# Patient Record
Sex: Male | Born: 1950
Health system: Southern US, Community
[De-identification: ages and names within clinical notes are randomized; demographics above are authoritative.]

## PROBLEM LIST (undated history)

## (undated) DIAGNOSIS — M1990 Unspecified osteoarthritis, unspecified site: Secondary | ICD-10-CM

## (undated) DIAGNOSIS — J439 Emphysema, unspecified: Secondary | ICD-10-CM

## (undated) DIAGNOSIS — C801 Malignant (primary) neoplasm, unspecified: Secondary | ICD-10-CM

## (undated) DIAGNOSIS — I4891 Unspecified atrial fibrillation: Secondary | ICD-10-CM

## (undated) DIAGNOSIS — I10 Essential (primary) hypertension: Secondary | ICD-10-CM

## (undated) DIAGNOSIS — R0600 Dyspnea, unspecified: Secondary | ICD-10-CM

## (undated) HISTORY — PX: KNEE ARTHROSCOPY: SHX127

## (undated) HISTORY — PX: TOE SURGERY: SHX1073

## (undated) HISTORY — PX: CATARACT EXTRACTION W/ INTRAOCULAR LENS IMPLANT: SHX1309

## (undated) HISTORY — PX: TONSILLECTOMY: SUR1361

---
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
## 2000-02-08 ENCOUNTER — Ambulatory Visit (HOSPITAL_COMMUNITY): Admission: RE | Admit: 2000-02-08 | Discharge: 2000-02-08 | Payer: Self-pay | Admitting: Family Medicine

## 2000-02-08 ENCOUNTER — Encounter: Payer: Self-pay | Admitting: Family Medicine

## 2007-01-22 ENCOUNTER — Encounter: Admission: RE | Admit: 2007-01-22 | Discharge: 2007-01-22 | Payer: Self-pay | Admitting: Family Medicine

## 2007-02-04 ENCOUNTER — Encounter: Admission: RE | Admit: 2007-02-04 | Discharge: 2007-02-04 | Payer: Self-pay | Admitting: Family Medicine

## 2009-05-30 ENCOUNTER — Encounter: Admission: RE | Admit: 2009-05-30 | Discharge: 2009-05-30 | Payer: Self-pay | Admitting: Family Medicine

## 2014-03-09 ENCOUNTER — Ambulatory Visit (HOSPITAL_COMMUNITY): Payer: 59 | Attending: Internal Medicine | Admitting: Cardiology

## 2014-03-09 ENCOUNTER — Other Ambulatory Visit (HOSPITAL_COMMUNITY): Payer: Self-pay | Admitting: Cardiology

## 2014-03-09 DIAGNOSIS — M79606 Pain in leg, unspecified: Secondary | ICD-10-CM

## 2014-03-09 DIAGNOSIS — M7989 Other specified soft tissue disorders: Secondary | ICD-10-CM

## 2014-03-09 DIAGNOSIS — Z720 Tobacco use: Secondary | ICD-10-CM | POA: Insufficient documentation

## 2014-03-09 DIAGNOSIS — E785 Hyperlipidemia, unspecified: Secondary | ICD-10-CM | POA: Diagnosis not present

## 2014-03-09 DIAGNOSIS — M79605 Pain in left leg: Secondary | ICD-10-CM

## 2014-03-09 DIAGNOSIS — I10 Essential (primary) hypertension: Secondary | ICD-10-CM | POA: Insufficient documentation

## 2014-03-09 NOTE — Progress Notes (Signed)
Bilateral lower venous duplex performed  

## 2017-07-28 DIAGNOSIS — Z720 Tobacco use: Secondary | ICD-10-CM

## 2017-07-28 DIAGNOSIS — I10 Essential (primary) hypertension: Secondary | ICD-10-CM | POA: Diagnosis present

## 2017-07-28 DIAGNOSIS — M1712 Unilateral primary osteoarthritis, left knee: Secondary | ICD-10-CM | POA: Diagnosis present

## 2017-07-28 DIAGNOSIS — E785 Hyperlipidemia, unspecified: Secondary | ICD-10-CM | POA: Diagnosis present

## 2017-07-28 DIAGNOSIS — Z87891 Personal history of nicotine dependence: Secondary | ICD-10-CM | POA: Diagnosis present

## 2017-07-28 NOTE — H&P (Addendum)
KNEE ARTHROPLASTY ADMISSION H&P  Patient ID: Joe Cole MRN: [id number] DOB/AGE: 1950-05-22 67 y.o.  Chief Complaint: left knee pain.  Planned Procedure Date: 08/19/2017 Medical Clearance by Dr. Kenton Kingfisher Cardiac Clearance by Dr. Wynonia Lawman   HPI: Joe Cole is a 67 y.o. male smoker with a history of HTN and HLD who presents for evaluation of OA LEFT KNEE. The patient has a history of pain and functional disability in the left knee due to arthritis and has failed non-surgical conservative treatments for greater than 12 weeks to include NSAID's and/or analgesics, corticosteriod injections, viscosupplementation injections and activity modification.  Onset of symptoms was gradual, starting 6 years ago with gradually worsening course since that time.  Patient currently rates pain at 7 out of 10 with activity. Patient has night pain, worsening of pain with activity and weight bearing and pain that interferes with activities of daily living.  Patient has evidence of subchondral cysts, subchondral sclerosis, periarticular osteophytes and joint space narrowing by imaging studies.  There is no active infection.  Past medical history: Hypertension, hyperlipidemia, current smoker.   Allergies: NKDA  Medications:  Losartan HCTZ 100-25 daily Atorvastatin 20 mg daily Amlodapine 2.5 mg daily  Social History:  Married, current every day smoker-one pack per day times 40 years.  2 beers several times per week.  Works in [employer].  Family History: Mother with HTN.  ROS: Currently denies lightheadedness, dizziness, Fever, chills, CP, SOB.   No personal history of DVT, PE, MI, or CVA. No loose teeth or dentures All other systems have been reviewed and were otherwise currently negative with the exception of those mentioned in the HPI and as above.  Objective: Vitals: Ht: 5 feet 10 inches wt: 166 temp: 98.3 BP: 167/83 pulse: 88 O2 99% on room air.   Physical Exam: General: Alert, NAD.   Antalgic Gait  HEENT: EOMI, Good Neck Extension  Pulm: No increased work of breathing.  Clear B/L A/P w/o crackle or wheeze.  CV: RRR, No m/g/r appreciated  GI: soft, NT, ND Neuro: Neuro without gross focal deficit.  Sensation intact distally Skin: No lesions in the area of chief complaint MSK/Surgical Site: Left knee w/o redness or effusion.  + JLT. ROM 5-115.  5/5 strength in extension and flexion.  +EHL/FHL.  NVI.  Stable varus and valgus stress.    Imaging Review Plain radiographs demonstrate severe degenerative joint disease of the left knee.    Preoperative templating of the joint replacement has been completed, documented, and submitted to the Operating Room personnel in order to optimize intra-operative equipment management.  Assessment: OA LEFT KNEE Principal Problem:   Primary osteoarthritis of left knee Active Problems:   Essential hypertension   Hyperlipidemia   Tobacco abuse disorder   Plan: Plan for Procedure(s): TOTAL KNEE ARTHROPLASTY  The patient history, physical exam, clinical judgement of the provider and imaging are consistent with end stage degenerative joint disease and total joint arthroplasty is deemed medically necessary. The treatment options including medical management, injection therapy, and arthroplasty were discussed at length. The risks and benefits of Procedure(s): TOTAL KNEE ARTHROPLASTY were presented and reviewed.  The risks of nonoperative treatment, versus surgical intervention including but not limited to continued pain, aseptic loosening, stiffness, dislocation/subluxation, infection, bleeding, nerve injury, blood clots, cardiopulmonary complications, morbidity, mortality, among others were discussed. The patient verbalizes understanding and wishes to proceed with the plan.  Patient is being admitted for inpatient treatment for surgery, pain control, PT, OT, prophylactic antibiotics, VTE prophylaxis, progressive ambulation,  ADL's and discharge  planning.   Dental prophylaxis discussed and recommended for 2 years postoperatively.   The patient does meet the criteria for TXA which will be used perioperatively.    ASA 325 mg will be used postoperatively for DVT prophylaxis in addition to SCDs, and early ambulation.   The patient is planning to be discharged home with home health services ([facility]) in care of his wife.   Patient's anticipated LOS is less than 2 midnights, meeting these requirements: - Lives within 1 hour of care - Has a competent adult at home to recover with post-op recover - NO history of  - Chronic pain requiring opiods  - Diabetes  - Coronary Artery Disease  - Heart failure  - Heart attack  - Stroke  - DVT/VTE  - Cardiac arrhythmia  - Respiratory Failure/COPD  - Renal failure  - Anemia  - Advanced Liver disease   Prudencio Burly III, PA-C 07/28/2017 4:01 PM

## 2017-08-05 NOTE — Pre-Procedure Instructions (Signed)
Joe Cole  08/05/2017      [id number] - [place], [name] - [address] [name] [address] Phone: [phone] Fax: [phone]    Your procedure is scheduled on Tuesday, April 23.  Report to [facility] Admitting at 5:30 AM                Your surgery or procedure is scheduled for 7:30 AM   Call this number if you have problems the morning of surgery: [phone]                 For any other questions, please call [phone], Monday - Friday 8 AM - 4 PM.     Remember:  Do not eat food or drink liquids after midnight Monday, April 22.  Take these medicines the morning of surgery with A SIP OF WATER : amLODipine (NORVASC) atorvastatin (LIPITOR)  1 Week prior to surgery STOP taking Aspirin, Aspirin Products (Goody Powder, Excedrin Migraine), Ibuprofen (Advil), Naproxen (Aleve), Vitamins and Herbal Products (ie Fish Oil).  Special instructions:   Joe Cole- Preparing For Surgery  Before surgery, you can play an important role. Because skin is not sterile, your skin needs to be as free of germs as possible. You can reduce the number of germs on your skin by washing with CHG (chlorahexidine gluconate) Soap before surgery.  CHG is an antiseptic cleaner which kills germs and bonds with the skin to continue killing germs even after washing.  Please do not use if you have an allergy to CHG or antibacterial soaps. If your skin becomes reddened/irritated stop using the CHG.  Do not shave (including legs and underarms) for at least 48 hours prior to first CHG shower. It is OK to shave your face.  Please follow these instructions carefully.   1. Shower the NIGHT BEFORE SURGERY and the MORNING OF SURGERY with CHG.   2. If you chose to wash your hair, wash your hair first as usual with your normal shampoo.  3. After you shampoo, rinse your hair and body thoroughly to remove the shampoo.                      Wash Face and genitals (private parts)  with your normal soap, then rinse.  4.Use CHG as you would any other liquid soap. You can apply CHG directly to the skin and wash gently with a scrungie or a clean washcloth.   4. Apply the CHG Soap to your body ONLY FROM THE NECK DOWN.  Do not use on open wounds or open sores. Avoid contact with your eyes, ears, mouth and genitals (private parts).   5. Wash thoroughly, paying special attention to the area where your surgery will be performed.  6. Thoroughly rinse your body with warm water from the neck down.  7. DO NOT shower/wash with your normal soap after using and rinsing off the CHG Soap.  8. Pat yourself dry with a CLEAN TOWEL.  9. Wear CLEAN PAJAMAS to bed the night before surgery, wear comfortable clothes the morning of surgery  10. Place CLEAN SHEETS on your bed the night of your first shower and DO NOT SLEEP WITH PETS.  Day of Surgery: Shower as above. Do not apply any deodorants/lotions, powders or colognes. Please wear clean clothes to the hospital/surgery center.    Do not wear jewelry, make-up or nail polish.  Do  not shave 48 hours prior to surgery.  Men may shave face and neck.  Do not bring valuables to the hospital.  [facility] is not responsible for any belongings or valuables.  Contacts, dentures or bridgework may not be worn into surgery.  Leave your suitcase in the car.  After surgery it may be brought to your room.  For patients admitted to the hospital, discharge time will be determined by your treatment team.  Patients discharged the day of surgery will not be allowed to drive home.   Please read over the following fact sheets that you were given.

## 2017-08-06 ENCOUNTER — Encounter (HOSPITAL_COMMUNITY): Payer: Self-pay

## 2017-08-06 ENCOUNTER — Encounter (HOSPITAL_COMMUNITY)
Admission: RE | Admit: 2017-08-06 | Discharge: 2017-08-06 | Disposition: A | Discharge status: Home / Self Care | Payer: Medicare Other | Source: Ambulatory Visit | Attending: Orthopedic Surgery | Admitting: Orthopedic Surgery

## 2017-08-06 ENCOUNTER — Other Ambulatory Visit: Payer: Self-pay

## 2017-08-06 DIAGNOSIS — Z01818 Encounter for other preprocedural examination: Secondary | ICD-10-CM | POA: Insufficient documentation

## 2017-08-06 DIAGNOSIS — M1712 Unilateral primary osteoarthritis, left knee: Secondary | ICD-10-CM | POA: Diagnosis not present

## 2017-08-06 HISTORY — DX: Unspecified osteoarthritis, unspecified site: M19.90

## 2017-08-06 HISTORY — DX: Essential (primary) hypertension: I10

## 2017-08-06 LAB — CBC
HEMATOCRIT: 41.2 % (ref 39.0–52.0)
HEMOGLOBIN: 14.8 g/dL (ref 13.0–17.0)
MCH: 37 pg — AB (ref 26.0–34.0)
MCHC: 35.9 g/dL (ref 30.0–36.0)
MCV: 103 fL — AB (ref 78.0–100.0)
Platelets: 198 10*3/uL (ref 150–400)
RBC: 4 MIL/uL — ABNORMAL LOW (ref 4.22–5.81)
RDW: 13 % (ref 11.5–15.5)
WBC: 4.3 10*3/uL (ref 4.0–10.5)

## 2017-08-06 LAB — BASIC METABOLIC PANEL
ANION GAP: 12 (ref 5–15)
BUN: 10 mg/dL (ref 6–20)
CHLORIDE: 93 mmol/L — AB (ref 101–111)
CO2: 24 mmol/L (ref 22–32)
Calcium: 9.7 mg/dL (ref 8.9–10.3)
Creatinine, Ser: 0.7 mg/dL (ref 0.61–1.24)
GFR calc Af Amer: >60 mL/min (ref >60)
GFR calc non Af Amer: >60 mL/min (ref >60)
GLUCOSE: 90 mg/dL (ref 65–99)
POTASSIUM: 4.8 mmol/L (ref 3.5–5.1)
Sodium: 129 mmol/L — ABNORMAL LOW (ref 135–145)

## 2017-08-06 LAB — SURGICAL PCR SCREEN
MRSA, PCR: NEGATIVE
STAPHYLOCOCCUS AUREUS: POSITIVE — AB

## 2017-08-06 NOTE — Pre-Procedure Instructions (Signed)
Joe Cole  08/06/2017      [id number] - [place], [place] - [address] [place] [address] Phone: [phone] Fax: [phone]    Your procedure is scheduled on Tuesday, April 23.  Report to [facility] Admitting at 10:30 AM                Your surgery or procedure is scheduled for 7:30 AM   Call this number if you have problems the morning of surgery: [phone]                 For any other questions, please call [phone], Monday - Friday 8 AM - 4 PM.     Remember:  Do not eat food or drink liquids after midnight Monday, April 22.  Take these medicines the morning of surgery with A SIP OF WATER : amLODipine (NORVASC)   1 Week prior to surgery STOP taking Aspirin, Aspirin Products (Goody Powder, Excedrin Migraine), Ibuprofen (Advil), Naproxen (Aleve), Vitamins and Herbal Products (ie Fish Oil).  Special instructions:   - Preparing For Surgery  Before surgery, you can play an important role. Because skin is not sterile, your skin needs to be as free of germs as possible. You can reduce the number of germs on your skin by washing with CHG (chlorahexidine gluconate) Soap before surgery.  CHG is an antiseptic cleaner which kills germs and bonds with the skin to continue killing germs even after washing.  Please do not use if you have an allergy to CHG or antibacterial soaps. If your skin becomes reddened/irritated stop using the CHG.  Do not shave (including legs and underarms) for at least 48 hours prior to first CHG shower. It is OK to shave your face.  Please follow these instructions carefully.   1. Shower the NIGHT BEFORE SURGERY and the MORNING OF SURGERY with CHG.   2. If you chose to wash your hair, wash your hair first as usual with your normal shampoo.  3. After you shampoo, rinse your hair and body thoroughly to remove the shampoo.   Wash Face and genitals (private parts)  with your normal soap, then rinse.  4.Use CHG as you would any other liquid soap. You can apply CHG directly to the skin and wash gently with a scrungie or a clean washcloth.   4. Apply the CHG Soap to your body ONLY FROM THE NECK DOWN.  Do not use on open wounds or open sores. Avoid contact with your eyes, ears, mouth and genitals (private parts).   5. Wash thoroughly, paying special attention to the area where your surgery will be performed.  6. Thoroughly rinse your body with warm water from the neck down.  7. DO NOT shower/wash with your normal soap after using and rinsing off the CHG Soap.  8. Pat yourself dry with a CLEAN TOWEL.  9. Wear CLEAN PAJAMAS to bed the night before surgery, wear comfortable clothes the morning of surgery  10. Place CLEAN SHEETS on your bed the night of your first shower and DO NOT SLEEP WITH PETS.  Day of Surgery: Shower as above. Do not apply any deodorants/lotions, powders or colognes. Please wear clean clothes to the hospital/surgery center.    Do not wear jewelry, make-up or nail polish.  Do not shave 48 hours prior to surgery.  Men may shave face and neck.  Do not bring valuables to  the hospital.  [facility] is not responsible for any belongings or valuables.  Contacts, dentures or bridgework may not be worn into surgery.  Leave your suitcase in the car.  After surgery it may be brought to your room.  For patients admitted to the hospital, discharge time will be determined by your treatment team.  Patients discharged the day of surgery will not be allowed to drive home.   Please read over the following fact sheets that you were given.

## 2017-08-06 NOTE — Progress Notes (Signed)
REQUESTED EKG FROM DR. TILLEY'S OFFICE.

## 2017-08-08 NOTE — Progress Notes (Signed)
Anesthesia Chart Review:  Pt is a 67 year old male scheduled for L total knee arthroplasty on 08/19/2017 with Edmonia Lynch, MD  - PCP is [name], MD who cleared pt for surgery.  - Saw cardiologist Tollie Eth, MD 06/23/17 for pre-op eval and was cleared for surgery without further testing.   PMH includes:  HTN, hyperlipidemia. Current smoker. BMI 22.  Medications include: amlodipine, lipitor, losartan-hctz  BP (!) 175/82   Pulse 90   Temp 36.6 C   Resp 20   Ht 6\' 1"  (1.854 m)   Wt 168 lb 1.6 oz (76.2 kg)   SpO2 100%   BMI 22.18 kg/m   Preoperative labs reviewed.   - Na 129, Cl 93.  I notified Claiborne Billings in Dr. Debroah Loop office.  Will recheck Na day of surgery.   EKG 06/23/17 (Dr. Thurman Coyer office): Sinus rhythm. Vertical axis.   If labs acceptable day of surgery, I anticipate pt can proceed with surgery as scheduled.   Willeen Cass, FNP-BC [facility] Short Stay Surgical Center/Anesthesiology Phone: [phone] 08/08/2017 1:11 PM

## 2017-08-18 MED ORDER — GABAPENTIN 300 MG PO CAPS
300.0000 mg | ORAL_CAPSULE | Freq: Once | ORAL | Status: AC
  Administered 2017-08-19: 300 mg via ORAL
  Filled 2017-08-18: qty 1

## 2017-08-18 MED ORDER — BUPIVACAINE LIPOSOME 1.3 % IJ SUSP
20.0000 mL | Freq: Once | INTRAMUSCULAR | Status: DC
  Filled 2017-08-18: qty 20

## 2017-08-18 MED ORDER — ACETAMINOPHEN 500 MG PO TABS
1000.0000 mg | ORAL_TABLET | Freq: Once | ORAL | Status: AC
  Administered 2017-08-19: 1000 mg via ORAL
  Filled 2017-08-18: qty 2

## 2017-08-18 MED ORDER — CEFAZOLIN SODIUM-DEXTROSE 2-4 GM/100ML-% IV SOLN
2.0000 g | INTRAVENOUS | Status: AC
  Administered 2017-08-19: 2 g via INTRAVENOUS
  Filled 2017-08-18: qty 100

## 2017-08-18 MED ORDER — LACTATED RINGERS IV SOLN: INTRAVENOUS | Status: DC

## 2017-08-18 MED ORDER — SODIUM CHLORIDE 0.9 % IV SOLN
1000.0000 mg | INTRAVENOUS | Status: AC
  Administered 2017-08-19: 1000 mg via INTRAVENOUS
  Filled 2017-08-18: qty 1100

## 2017-08-19 ENCOUNTER — Observation Stay (HOSPITAL_COMMUNITY): Payer: Medicare Other

## 2017-08-19 ENCOUNTER — Ambulatory Visit (HOSPITAL_COMMUNITY): Payer: Medicare Other | Admitting: Certified Registered"

## 2017-08-19 ENCOUNTER — Other Ambulatory Visit: Payer: Self-pay

## 2017-08-19 ENCOUNTER — Encounter (HOSPITAL_COMMUNITY): Payer: Self-pay | Admitting: *Deleted

## 2017-08-19 ENCOUNTER — Ambulatory Visit (HOSPITAL_COMMUNITY): Payer: Medicare Other | Admitting: Emergency Medicine

## 2017-08-19 ENCOUNTER — Encounter (HOSPITAL_COMMUNITY)
Admission: RE | Disposition: A | Discharge status: Home / Self Care | Payer: Self-pay | Source: Ambulatory Visit | Attending: Orthopedic Surgery

## 2017-08-19 ENCOUNTER — Observation Stay (HOSPITAL_COMMUNITY)
Admission: RE | Admit: 2017-08-19 | Discharge: 2017-08-20 | Disposition: A | Discharge status: Home / Self Care | Payer: Medicare Other | Source: Ambulatory Visit | Attending: Orthopedic Surgery | Admitting: Orthopedic Surgery

## 2017-08-19 DIAGNOSIS — E785 Hyperlipidemia, unspecified: Secondary | ICD-10-CM | POA: Diagnosis not present

## 2017-08-19 DIAGNOSIS — I10 Essential (primary) hypertension: Secondary | ICD-10-CM | POA: Diagnosis not present

## 2017-08-19 DIAGNOSIS — F17210 Nicotine dependence, cigarettes, uncomplicated: Secondary | ICD-10-CM | POA: Diagnosis not present

## 2017-08-19 DIAGNOSIS — Z720 Tobacco use: Secondary | ICD-10-CM

## 2017-08-19 DIAGNOSIS — M1712 Unilateral primary osteoarthritis, left knee: Principal | ICD-10-CM | POA: Insufficient documentation

## 2017-08-19 DIAGNOSIS — Z96659 Presence of unspecified artificial knee joint: Secondary | ICD-10-CM

## 2017-08-19 DIAGNOSIS — Z79899 Other long term (current) drug therapy: Secondary | ICD-10-CM | POA: Insufficient documentation

## 2017-08-19 DIAGNOSIS — Z87891 Personal history of nicotine dependence: Secondary | ICD-10-CM | POA: Diagnosis present

## 2017-08-19 HISTORY — PX: TOTAL KNEE ARTHROPLASTY: SHX125

## 2017-08-19 LAB — POCT I-STAT 4, (NA,K, GLUC, HGB,HCT)
Glucose, Bld: 102 mg/dL — ABNORMAL HIGH (ref 65–99)
HEMATOCRIT: 41 % (ref 39.0–52.0)
HEMOGLOBIN: 13.9 g/dL (ref 13.0–17.0)
Potassium: 4.2 mmol/L (ref 3.5–5.1)
Sodium: 132 mmol/L — ABNORMAL LOW (ref 135–145)

## 2017-08-19 LAB — GLUCOSE, CAPILLARY: Glucose-Capillary: 156 mg/dL — ABNORMAL HIGH (ref 65–99)

## 2017-08-19 SURGERY — ARTHROPLASTY, KNEE, TOTAL
Anesthesia: Monitor Anesthesia Care | Site: Knee | Laterality: Left

## 2017-08-19 MED ORDER — SUCCINYLCHOLINE CHLORIDE 200 MG/10ML IV SOSY
PREFILLED_SYRINGE | INTRAVENOUS | Status: AC
  Filled 2017-08-19: qty 10

## 2017-08-19 MED ORDER — METHOCARBAMOL 500 MG PO TABS: 500.0000 mg | ORAL_TABLET | Freq: Four times a day (QID) | ORAL | 0 refills | Status: DC | PRN

## 2017-08-19 MED ORDER — CEFAZOLIN SODIUM-DEXTROSE 2-4 GM/100ML-% IV SOLN
2.0000 g | Freq: Four times a day (QID) | INTRAVENOUS | Status: AC
  Administered 2017-08-19 (×2): 2 g via INTRAVENOUS
  Filled 2017-08-19 (×2): qty 100

## 2017-08-19 MED ORDER — METOCLOPRAMIDE HCL 5 MG PO TABS: 5.0000 mg | ORAL_TABLET | Freq: Three times a day (TID) | ORAL | Status: DC | PRN

## 2017-08-19 MED ORDER — LIDOCAINE 2% (20 MG/ML) 5 ML SYRINGE
INTRAMUSCULAR | Status: AC
  Filled 2017-08-19: qty 5

## 2017-08-19 MED ORDER — METHOCARBAMOL 1000 MG/10ML IJ SOLN
500.0000 mg | Freq: Four times a day (QID) | INTRAVENOUS | Status: DC | PRN
  Filled 2017-08-19: qty 5

## 2017-08-19 MED ORDER — DEXAMETHASONE SODIUM PHOSPHATE 10 MG/ML IJ SOLN
INTRAMUSCULAR | Status: AC
  Filled 2017-08-19: qty 1

## 2017-08-19 MED ORDER — ONDANSETRON HCL 4 MG PO TABS: 4.0000 mg | ORAL_TABLET | Freq: Three times a day (TID) | ORAL | 0 refills | Status: DC | PRN

## 2017-08-19 MED ORDER — SUGAMMADEX SODIUM 200 MG/2ML IV SOLN
INTRAVENOUS | Status: AC
  Filled 2017-08-19: qty 2

## 2017-08-19 MED ORDER — SODIUM CHLORIDE 0.9 % IR SOLN
Status: DC | PRN
  Administered 2017-08-19: 3000 mL

## 2017-08-19 MED ORDER — DIPHENHYDRAMINE HCL 12.5 MG/5ML PO ELIX: 12.5000 mg | ORAL_SOLUTION | ORAL | Status: DC | PRN

## 2017-08-19 MED ORDER — MAGNESIUM CITRATE PO SOLN: 1.0000 | Freq: Once | ORAL | Status: DC | PRN

## 2017-08-19 MED ORDER — PHENYLEPHRINE 40 MCG/ML (10ML) SYRINGE FOR IV PUSH (FOR BLOOD PRESSURE SUPPORT)
PREFILLED_SYRINGE | INTRAVENOUS | Status: AC
  Filled 2017-08-19: qty 10

## 2017-08-19 MED ORDER — BUPIVACAINE LIPOSOME 1.3 % IJ SUSP
INTRAMUSCULAR | Status: DC | PRN
  Administered 2017-08-19: 20 mL

## 2017-08-19 MED ORDER — OMEPRAZOLE 20 MG PO CPDR: 20.0000 mg | DELAYED_RELEASE_CAPSULE | Freq: Every day | ORAL | 0 refills | Status: DC

## 2017-08-19 MED ORDER — EPHEDRINE 5 MG/ML INJ
INTRAVENOUS | Status: AC
  Filled 2017-08-19: qty 10

## 2017-08-19 MED ORDER — SODIUM CHLORIDE 0.9% FLUSH
INTRAVENOUS | Status: DC | PRN
  Administered 2017-08-19: 30 mL

## 2017-08-19 MED ORDER — PROPOFOL 10 MG/ML IV BOLUS
INTRAVENOUS | Status: AC
  Filled 2017-08-19: qty 20

## 2017-08-19 MED ORDER — CHLORHEXIDINE GLUCONATE 4 % EX LIQD: 60.0000 mL | Freq: Once | CUTANEOUS | Status: DC

## 2017-08-19 MED ORDER — BUPIVACAINE IN DEXTROSE 0.75-8.25 % IT SOLN
INTRATHECAL | Status: DC | PRN
  Administered 2017-08-19: 2 mL via INTRATHECAL

## 2017-08-19 MED ORDER — FENTANYL CITRATE (PF) 100 MCG/2ML IJ SOLN: 50.0000 ug | Freq: Once | INTRAMUSCULAR | Status: DC

## 2017-08-19 MED ORDER — LOSARTAN POTASSIUM-HCTZ 100-25 MG PO TABS: 1.0000 | ORAL_TABLET | Freq: Every day | ORAL | Status: DC

## 2017-08-19 MED ORDER — ONDANSETRON HCL 4 MG/2ML IJ SOLN
INTRAMUSCULAR | Status: AC
  Filled 2017-08-19: qty 2

## 2017-08-19 MED ORDER — MENTHOL 3 MG MT LOZG: 1.0000 | LOZENGE | OROMUCOSAL | Status: DC | PRN

## 2017-08-19 MED ORDER — OXYCODONE HCL 5 MG PO TABS
5.0000 mg | ORAL_TABLET | ORAL | Status: DC | PRN
  Administered 2017-08-19 – 2017-08-20 (×5): 10 mg via ORAL
  Filled 2017-08-19 (×5): qty 2

## 2017-08-19 MED ORDER — LOSARTAN POTASSIUM 50 MG PO TABS
100.0000 mg | ORAL_TABLET | Freq: Every day | ORAL | Status: DC
  Administered 2017-08-19 – 2017-08-20 (×2): 100 mg via ORAL
  Filled 2017-08-19 (×2): qty 2

## 2017-08-19 MED ORDER — ROPIVACAINE HCL 7.5 MG/ML IJ SOLN
INTRAMUSCULAR | Status: DC | PRN
  Administered 2017-08-19: 20 mL via PERINEURAL

## 2017-08-19 MED ORDER — POLYETHYLENE GLYCOL 3350 17 G PO PACK: 17.0000 g | PACK | Freq: Every day | ORAL | Status: DC | PRN

## 2017-08-19 MED ORDER — ROCURONIUM BROMIDE 10 MG/ML (PF) SYRINGE
PREFILLED_SYRINGE | INTRAVENOUS | Status: AC
Start: 2017-08-19 — End: 2017-08-19
  Filled 2017-08-19: qty 5

## 2017-08-19 MED ORDER — FENTANYL CITRATE (PF) 250 MCG/5ML IJ SOLN
INTRAMUSCULAR | Status: AC
  Filled 2017-08-19: qty 5

## 2017-08-19 MED ORDER — AMLODIPINE BESYLATE 2.5 MG PO TABS
2.5000 mg | ORAL_TABLET | Freq: Every day | ORAL | Status: DC
  Administered 2017-08-20: 2.5 mg via ORAL
  Filled 2017-08-19: qty 1

## 2017-08-19 MED ORDER — ACETAMINOPHEN 500 MG PO TABS
1000.0000 mg | ORAL_TABLET | Freq: Three times a day (TID) | ORAL | Status: DC
  Administered 2017-08-19 – 2017-08-20 (×3): 1000 mg via ORAL
  Filled 2017-08-19 (×3): qty 2

## 2017-08-19 MED ORDER — FENTANYL CITRATE (PF) 100 MCG/2ML IJ SOLN
50.0000 ug | Freq: Once | INTRAMUSCULAR | Status: AC
  Administered 2017-08-19: 50 ug via INTRAVENOUS

## 2017-08-19 MED ORDER — LACTATED RINGERS IV SOLN
INTRAVENOUS | Status: DC
  Administered 2017-08-19: 08:00:00 via INTRAVENOUS

## 2017-08-19 MED ORDER — DEXAMETHASONE SODIUM PHOSPHATE 10 MG/ML IJ SOLN: 10.0000 mg | Freq: Once | INTRAMUSCULAR | Status: DC

## 2017-08-19 MED ORDER — ACETAMINOPHEN 325 MG PO TABS: 325.0000 mg | ORAL_TABLET | Freq: Four times a day (QID) | ORAL | Status: DC | PRN

## 2017-08-19 MED ORDER — POVIDONE-IODINE 10 % EX SWAB
2.0000 "application " | Freq: Once | CUTANEOUS | Status: AC
  Administered 2017-08-19: 2 via TOPICAL

## 2017-08-19 MED ORDER — SORBITOL 70 % SOLN: 30.0000 mL | Freq: Every day | Status: DC | PRN

## 2017-08-19 MED ORDER — FENTANYL CITRATE (PF) 100 MCG/2ML IJ SOLN
INTRAMUSCULAR | Status: AC
  Administered 2017-08-19: 50 ug via INTRAVENOUS
  Filled 2017-08-19: qty 2

## 2017-08-19 MED ORDER — ONDANSETRON HCL 4 MG/2ML IJ SOLN: 4.0000 mg | Freq: Four times a day (QID) | INTRAMUSCULAR | Status: DC | PRN

## 2017-08-19 MED ORDER — HYDROCHLOROTHIAZIDE 25 MG PO TABS
25.0000 mg | ORAL_TABLET | Freq: Every day | ORAL | Status: DC
  Administered 2017-08-19 – 2017-08-20 (×2): 25 mg via ORAL
  Filled 2017-08-19 (×2): qty 1

## 2017-08-19 MED ORDER — PHENOL 1.4 % MT LIQD: 1.0000 | OROMUCOSAL | Status: DC | PRN

## 2017-08-19 MED ORDER — HYDROMORPHONE HCL 2 MG/ML IJ SOLN: 0.5000 mg | INTRAMUSCULAR | Status: DC | PRN

## 2017-08-19 MED ORDER — METOCLOPRAMIDE HCL 5 MG/ML IJ SOLN: 5.0000 mg | Freq: Three times a day (TID) | INTRAMUSCULAR | Status: DC | PRN

## 2017-08-19 MED ORDER — PROPOFOL 500 MG/50ML IV EMUL
INTRAVENOUS | Status: DC | PRN
  Administered 2017-08-19: 75 ug/kg/min via INTRAVENOUS

## 2017-08-19 MED ORDER — ACETAMINOPHEN 500 MG PO TABS: 1000.0000 mg | ORAL_TABLET | Freq: Three times a day (TID) | ORAL | 0 refills | Status: AC

## 2017-08-19 MED ORDER — DOCUSATE SODIUM 100 MG PO CAPS
100.0000 mg | ORAL_CAPSULE | Freq: Two times a day (BID) | ORAL | Status: DC
  Administered 2017-08-19 – 2017-08-20 (×3): 100 mg via ORAL
  Filled 2017-08-19 (×3): qty 1

## 2017-08-19 MED ORDER — CELECOXIB 200 MG PO CAPS
200.0000 mg | ORAL_CAPSULE | Freq: Two times a day (BID) | ORAL | Status: DC
  Administered 2017-08-19 – 2017-08-20 (×3): 200 mg via ORAL
  Filled 2017-08-19 (×3): qty 1

## 2017-08-19 MED ORDER — ASPIRIN EC 325 MG PO TBEC
325.0000 mg | DELAYED_RELEASE_TABLET | Freq: Every day | ORAL | Status: DC
  Administered 2017-08-20: 325 mg via ORAL
  Filled 2017-08-19: qty 1

## 2017-08-19 MED ORDER — MIDAZOLAM HCL 2 MG/2ML IJ SOLN
INTRAMUSCULAR | Status: AC
  Filled 2017-08-19: qty 2

## 2017-08-19 MED ORDER — MIDAZOLAM HCL 2 MG/2ML IJ SOLN
INTRAMUSCULAR | Status: AC
  Administered 2017-08-19: 2 mg via INTRAVENOUS
  Filled 2017-08-19: qty 2

## 2017-08-19 MED ORDER — DOCUSATE SODIUM 100 MG PO CAPS: 100.0000 mg | ORAL_CAPSULE | Freq: Two times a day (BID) | ORAL | 0 refills | Status: DC

## 2017-08-19 MED ORDER — ASPIRIN EC 325 MG PO TBEC: 325.0000 mg | DELAYED_RELEASE_TABLET | Freq: Every day | ORAL | 0 refills | Status: DC

## 2017-08-19 MED ORDER — OXYCODONE HCL 5 MG PO TABS: 5.0000 mg | ORAL_TABLET | ORAL | 0 refills | Status: AC | PRN

## 2017-08-19 MED ORDER — LACTATED RINGERS IV SOLN
INTRAVENOUS | Status: DC
  Administered 2017-08-19: 14:00:00 via INTRAVENOUS
  Administered 2017-08-19: 125 mL via INTRAVENOUS
  Administered 2017-08-20: 06:00:00 via INTRAVENOUS

## 2017-08-19 MED ORDER — MIDAZOLAM HCL 2 MG/2ML IJ SOLN
2.0000 mg | Freq: Once | INTRAMUSCULAR | Status: AC
  Administered 2017-08-19: 2 mg via INTRAVENOUS

## 2017-08-19 MED ORDER — ATORVASTATIN CALCIUM 20 MG PO TABS
20.0000 mg | ORAL_TABLET | Freq: Every day | ORAL | Status: DC
  Administered 2017-08-19 – 2017-08-20 (×2): 20 mg via ORAL
  Filled 2017-08-19 (×2): qty 1

## 2017-08-19 MED ORDER — CELECOXIB 200 MG PO CAPS: 200.0000 mg | ORAL_CAPSULE | Freq: Two times a day (BID) | ORAL | 0 refills | Status: DC

## 2017-08-19 MED ORDER — ONDANSETRON HCL 4 MG/2ML IJ SOLN
INTRAMUSCULAR | Status: DC | PRN
  Administered 2017-08-19: 4 mg via INTRAVENOUS

## 2017-08-19 MED ORDER — ONDANSETRON HCL 4 MG PO TABS: 4.0000 mg | ORAL_TABLET | Freq: Four times a day (QID) | ORAL | Status: DC | PRN

## 2017-08-19 MED ORDER — METHOCARBAMOL 500 MG PO TABS
500.0000 mg | ORAL_TABLET | Freq: Four times a day (QID) | ORAL | Status: DC | PRN
  Administered 2017-08-19 – 2017-08-20 (×4): 500 mg via ORAL
  Filled 2017-08-19 (×4): qty 1

## 2017-08-19 MED ORDER — 0.9 % SODIUM CHLORIDE (POUR BTL) OPTIME
TOPICAL | Status: DC | PRN
  Administered 2017-08-19: 1000 mL

## 2017-08-19 SURGICAL SUPPLY — 61 items
BANDAGE ESMARK 6X9 LF (GAUZE/BANDAGES/DRESSINGS) ×1 IMPLANT
BEARIN INSERT TIBIAL SZ6 9 (Orthopedic Implant) ×2 IMPLANT
BEARING INSERT TIBIAL SZ6 9 (Orthopedic Implant) IMPLANT
BLADE SAG 18X100X1.27 (BLADE) ×4 IMPLANT
BNDG CMPR 9X6 STRL LF SNTH (GAUZE/BANDAGES/DRESSINGS) ×1
BNDG CMPR MED 10X6 ELC LF (GAUZE/BANDAGES/DRESSINGS) ×1
BNDG CMPR MED 15X6 ELC VLCR LF (GAUZE/BANDAGES/DRESSINGS) ×1
BNDG ELASTIC 6X10 VLCR STRL LF (GAUZE/BANDAGES/DRESSINGS) ×2 IMPLANT
BNDG ELASTIC 6X15 VLCR STRL LF (GAUZE/BANDAGES/DRESSINGS) ×2 IMPLANT
BNDG ESMARK 6X9 LF (GAUZE/BANDAGES/DRESSINGS) ×2
BOWL SMART MIX CTS (DISPOSABLE) ×2 IMPLANT
BSPLAT TIB 6 KN TRITANIUM (Knees) ×1 IMPLANT
CLSR STERI-STRIP ANTIMIC 1/2X4 (GAUZE/BANDAGES/DRESSINGS) ×4 IMPLANT
COVER SURGICAL LIGHT HANDLE (MISCELLANEOUS) ×2 IMPLANT
CUFF TOURNIQUET SINGLE 34IN LL (TOURNIQUET CUFF) ×2 IMPLANT
DRAPE EXTREMITY T 121X128X90 (DRAPE) ×2 IMPLANT
DRAPE HALF SHEET 40X57 (DRAPES) ×2 IMPLANT
DRAPE IMP U-DRAPE 54X76 (DRAPES) ×2 IMPLANT
DRAPE U-SHAPE 47X51 STRL (DRAPES) ×2 IMPLANT
DRSG MEPILEX BORDER 4X12 (GAUZE/BANDAGES/DRESSINGS) ×2 IMPLANT
DRSG MEPILEX BORDER 4X8 (GAUZE/BANDAGES/DRESSINGS) ×2 IMPLANT
DURAPREP 26ML APPLICATOR (WOUND CARE) ×4 IMPLANT
ELECT CAUTERY BLADE 6.4 (BLADE) ×2 IMPLANT
ELECT REM PT RETURN 9FT ADLT (ELECTROSURGICAL) ×2
ELECTRODE REM PT RTRN 9FT ADLT (ELECTROSURGICAL) ×1 IMPLANT
FACESHIELD WRAPAROUND (MASK) ×4 IMPLANT
FACESHIELD WRAPAROUND OR TEAM (MASK) ×2 IMPLANT
FEMORAL POSTERIOR SZ6 LFT (Femur) IMPLANT
GLOVE BIO SURGEON STRL SZ7.5 (GLOVE) ×4 IMPLANT
GLOVE BIOGEL PI IND STRL 8 (GLOVE) ×2 IMPLANT
GLOVE BIOGEL PI INDICATOR 8 (GLOVE) ×2
GOWN STRL REUS W/ TWL LRG LVL3 (GOWN DISPOSABLE) ×2 IMPLANT
GOWN STRL REUS W/ TWL XL LVL3 (GOWN DISPOSABLE) ×1 IMPLANT
GOWN STRL REUS W/TWL LRG LVL3 (GOWN DISPOSABLE) ×4
GOWN STRL REUS W/TWL XL LVL3 (GOWN DISPOSABLE) ×2
HANDPIECE INTERPULSE COAX TIP (DISPOSABLE) ×2
IMMOBILIZER KNEE 22 UNIV (SOFTGOODS) ×2 IMPLANT
KIT BASIN OR (CUSTOM PROCEDURE TRAY) ×2 IMPLANT
KIT TURNOVER KIT B (KITS) ×2 IMPLANT
KNEE PATELLA ASYMMETRIC 10X32 (Knees) ×1 IMPLANT
KNEE TIBIAL COMPONENT SZ6 (Knees) ×1 IMPLANT
MANIFOLD NEPTUNE II (INSTRUMENTS) ×2 IMPLANT
NEEDLE 22X1 1/2 (OR ONLY) (NEEDLE) ×2 IMPLANT
NS IRRIG 1000ML POUR BTL (IV SOLUTION) ×2 IMPLANT
PACK TOTAL JOINT (CUSTOM PROCEDURE TRAY) ×2 IMPLANT
PAD ARMBOARD 7.5X6 YLW CONV (MISCELLANEOUS) ×2 IMPLANT
POSTERIOR FEMORAL SZ6 LFT (Femur) ×2 IMPLANT
SET HNDPC FAN SPRY TIP SCT (DISPOSABLE) IMPLANT
SUCTION FRAZIER HANDLE 10FR (MISCELLANEOUS) ×1
SUCTION TUBE FRAZIER 10FR DISP (MISCELLANEOUS) ×1 IMPLANT
SUT MNCRL AB 4-0 PS2 18 (SUTURE) ×2 IMPLANT
SUT MON AB 2-0 CT1 27 (SUTURE) ×2 IMPLANT
SUT VIC AB 0 CT1 27 (SUTURE) ×2
SUT VIC AB 0 CT1 27XBRD ANBCTR (SUTURE) ×1 IMPLANT
SUT VIC AB 1 CT1 27 (SUTURE) ×4
SUT VIC AB 1 CT1 27XBRD ANBCTR (SUTURE) ×2 IMPLANT
SYR 50ML LL SCALE MARK (SYRINGE) ×2 IMPLANT
TOWEL OR 17X24 6PK STRL BLUE (TOWEL DISPOSABLE) ×2 IMPLANT
TOWEL OR 17X26 10 PK STRL BLUE (TOWEL DISPOSABLE) ×2 IMPLANT
TRAY CATH 16FR W/PLASTIC CATH (SET/KITS/TRAYS/PACK) ×1 IMPLANT
TRAY FOLEY BAG SILVER LF 14FR (CATHETERS) IMPLANT

## 2017-08-19 NOTE — Progress Notes (Signed)
Orthopedic Tech Progress Note Patient Details:  Joe Cole 09-Apr-1951 [id number]  CPM Left Knee CPM Left Knee: On Left Knee Flexion (Degrees): 80 Left Knee Extension (Degrees): 0  Post Interventions Patient Tolerated: Well Instructions Provided: Care of device  Hildred Priest 08/19/2017, 2:27 PM Placed pt's lle on cpm @ 0-80 degrees @1425 ; RN notified ; pt will increase as tolerated; RN notified

## 2017-08-19 NOTE — Discharge Instructions (Signed)

## 2017-08-19 NOTE — Anesthesia Procedure Notes (Signed)
Spinal  Patient location during procedure: OR Start time: 08/19/2017 9:04 AM End time: 08/19/2017 9:09 AM Staffing Anesthesiologist: Oleta Mouse, MD Preanesthetic Checklist Completed: patient identified, surgical consent, pre-op evaluation, timeout performed, IV checked, risks and benefits discussed and monitors and equipment checked Spinal Block Patient position: sitting Prep: site prepped and draped and DuraPrep Patient monitoring: heart rate, cardiac monitor, continuous pulse ox and blood pressure Approach: midline Location: L4-5 Injection technique: single-shot Needle Needle type: Pencan  Needle gauge: 24 G Needle length: 10 cm Assessment Sensory level: T4

## 2017-08-19 NOTE — Progress Notes (Signed)
RN was helping pt to bathroom when pt started to become unsteady, so RN sat pt on bed where he became unresponsive and clammy. RN did sternal rub and laid pt down. Rapid was called. Pt began to come back to, answering questions appropriately and stating that he "feels better." BP was 141/83, HR 71, 02 sating at 95% on room air. Roxan Hockey PA made aware, instructed to check CBG, CBG checked and was 156 and to check BP before ambulating again. Pt is now resting comfortably in bed. Wife in room and consoled by nurse. Will continue to monitor.

## 2017-08-19 NOTE — Interval H&P Note (Signed)
History and Physical Interval Note:  08/19/2017 7:20 AM  Joe Cole  has presented today for surgery, with the diagnosis of OA LEFT KNEE  The various methods of treatment have been discussed with the patient and family. After consideration of risks, benefits and other options for treatment, the patient has consented to  Procedure(s): TOTAL KNEE ARTHROPLASTY (Left) as a surgical intervention .  The patient's history has been reviewed, patient examined, no change in status, stable for surgery.  I have reviewed the patient's chart and labs.  Questions were answered to the patient's satisfaction.     [name] D

## 2017-08-19 NOTE — Anesthesia Procedure Notes (Signed)
Procedure Name: MAC Date/Time: 08/19/2017 9:05 AM Performed by: Leonor Liv, CRNA Pre-anesthesia Checklist: Emergency Drugs available, Suction available, Patient being monitored, Timeout performed and Patient identified Oxygen Delivery Method: Nasal cannula Placement Confirmation: positive ETCO2

## 2017-08-19 NOTE — Transfer of Care (Signed)
Immediate Anesthesia Transfer of Care Note  Patient: Joe Cole  Procedure(s) Performed: TOTAL KNEE ARTHROPLASTY (Left Knee)  Patient Location: PACU  Anesthesia Type:Spinal  Level of Consciousness: awake, alert  and oriented  Airway & Oxygen Therapy: Patient Spontanous Breathing and Patient connected to nasal cannula oxygen  Post-op Assessment: Report given to RN, Post -op Vital signs reviewed and stable and Patient moving all extremities  Post vital signs: Reviewed and stable  Last Vitals:  Vitals Value Taken Time  BP 144/89 08/19/2017 11:25 AM  Temp    Pulse 80 08/19/2017 11:27 AM  Resp 16 08/19/2017 11:27 AM  SpO2 100 % 08/19/2017 11:27 AM  Vitals shown include unvalidated device data.  Last Pain:  Vitals:   08/19/17 0735  TempSrc:   PainSc: 7       Patients Stated Pain Goal: 3 (16/24/46 9507)  Complications: No apparent anesthesia complications

## 2017-08-19 NOTE — Anesthesia Procedure Notes (Signed)
Anesthesia Regional Block: Adductor canal block   Pre-Anesthetic Checklist: ,, timeout performed, Correct Patient, Correct Site, Correct Laterality, Correct Procedure, Correct Position, site marked, Risks and benefits discussed,  Surgical consent,  Pre-op evaluation,  At surgeon's request and post-op pain management  Laterality: Lower and Left  Prep: chloraprep       Needles:  Injection technique: Single-shot  Needle Type: Echogenic Stimulator Needle          Additional Needles:   Procedures:,,,, ultrasound used (permanent image in chart),,,,  Narrative:  Start time: 08/19/2017 8:14 AM End time: 08/19/2017 8:17 AM Injection made incrementally with aspirations every 5 mL.  Performed by: Personally  Anesthesiologist: Oleta Mouse, MD  Additional Notes: H+P and labs reviewed, risks and benefits discussed with patient, procedure tolerated well without complications

## 2017-08-19 NOTE — Op Note (Signed)
DATE OF SURGERY:  08/19/2017 TIME: 10:21 AM  PATIENT NAME:  Joe Cole   AGE: 67 y.o.    PRE-OPERATIVE DIAGNOSIS:  OA LEFT KNEE  POST-OPERATIVE DIAGNOSIS:  Same  PROCEDURE:  Procedure(s): TOTAL KNEE ARTHROPLASTY   SURGEON:  MURPHY, TIMOTHY D, MD   ASSISTANT:  Roxan Hockey, PA-C, he was present and scrubbed throughout the case, critical for completion in a timely fashion, and for retraction, instrumentation, and closure.    OPERATIVE IMPLANTS: Stryker Triathlon Posterior Stabilized. Press fit knee  Femur size 6, Tibia size 6, Patella size 32 3-peg oval button, with a 9 mm polyethylene insert.   PREOPERATIVE INDICATIONS:  Joe Cole is a 67 y.o. year old male with end stage bone on bone degenerative arthritis of the knee who failed conservative treatment, including injections, antiinflammatories, activity modification, and assistive devices, and had significant impairment of their activities of daily living, and elected for Total Knee Arthroplasty.   The risks, benefits, and alternatives were discussed at length including but not limited to the risks of infection, bleeding, nerve injury, stiffness, blood clots, the need for revision surgery, cardiopulmonary complications, among others, and they were willing to proceed.   OPERATIVE DESCRIPTION:  The patient was brought to the operative room and placed in a supine position.  General anesthesia was administered.  IV antibiotics were given.  The lower extremity was prepped and draped in the usual sterile fashion.  Time out was performed.  The leg was elevated and exsanguinated and the tourniquet was inflated.  Anterior approach was performed.  The patella was everted and osteophytes were removed.  The anterior horn of the medial and lateral meniscus was removed.   The distal femur was opened with the drill and the intramedullary distal femoral cutting jig was utilized, set at 5 degrees resecting 8 mm off the distal femur.   Care was taken to protect the collateral ligaments.  The distal femoral sizing jig was applied, taking care to avoid notching.  Then the 4-in-1 cutting jig was applied and the anterior and posterior femur was cut, along with the chamfer cuts.  All posterior osteophytes were removed.  The flexion gap was then measured and was symmetric with the extension gap.  Then the extramedullary tibial cutting jig was utilized making the appropriate cut using the anterior tibial crest as a reference building in appropriate posterior slope.  Care was taken during the cut to protect the medial and collateral ligaments.  The proximal tibia was removed along with the posterior horns of the menisci.  The PCL was sacrificed.    The extensor gap was measured and was approximately 49mm.    I completed the distal femoral preparation using the appropriate jig to prepare the box.  The patella was then measured, and cut with the saw.    The proximal tibia sized and prepared accordingly with the reamer and the punch, and then all components were trialed with the above sized poly insert.  The knee was found to have excellent balance and full motion.    The above named components were then impacted into place and Poly tibial piece and patella were inserted.  I was very happy with his stability and ROM  I performed a periarticular injection with marcaine and toradol  The knee was easily taken through a range of motion and the patella tracked well and the knee irrigated copiously and the parapatellar and subcutaneous tissue closed with vicryl, and monocryl with steri strips for the skin.  The incision was dressed with sterile gauze and the tourniquet released and the patient was awakened and returned to the PACU in stable and satisfactory condition.  There were no complications.  Total tourniquet time was roughly 60 minutes.   POSTOPERATIVE PLAN: post op Abx, DVT px: SCD's, TED's, Early ambulation and chemical  px

## 2017-08-19 NOTE — Anesthesia Preprocedure Evaluation (Signed)
Anesthesia Evaluation  Patient identified by MRN, date of birth, ID band Patient awake    Reviewed: Allergy & Precautions, NPO status , Patient's Chart, lab work & pertinent test results  History of Anesthesia Complications Negative for: history of anesthetic complications  Airway Mallampati: II  TM Distance: >3 FB Neck ROM: Full    Dental  (+) Loose, Poor Dentition,    Pulmonary neg shortness of breath, neg COPD, neg recent URI, Current Smoker,    breath sounds clear to auscultation       Cardiovascular hypertension, Pt. on medications (-) angina(-) Past MI and (-) CHF  Rhythm:Regular     Neuro/Psych negative neurological ROS  negative psych ROS   GI/Hepatic negative GI ROS, Neg liver ROS,   Endo/Other    Renal/GU negative Renal ROS     Musculoskeletal  (+) Arthritis ,   Abdominal   Peds  Hematology negative hematology ROS (+)   Anesthesia Other Findings   Reproductive/Obstetrics                             Anesthesia Physical Anesthesia Plan  ASA: II  Anesthesia Plan: MAC, Regional and Spinal   Post-op Pain Management:    Induction:   PONV Risk Score and Plan: 0  Airway Management Planned: Nasal Cannula  Additional Equipment: None  Intra-op Plan:   Post-operative Plan:   Informed Consent: I have reviewed the patients History and Physical, chart, labs and discussed the procedure including the risks, benefits and alternatives for the proposed anesthesia with the patient or authorized representative who has indicated his/her understanding and acceptance.   Dental advisory given  Plan Discussed with: CRNA and Surgeon  Anesthesia Plan Comments:         Anesthesia Quick Evaluation

## 2017-08-19 NOTE — Evaluation (Signed)
Physical Therapy Evaluation Patient Details Name: Joe Cole MRN: [id number] DOB: 29-Jul-1950 Today's Date: 08/19/2017   History of Present Illness  Pt is a 67 y/o male s/p elective L TKA. PMH includes HTN and tobacco use.   Clinical Impression  Pt is s/p surgery above with deficits below. Gait distance limited secondary to pain. Required min A for mobility with RW. Reviewed knee precautions and supine HEP. Will continue to follow acutely to maximize functional mobility independence and safety.     Follow Up Recommendations Follow surgeon's recommendation for DC plan and follow-up therapies;Supervision for mobility/OOB    Equipment Recommendations  None recommended by PT    Recommendations for Other Services OT consult     Precautions / Restrictions Precautions Precautions: Knee Precaution Booklet Issued: Yes (comment) Precaution Comments: Reviewed supine HEP And knee precautions with pt.  Restrictions Weight Bearing Restrictions: Yes LLE Weight Bearing: Weight bearing as tolerated      Mobility  Bed Mobility Overal bed mobility: Needs Assistance Bed Mobility: Supine to Sit     Supine to sit: Supervision     General bed mobility comments: Supervision for safety.   Transfers Overall transfer level: Needs assistance Equipment used: Rolling walker (2 wheeled) Transfers: Sit to/from Stand Sit to Stand: Min assist         General transfer comment: Min A for lift assist and steadying assist. Verbal cues for safe hand placement.   Ambulation/Gait Ambulation/Gait assistance: Min assist Ambulation Distance (Feet): 5 Feet Assistive device: Rolling walker (2 wheeled) Gait Pattern/deviations: Step-to pattern;Decreased step length - right;Decreased step length - left;Decreased weight shift to left;Antalgic Gait velocity: Decreased  Gait velocity interpretation: <1.31 ft/sec, indicative of household ambulator General Gait Details: Slow, antalgic gait. Pt also with  slight knee buckling even with use of KI. Distance limited to chair secondary to pain. Verbal cues for sequencing using RW.   Stairs            Wheelchair Mobility    Modified Rankin (Stroke Patients Only)       Balance Overall balance assessment: Needs assistance Sitting-balance support: No upper extremity supported;Feet supported Sitting balance-Leahy Scale: Good     Standing balance support: Bilateral upper extremity supported;During functional activity Standing balance-Leahy Scale: Poor Standing balance comment: Reliant on BUE support.                              Pertinent Vitals/Pain Pain Assessment: 0-10 Pain Score: 4  Pain Location: L knee  Pain Descriptors / Indicators: Aching;Operative site guarding Pain Intervention(s): Limited activity within patient's tolerance;Monitored during session;Repositioned    Home Living Family/patient expects to be discharged to:: Private residence Living Arrangements: Spouse/significant other Available Help at Discharge: Family;Available 24 hours/day Type of Home: House Home Access: Stairs to enter Entrance Stairs-Rails: Right Entrance Stairs-Number of Steps: 4 Home Layout: One level Home Equipment: Walker - 2 wheels;Bedside commode      Prior Function Level of Independence: Independent               Hand Dominance        Extremity/Trunk Assessment   Upper Extremity Assessment Upper Extremity Assessment: Defer to OT evaluation    Lower Extremity Assessment Lower Extremity Assessment: LLE deficits/detail LLE Deficits / Details: Reports some decreased sensation. Able to perform ther ex below. Deficits consistent with post op pain and weakness.     Cervical / Trunk Assessment Cervical / Trunk Assessment: Normal  Communication  Communication: No difficulties  Cognition Arousal/Alertness: Awake/alert Behavior During Therapy: WFL for tasks assessed/performed Overall Cognitive Status: Within  Functional Limits for tasks assessed                                        General Comments General comments (skin integrity, edema, etc.): Pt's wife present during session.     Exercises Total Joint Exercises Ankle Circles/Pumps: AROM;Both;20 reps Quad Sets: AROM;Left;10 reps Heel Slides: AROM;Left;10 reps   Assessment/Plan    PT Assessment Patient needs continued PT services  PT Problem List Decreased strength;Decreased balance;Decreased activity tolerance;Decreased mobility;Decreased knowledge of use of DME;Decreased knowledge of precautions;Pain;Impaired sensation       PT Treatment Interventions DME instruction;Gait training;Stair training;Functional mobility training;Therapeutic activities;Therapeutic exercise;Neuromuscular re-education;Balance training;Patient/family education    PT Goals (Current goals can be found in the Care Plan section)  Acute Rehab PT Goals Patient Stated Goal: to go home  PT Goal Formulation: With patient Time For Goal Achievement: 09/02/17 Potential to Achieve Goals: Good    Frequency 7X/week   Barriers to discharge        Co-evaluation               AM-PAC PT "6 Clicks" Daily Activity  Outcome Measure Difficulty turning over in bed (including adjusting bedclothes, sheets and blankets)?: A Little Difficulty moving from lying on back to sitting on the side of the bed? : A Little Difficulty sitting down on and standing up from a chair with arms (e.g., wheelchair, bedside commode, etc,.)?: Unable Help needed moving to and from a bed to chair (including a wheelchair)?: A Little Help needed walking in hospital room?: A Little Help needed climbing 3-5 steps with a railing? : A Lot 6 Click Score: 15    End of Session Equipment Utilized During Treatment: Gait belt;Left knee immobilizer Activity Tolerance: Patient limited by pain Patient left: in chair;with call bell/phone within reach;with family/visitor present Nurse  Communication: Mobility status PT Visit Diagnosis: Other abnormalities of gait and mobility (R26.89);Pain;Unsteadiness on feet (R26.81) Pain - Right/Left: Left Pain - part of body: Knee    Time: 5643-3295 PT Time Calculation (min) (ACUTE ONLY): 23 min   Charges:   PT Evaluation $PT Eval Low Complexity: 1 Low PT Treatments $Therapeutic Activity: 8-22 mins   PT G Codes:        Leighton Ruff, PT, DPT  Acute Rehabilitation Services  Pager: [phone]   Rudean Hitt 08/19/2017, 3:54 PM

## 2017-08-19 NOTE — Progress Notes (Signed)
Patient S/P Left total knee arthroplasty earlier today.  Up x1 with therapy, tolerated well.  Patient OOB ambulating with RN to bathroom when he became syncopal.  Upon my arrival patient lying in bed fully alter and oriented.  Denies CP, SOB or dizziness.  141/83  HR 71.  RN to notify MD of event.  No RRT interventions.  RN to call if assistance needed.

## 2017-08-20 ENCOUNTER — Encounter (HOSPITAL_COMMUNITY): Payer: Self-pay | Admitting: Orthopedic Surgery

## 2017-08-20 DIAGNOSIS — M1712 Unilateral primary osteoarthritis, left knee: Secondary | ICD-10-CM | POA: Diagnosis not present

## 2017-08-20 LAB — POCT I-STAT 4, (NA,K, GLUC, HGB,HCT)
Glucose, Bld: 97 mg/dL (ref 65–99)
HCT: 43 % (ref 39.0–52.0)
Hemoglobin: 14.6 g/dL (ref 13.0–17.0)
POTASSIUM: 7.1 mmol/L — AB (ref 3.5–5.1)
SODIUM: 129 mmol/L — AB (ref 135–145)

## 2017-08-20 MED ORDER — DEXAMETHASONE SODIUM PHOSPHATE 10 MG/ML IJ SOLN
10.0000 mg | Freq: Once | INTRAMUSCULAR | Status: AC
  Administered 2017-08-20: 10 mg via INTRAVENOUS
  Filled 2017-08-20: qty 1

## 2017-08-20 NOTE — Progress Notes (Signed)
    Subjective: Patient reports pain as mild to moderate.  Tolerating diet.  Urinating.   No CP, SOB.  OOB with therapy.  Objective:   VITALS:   Vitals:   08/19/17 1614 08/19/17 1820 08/19/17 1950 08/20/17 0328  BP: (!) 141/83 (!) 84/49 126/72 131/76  Pulse: 71 65 72 64  Resp:   16 16  Temp:   97.6 F (36.4 C) 97.9 F (36.6 C)  TempSrc:   Oral Oral  SpO2: 95%  99% 100%  Weight:      Height:       CBC Latest Ref Rng & Units 08/19/2017 08/19/2017 08/06/2017  WBC 4.0 - 10.5 K/uL - - 4.3  Hemoglobin 13.0 - 17.0 g/dL 13.9 14.6 14.8  Hematocrit 39.0 - 52.0 % 41.0 43.0 41.2  Platelets 150 - 400 K/uL - - 198   BMP Latest Ref Rng & Units 08/19/2017 08/19/2017 08/06/2017  Glucose 65 - 99 mg/dL 102(H) 97 90  BUN 6 - 20 mg/dL - - 10  Creatinine 0.61 - 1.24 mg/dL - - 0.70  Sodium 135 - 145 mmol/L 132(L) 129(L) 129(L)  Potassium 3.5 - 5.1 mmol/L 4.2 7.1(HH) 4.8  Chloride 101 - 111 mmol/L - - 93(L)  CO2 22 - 32 mmol/L - - 24  Calcium 8.9 - 10.3 mg/dL - - 9.7   Intake/Output      04/23 0701 - 04/24 0700 04/24 0701 - 04/25 0700   P.O. 120    I.V. (mL/kg) 1429.2 (18.8)    IV Piggyback 100    Total Intake(mL/kg) 1649.2 (21.6)    Urine (mL/kg/hr) 750 (0.4)    Blood 20    Total Output 770    Net +879.2            Physical Exam: General: NAD.  Supine in bed.  CPM in place.  Calm, conversant.  Wife at bedside. Resp: No increased wob Cardio: regular rate and rhythm ABD soft Neurologically intact MSK Neurovascularly intact Sensation intact distally Intact pulses distally Dorsiflexion/Plantar flexion intact Incision: dressing C/D/I   Assessment: 1 Day Post-Op  S/P Procedure(s) (LRB): TOTAL KNEE ARTHROPLASTY (Left) by Dr. Ernesta Amble. Murphy on 08/19/2017  Principal Problem:   Primary osteoarthritis of left knee Active Problems:   Essential hypertension   Hyperlipidemia   Tobacco abuse disorder   Osteoarthritis of left knee   Primary osteoarthritis, status post left total  knee arthroplasty Doing well postop day 1. Early postop syncopal episode yesterday noted.  He has since recovered well and is asymptomatic. Eating, drinking, and voiding. Pain controlled. Good early mobilization  Plan: Advance diet Up with therapy D/C IV fluids Incentive Spirometry Elevate and Apply ice CPM, bone foam  Weight Bearing: Weight Bearing as Tolerated (WBAT)  Dressings: Maintain Mepilex.  Please apply thigh high TED hose to operative leg prior to discharge. VTE prophylaxis: Aspirin, SCDs, ambulation Dispo: Home today after therapy session.   Charna Elizabeth Martensen III, PA-C 08/20/2017, 7:42 AM

## 2017-08-20 NOTE — Care Management Note (Signed)
Case Management Note  Patient Details  Name: Joe Cole MRN: [id number] Date of Birth: 10-23-1950  Subjective/Objective:  67 yr old male s/p left total knee arthroplasty.                  Action/Plan: Patient was preoperatively setup with [facility], no changes,. Will have support at discharge.    Expected Discharge Date:  08/20/17               Expected Discharge Plan:  [place]  In-House Referral:  NA  Discharge planning Services  CM Consult  Post Acute Care Choice:  Durable Medical Equipment, Home Health Choice offered to:  Patient  DME Arranged:  3-N-1, CPM, Walker rolling DME Agency:  [employer]  HH Arranged:  PT [place]:  [facility] [employer]  Status of Service:  Completed, signed off  If discussed at [employer], dates discussed:    Additional Comments:  Ninfa Meeker, RN 08/20/2017, 2:16 PM

## 2017-08-20 NOTE — Anesthesia Postprocedure Evaluation (Signed)
Anesthesia Post Note  Patient: Joe Cole  Procedure(s) Performed: TOTAL KNEE ARTHROPLASTY (Left Knee)     Patient location during evaluation: PACU Anesthesia Type: Regional, MAC and Spinal Level of consciousness: awake and alert Pain management: pain level controlled Vital Signs Assessment: post-procedure vital signs reviewed and stable Respiratory status: spontaneous breathing, nonlabored ventilation, respiratory function stable and patient connected to nasal cannula oxygen Cardiovascular status: stable and blood pressure returned to baseline Postop Assessment: no apparent nausea or vomiting and spinal receding Anesthetic complications: no    Last Vitals:  Vitals:   08/19/17 1950 08/20/17 0328  BP: 126/72 131/76  Pulse: 72 64  Resp: 16 16  Temp: 36.4 C 36.6 C  SpO2: 99% 100%    Last Pain:  Vitals:   08/20/17 1209  TempSrc:   PainSc: 5                  Carlotta Telfair

## 2017-08-20 NOTE — Plan of Care (Signed)
  Problem: Education: Goal: Knowledge of the prescribed therapeutic regimen will improve Outcome: Adequate for Discharge   Problem: Activity: Goal: Ability to avoid complications of mobility impairment will improve Outcome: Completed/Met Goal: Range of joint motion will improve Outcome: Adequate for Discharge   Problem: Clinical Measurements: Goal: Postoperative complications will be avoided or minimized Outcome: Adequate for Discharge   Problem: Pain Management: Goal: Pain level will decrease with appropriate interventions Outcome: Adequate for Discharge   Problem: Skin Integrity: Goal: Signs of wound healing will improve Outcome: Adequate for Discharge

## 2017-08-20 NOTE — Progress Notes (Signed)
Pt given discharge instructions and prescriptions. Instructions gone over with him and wife present and answered all questions to satisfaction. All belongings gathered to be sent home with pt. Pt in no distress at time of discharge.

## 2017-08-20 NOTE — Progress Notes (Addendum)
Physical Therapy Treatment Patient Details Name: Joe Cole MRN: [id number] DOB: 03-Apr-1951 Today's Date: 08/20/2017    History of Present Illness Pt is a 67 y/o male s/p elective L TKA. PMH includes HTN and tobacco use.     PT Comments    Pt performed gait and stair training in review for d/c home this pm.  Pt with improved safety during gait training and remains to require cues for safety with transfer.  After review patient verbalizes and demonstrated understanding.  He is safe to d/c home at this time with support from spouse.     Follow Up Recommendations  Follow surgeon's recommendation for DC plan and follow-up therapies;Supervision for mobility/OOB     Equipment Recommendations  None recommended by PT    Recommendations for Other Services       Precautions / Restrictions Precautions Precautions: Knee Precaution Booklet Issued: Yes (comment) Precaution Comments: Reviewed supine HEP And knee precautions with pt.  Restrictions Weight Bearing Restrictions: Yes LLE Weight Bearing: Weight bearing as tolerated    Mobility  Bed Mobility Overal bed mobility: Needs Assistance Bed Mobility: Supine to Sit     Supine to sit: Supervision     General bed mobility comments: Supervision for safety.   Transfers Overall transfer level: Needs assistance Equipment used: Rolling walker (2 wheeled) Transfers: Sit to/from Stand Sit to Stand: Supervision         General transfer comment: Cues for hand placement to push from seated surface.  Pt intially pulling on RW into standing.    Ambulation/Gait Ambulation/Gait assistance: Min assist Ambulation Distance (Feet): 200 Feet Assistive device: Rolling walker (2 wheeled) Gait Pattern/deviations: Step-to pattern;Decreased step length - right;Decreased step length - left;Decreased weight shift to left;Antalgic Gait velocity: Decreased    General Gait Details: Pt remains slow and antalgic, cues for soft landing of R foot.   Knee immobilizer removed for afternoon session.     Stairs Stairs: Yes Stairs assistance: Min guard Stair Management: One rail Right;Forwards Number of Stairs: 4 General stair comments: Pt able to recall and perform sequencing without cues and decreased assistance.     Wheelchair Mobility    Modified Rankin (Stroke Patients Only)       Balance Overall balance assessment: Needs assistance   Sitting balance-Leahy Scale: Good       Standing balance-Leahy Scale: Poor Standing balance comment: Reliant on BUE support.                             Cognition Arousal/Alertness: Awake/alert Behavior During Therapy: WFL for tasks assessed/performed Overall Cognitive Status: Within Functional Limits for tasks assessed                                        Exercises Deferred therapeutic exercises as OT arrived.    General Comments        Pertinent Vitals/Pain Pain Assessment: 0-10 Pain Score: 5  Pain Location: L knee  Pain Descriptors / Indicators: Aching;Operative site guarding Pain Intervention(s): Monitored during session;Repositioned(deferred ice pack as OT arrived.  )    Home Living                      Prior Function            PT Goals (current goals can now be found in the care plan  section) Acute Rehab PT Goals Patient Stated Goal: to go home  Potential to Achieve Goals: Good Progress towards PT goals: Progressing toward goals    Frequency    7X/week      PT Plan Current plan remains appropriate    Co-evaluation              AM-PAC PT "6 Clicks" Daily Activity  Outcome Measure  Difficulty turning over in bed (including adjusting bedclothes, sheets and blankets)?: A Little Difficulty moving from lying on back to sitting on the side of the bed? : A Little Difficulty sitting down on and standing up from a chair with arms (e.g., wheelchair, bedside commode, etc,.)?: Unable Help needed moving to and from a  bed to chair (including a wheelchair)?: A Little Help needed walking in hospital room?: A Little Help needed climbing 3-5 steps with a railing? : A Little 6 Click Score: 16    End of Session Equipment Utilized During Treatment: Gait belt Activity Tolerance: Patient limited by pain Patient left: in chair;with call bell/phone within reach;with family/visitor present Nurse Communication: Mobility status PT Visit Diagnosis: Other abnormalities of gait and mobility (R26.89);Pain;Unsteadiness on feet (R26.81) Pain - Right/Left: Left Pain - part of body: Knee     Time: 1351-1400 PT Time Calculation (min) (ACUTE ONLY): 9 min  Charges:  $Gait Training: 8-22 mins                    G Codes:       Governor Rooks, PTA pager [phone]    Cristela Blue 08/20/2017, 3:00 PM

## 2017-08-20 NOTE — Discharge Summary (Signed)
Discharge Summary  Patient ID: Joe Cole MRN: [id number] DOB/AGE: 66-01-1951 66 y.o.  Admit date: 08/19/2017 Discharge date: 08/20/2017  Admission Diagnoses:  Primary osteoarthritis of left knee  Discharge Diagnoses:  Principal Problem:   Primary osteoarthritis of left knee Active Problems:   Essential hypertension   Hyperlipidemia   Tobacco abuse disorder   Osteoarthritis of left knee   Past Medical History:  Diagnosis Date  . Arthritis   . Hypertension     Surgeries: Procedure(s): TOTAL KNEE ARTHROPLASTY on 08/19/2017   Consultants (if any):   Discharged Condition: Improved  Hospital Course: Joe Cole is an 67 y.o. male who was admitted 08/19/2017 with a diagnosis of Primary osteoarthritis of left knee and went to the operating room on 08/19/2017 and underwent the above named procedures.    He was given perioperative antibiotics:  Anti-infectives (From admission, onward)   Start     Dose/Rate Route Frequency Ordered Stop   08/19/17 1500  ceFAZolin (ANCEF) IVPB 2g/100 mL premix     2 g 200 mL/hr over 30 Minutes Intravenous Every 6 hours 08/19/17 1310 08/19/17 2139   08/19/17 1130  ceFAZolin (ANCEF) IVPB 2g/100 mL premix     2 g 200 mL/hr over 30 Minutes Intravenous To [facility] 08/18/17 1151 08/19/17 0858    .  He was given sequential compression devices, early ambulation, and aspirin for DVT prophylaxis.  He benefited maximally from the hospital stay and there were no complications.    Recent vital signs:  Vitals:   08/19/17 1950 08/20/17 0328  BP: 126/72 131/76  Pulse: 72 64  Resp: 16 16  Temp: 97.6 F (36.4 C) 97.9 F (36.6 C)  SpO2: 99% 100%    Recent laboratory studies:  Lab Results  Component Value Date   HGB 13.9 08/19/2017   HGB 14.6 08/19/2017   HGB 14.8 08/06/2017   Lab Results  Component Value Date   WBC 4.3 08/06/2017   PLT 198 08/06/2017   No results found for: INR Lab Results  Component Value Date   NA 132  (L) 08/19/2017   K 4.2 08/19/2017   CL 93 (L) 08/06/2017   CO2 24 08/06/2017   BUN 10 08/06/2017   CREATININE 0.70 08/06/2017   GLUCOSE 102 (H) 08/19/2017    Discharge Medications:   Allergies as of 08/20/2017   No Known Allergies     Medication List    STOP taking these medications   ibuprofen 200 MG tablet Commonly known as:  ADVIL,MOTRIN     TAKE these medications   acetaminophen 500 MG tablet Commonly known as:  TYLENOL Take 2 tablets (1,000 mg total) by mouth every 8 (eight) hours for 14 days. For Pain. What changed:    when to take this  reasons to take this  additional instructions   amLODipine 2.5 MG tablet Commonly known as:  NORVASC Take 2.5 mg by mouth daily.   aspirin EC 325 MG tablet Take 1 tablet (325 mg total) by mouth daily. For 30 days post op for DVT Prophylaxis   atorvastatin 20 MG tablet Commonly known as:  LIPITOR Take 20 mg by mouth daily.   celecoxib 200 MG capsule Commonly known as:  CELEBREX Take 1 capsule (200 mg total) by mouth 2 (two) times daily. For 2 weeks post op for pain and inflammation.  Discontinue Ibuprofen or other Anti-inflammatory medicine when taking this medicine.   docusate sodium 100 MG capsule Commonly known as:  COLACE Take 1 capsule (100  mg total) by mouth 2 (two) times daily. To prevent constipation while taking pain medication.   losartan-hydrochlorothiazide 100-25 MG tablet Commonly known as:  HYZAAR Take 1 tablet by mouth daily.   methocarbamol 500 MG tablet Commonly known as:  ROBAXIN Take 1 tablet (500 mg total) by mouth every 6 (six) hours as needed for muscle spasms.   omeprazole 20 MG capsule Commonly known as:  PRILOSEC Take 1 capsule (20 mg total) by mouth daily. 30 days for gastroprotection while taking NSAIDs.   ondansetron 4 MG tablet Commonly known as:  ZOFRAN Take 1 tablet (4 mg total) by mouth every 8 (eight) hours as needed for nausea or vomiting.   oxyCODONE 5 MG immediate release  tablet Commonly known as:  ROXICODONE Take 1 tablet (5 mg total) by mouth every 4 (four) hours as needed for breakthrough pain.       Diagnostic Studies: Dg Knee Left Port  Result Date: 08/19/2017 CLINICAL DATA:  Status post left total knee joint prosthesis placement. EXAM: PORTABLE LEFT KNEE - 1-2 VIEW COMPARISON:  None in PACs FINDINGS: The patient has undergone total knee joint prosthesis placement. Radiographic positioning of the prosthetic components is good. The interface with the native bone is normal. Small amounts of soft tissue gas and fluid are present. IMPRESSION: There is no immediate postprocedure complication following left total knee joint prosthesis placement. Electronically Signed   By: David  Martinique M.D.   On: 08/19/2017 13:51    Disposition: Discharge disposition: 01-Home or Self Care       Discharge Instructions    Discharge patient   Complete by:  As directed    Discharge disposition:  01-Home or Self Care   Discharge patient date:  08/20/2017      Follow-up Information    Renette Butters, MD Follow up.   Specialty:  Orthopedic Surgery Contact information: [address] [id number] [phone]            Signed: Prudencio Burly III PA-C 08/20/2017, 7:45 AM

## 2017-08-20 NOTE — Plan of Care (Signed)
  Problem: Education: Goal: Knowledge of General Education information will improve Outcome: Progressing   Problem: Clinical Measurements: Goal: Ability to maintain clinical measurements within normal limits will improve Outcome: Progressing   Problem: Pain Management: Goal: Pain level will decrease with appropriate interventions Outcome: Progressing

## 2017-08-20 NOTE — Progress Notes (Addendum)
Physical Therapy Treatment Patient Details Name: Joe Cole MRN: [id number] DOB: 04/09/1951 Today's Date: 08/20/2017    History of Present Illness Pt is a 67 y/o male s/p elective L TKA. PMH includes HTN and tobacco use.     PT Comments    Pt reports previous sycopal episode with nursing yesterday.  No complaints of dizziness during session.  Pt required cues for safety to keep feet within RW during turns.  Pt with plans to d/c home this pm.  Will f/u in pm to review exercises, gait training and stair training.      Follow Up Recommendations  Follow surgeon's recommendation for DC plan and follow-up therapies;Supervision for mobility/OOB     Equipment Recommendations  None recommended by PT    Recommendations for Other Services       Precautions / Restrictions Precautions Precautions: Knee Precaution Comments: Reviewed supine HEP And knee precautions with pt.  Restrictions Weight Bearing Restrictions: Yes LLE Weight Bearing: Weight bearing as tolerated    Mobility  Bed Mobility Overal bed mobility: Needs Assistance Bed Mobility: Supine to Sit     Supine to sit: Supervision     General bed mobility comments: Supervision for safety.   Transfers Overall transfer level: Needs assistance Equipment used: Rolling walker (2 wheeled) Transfers: Sit to/from Stand Sit to Stand: Min guard         General transfer comment: Cues for hand placement to push from seated surface.    Ambulation/Gait Ambulation/Gait assistance: Min assist Ambulation Distance (Feet): 100 Feet Assistive device: Rolling walker (2 wheeled) Gait Pattern/deviations: Step-to pattern;Decreased step length - right;Decreased step length - left;Decreased weight shift to left;Antalgic Gait velocity: Decreased    General Gait Details: Pt remains slow and natlgic, cues for soft landing of R foot.  Knee immobilizer donned  pre gait training.     Stairs Stairs: Yes Stairs assistance: Min  assist Stair Management: One rail Right;Forwards Number of Stairs: 4 General stair comments: Cues for sequencing and hand placement on railing.  Pt prefers both hand on R rail for support.  Min assist for safety and to assist in ascending and descending stairs.  Slight posterior lean when descending.     Wheelchair Mobility    Modified Rankin (Stroke Patients Only)       Balance Overall balance assessment: Needs assistance   Sitting balance-Leahy Scale: Good       Standing balance-Leahy Scale: Poor                              Cognition Arousal/Alertness: Awake/alert Behavior During Therapy: WFL for tasks assessed/performed Overall Cognitive Status: Within Functional Limits for tasks assessed                                        Exercises Total Joint Exercises Ankle Circles/Pumps: AROM;Both;20 reps Quad Sets: AROM;Left;10 reps Towel Squeeze: AROM;Left;10 reps;Supine Short Arc Quad: AROM;Left;10 reps;Supine Heel Slides: AROM;Left;10 reps;Supine Straight Leg Raises: AROM;Left;10 reps;Supine Long Arc Quad: AROM;10 reps;Supine;Left Goniometric ROM: 12-81 degrees L knee.      General Comments        Pertinent Vitals/Pain Pain Assessment: 0-10 Pain Score: 8  Pain Location: L knee  Pain Descriptors / Indicators: Aching;Operative site guarding Pain Intervention(s): Monitored during session    Home Living  Prior Function            PT Goals (current goals can now be found in the care plan section) Acute Rehab PT Goals Patient Stated Goal: to go home  Potential to Achieve Goals: Good Progress towards PT goals: Progressing toward goals    Frequency    7X/week      PT Plan Current plan remains appropriate    Co-evaluation              AM-PAC PT "6 Clicks" Daily Activity  Outcome Measure  Difficulty turning over in bed (including adjusting bedclothes, sheets and blankets)?: A  Little Difficulty moving from lying on back to sitting on the side of the bed? : A Little Difficulty sitting down on and standing up from a chair with arms (e.g., wheelchair, bedside commode, etc,.)?: Unable Help needed moving to and from a bed to chair (including a wheelchair)?: A Little Help needed walking in hospital room?: A Little Help needed climbing 3-5 steps with a railing? : A Little 6 Click Score: 16    End of Session Equipment Utilized During Treatment: Gait belt;Left knee immobilizer Activity Tolerance: Patient limited by pain Patient left: in chair;with call bell/phone within reach;with family/visitor present Nurse Communication: Mobility status PT Visit Diagnosis: Other abnormalities of gait and mobility (R26.89);Pain;Unsteadiness on feet (R26.81) Pain - Right/Left: Left Pain - part of body: Knee     Time: 6286-3817 PT Time Calculation (min) (ACUTE ONLY): 28 min  Charges:  $Gait Training: 8-22 mins $Therapeutic Exercise: 8-22 mins                    G Codes:       Governor Rooks, PTA pager [phone]    Cristela Blue 08/20/2017, 1:43 PM

## 2018-02-25 ENCOUNTER — Ambulatory Visit
Admission: RE | Admit: 2018-02-25 | Discharge: 2018-02-25 | Disposition: A | Discharge status: Home / Self Care | Payer: Medicare Other | Source: Ambulatory Visit | Attending: Family Medicine | Admitting: Family Medicine

## 2018-02-25 ENCOUNTER — Other Ambulatory Visit: Payer: Self-pay | Admitting: Family Medicine

## 2018-02-25 DIAGNOSIS — J40 Bronchitis, not specified as acute or chronic: Secondary | ICD-10-CM

## 2018-03-18 ENCOUNTER — Encounter: Payer: Self-pay | Admitting: Pulmonary Disease

## 2018-03-18 ENCOUNTER — Ambulatory Visit: Payer: Medicare Other | Admitting: Pulmonary Disease

## 2018-03-18 VITALS — BP 124/82 | HR 86 | Ht 71.0 in | Wt 181.0 lb

## 2018-03-18 DIAGNOSIS — Z716 Tobacco abuse counseling: Secondary | ICD-10-CM

## 2018-03-18 DIAGNOSIS — R059 Cough, unspecified: Secondary | ICD-10-CM

## 2018-03-18 DIAGNOSIS — R05 Cough: Secondary | ICD-10-CM

## 2018-03-18 MED ORDER — BUPROPION HCL ER (SMOKING DET) 150 MG PO TB12: 150.0000 mg | ORAL_TABLET | Freq: Two times a day (BID) | ORAL | 2 refills | Status: DC

## 2018-03-18 MED ORDER — ALBUTEROL SULFATE HFA 108 (90 BASE) MCG/ACT IN AERS: 2.0000 | INHALATION_SPRAY | Freq: Four times a day (QID) | RESPIRATORY_TRACT | 5 refills | Status: DC | PRN

## 2018-03-18 MED ORDER — FLUTICASONE PROPIONATE 50 MCG/ACT NA SUSP: 1.0000 | Freq: Every day | NASAL | 2 refills | Status: DC

## 2018-03-18 NOTE — Progress Notes (Signed)
[facility]  Chief Complaint  Patient presents with  . pulm consult    Pt referred by Dr. [name] MD. Pt has productive cough-clear, wheezing some in last month. Pt is requesting flu shot today and would like to talk about the pna shot.    Constitutional:  BP 124/82 (BP Location: Left Arm, Cuff Size: Normal)   Pulse 86   Ht 5\' 11"  (1.803 m)   Wt 181 lb (82.1 kg)   SpO2 99%   BMI 25.24 kg/m   Past Medical History:  Arthritis, Hypertension  Brief Summary:  Joe Cole is a 67 y.o. male smoker with a cough.  He has noticed a cough for the past several weeks.  He has sinus congestion and post nasal drip.  This is more troublesome at night and when he wakes up.  He has never been told he has allergies or asthma.  He thinks his father had emphysema.    He brings up clear sputum.  He sometimes getting wheezing, but this can come from his neck and chest area.  He doesn't feel like his breathing limits his activity.  He has not been tried on inhalers, and hasn't done a breathing test.  He tried mucinex, but this only helped a little.  He is from [place].  Works in [employer].  He has a International aid/development worker.  No history of pneumonia or exposure to TB.  No recent travel.  He had a chest xray from 02/25/18 (reviewed by me) and this showed hyperinflation.    Physical Exam:   Appearance - well kempt   ENMT - clear nasal mucosa, midline nasal  septum, no oral exudates, no LAN, trachea midline  Respiratory - normal chest wall, normal respiratory effort, no accessory muscle use, no wheeze/rales  CV - s1s2 regular rate and rhythm, no murmurs, no peripheral edema, radial pulses symmetric  GI - soft, non tender, no masses  Lymph - no adenopathy noted in neck and axillary areas  MSK - normal gait  Ext - no cyanosis, clubbing, or joint inflammation noted  Skin - no rashes, lesions, or ulcers  Neuro - normal strength, oriented x  3  Psych - normal mood and affect  Discussion:  He is a smoker and has persistent cough.  His chest xray showed hyperinflation.  He has persistent sinus congestion.  His cough could be coming from upper airway cough syndrome with post nasal drip, asthma, and/or COPD.  Assessment/Plan:   Chronic cough. - nasal irrigation and flonase - will have him try prn albuterol - will arrange for pulmonary function test  Tobacco abuse. - reviewed different options to help quit - he would like to try bupropion >> dosing regimen, setting quit date, and side effect profile discussed   Patient Instructions  Nasal irrigation (saline nasal spray) nightly.  Flonase 1 spray in each nostril nightly.  Proair two puffs every 6 hours as needed for cough, wheeze, or chest congestion.  Bupropion 150 mg pill >> take 1 pill daily for 3 days, then 1 pill twice daily.  Set your quit date to stop smoking after you have been on bupropion for one week.  Will schedule pulmonary function test.  Follow up in 4 weeks    [name], MD [facility]/Critical [facility] Pager: [phone] 03/18/2018, 3:16 PM  Flow Sheet     Pulmonary tests:    Review of Systems:  Constitutional: Negative for fever and unexpected weight change.  HENT: Positive for postnasal drip, sinus pressure and sneezing. Negative for congestion, dental problem, ear pain, nosebleeds, rhinorrhea, sore throat and trouble swallowing.   Eyes: Negative for redness and itching.  Respiratory: Positive for cough, chest tightness and wheezing. Negative for shortness of breath.   Cardiovascular: Negative for palpitations and leg swelling.  Gastrointestinal: Negative for nausea and vomiting.  Genitourinary: Negative for dysuria.  Musculoskeletal: Negative for joint swelling.  Skin: Negative for rash.  Allergic/Immunologic: Positive for environmental allergies. Negative for food allergies and immunocompromised state.  Neurological: Negative  for headaches.  Hematological: Does not bruise/bleed easily.  Psychiatric/Behavioral: Negative for dysphoric mood. The patient is not nervous/anxious.    Medications:   Allergies as of 03/18/2018   No Known Allergies     Medication List        Accurate as of 03/18/18  3:16 PM. Always use your most recent med list.          albuterol 108 (90 Base) MCG/ACT inhaler Commonly known as:  PROVENTIL HFA;VENTOLIN HFA Inhale 2 puffs into the lungs every 6 (six) hours as needed for wheezing or shortness of breath.   amLODipine 2.5 MG tablet Commonly known as:  NORVASC Take 2.5 mg by mouth daily.   atorvastatin 20 MG tablet Commonly known as:  LIPITOR Take 20 mg by mouth daily.   buPROPion 150 MG 12 hr tablet Commonly known as:  ZYBAN Take 1 tablet (150 mg total) by mouth 2 (two) times daily.   fluticasone 50 MCG/ACT nasal spray Commonly known as:  FLONASE Place 1 spray into both nostrils daily.   losartan-hydrochlorothiazide 100-25 MG tablet Commonly known as:  HYZAAR Take 1 tablet by mouth daily.       Past Surgical History:  He  has a past surgical history that includes Knee arthroscopy; Toe Surgery; Tonsillectomy; and Total knee arthroplasty (Left, 08/19/2017).  Family History:  His family history includes Atrial fibrillation in his mother; Emphysema in his father; Pneumonia in his father.  Social History:  He  reports that he has been smoking cigarettes. He has a 46.00 pack-year smoking history. He has never used smokeless tobacco. He reports that he drinks about 19.0 standard drinks of alcohol per week. He reports that he does not use drugs.

## 2018-03-18 NOTE — Progress Notes (Signed)
   Subjective:    Patient ID: VERLAND SPRINKLE, male    DOB: 1950/08/20, 67 y.o.   MRN: [id number]  HPI    Review of Systems  Constitutional: Negative for fever and unexpected weight change.  HENT: Positive for postnasal drip, sinus pressure and sneezing. Negative for congestion, dental problem, ear pain, nosebleeds, rhinorrhea, sore throat and trouble swallowing.   Eyes: Negative for redness and itching.  Respiratory: Positive for cough, chest tightness and wheezing. Negative for shortness of breath.   Cardiovascular: Negative for palpitations and leg swelling.  Gastrointestinal: Negative for nausea and vomiting.  Genitourinary: Negative for dysuria.  Musculoskeletal: Negative for joint swelling.  Skin: Negative for rash.  Allergic/Immunologic: Positive for environmental allergies. Negative for food allergies and immunocompromised state.  Neurological: Negative for headaches.  Hematological: Does not bruise/bleed easily.  Psychiatric/Behavioral: Negative for dysphoric mood. The patient is not nervous/anxious.        Objective:   Physical Exam        Assessment & Plan:

## 2018-03-18 NOTE — Patient Instructions (Signed)
Nasal irrigation (saline nasal spray) nightly.  Flonase 1 spray in each nostril nightly.  Proair two puffs every 6 hours as needed for cough, wheeze, or chest congestion.  Bupropion 150 mg pill >> take 1 pill daily for 3 days, then 1 pill twice daily.  Set your quit date to stop smoking after you have been on bupropion for one week.  Will schedule pulmonary function test.  Follow up in 4 weeks

## 2018-04-17 ENCOUNTER — Telehealth: Payer: Self-pay | Admitting: Cardiology

## 2018-04-17 NOTE — Telephone Encounter (Signed)
Nevin Bloodgood there is nothing on this note?

## 2018-05-04 ENCOUNTER — Ambulatory Visit (INDEPENDENT_AMBULATORY_CARE_PROVIDER_SITE_OTHER): Payer: Medicare HMO | Admitting: Pulmonary Disease

## 2018-05-04 ENCOUNTER — Encounter: Payer: Self-pay | Admitting: Pulmonary Disease

## 2018-05-04 VITALS — BP 124/82 | HR 102 | Ht 72.0 in | Wt 180.0 lb

## 2018-05-04 DIAGNOSIS — R05 Cough: Secondary | ICD-10-CM | POA: Diagnosis not present

## 2018-05-04 DIAGNOSIS — Z23 Encounter for immunization: Secondary | ICD-10-CM

## 2018-05-04 DIAGNOSIS — Z716 Tobacco abuse counseling: Secondary | ICD-10-CM

## 2018-05-04 DIAGNOSIS — R059 Cough, unspecified: Secondary | ICD-10-CM

## 2018-05-04 DIAGNOSIS — J432 Centrilobular emphysema: Secondary | ICD-10-CM

## 2018-05-04 LAB — PULMONARY FUNCTION TEST
DL/VA % pred: 70 %
DL/VA: 3.32 ml/min/mmHg/L
DLCO UNC % PRED: 60 %
DLCO unc: 21.11 ml/min/mmHg
FEF 25-75 PRE: 1.37 L/s
FEF2575-%PRED-PRE: 47 %
FEV1-%PRED-PRE: 64 %
FEV1-PRE: 2.35 L
FEV1FVC-%Pred-Pre: 79 %
FEV6-%Pred-Pre: 82 %
FEV6-Pre: 3.83 L
FEV6FVC-%PRED-PRE: 103 %
FVC-%PRED-PRE: 81 %
FVC-PRE: 3.98 L
PRE FEV1/FVC RATIO: 59 %
Pre FEV6/FVC Ratio: 99 %
RV % pred: 197 %
RV: 4.89 L
TLC % pred: 121 %
TLC: 9 L

## 2018-05-04 NOTE — Addendum Note (Signed)
Addended by: Nena Polio on: 05/04/2018 05:25 PM   Modules accepted: Orders

## 2018-05-04 NOTE — Progress Notes (Signed)
[facility]  Chief Complaint  Patient presents with  . Follow-up    Pt's cough has improved slightly since last ov, having more issues with the cough first thing in the morning. Pt had PFT prior to ov today, was unable to complete post spiro due to having 2 puffs of Albuterol 2 hrs prior to testing.    Constitutional:  BP 124/82 (BP Location: Right Arm, Cuff Size: Normal)   Pulse (!) 102   Ht 6' (1.829 m)   Wt 180 lb (81.6 kg)   SpO2 95%   BMI 24.41 kg/m   Past Medical History:  Arthritis, Hypertension  Brief Summary:  Joe Cole is a 68 y.o. male smoker with COPD/emphysema.    PFT today showed mild to moderate obstruction, air trapping, and mild diffusion defect.  He is gradually cutting down on cigarettes.  Feels zyban has helped.  No side effects.  Has cough in the morning.  Not having wheeze, sputum, chest pain, fever, hemoptysis, or leg swelling.  Has been using proair bid, but he thought he had to.  He doesn't feel like he needs it this often otherwise.  His father had emphysema.  Physical Exam:   Appearance - well kempt   ENMT - no sinus tenderness, no nasal discharge, no oral exudate  Neck - no masses, trachea midline, no thyromegaly, no elevation in JVP  Respiratory - normal appearance of chest wall, normal respiratory effort w/o accessory muscle use, no dullness on percussion, no tactile fremitus, no wheezing or rales  CV - s1s2 regular rate and rhythm, no murmurs, no peripheral edema, no varicosities, radial pulses symmetric  GI - soft, non tender, no masses, no hepatosplenomegaly  Lymph - no adenopathy noted in neck and axillary areas  MSK - normal gait  Ext - no cyanosis, clubbing, or joint inflammation noted  Skin - no rashes, lesions, or ulcers  Neuro - normal strength, oriented x 3  Psych - normal mood and affect   Assessment/Plan:   COPD with emphysema. - reviewed his PFT results with him -  advised him he could try using proair prn  - hold off on maintenance inhaler for now - high dose flu shot and prevnar shots today - will check A1AT level  Post nasal drip. - prn flonase  Tobacco abuse. - improved with zyban, continue this; might be ready to wean off by his next visit   Patient Instructions  High dose influenza and Pneumonia 23 vaccinations today  Will arrange for lab test to check for inherited form of emphysema  Follow up in 2 months    [name], MD [place] Pager: [phone] 05/04/2018, 4:38 PM  Flow Sheet     Pulmonary tests:   PFT 05/04/18 >> FEV1 2.35 (64%), FEV1% 59, TLC 9.00 (121%), RV 4.89 (197%), DLCO 60%  Medications:   Allergies as of 05/04/2018   No Known Allergies     Medication List       Accurate as of May 04, 2018  4:38 PM. Always use your most recent med list.        albuterol 108 (90 Base) MCG/ACT inhaler Commonly known as:  PROAIR HFA Inhale 2 puffs into the lungs every 6 (six) hours as needed for wheezing or shortness of breath.   amLODipine 2.5 MG tablet Commonly known as:  NORVASC Take 2.5 mg by mouth daily.   atorvastatin 20 MG tablet Commonly known as:  LIPITOR Take 20  mg by mouth daily.   buPROPion 150 MG 12 hr tablet Commonly known as:  ZYBAN Take 1 tablet (150 mg total) by mouth 2 (two) times daily.   fluticasone 50 MCG/ACT nasal spray Commonly known as:  FLONASE Place 1 spray into both nostrils daily.   losartan-hydrochlorothiazide 100-25 MG tablet Commonly known as:  HYZAAR Take 1 tablet by mouth daily.       Past Surgical History:  He  has a past surgical history that includes Knee arthroscopy; Toe Surgery; Tonsillectomy; and Total knee arthroplasty (Left, 08/19/2017).  Family History:  His family history includes Atrial fibrillation in his mother; Emphysema in his father; Pneumonia in his father.  Social History:  He  reports that he has been smoking cigarettes. He  has a 23.00 pack-year smoking history. He has never used smokeless tobacco. He reports current alcohol use of about 19.0 standard drinks of alcohol per week. He reports that he does not use drugs.

## 2018-05-04 NOTE — Progress Notes (Signed)
Pt completed pre spiro, DLCO, and pleth today. Pt did not do the post spiro due to having 2 puffs of Albuterol Inhaler 2 hrs prior to testing today.

## 2018-05-04 NOTE — Addendum Note (Signed)
Addended by: Nena Polio on: 05/04/2018 05:19 PM   Modules accepted: Orders

## 2018-05-04 NOTE — Patient Instructions (Signed)
High dose influenza and Pneumonia 23 vaccinations today  Will arrange for lab test to check for inherited form of emphysema  Follow up in 2 months

## 2018-05-08 LAB — ALPHA-1 ANTITRYPSIN PHENOTYPE: A1 ANTITRYPSIN SER: 167 mg/dL (ref 83–199)

## 2018-05-12 ENCOUNTER — Telehealth: Payer: Self-pay | Admitting: Pulmonary Disease

## 2018-05-12 NOTE — Telephone Encounter (Signed)
Attempted to contact pt's wife, [name]. I did not receive an answer. There was not an option for me to leave a message. Will try back.

## 2018-05-12 NOTE — Telephone Encounter (Signed)
A1AT level 05/04/18 >> 167, MM   Please let him know his lab test was normal.  He doesn't have inherited form of emphysema.

## 2018-05-12 NOTE — Telephone Encounter (Signed)
Patient's wife, Joe Cole, calling for lab results.  CB is [phone].

## 2018-05-14 NOTE — Telephone Encounter (Signed)
Tried to call patient, no VM option at time of call it was busy signal. X1

## 2018-05-18 NOTE — Telephone Encounter (Signed)
Tried to call patient, no VM option at time of call it was busy signal. X2

## 2018-05-19 NOTE — Telephone Encounter (Signed)
Tried to call patient, no VM option at time of call it was busy signal. X3 We have left three messages for pt to call our office back regarding results. No response at this time, a letter has been placed in mail today for pt to call our office. Mailed letter of communication today. Nothing further needed at this time.

## 2018-05-21 ENCOUNTER — Telehealth: Payer: Self-pay | Admitting: Pulmonary Disease

## 2018-05-21 NOTE — Telephone Encounter (Signed)
Called and spoke with Patients Wife, Joe Cole.  Lab results given.  Understanding stated. Nothing further at this time.  Per [facility]-    A1AT level 05/04/18 >> 167, MM   Please let him know his lab test was normal.  He doesn't have inherited form of emphysema.

## 2018-06-03 DIAGNOSIS — Z961 Presence of intraocular lens: Secondary | ICD-10-CM | POA: Diagnosis not present

## 2018-06-18 ENCOUNTER — Other Ambulatory Visit: Payer: Self-pay | Admitting: Cardiology

## 2018-06-18 MED ORDER — AMLODIPINE BESYLATE 2.5 MG PO TABS: 2.5000 mg | ORAL_TABLET | Freq: Every day | ORAL | 0 refills | Status: DC

## 2018-06-18 NOTE — Telephone Encounter (Signed)
Per Dr. Thurman Coyer last note patient is to be seen on PRN basis. 90 days refilled with note to pharmacy that PCP needs to take over management.

## 2018-06-18 NOTE — Telephone Encounter (Signed)
° °  1. Which medications need to be refilled? (please list name of each medication and dose if known) Amlodipine besylate 2.5mg  tablet once daily  2. Which pharmacy/location (including street and city if local pharmacy) is medication to be sent to? [facility] [address] [place]  3. Do they need a 30 day or 90 day supply? [place]

## 2018-07-05 ENCOUNTER — Other Ambulatory Visit: Payer: Self-pay | Admitting: Pulmonary Disease

## 2018-07-06 ENCOUNTER — Ambulatory Visit: Payer: Medicare HMO | Admitting: Pulmonary Disease

## 2018-07-06 ENCOUNTER — Encounter: Payer: Self-pay | Admitting: Pulmonary Disease

## 2018-07-06 VITALS — BP 126/84 | HR 89 | Ht 72.0 in | Wt 180.0 lb

## 2018-07-06 DIAGNOSIS — I10 Essential (primary) hypertension: Secondary | ICD-10-CM | POA: Diagnosis not present

## 2018-07-06 DIAGNOSIS — Z716 Tobacco abuse counseling: Secondary | ICD-10-CM | POA: Diagnosis not present

## 2018-07-06 DIAGNOSIS — E7800 Pure hypercholesterolemia, unspecified: Secondary | ICD-10-CM | POA: Diagnosis not present

## 2018-07-06 DIAGNOSIS — J432 Centrilobular emphysema: Secondary | ICD-10-CM

## 2018-07-06 DIAGNOSIS — Z125 Encounter for screening for malignant neoplasm of prostate: Secondary | ICD-10-CM | POA: Diagnosis not present

## 2018-07-06 DIAGNOSIS — R69 Illness, unspecified: Secondary | ICD-10-CM | POA: Diagnosis not present

## 2018-07-06 NOTE — Patient Instructions (Signed)
Follow up in 4 months 

## 2018-07-06 NOTE — Progress Notes (Signed)
[facility]  Chief Complaint  Patient presents with  . Follow-up    Coughing up drainage from back of throat.     Constitutional:  BP 126/84 (BP Location: Left Arm, Patient Position: Sitting, Cuff Size: Normal)   Pulse 89   Ht 6' (1.829 m)   Wt 180 lb (81.6 kg)   SpO2 98%   BMI 24.41 kg/m   Past Medical History:  Arthritis, Hypertension  Brief Summary:  Joe Cole is a 68 y.o. male smoker with COPD/emphysema.    Has cough in morning and some at night.  Overall better.  Not having wheeze, chest pain, or swelling.  Feels zyban has helped.  Down to 1/2 pack per day.  Doesn't use albuterol much.   Physical Exam:   Appearance - well kempt   ENMT - no sinus tenderness, no nasal discharge, no oral exudate  Neck - no masses, trachea midline, no thyromegaly, no elevation in JVP  Respiratory - normal appearance of chest wall, normal respiratory effort w/o accessory muscle use, no dullness on percussion, no wheezing or rales  CV - s1s2 regular rate and rhythm, no murmurs, no peripheral edema, radial pulses symmetric  GI - soft, non tender  Lymph - no adenopathy noted in neck and axillary areas  MSK - normal gait  Ext - no cyanosis, clubbing, or joint inflammation noted  Skin - no rashes, lesions, or ulcers  Neuro - normal strength, oriented x 3  Psych - normal mood and affect   Assessment/Plan:   COPD with emphysema. - continue prn albuterol  Post nasal drip. - prn flonase  Tobacco abuse. - continue zyban   Patient Instructions  Follow up in 4 months    [name], MD [place] Pager: [phone] 07/06/2018, 4:49 PM  Flow Sheet     Pulmonary tests:   PFT 05/04/18 >> FEV1 2.35 (64%), FEV1% 59, TLC 9.00 (121%), RV 4.89 (197%), DLCO 60% A1AT level 05/04/18 >> 167, MM  Medications:   Allergies as of 07/06/2018   No Known Allergies     Medication List       Accurate as of July 06, 2018  4:49 PM. Always use your most recent med list.        albuterol 108 (90 Base) MCG/ACT inhaler Commonly known as:  ProAir HFA Inhale 2 puffs into the lungs every 6 (six) hours as needed for wheezing or shortness of breath.   amLODipine 2.5 MG tablet Commonly known as:  NORVASC Take 1 tablet (2.5 mg total) by mouth daily.   atorvastatin 20 MG tablet Commonly known as:  LIPITOR Take 20 mg by mouth daily.   buPROPion 150 MG 12 hr tablet Commonly known as:  ZYBAN TAKE 1 TABLET BY MOUTH TWICE A DAY   fluticasone 50 MCG/ACT nasal spray Commonly known as:  FLONASE Place 1 spray into both nostrils daily.   losartan-hydrochlorothiazide 100-25 MG tablet Commonly known as:  HYZAAR Take 1 tablet by mouth daily.       Past Surgical History:  He  has a past surgical history that includes Knee arthroscopy; Toe Surgery; Tonsillectomy; and Total knee arthroplasty (Left, 08/19/2017).  Family History:  His family history includes Atrial fibrillation in his mother; Emphysema in his father; Pneumonia in his father.  Social History:  He  reports that he has been smoking cigarettes. He has a 23.00 pack-year smoking history. He has never used smokeless tobacco. He reports current alcohol use  of about 19.0 standard drinks of alcohol per week. He reports that he does not use drugs.

## 2018-07-13 DIAGNOSIS — Z125 Encounter for screening for malignant neoplasm of prostate: Secondary | ICD-10-CM | POA: Diagnosis not present

## 2018-07-13 DIAGNOSIS — I10 Essential (primary) hypertension: Secondary | ICD-10-CM | POA: Diagnosis not present

## 2018-07-13 DIAGNOSIS — E7800 Pure hypercholesterolemia, unspecified: Secondary | ICD-10-CM | POA: Diagnosis not present

## 2018-09-08 DIAGNOSIS — Z20828 Contact with and (suspected) exposure to other viral communicable diseases: Secondary | ICD-10-CM | POA: Diagnosis not present

## 2018-09-08 DIAGNOSIS — R05 Cough: Secondary | ICD-10-CM | POA: Diagnosis not present

## 2018-10-05 ENCOUNTER — Telehealth: Payer: Self-pay | Admitting: Pulmonary Disease

## 2018-10-05 NOTE — Telephone Encounter (Signed)
Primary Pulmonologist: [facility] Last office visit and with whom: 07/06/2018 with [facility] What do we see them for (pulmonary problems): centrilobular emphysema Last OV assessment/plan:  Assessment/Plan:   COPD with emphysema. - continue prn albuterol  Post nasal drip. - prn flonase  Tobacco abuse. - continue zyban   Patient Instructions  Follow up in 4 months  Was appointment offered to patient (explain)?  Pt scheduled for appt.   Reason for call: Called and spoke with [name] [facility]) who stated pt has had a deep cough since he quit smoking 3 weeks ago.  Pt has taken albuterol inhaler twice daily which has not helped with pt's cough. Pt has also taken mucinex and also has tried zyrtec and neither of these meds have helped with pt's symptoms.  Pt is not able to get any sleep due to the cough.  Pt does have SOB involved with the cough.  Pt had a fever of 100 3 weeks ago and was tested for COVID but COVID test was negative. Pt has not had a fever since.  Stated to [name] that we should get pt scheduled for televisit to further evaluate symptoms and she verbalized understanding. Pt has been scheduled for televisit tomorrow with Wyn Quaker, NP at [place] needed.  (examples of things to ask: : When did symptoms start? Fever? Cough? Productive? Color to sputum? More sputum than usual? Wheezing? Have you needed increased oxygen? Are you taking your respiratory medications? What over the counter measures have you tried?)

## 2018-10-05 NOTE — Progress Notes (Signed)
Virtual Visit via Telephone Note  I connected with Joe Cole on 10/06/18 at  9:00 AM EDT by telephone and verified that I am speaking with the correct person using two identifiers.  Location: Patient: Home Provider: [facility] - [address], [place], [place], [place] [address]   I discussed the limitations, risks, security and privacy concerns of performing an evaluation and management service by telephone and the availability of in person appointments. I also discussed with the patient that there may be a patient responsible charge related to this service. The patient expressed understanding and agreed to proceed.  Patient consented to consult via telephone: Yes People present and their role in pt care: Pt   History of Present Illness: 68 y.o. male smoker with COPD/emphysema.   Smoking History: Former Smoker, quit 09/08/2018, 50+ pack year smoker. Maintenance: None Patient of Dr. Halford Chessman  Chief complaint: Cough for 3 weeks    68 year old male former smoker who recently stop smoking on 09/08/2018.  Patient with a greater than 50-pack-year smoking history.  He is not on any sort of maintenance inhalers at this time.  Patient contacted our office as he has had worsening cough over the last 3 to 4 weeks.  He feels that the cough is progressively getting worse.  He is producing more sputum than his baseline.  The sputum color is clear.  He has occasional wheezing.  Patient denies any body aches or chills currently.  They did try to treat the cough with over-the-counter allergy medicine and patient is started Zyrtec.  This is not relieved the cough.  He does denies any sort of GERD or acid reflux symptoms.  Patient denies sinus pressure or sinus pain.  Patient reports that when he initially stop smoking 4 weeks ago he did run a short course of a fever when he presented to his primary care office who routed to him to get tested for the SARS-CoV-2 virus, patient reports this test  was negative.  We will request these results from his primary care doctor Dr. Kenton Kingfisher.  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  This is partially due to his shortness of breath as well as the outbreak of the coronavirus.  MMRC - Breathlessness Score 3 - I stop for breath after walking about 100 yards or after a few minutes on level ground (isle at grocery store is 159ft)  10/06/2018 - Cough ROS:   When to the symptoms start: 3 weeks ago How are you today: cough worsening   Have you had fever/sore throat (first 5 to 7 days of URI) or Have you had cough/nasal congestion (10 to 14 days of URI) : cough Have you used anything to treat the cough, as anything improved: tried mucinex no relief Is it a dry or wet cough: wet cough, clear productive sputum Does the cough happen when your breathing or when you breathe out: unsure Other any triggers to your cough, or any aggravating factors: random  Daily antihistamine: zyrtec  GERD treatment: none Singulair: none   Cough checklist (bolded indicates presence):  Adherence, acid reflux, ACE inhibitor, active sinus disease, active smoking, recently quit, adverse effects of medications (amiodarone/Macrodantin/bb), alpha 1, allergies, aspiration, anxiety, bronchiectasis, congestive heart failure (diastolic)    Observations/Objective:  PFT 05/04/18 >> FEV1 2.35 (64%), FEV1% 59, TLC 9.00 (121%), RV 4.89 (197%), DLCO 60%  A1AT  level 05/04/18 >> 167, MM  Assessment and Plan:  Former smoker Plan: Continue to stop smoking Offered nicotine replacement therapies today, patient declined Patient maintained on Zyban, continue Referral to lung cancer screening program  COPD mixed type [facility]) Assessment: January/2020 spirometry with DLCO shows an FEV1 of 2.35 (64% predicted), DLCO 60% Alpha-1 levels in January/20 - 167 mMRC 3  today Patient reporting audible wheezing occasionally as well as increased shortness of breath and increased cough and increased sputum production which is clear Not on any sort of maintenance inhalers at this time Using his rescue inhaler 2-3 times daily with no relief  Plan: Doxycycline today Prednisone today Congratulations on stopping smoking Keep scheduled follow-up with our office in July/2020 Consider starting a trial of maintenance inhalers Continue rescue inhaler as needed Referral to lung cancer screening program   Follow Up Instructions:  Return in about 4 weeks (around 11/03/2018), or if symptoms worsen or fail to improve, for Follow up with Wyn Quaker FNP-C.   I discussed the assessment and treatment plan with the patient. The patient was provided an opportunity to ask questions and all were answered. The patient agreed with the plan and demonstrated an understanding of the instructions.   The patient was advised to call back or seek an in-person evaluation if the symptoms worsen or if the condition fails to improve as anticipated.  I provided 24 minutes of non-face-to-face time during this encounter.   Lauraine Rinne, NP

## 2018-10-06 ENCOUNTER — Encounter: Payer: Self-pay | Admitting: Pulmonary Disease

## 2018-10-06 ENCOUNTER — Ambulatory Visit (INDEPENDENT_AMBULATORY_CARE_PROVIDER_SITE_OTHER): Payer: Medicare HMO | Admitting: Pulmonary Disease

## 2018-10-06 ENCOUNTER — Other Ambulatory Visit: Payer: Self-pay

## 2018-10-06 DIAGNOSIS — J449 Chronic obstructive pulmonary disease, unspecified: Secondary | ICD-10-CM | POA: Insufficient documentation

## 2018-10-06 DIAGNOSIS — Z87891 Personal history of nicotine dependence: Secondary | ICD-10-CM

## 2018-10-06 MED ORDER — DOXYCYCLINE HYCLATE 100 MG PO TABS: 100.0000 mg | ORAL_TABLET | Freq: Two times a day (BID) | ORAL | 0 refills | Status: DC

## 2018-10-06 MED ORDER — PREDNISONE 10 MG PO TABS: ORAL_TABLET | ORAL | 0 refills | Status: DC

## 2018-10-06 NOTE — Assessment & Plan Note (Signed)
Assessment: January/2020 spirometry with DLCO shows an FEV1 of 2.35 (64% predicted), DLCO 60% Alpha-1 levels in January/20 - 167 mMRC 3 today Patient reporting audible wheezing occasionally as well as increased shortness of breath and increased cough and increased sputum production which is clear Not on any sort of maintenance inhalers at this time Using his rescue inhaler 2-3 times daily with no relief  Plan: Doxycycline today Prednisone today Congratulations on stopping smoking Keep scheduled follow-up with our office in July/2020 Consider starting a trial of maintenance inhalers Continue rescue inhaler as needed Referral to lung cancer screening program

## 2018-10-06 NOTE — Assessment & Plan Note (Signed)
Plan: Continue to stop smoking Offered nicotine replacement therapies today, patient declined Patient maintained on Zyban, continue Referral to lung cancer screening program

## 2018-10-06 NOTE — Patient Instructions (Addendum)
Doxycycline >>> 1 100 mg tablet every 12 hours for 7 days >>>take with food  >>>wear sunscreen   Prednisone 10mg  tablet  >>>Take 2 tablets (20 mg total) daily for the next 5 days >>> Take with food in the morning  Only use your albuterol as a rescue medication to be used if you can't catch your breath by resting or doing a relaxed purse lip breathing pattern.  - The less you use it, the better it will work when you need it. - Ok to use up to 2 puffs  every 4 hours if you must but call for immediate appointment if use goes up over your usual need - Don't leave home without it !!  (think of it like the spare tire for your car)    Note your daily symptoms > remember "red flags" for COPD:   >>>Increase in cough >>>increase in sputum production >>>increase in shortness of breath or activity  intolerance.   If you notice these symptoms, please call the office to be seen.    We recommend that you stop smoking.  >>>You need to set a quit date >>>If you have friends or family who smoke, let them know you are trying to quit and not to smoke around you or in your living environment  Smoking Cessation Resources:  1 800 QUIT NOW  >>> Patient to call this resource and utilize it to help support her quit smoking >>> Keep up your hard work with stopping smoking  You can also contact the [facility] >>>For smoking cessation classes call [phone]  We do not recommend using e-cigarettes as a form of stopping smoking  You can sign up for smoking cessation support texts and information:  >>>https://smokefree.gov/smokefreetxt   We will refer you today to her lung cancer screening program >>>This is based off of your 50 pack-year smoking history >>> This is a recommendation from the [place] preventative services task force (USPSTF) >>>The USPSTF recommends annual screening for lung cancer with low-dose computed tomography (LDCT) in adults aged 49 to 2 years who have a 30 pack-year  smoking history and currently smoke or have quit within the past 15 years. Screening should be discontinued once a person has not smoked for 15 years or develops a health problem that substantially limits life expectancy or the ability or willingness to have curative lung surgery.   Our office will call you and set up an appointment with Eric Form (Nurse Practitioner) who leads this program.  This appointment takes place in our office.  After completing this meeting with Eric Form NP you will get a low-dose CT as the screening >>>We will call you with those results  Return in about 4 weeks (around 11/03/2018), or if symptoms worsen or fail to improve, for Follow up with Wyn Quaker FNP-C, Follow up with Dr. Halford Chessman.   Coronavirus (COVID-19) Are you at risk?  Are you at risk for the Coronavirus (COVID-19)?  To be considered HIGH RISK for Coronavirus (COVID-19), you have to meet the following criteria:  . Traveled to [place], [place], [place], [place] or [place]; or in the [place] to [place], [place], [place], or [place]; and have fever, cough, and shortness of breath within the last 2 weeks of travel OR . Been in close contact with a person diagnosed with COVID-19 within the last 2 weeks and have fever, cough, and shortness of breath . IF YOU DO NOT MEET THESE CRITERIA, YOU ARE CONSIDERED LOW RISK  FOR COVID-19.  What to do if you are HIGH RISK for COVID-19?  Marland Kitchen If you are having a medical emergency, call 911. . Seek medical care right away. Before you go to a doctor's office, urgent care or emergency department, call ahead and tell them about your recent travel, contact with someone diagnosed with COVID-19, and your symptoms. You should receive instructions from your physician's office regarding next steps of care.  . When you arrive at healthcare provider, tell the healthcare staff immediately you have returned from visiting [place], [place], [place], [place] or [place]; or traveled in  the [place] to [place], [place], [place], or [place]; in the last two weeks or you have been in close contact with a person diagnosed with COVID-19 in the last 2 weeks.   . Tell the health care staff about your symptoms: fever, cough and shortness of breath. . After you have been seen by a medical provider, you will be either: o Tested for (COVID-19) and discharged home on quarantine except to seek medical care if symptoms worsen, and asked to  - Stay home and avoid contact with others until you get your results (4-5 days)  - Avoid travel on public transportation if possible (such as bus, train, or airplane) or o Sent to the Emergency Department by EMS for evaluation, COVID-19 testing, and possible admission depending on your condition and test results.  What to do if you are LOW RISK for COVID-19?  Reduce your risk of any infection by using the same precautions used for avoiding the common cold or flu:  Marland Kitchen Wash your hands often with soap and warm water for at least 20 seconds.  If soap and water are not readily available, use an alcohol-based hand sanitizer with at least 60% alcohol.  . If coughing or sneezing, cover your mouth and nose by coughing or sneezing into the elbow areas of your shirt or coat, into a tissue or into your sleeve (not your hands). . Avoid shaking hands with others and consider head nods or verbal greetings only. . Avoid touching your eyes, nose, or mouth with unwashed hands.  . Avoid close contact with people who are sick. . Avoid places or events with large numbers of people in one location, like concerts or sporting events. . Carefully consider travel plans you have or are making. . If you are planning any travel outside or inside the [place], visit the CDC's Travelers' Health webpage for the latest health notices. . If you have some symptoms but not all symptoms, continue to monitor at home and seek medical attention if your symptoms worsen. . If you are  having a medical emergency, call 911.   [place] / e-Visit: [email or web address]         [facility]: [facility]: [phone]                   [facility]: [phone]           It is flu season:   >>> Best ways to protect herself from the flu: Receive the yearly flu vaccine, practice good hand hygiene washing with soap and also using hand sanitizer when available, eat a nutritious meals, get adequate rest, hydrate appropriately   Please contact the office if your symptoms worsen or you have concerns that you are not improving.   Thank you for choosing [facility] for  your healthcare, and for allowing [place] to partner with you on your healthcare journey. I am thankful to be able to provide care to you today.   Wyn Quaker FNP-C    Chronic Obstructive Pulmonary Disease Exacerbation Chronic obstructive pulmonary disease (COPD) is a long-term (chronic) lung problem. In COPD, the flow of air from the lungs is limited. COPD exacerbations are times that breathing gets worse and you need more than your normal treatment. Without treatment, they can be life threatening. If they happen often, your lungs can become more damaged. If your COPD gets worse, your doctor may treat you with:  Medicines.  Oxygen.  Different ways to clear your airway, such as using a mask. Follow these instructions at home: Medicines  Take over-the-counter and prescription medicines only as told by your doctor.  If you take an antibiotic or steroid medicine, do not stop taking the medicine even if you start to feel better.  Keep up with shots (vaccinations) as told by your doctor. Be sure to get a yearly (annual) flu shot. Lifestyle  Do not smoke. If you need help quitting, ask your doctor.  Eat healthy foods.  Exercise regularly.  Get  plenty of sleep.  Avoid tobacco smoke and other things that can bother your lungs.  Wash your hands often with soap and water. This will help keep you from getting an infection. If you cannot use soap and water, use hand sanitizer.  During flu season, avoid areas that are crowded with people. General instructions  Drink enough fluid to keep your pee (urine) clear or pale yellow. Do not do this if your doctor has told you not to.  Use a cool mist machine (vaporizer).  If you use oxygen or a machine that turns medicine into a mist (nebulizer), continue to use it as told.  Follow all instructions for rehabilitation. These are steps you can take to make your body work better.  Keep all follow-up visits as told by your doctor. This is important. Contact a doctor if:  Your COPD symptoms get worse than normal. Get help right away if:  You are short of breath and it gets worse.  You have trouble talking.  You have chest pain.  You cough up blood.  You have a fever.  You keep throwing up (vomiting).  You feel weak or you pass out (faint).  You feel confused.  You are not able to sleep because of your symptoms.  You are not able to do daily activities. Summary  COPD exacerbations are times that breathing gets worse and you need more treatment than normal.  COPD exacerbations can be very serious and may cause your lungs to become more damaged.  Do not smoke. If you need help quitting, ask your doctor.  Stay up-to-date on your shots. Get a flu shot every year. This information is not intended to replace advice given to you by your health care provider. Make sure you discuss any questions you have with your health care provider. Document Released: 04/04/2011 Document Revised: 05/20/2016 Document Reviewed: 05/20/2016 Elsevier Interactive Patient Education  2019 [employer].

## 2018-10-08 ENCOUNTER — Telehealth: Payer: Self-pay | Admitting: Pulmonary Disease

## 2018-10-08 NOTE — Telephone Encounter (Signed)
From pt's OV with [name] 6/9, pt is supposed to take 2tablets of prednisone making it a total of 20mg  for a total of 5 days.  Called and spoke with pt's wife [name] stating to her the instructions for how pt is to take the prednisone and she verbalized understanding. Nothing further needed.

## 2018-10-16 ENCOUNTER — Telehealth: Payer: Self-pay | Admitting: Pulmonary Disease

## 2018-10-16 NOTE — Telephone Encounter (Signed)
Returned call to patient and wife.  Patient completed doxycycline 10/13/18 (3 days ago) and has completed prednisone - (5 day course).  Patient developed diarrhea but it is somewhat improved today.  Had loose stools 5-6 times yesterday and 2 times today and thinks was due to antibiotic.  Denies fever, chills, joint pain, loss of smell or any other new symptoms.    Continues SOB with activity which has lessened after recent treatment (doxy and pred) but is still 'worse than my usual.'  Patient states Wyn Quaker, NP had mentioned the patient might try an inhaler in the future and wonders if he needs this now or if there are any other recommendations.  Patient has used albuterol a few times with some relief, but not everyday.  General feeling of malaise.    Primary Pulmonologist: Sood Last office visit and with whom: 10/06/18 Warner Mccreedy What do we see them for (pulmonary problems): COPD Last OV assessment/plan: Former smoker (quit 6 weeks ago) Plan: Continue to stop smoking Offered nicotine replacement therapies today, patient declined Patient maintained on Zyban, continue Referral to lung cancer screening program  COPD mixed type [facility]) Assessment: January/2020 spirometry with DLCO shows an FEV1 of 2.35 (64% predicted), DLCO 60% Alpha-1 levels in January/20 - 167 mMRC 3 today Patient reporting audible wheezing occasionally as well as increased shortness of breath and increased cough and increased sputum production which is clear Not on any sort of maintenance inhalers at this time Using his rescue inhaler 2-3 times daily with no relief  Plan: Doxycycline today Prednisone today Congratulations on stopping smoking Keep scheduled follow-up with our office in July/2020 Consider starting a trial of maintenance inhalers Continue rescue inhaler as needed Referral to lung cancer screening program  Was appointment offered to patient (explain)?  Virtual offered today - declined   Reason for call:  wants to know if something could be called into pharmacy for SOB  Will route to T. Parrett, NP (app of the day) for review and recommendations.   Tammy please advise.  Thank you

## 2018-10-16 NOTE — Telephone Encounter (Signed)
Diarrhea may be due to antibiotic, should get better off abx , if not will need evaluation with PCP  Would eat yogurt Twice daily  And begin probiotic . Advance bland diet as tolerated.  If breathing is not better or getting worse will need ov to sort out  If slowly getting better may take few more days , if getting worse needs ov or ER  mucinex DM Twice daily  .As needed  Cough/congestion   Please contact office for sooner follow up if symptoms do not improve or worsen or seek emergency care

## 2018-10-16 NOTE — Telephone Encounter (Signed)
Called and spoke to pt's wife, Joe Cole. Informed her of the recs per TP. Joe Cole requested a televisit with [name], NP, as they spoke last time regarding pt's care. Joe Cole denied the ability to do a video visit and requested a televisit. This has been scheduled with [name] on Monday 6/22. Joe Cole is aware that if pt's s/s get worse over the weekend to seek emergency care. Pt verbalized understanding and denied any further questions or concerns at this time.

## 2018-10-18 NOTE — Progress Notes (Signed)
Virtual Visit via Telephone Note  I connected with Joe Cole on 10/19/18 at  9:30 AM EDT by telephone and verified that I am speaking with the correct person using two identifiers.  Location: Patient: Home Provider: [facility] - [address], [place], [place], [place] [address]   I discussed the limitations, risks, security and privacy concerns of performing an evaluation and management service by telephone and the availability of in person appointments. I also discussed with the patient that there may be a patient responsible charge related to this service. The patient expressed understanding and agreed to proceed.  Patient consented to consult via telephone: Yes People present and their role in pt care: Pt   History of Present Illness: 68 y.o. male smoker with COPD/emphysema.   Smoking History: Former Smoker, quit 09/08/2018, 50+ pack year smoker. Maintenance: None Patient of Dr. Halford Chessman  Chief complaint: Shortness of Breath   68 year old male followed in our office for COPD and emphysema.  Patient with known COPD on January/2020 pulmonary function test as well as a DLCO 60.  Patient recently stopped smoking.  Patient reports that 5 weeks ago he tested negative for SARS-CoV-2 this was done via his primary care doctor at [facility].  Patient reports that after last treatment of doxycycline and steroids wheezing has improved as well as cough has improved.  Shortness of breath remains.  Patient reports shortness of breath has not worsened but has not improved.  Patient continues to be sedentary at home.  Patient reports that he has been practicing social distancing except he did go to the grocery store where he was able to walk those aisles without stopping and taking a break.  Patient did wear a mask.  He has been accurately handwashing.  Patient has been using his rescue inhaler 2 times daily.  They do not feel this is enough.  Patient denies weight increase or lower extremity  swelling.  Patient denies close fitting tighter than usual.  Patient is not on a maintenance inhaler but they are interested in discussing starting 1.  MMRC - Breathlessness Score 2 - on level ground, I walk slower than people of the same age because of breathlessness, or have to stop for breathe when walking to my own pace   [place] criteria: Clinical assessment for PE Clinical symptoms of DVT (leg swelling, pain with palpation) - 3 Other diagnoses less likely than pulmonary embolism - 3 Heart rate greater than 100 - 1.5 Immobilization (disease greater in 3 days) or surgery in the previous 4 weeks - 1.5 Previous DVT/PE - 1.5 Hemoptysis - 1 Malignancy - 1  Probability Traditional clinical probability assessment (Wells criteria) High - greater than 6 Moderate - 2 to 6 Low less than 2  Simplify clinical probability assessment (modified Wells criteria) PE likely-greater than 4 PE unlikely little less than or equal to 4  Pretest probability of DVT (Wells score)  Clinical features: Active cancer (treatment ongoing or within the previous 6 months or palliative) -1 Paralysis, paralysis, or recent plaster immobilization of the lower extremities -1 Recently bedridden for more than 3 days or major surgery within 4 weeks -1 Localized tenderness along the distribution of the deep vein system -1 Entire leg swollen -1 Calf swelling by more than 3 cm when compared to the asymptomatic leg (measured below tibial tuberosity) -1 Pitting edema (greater and symptomatic leg) -1 Collateral superficial veins -1 Alternative diagnosis as likely or more than likely that of a  deep vein thrombosis    -2  High probability - 3 or greater Moderate probability -  1 or 2 Low probability - 0 or less  Modification  DVT likely-2 or greater DVT unlikely-1 or less  Score: 0   Observations/Objective:  PFT 05/04/18 >> FEV1 2.35 (64%), FEV1% 59, TLC 9.00 (121%), RV 4.89 (197%), DLCO  60%  A1AT level 05/04/18 >> 167, MM   Assessment and Plan:  COPD mixed type [facility]) Assessment: January/2020 spirometry with DLCO shows FEV1 of 2.35 (64% predicted), DLCO 60 Alpha-1 levels in January/20 2167 mMRC 2 today Patient reports that wheezing as well as cough has improved since doxycycline and short course of steroids Patient is not using any maintenance inhalers at this time Patient using rescue inhaler 2 times daily with no relief  Plan: Trial of Anoro Ellipta today Patient to present to our office for Ellipta inhaler device teaching Continue to not smoke Can continue to use rescue inhaler as needed   Former smoker Plan: Continue to not smoke Continue Zyban at this time Referral to lung cancer screening program already placed  Shortness of breath Assessment: Shortness of breath remains despite treatment for COPD exacerbation Patient is not on any sort of maintenance inhalers at this time Low Wells score probability for DVT or PE No recent chest x-ray or lab work to further assess shortness of breath 5 weeks ago patient tested negative for SARS-CoV-2  Plan: Chest x-ray today Lab work today Low likelihood of PE but with persistent shortness of breath will still get a d-dimer today Trial of Anoro Ellipta    Follow Up Instructions:  Return in about 6 weeks (around 11/30/2018), or if symptoms worsen or fail to improve, for Follow up with Dr. Halford Chessman, Follow up with Wyn Quaker FNP-C.   I discussed the assessment and treatment plan with the patient. The patient was provided an opportunity to ask questions and all were answered. The patient agreed with the plan and demonstrated an understanding of the instructions.   The patient was advised to call back or seek an in-person evaluation if the symptoms worsen or if the condition fails to improve as anticipated.  I provided 24 minutes of non-face-to-face time during this encounter.   Lauraine Rinne, NP

## 2018-10-19 ENCOUNTER — Ambulatory Visit (INDEPENDENT_AMBULATORY_CARE_PROVIDER_SITE_OTHER): Payer: Medicare HMO

## 2018-10-19 ENCOUNTER — Telehealth: Payer: Self-pay | Admitting: Pulmonary Disease

## 2018-10-19 ENCOUNTER — Other Ambulatory Visit: Payer: Self-pay

## 2018-10-19 ENCOUNTER — Encounter: Payer: Self-pay | Admitting: Pulmonary Disease

## 2018-10-19 ENCOUNTER — Ambulatory Visit (INDEPENDENT_AMBULATORY_CARE_PROVIDER_SITE_OTHER): Payer: Medicare HMO | Admitting: Pulmonary Disease

## 2018-10-19 ENCOUNTER — Ambulatory Visit: Payer: Medicare HMO

## 2018-10-19 ENCOUNTER — Telehealth: Payer: Self-pay

## 2018-10-19 DIAGNOSIS — R0602 Shortness of breath: Secondary | ICD-10-CM | POA: Diagnosis not present

## 2018-10-19 DIAGNOSIS — Z87891 Personal history of nicotine dependence: Secondary | ICD-10-CM

## 2018-10-19 DIAGNOSIS — J449 Chronic obstructive pulmonary disease, unspecified: Secondary | ICD-10-CM

## 2018-10-19 LAB — CBC WITH DIFFERENTIAL/PLATELET
Basophils Absolute: 0.1 10*3/uL (ref 0.0–0.1)
Basophils Relative: 1.7 % (ref 0.0–3.0)
Eosinophils Absolute: 0.1 10*3/uL (ref 0.0–0.7)
Eosinophils Relative: 1.6 % (ref 0.0–5.0)
HCT: 38.7 % — ABNORMAL LOW (ref 39.0–52.0)
Hemoglobin: 13.2 g/dL (ref 13.0–17.0)
Lymphocytes Relative: 19.5 % (ref 12.0–46.0)
Lymphs Abs: 1.1 10*3/uL (ref 0.7–4.0)
MCHC: 34 g/dL (ref 30.0–36.0)
MCV: 110.6 fl — ABNORMAL HIGH (ref 78.0–100.0)
Monocytes Absolute: 0.5 10*3/uL (ref 0.1–1.0)
Monocytes Relative: 8.8 % (ref 3.0–12.0)
Neutro Abs: 4 10*3/uL (ref 1.4–7.7)
Neutrophils Relative %: 68.4 % (ref 43.0–77.0)
Platelets: 265 10*3/uL (ref 150.0–400.0)
RBC: 3.5 Mil/uL — ABNORMAL LOW (ref 4.22–5.81)
RDW: 15 % (ref 11.5–15.5)
WBC: 5.9 10*3/uL (ref 4.0–10.5)

## 2018-10-19 LAB — COMPREHENSIVE METABOLIC PANEL
ALT: 12 U/L (ref 0–53)
AST: 16 U/L (ref 0–37)
Albumin: 3.6 g/dL (ref 3.5–5.2)
Alkaline Phosphatase: 74 U/L (ref 39–117)
BUN: 10 mg/dL (ref 6–23)
CO2: 22 mEq/L (ref 19–32)
Calcium: 9 mg/dL (ref 8.4–10.5)
Chloride: 95 mEq/L — ABNORMAL LOW (ref 96–112)
Creatinine, Ser: 0.85 mg/dL (ref 0.40–1.50)
GFR: 89.92 mL/min (ref >60.00)
Glucose, Bld: 143 mg/dL — ABNORMAL HIGH (ref 70–99)
Potassium: 4 mEq/L (ref 3.5–5.1)
Sodium: 126 mEq/L — ABNORMAL LOW (ref 135–145)
Total Bilirubin: 1 mg/dL (ref 0.2–1.2)
Total Protein: 6.4 g/dL (ref 6.0–8.3)

## 2018-10-19 LAB — D-DIMER, QUANTITATIVE: D-Dimer, Quant: 0.98 mcg/mL FEU — ABNORMAL HIGH (ref <0.50)

## 2018-10-19 MED ORDER — AMOXICILLIN-POT CLAVULANATE 875-125 MG PO TABS: 1.0000 | ORAL_TABLET | Freq: Two times a day (BID) | ORAL | 0 refills | Status: DC

## 2018-10-19 MED ORDER — ANORO ELLIPTA 62.5-25 MCG/INH IN AEPB: 1.0000 | INHALATION_SPRAY | Freq: Every day | RESPIRATORY_TRACT | 0 refills | Status: AC

## 2018-10-19 NOTE — Assessment & Plan Note (Signed)
Assessment: January/2020 spirometry with DLCO shows FEV1 of 2.35 (64% predicted), DLCO 60 Alpha-1 levels in January/20 2167 mMRC 2 today Patient reports that wheezing as well as cough has improved since doxycycline and short course of steroids Patient is not using any maintenance inhalers at this time Patient using rescue inhaler 2 times daily with no relief  Plan: Trial of Anoro Ellipta today Patient to present to our office for Ellipta inhaler device teaching Continue to not smoke Can continue to use rescue inhaler as needed

## 2018-10-19 NOTE — Addendum Note (Signed)
Addended by: Suzzanne Cloud E on: 10/19/2018 02:37 PM   Modules accepted: Orders

## 2018-10-19 NOTE — Addendum Note (Signed)
Addended by: Joella Prince on: 10/19/2018 05:37 PM   Modules accepted: Orders

## 2018-10-19 NOTE — Assessment & Plan Note (Signed)
Assessment: Shortness of breath remains despite treatment for COPD exacerbation Patient is not on any sort of maintenance inhalers at this time Low Wells score probability for DVT or PE No recent chest x-ray or lab work to further assess shortness of breath 5 weeks ago patient tested negative for SARS-CoV-2  Plan: Chest x-ray today Lab work today Low likelihood of PE but with persistent shortness of breath will still get a d-dimer today Trial of Anoro Ellipta

## 2018-10-19 NOTE — Telephone Encounter (Signed)
Pt had televisit with Joe Cole this morning at 9:30. Per pt's televisit, cxr and labwork were ordered and also pt was to pick up sample of anoro at office when he came. Per addendum that was made by Lauren, pt did come by office to get sample of Anoro and was instructed how to use it.  Checked to see if I could see who tried to call pt but unable to find anything. Attempted to call pt or wife Joe Cole but unable to reach them. Left a detailed message with the info stated above and stated to them in the message once we had the results of both the labwork and cxr that we would call them with the results.nothing further needed.

## 2018-10-19 NOTE — Progress Notes (Signed)
Chest x-ray shows a potential pneumonia or mass.  We will further evaluate this with the CT Angio chest.  Please place order for:  Augmentin >>> Take 1 875-125 mg tablet every 12 hours for the next 10 days >>> Take with food  For potential pneumonia.   Wyn Quaker FNP

## 2018-10-19 NOTE — Progress Notes (Signed)
Elevated d-dimer.  There is a potential for a clot.  We will order a CT ANGIO chest to rule out pulmonary embolism.  This also will help further evaluate the chest x-ray findings. Please place order for CTA. Stat, hold for results at imaging.   Wyn Quaker FNP

## 2018-10-19 NOTE — Progress Notes (Signed)
Patient seen in the office today and instructed on use of Anoro Ellipta.  Patient expressed understanding and demonstrated technique.  Wife at demonstration as well both verbalized understanding. Nothing further needed.

## 2018-10-19 NOTE — Addendum Note (Signed)
Addended by: Spero Curb on: 10/19/2018 09:50 AM   Modules accepted: Orders

## 2018-10-19 NOTE — Assessment & Plan Note (Signed)
Plan: Continue to not smoke Continue Zyban at this time Referral to lung cancer screening program already placed

## 2018-10-19 NOTE — Telephone Encounter (Signed)
Result note re: results for CXR.  Augmentin order placed.  Patient notified.

## 2018-10-19 NOTE — Patient Instructions (Addendum)
Trial of Anoro Ellipta  >>> Take 1 puff daily in the morning right when you wake up >>>Rinse your mouth out after use >>>This is a daily maintenance inhaler, NOT a rescue inhaler >>>Contact our office if you are having difficulties affording or obtaining this medication >>>It is important for you to be able to take this daily and not miss any doses  Only use your albuterol as a rescue medication to be used if you can't catch your breath by resting or doing a relaxed purse lip breathing pattern.  - The less you use it, the better it will work when you need it. - Ok to use up to 2 puffs  every 4 hours if you must but call for immediate appointment if use goes up over your usual need - Don't leave home without it !!  (think of it like the spare tire for your car)   Patient to come into the office this week to be instructed on how to use the Ellipta inhaler device  Contact our office in 1 week and let us know how you are doing on the Anoro Ellipta  Chest x-ray and lab work when patient arrives to the office   If diarrhea returns please contact primary care and notify them    Return in about 6 weeks (around 11/30/2018), or if symptoms worsen or fail to improve, for Follow up with Dr. Halford Chessman, Follow up with Wyn Quaker FNP-C.   Coronavirus (COVID-19) Are you at risk?  Are you at risk for the Coronavirus (COVID-19)?  To be considered HIGH RISK for Coronavirus (COVID-19), you have to meet the following criteria:  . Traveled to [place], [place], [place], [place] or [place]; or in the [place] to [place], [place], [place], or [place]; and have fever, cough, and shortness of breath within the last 2 weeks of travel OR . Been in close contact with a person diagnosed with COVID-19 within the last 2 weeks and have fever, cough, and shortness of breath . IF YOU DO NOT MEET THESE CRITERIA, YOU ARE CONSIDERED LOW RISK FOR COVID-19.  What to do if you are HIGH RISK for COVID-19?  Marland Kitchen If you are  having a medical emergency, call 911. . Seek medical care right away. Before you go to a doctor's office, urgent care or emergency department, call ahead and tell them about your recent travel, contact with someone diagnosed with COVID-19, and your symptoms. You should receive instructions from your physician's office regarding next steps of care.  . When you arrive at healthcare provider, tell the healthcare staff immediately you have returned from visiting [place], [place], [place], [place] or [place]; or traveled in the [place] to [place], [place], [place], or [place]; in the last two weeks or you have been in close contact with a person diagnosed with COVID-19 in the last 2 weeks.   . Tell the health care staff about your symptoms: fever, cough and shortness of breath. . After you have been seen by a medical provider, you will be either: o Tested for (COVID-19) and discharged home on quarantine except to seek medical care if symptoms worsen, and asked to  - Stay home and avoid contact with others until you get your results (4-5 days)  - Avoid travel on public transportation if possible (such as bus, train, or airplane) or o Sent to the Emergency Department by EMS for evaluation, COVID-19 testing, and possible admission depending on your condition and test  results.  What to do if you are LOW RISK for COVID-19?  Reduce your risk of any infection by using the same precautions used for avoiding the common cold or flu:  Marland Kitchen Wash your hands often with soap and warm water for at least 20 seconds.  If soap and water are not readily available, use an alcohol-based hand sanitizer with at least 60% alcohol.  . If coughing or sneezing, cover your mouth and nose by coughing or sneezing into the elbow areas of your shirt or coat, into a tissue or into your sleeve (not your hands). . Avoid shaking hands with others and consider head nods or verbal greetings only. . Avoid touching your eyes, nose, or  mouth with unwashed hands.  . Avoid close contact with people who are sick. . Avoid places or events with large numbers of people in one location, like concerts or sporting events. . Carefully consider travel plans you have or are making. . If you are planning any travel outside or inside the [place], visit the CDC's Travelers' Health webpage for the latest health notices. . If you have some symptoms but not all symptoms, continue to monitor at home and seek medical attention if your symptoms worsen. . If you are having a medical emergency, call 911.   [place] / e-Visit: [email or web address]         [facility]: [facility]: [phone]                   [facility]: [phone]           It is flu season:   >>> Best ways to protect herself from the flu: Receive the yearly flu vaccine, practice good hand hygiene washing with soap and also using hand sanitizer when available, eat a nutritious meals, get adequate rest, hydrate appropriately   Please contact the office if your symptoms worsen or you have concerns that you are not improving.   Thank you for choosing [facility] for your healthcare, and for allowing [place] to partner with you on your healthcare journey. I am thankful to be able to provide care to you today.   Wyn Quaker FNP-C      Umeclidinium; Vilanterol inhalation powder What is this medicine? UMECLIDINIUM; VILANTEROL (ue MEK li DIN ee um; vye LAN ter ol) inhalation is a combination of two medicines that decrease inflammation and help to open up the airways of your lungs. It is for chronic obstructive pulmonary disease (COPD), including chronic bronchitis or emphysema. Do NOT use for asthma or an acute asthma attack. Do NOT use for a COPD attack. This medicine may be used for other purposes; ask your  health care provider or pharmacist if you have questions. COMMON BRAND NAME(S): ANORO ELLIPTA What should I tell my health care provider before I take this medicine? They need to know if you have any of these conditions: -bladder problems or difficulty passing urine -diabetes -glaucoma -heart disease or irregular heartbeat -high blood pressure -kidney disease -pheochromocytoma -prostate disease -seizures -thyroid disease -an unusual or allergic reaction to umeclidinium, vilanterol, lactose, milk proteins, other medicines, foods, dyes, or preservatives -pregnant or trying to get pregnant -breast-feeding How should I use this medicine? This medicine is inhaled through the mouth. It is used once per day. Follow the directions on the prescription label. Do not use a spacer device with this inhaler. Take your medicine  at regular intervals. Do not take your medicine more often than directed. Do not stop taking except on your doctor's advice. Make sure that you are using your inhaler correctly. Ask you doctor or health care provider if you have any questions. Talk to your pediatrician regarding the use of this medicine in children. Special care may be needed. Overdosage: If you think you have taken too much of this medicine contact a poison control center or emergency room at once. NOTE: This medicine is only for you. Do not share this medicine with others. What if I miss a dose? If you miss a dose, use it as soon as you can. If it is almost time for your next dose, use only that dose and continue with your regular schedule. Do not use double or extra doses. What may interact with this medicine? Do not take this medicine with any of the following medications: -cisapride -dofetilide -dronedarone -MAOIs like Carbex, Eldepryl, Marplan, Nardil, and Parnate -pimozide -thioridazine -ziprasidone This medicine may also interact with the following medications: -antihistamines for  allergy -antiviral medicines for HIV or AIDS -atropine -beta-blockers like metoprolol and propranolol -certain medicines for bladder problems like oxybutynin, tolterodine -certain medicines for depression, anxiety, or psychotic disturbances -certain medicines for Parkinson's disease like benztropine, trihexyphenidyl -certain medicines for stomach problems like dicyclomine, hyoscyamine -certain medicines for travel sickness like scopolamine -diuretics -ipratropium -medicines for colds -medicines for fungal infections like ketoconazole and itraconazole -other medicines for breathing problems -other medicines that prolong the QT interval (cause an abnormal heart rhythm) -tiotropium This list may not describe all possible interactions. Give your health care provider a list of all the medicines, herbs, non-prescription drugs, or dietary supplements you use. Also tell them if you smoke, drink alcohol, or use illegal drugs. Some items may interact with your medicine. What should I watch for while using this medicine? Visit your doctor or health care professional for regular checkups. Tell your doctor or health care professional if your symptoms do not get better. If your symptoms get worse or if you need your short-acting inhalers more often, call your doctor right away. Do not use this medicine more than once every 24 hours. What side effects may I notice from receiving this medicine? Side effects that you should report to your doctor or health care professional as soon as possible: -allergic reactions like skin rash or hives, swelling of the face, lips, or tongue -breathing problems right after inhaling your medicine -changes in vision -chest pain -eye pain -fast, irregular heartbeat -feeling faint or lightheaded, falls -fever or chills -nausea, vomiting -trouble passing urine or change in the amount of urine Side effects that usually do not require medical attention (report to your doctor  or health care professional if they continue or are bothersome): -constipation -cough -diarrhea -headache -muscle cramps -nervousness -sore throat -tremor This list may not describe all possible side effects. Call your doctor for medical advice about side effects. You may report side effects to FDA at 1-800-FDA-1088. Where should I keep my medicine? Keep out of the reach of children. Store at room temperature between 15 and 30 degrees C (59 and 86 degrees F). Store in a dry place away from direct heat or sunlight. Throw away 6 weeks after you remove the inhaler from the foil tray, or after the dose indicator reads 0, whichever comes first. Throw away any unopened packages after the expiration date. NOTE: This sheet is a summary. It may not cover all possible information. If you have  questions about this medicine, talk to your doctor, pharmacist, or health care provider.  2019 Elsevier/Gold Standard (2017-09-29 14:47:24)

## 2018-10-20 ENCOUNTER — Ambulatory Visit
Admission: RE | Admit: 2018-10-20 | Discharge: 2018-10-20 | Disposition: A | Discharge status: Home / Self Care | Payer: Medicare HMO | Source: Ambulatory Visit | Attending: Pulmonary Disease | Admitting: Pulmonary Disease

## 2018-10-20 ENCOUNTER — Telehealth: Payer: Self-pay | Admitting: Pulmonary Disease

## 2018-10-20 DIAGNOSIS — R911 Solitary pulmonary nodule: Secondary | ICD-10-CM

## 2018-10-20 DIAGNOSIS — R7989 Other specified abnormal findings of blood chemistry: Secondary | ICD-10-CM | POA: Diagnosis not present

## 2018-10-20 DIAGNOSIS — R0602 Shortness of breath: Secondary | ICD-10-CM

## 2018-10-20 MED ORDER — IOPAMIDOL (ISOVUE-370) INJECTION 76%
80.0000 mL | Freq: Once | INTRAVENOUS | Status: AC | PRN
  Administered 2018-10-20: 80 mL via INTRAVENOUS

## 2018-10-20 NOTE — Telephone Encounter (Signed)
Joe Cole wife calling to get results from CT scan today.  Joe Cole phone number is [phone].

## 2018-10-20 NOTE — Telephone Encounter (Signed)
Received a call report from [place] with [facility] in regards to the CTa pt had.  IMPRESSION: 1. No demonstrable pulmonary embolus. No thoracic aortic aneurysm or dissection. There is aortic and great vessel atherosclerotic calcification.  2. 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 There is airspace consolidation throughout the medial segment right middle lobe and to a lesser extent patchy airspace consolidation in portions of the right lower lobe. It is difficult to ascertain how much of the opacity in the right perihilar region represents tumor versus immediately adjacent adenopathy. There is felt to be adenopathy in the inferior hilum on the right measuring 2.2 x 1.8 cm. There is adenopathy anterior to the carina as well.  3.  Small right pleural effusion.  4. There is a degree of underlying centrilobular emphysematous change. There is left lower lobe bronchiectatic change.  Routing to [facility] as an [place].

## 2018-10-20 NOTE — Telephone Encounter (Signed)
10/20/2018 1637  I was able to reach the patient as well as his spouse [name].  We discussed the CT results.  They agreed to proceed forward with a bronchoscopy to further evaluate this right mass.  I will discuss the patient's case with Dr. Halford Chessman.  Then we can proceed forward with bronchoscopy.  Patient is not on any sort of blood thinners.  They would like to proceed forward and do this as soon as possible.  Routing to Dr. Halford Chessman as Juluis Rainier.  Wyn Quaker, FNP

## 2018-10-20 NOTE — Progress Notes (Signed)
I attempted to reach the patient and left a voicemail for them to contact us back.  CTA is negative for pulmonary embolism.  There is a mass in the right lung that I will follow-up with Dr. Halford Chessman regarding.  Likely patient will need a bronchoscopy.  If patient contact the office back please route called Wyn Quaker, FNP.  Lauren, please attempt to follow-up with the patient tomorrow 10/21/2018.  Wyn Quaker, FNP

## 2018-10-20 NOTE — Telephone Encounter (Signed)
I discussed with the patient's family.  Nothing further is needed.Wyn Quaker, FNP

## 2018-10-20 NOTE — Telephone Encounter (Signed)
Pt wife returing call to get results and can be reached @ [phone].Hillery Hunter

## 2018-10-20 NOTE — Progress Notes (Signed)
I was able to reach [place] as well as the patient.  We will work to proceed forward with a bronchoscopy.  I will follow-up with Dr. Halford Chessman regarding this.Wyn Quaker, FNP

## 2018-10-20 NOTE — Telephone Encounter (Signed)
Wyn Quaker FNP - attempted to reach patient. Left vm.  If patient calls back please keep on hold so that I can speak with them.Wyn Quaker, FNP

## 2018-10-21 NOTE — Telephone Encounter (Signed)
He needs to get a PET scan first to better assess extent of abnormalities and whether there are other locations that could be accessible to biopsy before proceeding with bronchoscopy.  Please arrange for PET scan and inform pt of plan.

## 2018-10-21 NOTE — Telephone Encounter (Signed)
Called spoke with patient and spouse [name] and discussed Dr Juanetta Gosling recommendations for PET scan prior to bronchoscopy.  Patient and [name] okay with this and would like the PET scheduled ASAP.  PET order placed under [facility] NP's name as authorized by himself.  Will route message to [facility] NP to call and discuss further with patient per his request.

## 2018-10-21 NOTE — Telephone Encounter (Signed)
Thank you.  I have contacted [name] and discussed the PET scan been ordered.  The PET scan is been scheduled on 10/29/2018.  Patient has scheduled follow-up with Dr. Halford Chessman on 11/05/2018.Nothing further is needed at this time.We will route to Dr. Halford Chessman and Ander Purpura as Juluis Rainier.  [name]

## 2018-10-26 ENCOUNTER — Telehealth: Payer: Self-pay | Admitting: *Deleted

## 2018-10-26 ENCOUNTER — Other Ambulatory Visit: Payer: Self-pay

## 2018-10-26 ENCOUNTER — Encounter: Payer: Self-pay | Admitting: Pulmonary Disease

## 2018-10-26 ENCOUNTER — Ambulatory Visit (INDEPENDENT_AMBULATORY_CARE_PROVIDER_SITE_OTHER): Payer: Medicare HMO | Admitting: Pulmonary Disease

## 2018-10-26 ENCOUNTER — Telehealth: Payer: Self-pay | Admitting: Pulmonary Disease

## 2018-10-26 DIAGNOSIS — R918 Other nonspecific abnormal finding of lung field: Secondary | ICD-10-CM | POA: Diagnosis not present

## 2018-10-26 DIAGNOSIS — J449 Chronic obstructive pulmonary disease, unspecified: Secondary | ICD-10-CM

## 2018-10-26 DIAGNOSIS — R509 Fever, unspecified: Secondary | ICD-10-CM | POA: Diagnosis not present

## 2018-10-26 DIAGNOSIS — Z20822 Contact with and (suspected) exposure to covid-19: Secondary | ICD-10-CM

## 2018-10-26 MED ORDER — BENZONATATE 200 MG PO CAPS: 200.0000 mg | ORAL_CAPSULE | Freq: Three times a day (TID) | ORAL | 1 refills | Status: DC | PRN

## 2018-10-26 MED ORDER — LEVOFLOXACIN 500 MG PO TABS: 500.0000 mg | ORAL_TABLET | Freq: Every day | ORAL | 0 refills | Status: DC

## 2018-10-26 NOTE — Telephone Encounter (Signed)
Scheduled televisit with BM at 430p.

## 2018-10-26 NOTE — Assessment & Plan Note (Signed)
Assessment: Fever started today despite antibiotic coverage 100.4 Patient reporting chills starting today Patient is finishing up an Augmentin prescription from the suspected pneumonia based off chest x-ray on 10/19/2018 Patient has been practicing social distancing has been quarantined at home Patient spouse has been running patient's errands and she wears a mask  Plan: Stop Augmentin Start Levaquin for 10 days Outpatient testing for COVID-19 to be completed by the [facility]

## 2018-10-26 NOTE — Assessment & Plan Note (Signed)
Assessment: January/2020 spirometry with DLCO shows FEV1 of 2.35 (64% predicted), DLCO 60 Alpha-1 levels in January/2020 167 Patient reporting increased shortness of breath as well as worsening cough Patient maintained on Anoro Ellipta Patient has not felt the Anoro Ellipta has helped him much  Plan: Continue Anoro Ellipta We will stop Augmentin today, start Levaquin Continue to not smoke Continue rescue inhaler

## 2018-10-26 NOTE — Assessment & Plan Note (Signed)
Assessment: June/2020 chest x-ray shows consolidation as well as potential obstructing mass 10/20/2018-CTA chest shows right mass Scheduled for PET scan to be completed on 10/29/2018  Plan: Continue forward with PET scan on 10/29/2018 Stop Augmentin, start Levaquin

## 2018-10-26 NOTE — Patient Instructions (Addendum)
Outpatient COVID19 test to be ordered   Start Levaquin 500mg  tablet >>> Take 1 500 mg tablet daily for the next 10 days >>> Take with food >>> start probiotic for good gut health >>>avoid rigorous exercise for next 2 weeks   STOP Augmentin today   Continue Anoro Ellipta  >>> Take 1 puff daily in the morning right when you wake up >>>Rinse your mouth out after use >>>This is a daily maintenance inhaler, NOT a rescue inhaler >>>Contact our office if you are having difficulties affording or obtaining this medication >>>It is important for you to be able to take this daily and not miss any doses   Continue PET Scan on 10/29/2018   No follow-ups on file.   Coronavirus (COVID-19) Are you at risk?  Are you at risk for the Coronavirus (COVID-19)?  To be considered HIGH RISK for Coronavirus (COVID-19), you have to meet the following criteria:  . Traveled to [place], [place], [place], [place] or [place]; or in the [place] to [place], [place], [place], or [place]; and have fever, cough, and shortness of breath within the last 2 weeks of travel OR . Been in close contact with a person diagnosed with COVID-19 within the last 2 weeks and have fever, cough, and shortness of breath . IF YOU DO NOT MEET THESE CRITERIA, YOU ARE CONSIDERED LOW RISK FOR COVID-19.  What to do if you are HIGH RISK for COVID-19?  Marland Kitchen If you are having a medical emergency, call 911. . Seek medical care right away. Before you go to a doctor's office, urgent care or emergency department, call ahead and tell them about your recent travel, contact with someone diagnosed with COVID-19, and your symptoms. You should receive instructions from your physician's office regarding next steps of care.  . When you arrive at healthcare provider, tell the healthcare staff immediately you have returned from visiting [place], [place], [place], [place] or [place]; or traveled in the [place] to [place], [place], [place],  or [place]; in the last two weeks or you have been in close contact with a person diagnosed with COVID-19 in the last 2 weeks.   . Tell the health care staff about your symptoms: fever, cough and shortness of breath. . After you have been seen by a medical provider, you will be either: o Tested for (COVID-19) and discharged home on quarantine except to seek medical care if symptoms worsen, and asked to  - Stay home and avoid contact with others until you get your results (4-5 days)  - Avoid travel on public transportation if possible (such as bus, train, or airplane) or o Sent to the Emergency Department by EMS for evaluation, COVID-19 testing, and possible admission depending on your condition and test results.  What to do if you are LOW RISK for COVID-19?  Reduce your risk of any infection by using the same precautions used for avoiding the common cold or flu:  Marland Kitchen Wash your hands often with soap and warm water for at least 20 seconds.  If soap and water are not readily available, use an alcohol-based hand sanitizer with at least 60% alcohol.  . If coughing or sneezing, cover your mouth and nose by coughing or sneezing into the elbow areas of your shirt or coat, into a tissue or into your sleeve (not your hands). . Avoid shaking hands with others and consider head nods or verbal greetings only. . Avoid touching your eyes, nose, or mouth  with unwashed hands.  . Avoid close contact with people who are sick. . Avoid places or events with large numbers of people in one location, like concerts or sporting events. . Carefully consider travel plans you have or are making. . If you are planning any travel outside or inside the [place], visit the CDC's Travelers' Health webpage for the latest health notices. . If you have some symptoms but not all symptoms, continue to monitor at home and seek medical attention if your symptoms worsen. . If you are having a medical emergency, call 911.   [place] / e-Visit: [email or web address]         [facility]: [facility]: [phone]                   [facility]: [phone]           It is flu season:   >>> Best ways to protect herself from the flu: Receive the yearly flu vaccine, practice good hand hygiene washing with soap and also using hand sanitizer when available, eat a nutritious meals, get adequate rest, hydrate appropriately   Please contact the office if your symptoms worsen or you have concerns that you are not improving.   Thank you for choosing [facility] for your healthcare, and for allowing [place] to partner with you on your healthcare journey. I am thankful to be able to provide care to you today.   Wyn Quaker FNP-C

## 2018-10-26 NOTE — Progress Notes (Signed)
Virtual Visit via Telephone Note  I connected with Joe Cole on 10/26/18 at  4:30 PM EDT by telephone and verified that I am speaking with the correct person using two identifiers.  Location: Patient: Home Provider: [facility] - [address], [place], [place], [place] [address]   I discussed the limitations, risks, security and privacy concerns of performing an evaluation and management service by telephone and the availability of in person appointments. I also discussed with the patient that there may be a patient responsible charge related to this service. The patient expressed understanding and agreed to proceed.  Patient consented to consult via telephone: Yes People present and their role in pt care: Pt   History of Present Illness: 68 y.o. male number smoker with COPD/emphysema.   Smoking History: Former Smoker, quit 09/08/2018, 50+ pack year smoker. Maintenance: None Patient of Dr. Halford Chessman  Chief complaint: Fever   68 year old male former smoker followed in our office for COPD/emphysema as well as recently been being worked up for a right lower lobe mass seen on 10/20/2018 CTA of chest.  Patient is currently scheduled to complete a PET scan on 10/29/2018.  Patient was also started on Augmentin last week due to suspected pneumonia.  Patient reports he continues to be short of breath and started having fever and chills today.  Temperature at home was 100.4.  Patient reports he also continues to cough.  This cough is nonproductive.  Patient is reporting he feels that he has been coughing more recently.  Observations/Objective:  PFT 05/04/18 >> FEV1 2.35 (64%), FEV1% 59, TLC 9.00 (121%), RV 4.89 (197%), DLCO 60%  A1AT level 05/04/18 >> 167, MM  10/19/2018-chest x-ray-consolidation atelectasis in right middle lobe with tiny right effusion this could represent pneumonia however possibility of an obstructing mass in the right middle lobe bronchus should be considered as  well  10/20/2018-CTA chest- no PE, apparent mass arising from right hilum with apparent tumor surrounding the proximal right middle and lower lobe bronchi, there is airspace consolidation throughout the medial segment right middle lobe to a lesser extent patchy airspace consolidation of portions of the right lower lobe, there is felt to be adenopathy in the inferior hilum on the right measuring 2.2 x 1.8 cm  10/19/2018-d-dimer-0.98  09/08/2018-SARS-CoV-2 test-negative Assessment and Plan:  Abnormal findings on diagnostic imaging of lung Assessment: June/2020 chest x-ray shows consolidation as well as potential obstructing mass 10/20/2018-CTA chest shows right mass Scheduled for PET scan to be completed on 10/29/2018  Plan: Continue forward with PET scan on 10/29/2018 Stop Augmentin, start Levaquin  Fever and chills Assessment: Fever started today despite antibiotic coverage 100.4 Patient reporting chills starting today Patient is finishing up an Augmentin prescription from the suspected pneumonia based off chest x-ray on 10/19/2018 Patient has been practicing social distancing has been quarantined at home Patient spouse has been running patient's errands and she wears a mask  Plan: Stop Augmentin Start Levaquin for 10 days Outpatient testing for COVID-19 to [facility] by the [facility]  COPD mixed type [facility]) Assessment: January/2020 spirometry with DLCO shows FEV1 of 2.35 (64% predicted), DLCO 60 Alpha-1 levels in January/2020 167 Patient reporting increased shortness of breath as well as worsening cough Patient maintained on Anoro Ellipta Patient has not felt the Anoro Ellipta has helped him much  Plan: Continue Anoro Ellipta We will stop Augmentin today, start Levaquin Continue to not smoke Continue rescue inhaler    Follow Up Instructions:  Return in about  2 weeks (around 11/09/2018), or if symptoms worsen or fail to improve, for Follow up with Dr. Halford Chessman, Follow up with Wyn Quaker FNP-C.   I discussed the assessment and treatment plan with the patient. The patient was provided an opportunity to ask questions and all were answered. The patient agreed with the plan and demonstrated an understanding of the instructions.   The patient was advised to call back or seek an in-person evaluation if the symptoms worsen or if the condition fails to improve as anticipated.  I provided 23 minutes of non-face-to-face time during this encounter.   Lauraine Rinne, NP

## 2018-10-26 NOTE — Telephone Encounter (Signed)
Testing referred by Tyler Aas. Pt scheduled for appt on 10/27/18 at [facility] site. Pt advised to wear a mask and remain in car at appt time. Understanding verbalized.

## 2018-10-27 ENCOUNTER — Other Ambulatory Visit: Payer: Medicare HMO

## 2018-10-27 DIAGNOSIS — Z20822 Contact with and (suspected) exposure to covid-19: Secondary | ICD-10-CM

## 2018-10-27 DIAGNOSIS — R6889 Other general symptoms and signs: Secondary | ICD-10-CM | POA: Diagnosis not present

## 2018-10-28 DIAGNOSIS — I4891 Unspecified atrial fibrillation: Secondary | ICD-10-CM

## 2018-10-28 HISTORY — DX: Unspecified atrial fibrillation: I48.91

## 2018-10-29 ENCOUNTER — Telehealth: Payer: Self-pay

## 2018-10-29 ENCOUNTER — Other Ambulatory Visit: Payer: Self-pay

## 2018-10-29 ENCOUNTER — Encounter (HOSPITAL_COMMUNITY)
Admission: RE | Admit: 2018-10-29 | Discharge: 2018-10-29 | Disposition: A | Discharge status: Home / Self Care | Payer: Medicare HMO | Source: Ambulatory Visit | Attending: Pulmonary Disease | Admitting: Pulmonary Disease

## 2018-10-29 DIAGNOSIS — J9811 Atelectasis: Secondary | ICD-10-CM | POA: Insufficient documentation

## 2018-10-29 DIAGNOSIS — J449 Chronic obstructive pulmonary disease, unspecified: Secondary | ICD-10-CM | POA: Diagnosis not present

## 2018-10-29 DIAGNOSIS — J90 Pleural effusion, not elsewhere classified: Secondary | ICD-10-CM | POA: Diagnosis not present

## 2018-10-29 DIAGNOSIS — Z79899 Other long term (current) drug therapy: Secondary | ICD-10-CM | POA: Diagnosis not present

## 2018-10-29 DIAGNOSIS — Z87891 Personal history of nicotine dependence: Secondary | ICD-10-CM | POA: Insufficient documentation

## 2018-10-29 DIAGNOSIS — R911 Solitary pulmonary nodule: Secondary | ICD-10-CM | POA: Insufficient documentation

## 2018-10-29 DIAGNOSIS — R918 Other nonspecific abnormal finding of lung field: Secondary | ICD-10-CM | POA: Diagnosis not present

## 2018-10-29 LAB — GLUCOSE, CAPILLARY: Glucose-Capillary: 92 mg/dL (ref 70–99)

## 2018-10-29 MED ORDER — FLUDEOXYGLUCOSE F - 18 (FDG) INJECTION
9.0100 | Freq: Once | INTRAVENOUS | Status: AC | PRN
  Administered 2018-10-29: 9.01 via INTRAVENOUS

## 2018-10-29 NOTE — Telephone Encounter (Signed)
Call report on PET scan: Erline Levine called results Hypermetabolic mass at the RIGHT hila with postobstructive collapse of RIGHT middle lobe. Findings most consistent with METASTATIC BRONCHOGENIC CARCINOMA.  Full report available in Epic  Routed to Dr. Halford Chessman for review

## 2018-10-30 ENCOUNTER — Telehealth: Payer: Self-pay | Admitting: Pulmonary Disease

## 2018-10-30 NOTE — Telephone Encounter (Signed)
Nm Pet Image Initial (pi) Skull Base To Thigh  Result Date: 10/29/2018 CLINICAL DATA:  Initial treatment strategy for RIGHT lung mass. EXAM: NUCLEAR MEDICINE PET SKULL BASE TO THIGH TECHNIQUE: 9.0 mCi F-18 FDG was injected intravenously. Full-ring PET imaging was performed from the skull base to thigh after the radiotracer. CT data was obtained and used for attenuation correction and anatomic localization. Fasting blood glucose: 92 mg/dl COMPARISON:  Chest CT October 20, 2018 FINDINGS: Mediastinal blood pool activity: SUV max 2.87 Liver activity: SUV max NA NECK: No hypermetabolic lymph nodes in the neck. Incidental CT findings: CHEST: Hypermetabolic mass at the RIGHT hilum measures 2.9 cm and is intensely hypermetabolic with SUV max equal 19.6. Mass obstructs the RIGHT middle lobe with complete collapse of the RIGHT middle lobe. Within the collapsed RIGHT middle lobe there is a focus of metabolic activity along the mediastinal/cardiac cardiac border with SUV max equal 4.8. This region activity measures approximately 1.5 cm but is not appreciated on CT portion exam. There is hypermetabolic activity associated with a ground-glass atelectasis in the periphery of the RIGHT lower lobe which is intense with SUV max equal 7.6 however there is no nodularity associated with this activity. Additional focus of hypermetabolic with thickening in the RIGHT lower lobe with SUV max equal 3.8 (image 52/8). No distinct hypermetabolic mediastinal lymph nodes. No LEFT lung lesion. Incidental CT findings: RIGHT pleural effusion. ABDOMEN/PELVIS: There is mild metabolic activity through the GE junction which is favored physiologic. No evidence of liver metastasis. Adrenal glands are normal. No hypermetabolic abdominopelvic lymph nodes. Incidental CT findings: Atherosclerotic calcification of the aorta. Diverticulosis of the sigmoid colon. SKELETON: No hypermetabolic lesions in the skeleton. Incidental CT findings: Severe degenerative  changes of lumbar spine IMPRESSION: 1. Hypermetabolic mass at the RIGHT hila with postobstructive collapse of RIGHT middle lobe. Findings most consistent with METASTATIC BRONCHOGENIC CARCINOMA. 2. Potential hypermetabolic nodule within the collapsed RIGHT middle lobe. 3. Intense metabolic activity in the RIGHT lower lobe is favored post obstructive inflammation/infection. 4. No metastatic mediastinal lymphadenopathy. 5. Small RIGHT effusion. These results will be called to the ordering clinician or representative by the Radiologist Assistant, and communication documented in the PACS or zVision Dashboard. Electronically Signed   By: Suzy Bouchard M.D.   On: 10/29/2018 09:56     Spoke with pt's wife.  Explained primary concern is still malignancy.  COVID test pending.  Will call back next week once bronchoscopy is set up.

## 2018-11-01 NOTE — Progress Notes (Signed)
PET scan postive. Dr. Halford Chessman spoke with patient. Pt to be set up for bronchoscopy.   [name]

## 2018-11-02 ENCOUNTER — Telehealth: Payer: Self-pay | Admitting: Pulmonary Disease

## 2018-11-02 LAB — NOVEL CORONAVIRUS, NAA: SARS-CoV-2, NAA: NOT DETECTED

## 2018-11-02 NOTE — Telephone Encounter (Signed)
Have scheduled patient for bronchoscopy at [facility] for Thursday, 11/05/18 at 1 pm.  Discussed with pt's wife.  Explained that bronchoscopy might be delayed if COVID test results are back before Thursday.

## 2018-11-02 NOTE — Telephone Encounter (Signed)
Called patient and spoke with spouse [name]. Made aware 7/9 appt would be cancelled and rescheduled after bronch results available. Nothing further needed.

## 2018-11-02 NOTE — Telephone Encounter (Signed)
Pt is scheduled for OV 7/9 at 11:45 with VS and then also saw pt's bronch appt for the same day 7/9 at 2pm.  Routing to VS as an FYI.

## 2018-11-02 NOTE — Progress Notes (Signed)
Sars cov2 test negative. Proceed forward with broncoscopy.    Wyn Quaker FNP

## 2018-11-02 NOTE — Telephone Encounter (Signed)
Please cancel ROV on 7/9.  I will schedule f/u with him after bronchoscopy results available.

## 2018-11-05 ENCOUNTER — Ambulatory Visit: Payer: Medicare HMO | Admitting: Pulmonary Disease

## 2018-11-05 ENCOUNTER — Other Ambulatory Visit: Payer: Self-pay

## 2018-11-05 ENCOUNTER — Encounter (HOSPITAL_COMMUNITY): Payer: Self-pay | Admitting: Respiratory Therapy

## 2018-11-05 ENCOUNTER — Encounter (HOSPITAL_COMMUNITY)
Admission: RE | Disposition: A | Discharge status: Home / Self Care | Payer: Self-pay | Source: Home / Self Care | Attending: Pulmonary Disease

## 2018-11-05 ENCOUNTER — Inpatient Hospital Stay (HOSPITAL_COMMUNITY): Payer: Medicare HMO

## 2018-11-05 ENCOUNTER — Inpatient Hospital Stay (HOSPITAL_COMMUNITY)
Admission: RE | Admit: 2018-11-05 | Discharge: 2018-11-09 | DRG: 167 | Disposition: A | Discharge status: Home / Self Care | Payer: Medicare HMO | Attending: Pulmonary Disease | Admitting: Pulmonary Disease

## 2018-11-05 ENCOUNTER — Ambulatory Visit (HOSPITAL_COMMUNITY)
Admission: RE | Admit: 2018-11-05 | Discharge: 2018-11-05 | Disposition: A | Discharge status: Home / Self Care | Payer: Medicare HMO | Source: Ambulatory Visit | Attending: Pulmonary Disease | Admitting: Pulmonary Disease

## 2018-11-05 DIAGNOSIS — C3431 Malignant neoplasm of lower lobe, right bronchus or lung: Principal | ICD-10-CM | POA: Diagnosis present

## 2018-11-05 DIAGNOSIS — J438 Other emphysema: Secondary | ICD-10-CM

## 2018-11-05 DIAGNOSIS — C342 Malignant neoplasm of middle lobe, bronchus or lung: Secondary | ICD-10-CM | POA: Diagnosis not present

## 2018-11-05 DIAGNOSIS — J440 Chronic obstructive pulmonary disease with acute lower respiratory infection: Secondary | ICD-10-CM | POA: Diagnosis not present

## 2018-11-05 DIAGNOSIS — Z87891 Personal history of nicotine dependence: Secondary | ICD-10-CM

## 2018-11-05 DIAGNOSIS — I4892 Unspecified atrial flutter: Secondary | ICD-10-CM | POA: Diagnosis present

## 2018-11-05 DIAGNOSIS — I472 Ventricular tachycardia: Secondary | ICD-10-CM | POA: Diagnosis not present

## 2018-11-05 DIAGNOSIS — E875 Hyperkalemia: Secondary | ICD-10-CM | POA: Diagnosis not present

## 2018-11-05 DIAGNOSIS — I351 Nonrheumatic aortic (valve) insufficiency: Secondary | ICD-10-CM | POA: Diagnosis present

## 2018-11-05 DIAGNOSIS — M1990 Unspecified osteoarthritis, unspecified site: Secondary | ICD-10-CM | POA: Diagnosis present

## 2018-11-05 DIAGNOSIS — J449 Chronic obstructive pulmonary disease, unspecified: Secondary | ICD-10-CM | POA: Diagnosis not present

## 2018-11-05 DIAGNOSIS — I480 Paroxysmal atrial fibrillation: Secondary | ICD-10-CM

## 2018-11-05 DIAGNOSIS — E871 Hypo-osmolality and hyponatremia: Secondary | ICD-10-CM | POA: Diagnosis not present

## 2018-11-05 DIAGNOSIS — Z825 Family history of asthma and other chronic lower respiratory diseases: Secondary | ICD-10-CM | POA: Diagnosis not present

## 2018-11-05 DIAGNOSIS — Z96652 Presence of left artificial knee joint: Secondary | ICD-10-CM | POA: Diagnosis present

## 2018-11-05 DIAGNOSIS — J439 Emphysema, unspecified: Secondary | ICD-10-CM | POA: Diagnosis not present

## 2018-11-05 DIAGNOSIS — R918 Other nonspecific abnormal finding of lung field: Secondary | ICD-10-CM

## 2018-11-05 DIAGNOSIS — I4811 Longstanding persistent atrial fibrillation: Secondary | ICD-10-CM | POA: Diagnosis present

## 2018-11-05 DIAGNOSIS — E785 Hyperlipidemia, unspecified: Secondary | ICD-10-CM | POA: Diagnosis not present

## 2018-11-05 DIAGNOSIS — I10 Essential (primary) hypertension: Secondary | ICD-10-CM | POA: Diagnosis present

## 2018-11-05 DIAGNOSIS — I4891 Unspecified atrial fibrillation: Secondary | ICD-10-CM | POA: Diagnosis not present

## 2018-11-05 DIAGNOSIS — I361 Nonrheumatic tricuspid (valve) insufficiency: Secondary | ICD-10-CM | POA: Diagnosis not present

## 2018-11-05 DIAGNOSIS — J988 Other specified respiratory disorders: Secondary | ICD-10-CM | POA: Diagnosis not present

## 2018-11-05 DIAGNOSIS — I4819 Other persistent atrial fibrillation: Secondary | ICD-10-CM | POA: Diagnosis not present

## 2018-11-05 DIAGNOSIS — Z79899 Other long term (current) drug therapy: Secondary | ICD-10-CM

## 2018-11-05 HISTORY — DX: Dyspnea, unspecified: R06.00

## 2018-11-05 HISTORY — DX: Emphysema, unspecified: J43.9

## 2018-11-05 HISTORY — PX: VIDEO BRONCHOSCOPY: SHX5072

## 2018-11-05 LAB — CBC WITH DIFFERENTIAL/PLATELET
Abs Immature Granulocytes: 0.03 10*3/uL (ref 0.00–0.07)
Basophils Absolute: 0.1 10*3/uL (ref 0.0–0.1)
Basophils Relative: 1 %
Eosinophils Absolute: 0.2 10*3/uL (ref 0.0–0.5)
Eosinophils Relative: 2 %
HCT: 37.6 % — ABNORMAL LOW (ref 39.0–52.0)
Hemoglobin: 13.4 g/dL (ref 13.0–17.0)
Immature Granulocytes: 0 %
Lymphocytes Relative: 17 %
Lymphs Abs: 1.3 10*3/uL (ref 0.7–4.0)
MCH: 37 pg — ABNORMAL HIGH (ref 26.0–34.0)
MCHC: 35.6 g/dL (ref 30.0–36.0)
MCV: 103.9 fL — ABNORMAL HIGH (ref 80.0–100.0)
Monocytes Absolute: 0.5 10*3/uL (ref 0.1–1.0)
Monocytes Relative: 7 %
Neutro Abs: 5.4 10*3/uL (ref 1.7–7.7)
Neutrophils Relative %: 73 %
Platelets: 241 10*3/uL (ref 150–400)
RBC: 3.62 MIL/uL — ABNORMAL LOW (ref 4.22–5.81)
RDW: 14.1 % (ref 11.5–15.5)
WBC: 7.5 10*3/uL (ref 4.0–10.5)
nRBC: 0 % (ref 0.0–0.2)

## 2018-11-05 LAB — MAGNESIUM: Magnesium: 1.4 mg/dL — ABNORMAL LOW (ref 1.7–2.4)

## 2018-11-05 LAB — COMPREHENSIVE METABOLIC PANEL
ALT: 14 U/L (ref 0–44)
AST: 27 U/L (ref 15–41)
Albumin: 3.2 g/dL — ABNORMAL LOW (ref 3.5–5.0)
Alkaline Phosphatase: 61 U/L (ref 38–126)
Anion gap: 12 (ref 5–15)
BUN: 9 mg/dL (ref 8–23)
CO2: 22 mmol/L (ref 22–32)
Calcium: 9 mg/dL (ref 8.9–10.3)
Chloride: 98 mmol/L (ref 98–111)
Creatinine, Ser: 0.93 mg/dL (ref 0.61–1.24)
GFR calc Af Amer: >60 mL/min (ref >60)
GFR calc non Af Amer: >60 mL/min (ref >60)
Glucose, Bld: 134 mg/dL — ABNORMAL HIGH (ref 70–99)
Potassium: 5.1 mmol/L (ref 3.5–5.1)
Sodium: 132 mmol/L — ABNORMAL LOW (ref 135–145)
Total Bilirubin: 1.6 mg/dL — ABNORMAL HIGH (ref 0.3–1.2)
Total Protein: 6.1 g/dL — ABNORMAL LOW (ref 6.5–8.1)

## 2018-11-05 LAB — TROPONIN I (HIGH SENSITIVITY): Troponin I (High Sensitivity): 8 ng/L (ref <18)

## 2018-11-05 LAB — HEPARIN LEVEL (UNFRACTIONATED): Heparin Unfractionated: 0.37 IU/mL (ref 0.30–0.70)

## 2018-11-05 LAB — TSH: TSH: 3.171 u[IU]/mL (ref 0.350–4.500)

## 2018-11-05 SURGERY — BRONCHOSCOPY, WITH FLUOROSCOPY
Anesthesia: Moderate Sedation | Laterality: Bilateral

## 2018-11-05 MED ORDER — ATORVASTATIN CALCIUM 10 MG PO TABS
20.0000 mg | ORAL_TABLET | Freq: Every day | ORAL | Status: DC
  Administered 2018-11-06 – 2018-11-09 (×4): 20 mg via ORAL
  Filled 2018-11-05 (×4): qty 2

## 2018-11-05 MED ORDER — ACETAMINOPHEN 325 MG PO TABS: 650.0000 mg | ORAL_TABLET | Freq: Four times a day (QID) | ORAL | Status: DC | PRN

## 2018-11-05 MED ORDER — IPRATROPIUM BROMIDE 0.02 % IN SOLN: 0.5000 mg | RESPIRATORY_TRACT | Status: DC | PRN

## 2018-11-05 MED ORDER — HEPARIN BOLUS VIA INFUSION
4100.0000 [IU] | Freq: Once | INTRAVENOUS | Status: AC
  Administered 2018-11-05: 4100 [IU] via INTRAVENOUS
  Filled 2018-11-05: qty 4100

## 2018-11-05 MED ORDER — OFF THE BEAT BOOK
Freq: Once | Status: AC
  Administered 2018-11-05: 21:00:00
  Filled 2018-11-05: qty 1

## 2018-11-05 MED ORDER — SODIUM CHLORIDE 0.9 % IV SOLN
INTRAVENOUS | Status: DC | PRN
  Administered 2018-11-09: 1000 mL via INTRAVENOUS

## 2018-11-05 MED ORDER — DILTIAZEM HCL-DEXTROSE 100-5 MG/100ML-% IV SOLN (PREMIX)
5.0000 mg/h | INTRAVENOUS | Status: DC
  Administered 2018-11-05: 5 mg/h via INTRAVENOUS
  Administered 2018-11-06: 10 mg/h via INTRAVENOUS
  Filled 2018-11-05 (×2): qty 100

## 2018-11-05 MED ORDER — LEVALBUTEROL HCL 0.63 MG/3ML IN NEBU: 0.6300 mg | INHALATION_SOLUTION | RESPIRATORY_TRACT | Status: DC | PRN

## 2018-11-05 MED ORDER — HEPARIN (PORCINE) 25000 UT/250ML-% IV SOLN
1350.0000 [IU]/h | INTRAVENOUS | Status: AC
  Administered 2018-11-05 – 2018-11-07 (×3): 1200 [IU]/h via INTRAVENOUS
  Administered 2018-11-08: 1350 [IU]/h via INTRAVENOUS
  Filled 2018-11-05 (×6): qty 250

## 2018-11-05 MED ORDER — ATORVASTATIN CALCIUM 20 MG PO TABS
20.0000 mg | ORAL_TABLET | Freq: Every day | ORAL | Status: DC
  Filled 2018-11-05: qty 1

## 2018-11-05 MED ORDER — ATORVASTATIN CALCIUM 20 MG PO TABS: 20.0000 mg | ORAL_TABLET | Freq: Every day | ORAL | Status: DC

## 2018-11-05 NOTE — CV Procedure (Signed)
Heart rate too high for accurate echo at this time. 

## 2018-11-05 NOTE — Consult Note (Addendum)
Cardiology Consultation:   Patient ID: Joe Cole MRN: [id number]; DOB: 20-Apr-1951  Admit date: 11/05/2018 Date of Consult: 11/05/2018  Primary Care Provider: [name], MD Primary Cardiologist: New Primary Electrophysiologist:  None    Patient Profile:   Joe Cole is a 68 y.o. male, former smoker, with a hx of HTN, HLD, COPD w/ emphysema and newly diagnosed Rt lung mass, who presented today for bronchoscopy and found to be in atrial fibrillation w/ RVR. Cardiology consulted for management of atrial fibrillation, at the request of Dr. Halford Chessman, Internal Medicine.   History of Present Illness:   Joe Cole a 68 y.o. male, former smoker, with a hx of HTN, HLD, COPD w/ emphysema and newly diagnosed Rt lung mass, who presented today for bronchoscopy and found to be in atrial fibrillation w/ RVR. He was completely asymptomatic but rates in 140s. Bronchoscopy was aborted.  Admitted by Pulmonology to tele unit and started on IV Cardizem and IV heparin. Labs ordered and pending (TSH, CBC, CMP, Mg and Hs Trop). Cardiology consulted for management of atrial fibrillation, at the request of Dr. Halford Chessman, Internal Medicine.   Patient denies any prior cardiac history.  No family history of cardiac disease.  He has multiple cardiac risk factors including history of tobacco use, hypertension and hyperlipidemia.  He was a smoker for 35 years but recently quit about 2 1/2 months ago.  He has had recent worsening dyspnea over the last 3 weeks but denies any chest pain. No palpitations, LEE, syncope/ near syncope.   He is completely asymptomatic with his arrhythmia.  Current rates fluctuating between the 130s to 160s.  Blood pressure is stable at 128/85.  He is mentating well. RN has just started IV Cardizem drip.   He denies any prior history of stroke/TIA.  No history of abnormal bleeding or falls.  He admits to alcohol use.  He drinks 1 or 2 beers in the evenings.  Drinks 1 cup of coffee daily.  No  other caffeine.   Heart Pathway Score:     Past Medical History:  Diagnosis Date  . Arthritis   . COPD with emphysema ([place])   . Hypertension     Past Surgical History:  Procedure Laterality Date  . KNEE ARTHROSCOPY     BIL   . TOE SURGERY     PINNED LEFT BIG TOE  . TONSILLECTOMY     T+A  AS CHILD  . TOTAL KNEE ARTHROPLASTY Left 08/19/2017   Procedure: TOTAL KNEE ARTHROPLASTY;  Surgeon: Renette Butters, MD;  Location: [place] [facility];  Service: Orthopedics;  Laterality: Left;     Home Medications:  Prior to Admission medications   Medication Sig Start Date End Date Taking? Authorizing Provider  albuterol (PROAIR HFA) 108 (90 Base) MCG/ACT inhaler Inhale 2 puffs into the lungs every 6 (six) hours as needed for wheezing or shortness of breath. 03/18/18  Yes [name], MD  atorvastatin (LIPITOR) 20 MG tablet Take 20 mg by mouth daily.   Yes [provider]  diphenhydramine-acetaminophen (TYLENOL PM) 25-500 MG TABS tablet Take 2 tablets by mouth at bedtime as needed (sleep).    Yes [provider]  ibuprofen (ADVIL) 200 MG tablet Take 400 mg by mouth every 6 (six) hours as needed for headache or moderate pain.   Yes [provider]  losartan-hydrochlorothiazide (HYZAAR) 100-25 MG tablet Take 1 tablet by mouth daily.   Yes [provider]  Probiotic Product (ALIGN) 4 MG CAPS Take 4 mg  by mouth daily.   Yes [provider]  umeclidinium-vilanterol (ANORO ELLIPTA) 62.5-25 MCG/INH AEPB Inhale 1 puff into the lungs daily.   Yes [provider]  amLODipine (NORVASC) 10 MG tablet Take 10 mg by mouth daily.    [provider]    Inpatient Medications: Scheduled Meds: . [START ON 11/06/2018] atorvastatin  20 mg Oral Daily  . heparin  4,100 Units Intravenous Once   Continuous Infusions: . sodium chloride    . diltiazem (CARDIZEM) infusion    . heparin     PRN Meds: sodium chloride, acetaminophen, ipratropium, levalbuterol   Allergies:   No Known Allergies  Social History:   Social History   Socioeconomic History  . Marital status: Married    Spouse name: Not on file  . Number of children: Not on file  . Years of education: Not on file  . Highest education level: Not on file  Occupational History  . Not on file  Social Needs  . Financial resource strain: Not on file  . Food insecurity    Worry: Not on file    Inability: Not on file  . Transportation needs    Medical: Not on file    Non-medical: Not on file  Tobacco Use  . Smoking status: Former Smoker    Packs/day: 1.50    Years: 46.00    Pack years: 69.00    Types: Cigarettes    Quit date: 09/08/2018    Years since quitting: 0.1  . Smokeless tobacco: Never Used  Substance and Sexual Activity  . Alcohol use: Yes    Alcohol/week: 19.0 standard drinks    Types: 7 Cans of beer, 12 Shots of liquor per week    Comment: FEW DRINKS AFTER WORK  . Drug use: Never  . Sexual activity: Not on file  Lifestyle  . Physical activity    Days per week: Not on file    Minutes per session: Not on file  . Stress: Not on file  Relationships  . Social Herbalist on phone: Not on file    Gets together: Not on file    Attends religious service: Not on file    Active member of club or organization: Not on file    Attends meetings of clubs or organizations: Not on file    Relationship status: Not on file  . Intimate partner violence    Fear of current or ex partner: Not on file    Emotionally abused: Not on file    Physically abused: Not on file    Forced sexual activity: Not on file  Other Topics Concern  . Not on file  Social History Narrative  . Not on file    Family History:    Family History  Problem Relation Age of Onset  . Atrial fibrillation Mother   . Emphysema Father   . Pneumonia Father      ROS:  Please see the history of present illness.   All other ROS reviewed and negative.     Physical Exam/Data:   Vitals:    11/05/18 1400 11/05/18 1507  BP:  128/85  Pulse:  (!) 140  Resp:  (!) 22  Temp:  98.7 F (37.1 C)  TempSrc:  Oral  SpO2:  98%  Weight: 83.9 kg   Height: 6\' 1"  (1.854 m)    No intake or output data in the 24 hours ending 11/05/18 1510 Last 3 Weights 11/05/2018 07/06/2018 05/04/2018  Weight (lbs)  185 lb 180 lb 180 lb  Weight (kg) 83.915 kg 81.647 kg 81.647 kg     Body mass index is 24.41 kg/m.  General:  Well nourished, well developed, in no acute distress HEENT: normal Lymph: no adenopathy Neck: no JVD Endocrine:  No thryomegaly Vascular: No carotid bruits; FA pulses 2+ bilaterally without bruits  Cardiac:  irregularly irregular rhythm, tachy rate Lungs: decreased BS in right lower lower, some mild expiratory wheezing. No crackles Abd: soft, nontender, no hepatomegaly  Ext: no edema Musculoskeletal:  No deformities, BUE and BLE strength normal and equal Skin: warm and dry  Neuro:  CNs 2-12 intact, no focal abnormalities noted Psych:  Normal affect   EKG:  The EKG was personally reviewed and demonstrates:  Atrial fibrillation w/ RVR 142, PVC no ST abnormalities.  Telemetry:  Telemetry was personally reviewed and demonstrates:  Atrial fibrillation 140s-160s   Relevant CV Studies: None   Laboratory Data:  High Sensitivity Troponin:  No results for input(s): TROPONINIHS in the last 720 hours.   Cardiac EnzymesNo results for input(s): TROPONINI in the last 168 hours. No results for input(s): TROPIPOC in the last 168 hours.  ChemistryNo results for input(s): NA, K, CL, CO2, GLUCOSE, BUN, CREATININE, CALCIUM, GFRNONAA, GFRAA, ANIONGAP in the last 168 hours.  No results for input(s): PROT, ALBUMIN, AST, ALT, ALKPHOS, BILITOT in the last 168 hours. HematologyNo results for input(s): WBC, RBC, HGB, HCT, MCV, MCH, MCHC, RDW, PLT in the last 168 hours. BNPNo results for input(s): BNP, PROBNP in the last 168 hours.  DDimer No results for input(s): DDIMER in the last 168 hours.    Radiology/Studies:  No results found.  Assessment and Plan:   TARREN VELARDI is a 68 y.o. male, former smoker, with a hx of COPD w/ emphysema and newly diagnosed Rt lung mass, who presented today for bronchoscopy and found to be in atrial fibrillation w/ RVR. Cardiology consulted for management of atrial fibrillation, at the request of Dr. Halford Chessman, Internal Medicine.   1.  New onset atrial fibrillation with RVR: Patient is completely asymptomatic.  Current rates fluctuating in the 130s to 160s.  Blood pressure stable in the 678L systolic.  He is mentating well. No signs of volume overload.  IV Cardizem has just been initiated.  We will monitor response closely on telemetry.  Given his underlying lung disease and COPD will avoid use of beta-blockers.  Will titrate Cardizem as needed and he may even require short-term amiodarone if rates are difficult to control. Avoid use of albuterol due to risk of reflex tachycardia. Xopenex preferred.  We will also follow-up on labs.  CBC, comprehensive metabolic panel, magnesium, TSH and troponins have been ordered by primary team.  Once his rates are better controlled, we will plan to get an echocardiogram to assess LV function, chamber size and wall motion.  Agree with IV heparin for the time being for anticoagulation.  His CHA2DS2 VASc score is at least 2 for HTN and Age 71-74. This patients CHA2DS2-VASc Score and unadjusted Ischemic Stroke Rate (% per year) is equal to 2.2 % stroke rate/year from a score of 2. Would recommend oral anticoagulation, once all invasive procedures/ surgeries are completed.   Above score calculated as 1 point each if present [CHF, HTN, DM, Vascular=MI/PAD/Aortic Plaque, Age if 65-74, or Male] Above score calculated as 2 points each if present [Age > 75, or Stroke/TIA/TE]   2.  Right-sided lung mass: Plan was for bronchoscopy today but procedure was aborted/postponed due  to atrial fibrillation with RVR.  3.  History of tobacco use:  Smoker x35 years but recently quit 2.5 years ago  4.  Hypertension: Patient takes amlodipine however will need to DC this given that he is now on IV Cardizem for atrial fibrillation.  Plan to resume losartan/hydrochlorothiazide pending lab results (renal function and K) and blood pressure.  5.  Hyperlipidemia: on Lipitor 20 as outpatient.     For questions or updates, please contact [place] Please consult [email or web address] for contact info under     Signed, Lyda Jester, PA-C  11/05/2018 3:10 PM  Personally seen and examined. Agree with above.   68 year old here for bronchoscopy secondary to lung mass which was aborted after atrial fibrillation with rapid ventricular response was noted.  He has no prior history of A. fib.  Asymptomatic.  Smoker with hypertension hyperlipidemia nondiabetic.  GEN: Well nourished, well developed, in no acute distress  HEENT: normal  Neck: no JVD, carotid bruits, or masses Cardiac: Irreg irreg; no murmurs, rubs, or gallops,no edema  Respiratory: Minimal expiratory wheeze, normal work of breathing GI: soft, nontender, nondistended, + BS MS: no deformity or atrophy  Skin: warm and dry, no rash Neuro:  Alert and Oriented x 3, Strength and sensation are intact Psych: euthymic mood, full affect  EKG A. fib 142.  Telemetry personally reviewed shows A. fib with rapid ventricular response currently 122 bpm.  On diltiazem IV.  Echocardiogram pending  Assessment and plan:  Atrial fibrillation with rapid ventricular response paroxysmal Lung mass Hypertension Hyperlipidemia COPD  -IV diltiazem, increase to 15 mg in order to help with rate control.  Unsure how long he has been in atrial fibrillation.  He does not feel palpitations. -IV heparin being utilized for anticoagulation.  Hopefully he will be able to have bronchoscopy performed therefore we will keep the IV heparin going.  After bronchoscopy when safe, change to Eliquis.  Candee Furbish, MD

## 2018-11-05 NOTE — Progress Notes (Signed)
ANTICOAGULATION CONSULT NOTE - Initial Consult  Pharmacy Consult for heparin Indication: atrial fibrillation  No Known Allergies  Patient Measurements: Height: 6\' 1"  (185.4 cm) Weight: 185 lb (83.9 kg) IBW/kg (Calculated) : 79.9 Heparin Dosing Weight: 83.9 kg  Vital Signs:    Labs: No results for input(s): HGB, HCT, PLT, APTT, LABPROT, INR, HEPARINUNFRC, HEPRLOWMOCWT, CREATININE, CKTOTAL, CKMB, TROPONINIHS in the last 72 hours.  Estimated Creatinine Clearance: 96.6 mL/min (by C-G formula based on SCr of 0.85 mg/dL).   Medical History: Past Medical History:  Diagnosis Date  . Arthritis   . COPD with emphysema ([place])   . Hypertension     Medications:  Scheduled:  . atorvastatin  20 mg Oral Daily    Assessment: 35 yom with hx of Rt lung mass presenting for bronchoscopy and found to be in Afib with RVR. No PTA AC.  No s/sx of bleeding noted.  Goal of Therapy:  Heparin level 0.3-0.7 units/ml Monitor platelets by anticoagulation protocol: Yes   Plan:  Give 4100 units bolus x 1 Start heparin infusion at 1200 units/hr Check anti-Xa level in 6 hours and daily while on heparin Continue to monitor H&H and platelets  Antonietta Jewel, PharmD, [place] Clinical Pharmacist  Pager: [phone] Phone: [phone] 11/05/2018,2:50 PM

## 2018-11-05 NOTE — Progress Notes (Signed)
Bronchoscopy not done.  Patient admitted to cardiac tele for observation per MD request.

## 2018-11-05 NOTE — Progress Notes (Signed)
ANTICOAGULATION CONSULT NOTE - Follow Up Consult  Pharmacy Consult for heparin Indication: atrial fibrillation  Labs: Recent Labs    11/05/18 1629 11/05/18 2206  HGB 13.4  --   HCT 37.6*  --   PLT 241  --   HEPARINUNFRC  --  0.37  CREATININE 0.93  --   TROPONINIHS 8  --     Assessment/Plan:  68yo male therapeutic on heparin with initial dosing for Afib. Will continue gtt at current rate and confirm stable with am labs.   Wynona Neat, PharmD, BCPS  11/05/2018,11:08 PM

## 2018-11-05 NOTE — H&P (Addendum)
NAME:  Joe Cole, MRN:  [id number], DOB:  [birth year], LOS: 0 ADMISSION DATE:  11/05/2018, CHIEF COMPLAINT:  Tachycardia   Brief History   68 yo male former smoker with Rt lung mass and presented for bronchoscopy.  Found to have new onset A fib/flutter with RVR.  Bronchoscopy aborted and patient admitted to hospital.  History of present illness   68 yo male followed in pulmonary office for COPD with emphysema.  He had recent exacerbation and slow to improve.  CXR showed RML consolidation.  CT chest showed borderline LAN, emphysema, and mass in Rt perihilar region.  PET scan showed SUV 19.6 in Rt hilar region with obstruction of RML, and 7.6 SUV RLL.  He was scheduled for bronchoscopy.  On arrival he was connected to telemetry and found to have A fib/flutter with HR up to 160.  He was not aware of this.  No prior history.  Denies chest pain, dyspnea, nausea, dizziness, palpitations.  Past Medical History  HTN, Arthritis, COPD  Significant Hospital Events   7/09 Admit  Consults:  Cardiology  Procedures:    Significant Diagnostic Tests:  Echo 7/08 >>   Micro Data:    Antimicrobials:  COVID 10/27/18 >> Not detected  Interim history/subjective:    Objective   There were no vitals taken for this visit.       No intake or output data in the 24 hours ending 11/05/18 1426 There were no vitals filed for this visit.  Examination:  General - alert Eyes - pupils reactive ENT - no sinus tenderness, no stridor Cardiac - irregular, tachycardic, no murmur Chest - decreased BS, no wheeze/rales Abdomen - soft, non tender, + bowel sounds Extremities - no cyanosis, clubbing, or edema Skin - no rashes Neuro - normal strength, moves extremities, follows commands Lymphatics - no lymphadenopathy Psych - normal mood and behavior  ECG - A fib with HR 142   Resolved Hospital Problem list     Assessment & Plan:   New onset A fib with RVR. Plan - admit to telemetry - consult  cardiology - check cardiac enzymes, CMET, CBC, Mg, troponin, TSH, Echo - start heparin gtt - cardizem for rate control  Rt lung mass. Plan - will need to defer bronchoscopy until cardiac w/u completed  COPD with emphysema. Plan - prn atrovent, xopenex  Best practice:  Diet: heart healthy diet DVT prophylaxis: heparin gtt GI prophylaxis: not indicated Mobility: bed rest  Code Status: full code Family Communication: updated pt's wife by phone Disposition: admit to cardiac telemetry  Labs   CBC: No results for input(s): WBC, NEUTROABS, HGB, HCT, MCV, PLT in the last 168 hours.  Basic Metabolic Panel: No results for input(s): NA, K, CL, CO2, GLUCOSE, BUN, CREATININE, CALCIUM, MG, PHOS in the last 168 hours. GFR: CrCl cannot be calculated (Unknown ideal weight.). No results for input(s): PROCALCITON, WBC, LATICACIDVEN in the last 168 hours.  Liver Function Tests: No results for input(s): AST, ALT, ALKPHOS, BILITOT, PROT, ALBUMIN in the last 168 hours. No results for input(s): LIPASE, AMYLASE in the last 168 hours. No results for input(s): AMMONIA in the last 168 hours.  ABG No results found for: PHART, PCO2ART, PO2ART, HCO3, TCO2, ACIDBASEDEF, O2SAT   Coagulation Profile: No results for input(s): INR, PROTIME in the last 168 hours.  Cardiac Enzymes: No results for input(s): CKTOTAL, CKMB, CKMBINDEX, TROPONINI in the last 168 hours.  HbA1C: No results found for: HGBA1C  CBG: No results for input(s): GLUCAP in  the last 168 hours.  Review of Systems:   Reviewed and negative  Past Medical History  He,  has a past medical history of Arthritis and Hypertension.   Surgical History    Past Surgical History:  Procedure Laterality Date  . KNEE ARTHROSCOPY     BIL   . TOE SURGERY     PINNED LEFT BIG TOE  . TONSILLECTOMY     T+A  AS CHILD  . TOTAL KNEE ARTHROPLASTY Left 08/19/2017   Procedure: TOTAL KNEE ARTHROPLASTY;  Surgeon: Renette Butters, MD;  Location: [facility];  Service: Orthopedics;  Laterality: Left;     Social History   reports that he quit smoking about 8 weeks ago. His smoking use included cigarettes. He has a 69.00 pack-year smoking history. He has never used smokeless tobacco. He reports current alcohol use of about 19.0 standard drinks of alcohol per week. He reports that he does not use drugs.   Family History   His family history includes Atrial fibrillation in his mother; Emphysema in his father; Pneumonia in his father.   Allergies No Known Allergies   Home Medications  Prior to Admission medications   Medication Sig Start Date End Date Taking? Authorizing Provider  albuterol (PROAIR HFA) 108 (90 Base) MCG/ACT inhaler Inhale 2 puffs into the lungs every 6 (six) hours as needed for wheezing or shortness of breath. 03/18/18  Yes [name], MD  amLODipine (NORVASC) 10 MG tablet Take 10 mg by mouth daily.   Yes [provider]  atorvastatin (LIPITOR) 20 MG tablet Take 20 mg by mouth daily.   Yes [provider]  diphenhydramine-acetaminophen (TYLENOL PM) 25-500 MG TABS tablet Take 2 tablets by mouth at bedtime as needed (sleep).    Yes [provider]  ibuprofen (ADVIL) 200 MG tablet Take 400 mg by mouth every 6 (six) hours as needed for headache or moderate pain.   Yes [provider]  levofloxacin (LEVAQUIN) 500 MG tablet Take 1 tablet (500 mg total) by mouth daily. 10/26/18  Yes Lauraine Rinne, NP  losartan-hydrochlorothiazide (HYZAAR) 100-25 MG tablet Take 1 tablet by mouth daily.   Yes [provider]  Probiotic Product (ALIGN) 4 MG CAPS Take 4 mg by mouth daily.   Yes [provider]  umeclidinium-vilanterol (ANORO ELLIPTA) 62.5-25 MCG/INH AEPB Inhale 1 puff into the lungs daily.   Yes [provider]  amLODipine (NORVASC) 2.5 MG tablet Take 1 tablet (2.5 mg total) by mouth daily. Patient not taking: Reported on 11/02/2018 06/18/18   Park Liter, MD   amoxicillin-clavulanate (AUGMENTIN) 875-125 MG tablet Take 1 tablet by mouth 2 (two) times daily. Patient not taking: Reported on 11/02/2018 10/19/18   Lauraine Rinne, NP  benzonatate (TESSALON) 200 MG capsule Take 1 capsule (200 mg total) by mouth 3 (three) times daily as needed for cough. Patient not taking: Reported on 11/02/2018 10/26/18   Lauraine Rinne, NP  buPROPion (ZYBAN) 150 MG 12 hr tablet TAKE 1 TABLET BY MOUTH TWICE A DAY Patient not taking: Reported on 11/02/2018 07/06/18   [name], MD  doxycycline (VIBRA-TABS) 100 MG tablet Take 1 tablet (100 mg total) by mouth 2 (two) times daily. Patient not taking: Reported on 11/02/2018 10/06/18   Lauraine Rinne, NP  fluticasone [facility]) 50 MCG/ACT nasal spray Place 1 spray into both nostrils daily. Patient not taking: Reported on 11/02/2018 03/18/18   [name], MD  predniSONE (DELTASONE) 10 MG tablet Take 2 tablets (20mg   total) daily for the next 5 days. Take in the AM with food. Patient not taking: Reported on 11/02/2018 10/06/18   Lauraine Rinne, NP     [name], MD [place] 11/05/2018, 2:26 PM

## 2018-11-05 NOTE — H&P (Deleted)
[facility]    Constitutional:  BP 126/84 (BP Location: Left Arm, Patient Position: Sitting, Cuff Size: Normal)   Pulse 89   Ht 6' (1.829 m)   Wt 180 lb (81.6 kg)   SpO2 98%   BMI 24.41 kg/m   Past Medical History:  Arthritis, Hypertension  Brief Summary:  Joe Cole is a 68 y.o. male former smoker with COPD/emphysema.    He had recent therapy for COPD exacerbation.  Found to have Rt perihilar mass.  This was PET positive.    Physical Exam:   Appearance - well kempt   ENMT - clear nasal mucosa, midline nasal  septum, no oral exudates, no LAN, trachea midline  Respiratory - normal chest wall, normal respiratory effort, no accessory muscle use, no wheeze/rales  CV - s1s2 regular rate and rhythm, no murmurs, no peripheral edema, radial pulses symmetric  GI - soft, non tender, no masses  Lymph - no adenopathy noted in neck and axillary areas  MSK - normal gait  Ext - no cyanosis, clubbing, or joint inflammation noted  Skin - no rashes, lesions, or ulcers  Neuro - normal strength, oriented x 3  Psych - normal mood and affect   Assessment/Plan:   PET positive Rt perihilar mass and Rt lower lung. - schedule for bronchoscopy - risks detailed as bleeding, infection, pneumothorax and non diagnosis    Joe Mires, MD [place] [facility] Pager: [phone] 11/05/2018, 12:46 PM  Flow Sheet     Pulmonary tests:  PFT 05/04/18 >> FEV1 2.35 (64%), FEV1% 59, TLC 9.00 (121%), RV 4.89 (197%), DLCO 60% A1AT level 05/04/18 >> 167, MM  Medications:   Allergies as of 11/05/2018   No Known Allergies     Past Surgical History:  He  has a past surgical history that includes Knee arthroscopy; Toe Surgery; Tonsillectomy; and Total knee arthroplasty (Left, 08/19/2017).  Family History:  His family history includes Atrial fibrillation in his mother; Emphysema in his father; Pneumonia in his father.  Social History:   He  reports that he quit smoking about 8 weeks ago. His smoking use included cigarettes. He has a 69.00 pack-year smoking history. He has never used smokeless tobacco. He reports current alcohol use of about 19.0 standard drinks of alcohol per week. He reports that he does not use drugs.

## 2018-11-06 ENCOUNTER — Inpatient Hospital Stay (HOSPITAL_COMMUNITY): Payer: Medicare HMO

## 2018-11-06 DIAGNOSIS — I4819 Other persistent atrial fibrillation: Secondary | ICD-10-CM

## 2018-11-06 DIAGNOSIS — R918 Other nonspecific abnormal finding of lung field: Secondary | ICD-10-CM

## 2018-11-06 DIAGNOSIS — I361 Nonrheumatic tricuspid (valve) insufficiency: Secondary | ICD-10-CM

## 2018-11-06 LAB — BASIC METABOLIC PANEL
Anion gap: 10 (ref 5–15)
BUN: 10 mg/dL (ref 8–23)
CO2: 24 mmol/L (ref 22–32)
Calcium: 8.8 mg/dL — ABNORMAL LOW (ref 8.9–10.3)
Chloride: 96 mmol/L — ABNORMAL LOW (ref 98–111)
Creatinine, Ser: 0.88 mg/dL (ref 0.61–1.24)
GFR calc Af Amer: >60 mL/min (ref >60)
GFR calc non Af Amer: >60 mL/min (ref >60)
Glucose, Bld: 102 mg/dL — ABNORMAL HIGH (ref 70–99)
Potassium: 3.5 mmol/L (ref 3.5–5.1)
Sodium: 130 mmol/L — ABNORMAL LOW (ref 135–145)

## 2018-11-06 LAB — ECHOCARDIOGRAM COMPLETE
Height: 73 in
Weight: 2782.4 oz

## 2018-11-06 LAB — HEPARIN LEVEL (UNFRACTIONATED): Heparin Unfractionated: 0.34 IU/mL (ref 0.30–0.70)

## 2018-11-06 LAB — MAGNESIUM: Magnesium: 1.4 mg/dL — ABNORMAL LOW (ref 1.7–2.4)

## 2018-11-06 MED ORDER — PERFLUTREN LIPID MICROSPHERE
1.0000 mL | INTRAVENOUS | Status: AC | PRN
  Filled 2018-11-06: qty 10

## 2018-11-06 MED ORDER — PERFLUTREN LIPID MICROSPHERE
INTRAVENOUS | Status: AC
  Filled 2018-11-06: qty 10

## 2018-11-06 MED ORDER — MAGNESIUM SULFATE 2 GM/50ML IV SOLN
2.0000 g | Freq: Once | INTRAVENOUS | Status: AC
  Administered 2018-11-06: 2 g via INTRAVENOUS
  Filled 2018-11-06: qty 50

## 2018-11-06 MED ORDER — DILTIAZEM HCL ER COATED BEADS 240 MG PO CP24
240.0000 mg | ORAL_CAPSULE | Freq: Every day | ORAL | Status: DC
  Administered 2018-11-06 – 2018-11-08 (×3): 240 mg via ORAL
  Filled 2018-11-06 (×3): qty 1

## 2018-11-06 NOTE — Progress Notes (Signed)
NAME:  Joe Cole, MRN:  [id number], DOB:  04/27/1951, LOS: 1 ADMISSION DATE:  11/05/2018, CHIEF COMPLAINT:  Tachycardia   Brief History   68 yo male former smoker with Rt lung mass and presented for bronchoscopy.  Found to have new onset A fib/flutter with RVR.  Bronchoscopy aborted and patient admitted to hospital.  History of present illness   68 yo male followed in pulmonary office for COPD with emphysema.  He had recent exacerbation and slow to improve.  CXR showed RML consolidation.  CT chest showed borderline LAN, emphysema, and mass in Rt perihilar region.  PET scan showed SUV 19.6 in Rt hilar region with obstruction of RML, and 7.6 SUV RLL.  He was scheduled for bronchoscopy.  On arrival he was connected to telemetry and found to have A fib/flutter with HR up to 160.  He was not aware of this.  No prior history.  Denies chest pain, dyspnea, nausea, dizziness, palpitations.  Past Medical History  HTN, Arthritis, COPD  Significant Hospital Events   7/09 Admit  Consults:  7/9/>> Cardiology  Procedures:    Significant Diagnostic Tests:  Echo 7/08 >> pending( completed this am)  CXT 11/05/2018>>IMPRESSION: 1. Right infrahilar mass with right base atelectasis/infiltrate again noted. Similar finding noted on prior exam.  2. Stable cardiomegaly with prominent central pulmonary arteries suggesting pulmonary hypertension.  10/19/2018-chest x-ray-consolidation atelectasis in right middle lobe with tiny right effusion this could represent pneumonia however possibility of an obstructing mass in the right middle lobe bronchus should be considered as well  10/20/2018-CTA chest- no PE, apparent mass arising from right hilum with apparent tumor surrounding the proximal right middle and lower lobe bronchi, there is airspace consolidation throughout the medial segment right middle lobe to a lesser extent patchy airspace consolidation of portions of the right lower lobe, there is felt to  be adenopathy in the inferior hilum on the right measuring 2.2 x 1.8 cm  10/19/2018-d-dimer-0.98   PFT 05/04/18 >> FEV1 2.35 (64%), FEV1% 59, TLC 9.00 (121%), RV 4.89 (197%), DLCO 60%  A1AT level 05/04/18 >> 167, MM  Micro Data:  10/27/2018- SARS-CoV-2-test>> negative 09/08/2018-SARS-CoV-2 test-negative   Antimicrobials:  None  Interim history/subjective:  States he feels good. No complaints except would like to get OOB as his back is aching Atrial fib rate controlled through the night. Rate has ranged from 70-100. Pt. Is asymptomatic Mag is being repleted by cards ( was 1.4 on 7/9), will re check this afternoon TSH normal Remains on RA with sats of 97% Remains on Cardizem gtt. If EF is normal per echo ( which is pending) cards plans to transition to po Cardizem. Remains on heparin gtt, plan to transition to Eliquis after bronch  Objective   Blood pressure 102/75, pulse 77, temperature 97.8 F (36.6 C), temperature source Oral, resp. rate 16, height 6\' 1"  (1.854 m), weight 78.9 kg, SpO2 97 %.        Intake/Output Summary (Last 24 hours) at 11/06/2018 1002 Last data filed at 11/06/2018 0102 Gross per 24 hour  Intake 1537.58 ml  Output -  Net 1537.58 ml   Filed Weights   11/05/18 1400 11/06/18 0428  Weight: 83.9 kg 78.9 kg    Examination:  General - alert Eyes - pupils reactive ENT - no sinus tenderness, no stridor Cardiac - irregular, tachycardic, no murmur Chest - decreased BS, no wheeze/rales Abdomen - soft, non tender, + bowel sounds Extremities - no cyanosis, clubbing, or edema Skin - no  rashes Neuro - normal strength, moves extremities, follows commands Lymphatics - no lymphadenopathy Psych - normal mood and behavior  ECG - A fib with HR 142   Resolved Hospital Problem list     Assessment & Plan:   New onset A fib with RVR Troponin HS>> 8 TSH normal Mag 1.4 Echo pending Plan Appreciate cards input- - continue heparin gtt per pharmacy until  after bronch, then transition to Eliquis - cardizem gtt for rate control>> convert to po dosing if EF normal per echo - EKG prn  Non-sustained V tach Patient has had a few short runs of non-sustained VT with the longest one lasting 16 beats. Plan Replete Mag per cards Trend Mag in am  Mag goal > 2.0 and K goal > 4.0 but < 5 Trend BMET  HTN - BP currently well controlled at present  Plan - Home Amlodipine held at this time due to need for IV Cardizem. - Home Losartan-HCTZ  held on admission.   Rt lung mass. Plan - will need to defer bronchoscopy until cardiac w/u completed - Most likely early next week - CXR prn  COPD with emphysema. Sats stable on RA No bronchospasm Plan - Continue prn atrovent, xopenex  Hypomag 1.4 on 7/9 Hyper K 5/1 on 7/9 Plan Repletion of mag  today  Check BMET and Mag in am  Best practice:  Diet: heart healthy diet DVT prophylaxis: heparin gtt GI prophylaxis: not indicated Mobility: bed rest  Code Status: full code Family Communication: updated pt's wife by phone Disposition: admit to cardiac telemetry  Labs   CBC: Recent Labs  Lab 11/05/18 1629  WBC 7.5  NEUTROABS 5.4  HGB 13.4  HCT 37.6*  MCV 103.9*  PLT 182    Basic Metabolic Panel: Recent Labs  Lab 11/05/18 1629  NA 132*  K 5.1  CL 98  CO2 22  GLUCOSE 134*  BUN 9  CREATININE 0.93  CALCIUM 9.0  MG 1.4*   GFR: Estimated Creatinine Clearance: 87.2 mL/min (by C-G formula based on SCr of 0.93 mg/dL). Recent Labs  Lab 11/05/18 1629  WBC 7.5    Liver Function Tests: Recent Labs  Lab 11/05/18 1629  AST 27  ALT 14  ALKPHOS 61  BILITOT 1.6*  PROT 6.1*  ALBUMIN 3.2*   No results for input(s): LIPASE, AMYLASE in the last 168 hours. No results for input(s): AMMONIA in the last 168 hours.  ABG No results found for: PHART, PCO2ART, PO2ART, HCO3, TCO2, ACIDBASEDEF, O2SAT   Coagulation Profile: No results for input(s): INR, PROTIME in the last 168 hours.   Cardiac Enzymes: No results for input(s): CKTOTAL, CKMB, CKMBINDEX, TROPONINI in the last 168 hours.  HbA1C: No results found for: HGBA1C  CBG: No results for input(s): GLUCAP in the last 168 hours.   Allergies No Known Allergies   Home Medications  Prior to Admission medications   Medication Sig Start Date End Date Taking? Authorizing Provider  albuterol (PROAIR HFA) 108 (90 Base) MCG/ACT inhaler Inhale 2 puffs into the lungs every 6 (six) hours as needed for wheezing or shortness of breath. 03/18/18  Yes [name], MD  amLODipine (NORVASC) 10 MG tablet Take 10 mg by mouth daily.   Yes [provider]  atorvastatin (LIPITOR) 20 MG tablet Take 20 mg by mouth daily.   Yes [provider]  diphenhydramine-acetaminophen (TYLENOL PM) 25-500 MG TABS tablet Take 2 tablets by mouth at bedtime as needed (sleep).    Yes [provider]  ibuprofen (ADVIL) 200 MG tablet Take 400 mg by mouth every 6 (six) hours as needed for headache or moderate pain.   Yes [provider]  levofloxacin (LEVAQUIN) 500 MG tablet Take 1 tablet (500 mg total) by mouth daily. 10/26/18  Yes Lauraine Rinne, NP  losartan-hydrochlorothiazide (HYZAAR) 100-25 MG tablet Take 1 tablet by mouth daily.   Yes [provider]  Probiotic Product (ALIGN) 4 MG CAPS Take 4 mg by mouth daily.   Yes [provider]  umeclidinium-vilanterol (ANORO ELLIPTA) 62.5-25 MCG/INH AEPB Inhale 1 puff into the lungs daily.   Yes [provider]  amLODipine (NORVASC) 2.5 MG tablet Take 1 tablet (2.5 mg total) by mouth daily. Patient not taking: Reported on 11/02/2018 06/18/18   Park Liter, MD  amoxicillin-clavulanate (AUGMENTIN) 875-125 MG tablet Take 1 tablet by mouth 2 (two) times daily. Patient not taking: Reported on 11/02/2018 10/19/18   Lauraine Rinne, NP  benzonatate (TESSALON) 200 MG capsule Take 1 capsule (200 mg total) by mouth 3 (three) times daily as needed for cough.  Patient not taking: Reported on 11/02/2018 10/26/18   Lauraine Rinne, NP  buPROPion (ZYBAN) 150 MG 12 hr tablet TAKE 1 TABLET BY MOUTH TWICE A DAY Patient not taking: Reported on 11/02/2018 07/06/18   [name], MD  doxycycline (VIBRA-TABS) 100 MG tablet Take 1 tablet (100 mg total) by mouth 2 (two) times daily. Patient not taking: Reported on 11/02/2018 10/06/18   Lauraine Rinne, NP  fluticasone [facility]) 50 MCG/ACT nasal spray Place 1 spray into both nostrils daily. Patient not taking: Reported on 11/02/2018 03/18/18   [name], MD  predniSONE (DELTASONE) 10 MG tablet Take 2 tablets (20mg  total) daily for the next 5 days. Take in the AM with food. Patient not taking: Reported on 11/02/2018 10/06/18   Lauraine Rinne, NP    Magdalen Spatz, AGACNP-BC [place] Pager # [phone] After 4 pm call [phone] 11/06/2018, 10:02 AM

## 2018-11-06 NOTE — Progress Notes (Signed)
ANTICOAGULATION CONSULT NOTE - Follow Up Consult  Pharmacy Consult for Heparin Indication: atrial fibrillation  No Known Allergies  Patient Measurements: Height: 6\' 1"  (185.4 cm) Weight: 173 lb 14.4 oz (78.9 kg) IBW/kg (Calculated) : 79.9 Heparin Dosing Weight: 83.9  Vital Signs: Temp: 97.8 F (36.6 C) (07/10 0823) Temp Source: Oral (07/10 0823) BP: 102/75 (07/10 0823) Pulse Rate: 77 (07/10 0823)  Labs: Recent Labs    11/05/18 1629 11/05/18 2206 11/06/18 0423  HGB 13.4  --   --   HCT 37.6*  --   --   PLT 241  --   --   HEPARINUNFRC  --  0.37 0.34  CREATININE 0.93  --   --   TROPONINIHS 8  --   --     Estimated Creatinine Clearance: 87.2 mL/min (by C-G formula based on SCr of 0.93 mg/dL).   Medications:  Infusions:  . sodium chloride    . diltiazem (CARDIZEM) infusion 10 mg/hr (11/06/18 0700)  . heparin 1,200 Units/hr (11/06/18 0829)    Assessment: HL is therapeutic for A-fib at 0.34.  Patient's Hg/HCT are 13.4/37.6, both are stable and WNLs. Platelets are WNLs  Goal of Therapy:  Heparin level 0.3-0.7 units/ml Monitor platelets by anticoagulation protocol: Yes   Plan:  Continue same rate of heparin at 1,200 units/hr Continue to monitor H&H and platelets daily with AM labs. Monitor HL daily with AM labs. Watch for signs/symptoms of bleeding  Sherren Kerns, PharmD PGY1 Early Resident [phone] 11/06/2018,9:07 AM

## 2018-11-06 NOTE — Progress Notes (Addendum)
Progress Note  Patient Name: Joe Cole Date of Encounter: 11/06/2018  Primary Cardiologist: Candee Furbish, MD (New Patient)  Subjective   No significant overnight events. Patient completely asymptomatic with his atrial fibrillation. No chest pain, palpitations, lightheadedness, or dizziness. Patient has some shortness of breath at baseline but denies any worsening of this.  Patient had 16 beats of non-sustained VT overnight and per RN note was sleeping during this episode.  Inpatient Medications    Scheduled Meds: . atorvastatin  20 mg Oral Daily   Continuous Infusions: . sodium chloride    . diltiazem (CARDIZEM) infusion 10 mg/hr (11/06/18 0700)  . heparin 1,200 Units/hr (11/06/18 0829)   PRN Meds: sodium chloride, acetaminophen, ipratropium, levalbuterol   Vital Signs    Vitals:   11/05/18 1954 11/05/18 2254 11/06/18 0428 11/06/18 0823  BP:  123/77 125/82 102/75  Pulse:  83 91 77  Resp:  (!) 21 13 16   Temp: 98 F (36.7 C) 98.3 F (36.8 C) 98 F (36.7 C) 97.8 F (36.6 C)  TempSrc: Oral Oral Oral Oral  SpO2:  96% 98% 97%  Weight:   78.9 kg   Height:        Intake/Output Summary (Last 24 hours) at 11/06/2018 0938 Last data filed at 11/06/2018 0829 Gross per 24 hour  Intake 1537.58 ml  Output -  Net 1537.58 ml   Last 3 Weights 11/06/2018 11/05/2018 07/06/2018  Weight (lbs) 173 lb 14.4 oz 185 lb 180 lb  Weight (kg) 78.881 kg 83.915 kg 81.647 kg      Telemetry    Atrial fibrillation with ventricular rates mostly well controlled in the 70's to low 100's. A few brief episodes of RVR (one episode as high as 150's to 160's yesterday afternoon). Also has had a few short runs of non-sustained VT, longest one 16 bests. - Personally Reviewed  ECG    No new ECG tracing today. - Personally Reviewed  Physical Exam   GEN: Caucasian male resting comfortably in no acute distress.   Neck: Supple. Cardiac: Irregularly irregular rhythm with normal rate. No murmurs,  rubs, or gallops.  Respiratory: No increased work of breathing. Lungs clear to auscultation bilaterally. No wheezes, rhonchi, or rales. GI: Abdomen soft, non-distended, and non-tender. Bowel sounds present. MS: No lower extremity edema. No deformities. Neuro:  No focal deficits.  Psych: Normal affect. Responds appropriately.  Labs    High Sensitivity Troponin:   Recent Labs  Lab 11/05/18 1629  TROPONINIHS 8      Cardiac EnzymesNo results for input(s): TROPONINI in the last 168 hours. No results for input(s): TROPIPOC in the last 168 hours.   Chemistry Recent Labs  Lab 11/05/18 1629  NA 132*  K 5.1  CL 98  CO2 22  GLUCOSE 134*  BUN 9  CREATININE 0.93  CALCIUM 9.0  PROT 6.1*  ALBUMIN 3.2*  AST 27  ALT 14  ALKPHOS 61  BILITOT 1.6*  GFRNONAA >60  GFRAA >60  ANIONGAP 12     Hematology Recent Labs  Lab 11/05/18 1629  WBC 7.5  RBC 3.62*  HGB 13.4  HCT 37.6*  MCV 103.9*  MCH 37.0*  MCHC 35.6  RDW 14.1  PLT 241    BNPNo results for input(s): BNP, PROBNP in the last 168 hours.   DDimer No results for input(s): DDIMER in the last 168 hours.   Radiology    Dg Chest Port 1 View  Result Date: 11/05/2018 CLINICAL DATA:  COPD.  Emphysema. EXAM:  PORTABLE CHEST 1 VIEW COMPARISON:  CT 10/29/2018. Chest x-ray 10/19/2018. Chest x-ray 02/25/2018. FINDINGS: Mediastinum and hilar structures normal. Right infrahilar mass with atelectasis/infiltrate again noted. Stable cardiomegaly with prominent central pulmonary arteries suggesting pulmonary hypertension. IMPRESSION: 1. Right infrahilar mass with right base atelectasis/infiltrate again noted. Similar finding noted on prior exam. 2. Stable cardiomegaly with prominent central pulmonary arteries suggesting pulmonary hypertension. Electronically Signed   By: Marcello Moores  Register   On: 11/05/2018 16:36    Cardiac Studies   Echo pending.  Patient Profile   Joe Cole is a 68 y.o. male with a history of hypertension,  hyperlipidemia, former tobacco use, and COPD with emphysema and newly diagnosed right lung mass who presented today for bronchoscopy and found to be in atrial fibrillation with RVR. Cardiology consulted for management of atrial fibrillation, at the request of Dr. Halford Chessman.  Assessment & Plan   New Onset Atrial Fibrillation with RVR - Patient asymptomatic with this so unclear how long he has been in atrial fibrillation. - Telemetry shows atrial fibrillation with ventricular rates mostly well controlled in the 70's to low 100's. - Echo pending. - Potassium 5.1 yesterday. Today's BMET pending. - Magnesium 1.4 yesterday. It does not look like this has been repleted. Will order 2g of magneisum sulfate and recheck Magnesium later today. - TSH normal. - Currently on Cardizem drip. - Currently IV Heparin at this time. Transition to Eliquis when able following bronchoscopy.  - Patient states bronchoscopy may be rescheduled for next week. If that is the case, can likely start to transition to PO medications. If EF is normal, can transition to PO Cardizem.   Non-Sustained VT - Patient has had a few short runs of non-sustained VT with the longest one lasting 16 beats. - Electrolytes as above. Repleting magnesium.  COPD with Emphysema/ Right-Sided Lung Mass - Plan was for bronchoscopy today but procedure was aborted/postponed due to atrial fibrillation with RVR. - Management per Pulmonary.  Hypertension - BP currently well controlled at 125/82. - Home Amlodipine held at this time due to need for IV Cardizem. - Home Losartan-HCTZ also held on admission.   Hyperlipidemia - Continue home Lipitor.  History of Tobacco Use - Patient has a 35 year smoking history but quit about 2.5 years ago.  For questions or updates, please contact [place] Please consult [email or web address] for contact info under        Signed, Darreld Mclean, PA-C  11/06/2018, 9:38 AM    Personally seen and examined. Agree  with above.   Atrial fibrillation is under better control heart rates mostly less than 100. I think that it does make sense to hold off on long-term anticoagulation until bronchoscopy is performed to minimize bleeding risks.  Recommend starting Eliquis following bronchoscopy.  -Change to p.o. diltiazem 240 mg once a day.  Stopping IV.  Echo pending read.  Candee Furbish, MD

## 2018-11-06 NOTE — Progress Notes (Signed)
Pt, os scheduled for Bronch with Dr. Elsworth Soho 11/09/2018 ( Monday am ) at 8 am. NPO after midnight Sunday 7/12 Patient is aware and verbalized understanding  Magdalen Spatz, AGACNP-BC [place] Pager # [phone] After 4 pm call [phone] 11/06/2018 3:00 PM

## 2018-11-06 NOTE — Progress Notes (Signed)
Patient had a 16 beat run of Ventricular Tachycardia.  Patient appears to be sleeping (resting in bed with eyes closed).  Will continue to monitor.

## 2018-11-06 NOTE — Progress Notes (Signed)
Echocardiogram 2D Echocardiogram has been performed.  Oneal Deputy Jevon [place] 11/06/2018, 10:34 AM

## 2018-11-07 LAB — BASIC METABOLIC PANEL
Anion gap: 11 (ref 5–15)
BUN: 9 mg/dL (ref 8–23)
CO2: 23 mmol/L (ref 22–32)
Calcium: 8.8 mg/dL — ABNORMAL LOW (ref 8.9–10.3)
Chloride: 98 mmol/L (ref 98–111)
Creatinine, Ser: 0.9 mg/dL (ref 0.61–1.24)
GFR calc Af Amer: >60 mL/min (ref >60)
GFR calc non Af Amer: >60 mL/min (ref >60)
Glucose, Bld: 103 mg/dL — ABNORMAL HIGH (ref 70–99)
Potassium: 3.6 mmol/L (ref 3.5–5.1)
Sodium: 132 mmol/L — ABNORMAL LOW (ref 135–145)

## 2018-11-07 LAB — CBC
HCT: 36.5 % — ABNORMAL LOW (ref 39.0–52.0)
Hemoglobin: 12.9 g/dL — ABNORMAL LOW (ref 13.0–17.0)
MCH: 37.3 pg — ABNORMAL HIGH (ref 26.0–34.0)
MCHC: 35.3 g/dL (ref 30.0–36.0)
MCV: 105.5 fL — ABNORMAL HIGH (ref 80.0–100.0)
Platelets: 252 10*3/uL (ref 150–400)
RBC: 3.46 MIL/uL — ABNORMAL LOW (ref 4.22–5.81)
RDW: 14 % (ref 11.5–15.5)
WBC: 5.9 10*3/uL (ref 4.0–10.5)
nRBC: 0 % (ref 0.0–0.2)

## 2018-11-07 LAB — PHOSPHORUS: Phosphorus: 3.9 mg/dL (ref 2.5–4.6)

## 2018-11-07 LAB — MAGNESIUM: Magnesium: 1.6 mg/dL — ABNORMAL LOW (ref 1.7–2.4)

## 2018-11-07 LAB — HEPARIN LEVEL (UNFRACTIONATED): Heparin Unfractionated: 0.5 IU/mL (ref 0.30–0.70)

## 2018-11-07 NOTE — Progress Notes (Signed)
Progress Note  Patient Name: Joe Cole Date of Encounter: 11/07/2018  Primary Cardiologist: Candee Furbish, MD   Subjective   Comfortable in bed, no significant shortness of breath or chest pain.  Atrial fibrillation under better control.  Inpatient Medications    Scheduled Meds: . atorvastatin  20 mg Oral Daily  . diltiazem  240 mg Oral Daily   Continuous Infusions: . sodium chloride    . heparin 1,200 Units/hr (11/07/18 0200)   PRN Meds: sodium chloride, acetaminophen, ipratropium, levalbuterol   Vital Signs    Vitals:   11/06/18 1940 11/07/18 0000 11/07/18 0340 11/07/18 0821  BP: 132/89 111/71 120/86 (!) 121/94  Pulse: 79 68 96 92  Resp: 18 17 20    Temp: 98.5 F (36.9 C) 98.3 F (36.8 C) 97.9 F (36.6 C) 97.6 F (36.4 C)  TempSrc: Oral Oral Oral Oral  SpO2: 97% 97% 95% 94%  Weight:   78.3 kg   Height:        Intake/Output Summary (Last 24 hours) at 11/07/2018 0839 Last data filed at 11/07/2018 0353 Gross per 24 hour  Intake 515.51 ml  Output 1275 ml  Net -759.49 ml   Last 3 Weights 11/07/2018 11/06/2018 11/05/2018  Weight (lbs) 172 lb 9.9 oz 173 lb 14.4 oz 185 lb  Weight (kg) 78.3 kg 78.881 kg 83.915 kg      Telemetry    Atrial fibrillation heart rate in the 80s mostly yesterday, this morning however 140.- Personally Reviewed  ECG    A. fib- Personally Reviewed  Physical Exam   GEN: No acute distress.   Neck: No JVD Cardiac:  Irregularly irregular, no murmurs, rubs, or gallops.  Respiratory: Clear to auscultation bilaterally. GI: Soft, nontender, non-distended  MS: No edema; No deformity. Neuro:  Nonfocal  Psych: Normal affect   Labs    High Sensitivity Troponin:   Recent Labs  Lab 11/05/18 1629  TROPONINIHS 8      Cardiac EnzymesNo results for input(s): TROPONINI in the last 168 hours. No results for input(s): TROPIPOC in the last 168 hours.   Chemistry Recent Labs  Lab 11/05/18 1629 11/06/18 1047 11/07/18 0404  NA 132*  130* 132*  K 5.1 3.5 3.6  CL 98 96* 98  CO2 22 24 23   GLUCOSE 134* 102* 103*  BUN 9 10 9   CREATININE 0.93 0.88 0.90  CALCIUM 9.0 8.8* 8.8*  PROT 6.1*  --   --   ALBUMIN 3.2*  --   --   AST 27  --   --   ALT 14  --   --   ALKPHOS 61  --   --   BILITOT 1.6*  --   --   GFRNONAA >60 >60 >60  GFRAA >60 >60 >60  ANIONGAP 12 10 11      Hematology Recent Labs  Lab 11/05/18 1629 11/07/18 0404  WBC 7.5 5.9  RBC 3.62* 3.46*  HGB 13.4 12.9*  HCT 37.6* 36.5*  MCV 103.9* 105.5*  MCH 37.0* 37.3*  MCHC 35.6 35.3  RDW 14.1 14.0  PLT 241 252    BNPNo results for input(s): BNP, PROBNP in the last 168 hours.   DDimer No results for input(s): DDIMER in the last 168 hours.   Radiology    Dg Chest Port 1 View  Result Date: 11/05/2018 CLINICAL DATA:  COPD.  Emphysema. EXAM: PORTABLE CHEST 1 VIEW COMPARISON:  CT 10/29/2018. Chest x-ray 10/19/2018. Chest x-ray 02/25/2018. FINDINGS: Mediastinum and hilar structures normal. Right infrahilar  mass with atelectasis/infiltrate again noted. Stable cardiomegaly with prominent central pulmonary arteries suggesting pulmonary hypertension. IMPRESSION: 1. Right infrahilar mass with right base atelectasis/infiltrate again noted. Similar finding noted on prior exam. 2. Stable cardiomegaly with prominent central pulmonary arteries suggesting pulmonary hypertension. Electronically Signed   By: Marcello Moores  Register   On: 11/05/2018 16:36    Cardiac Studies   Echo EF low normal-dilated RV compatible with underlying lung disease  Patient Profile     68 y.o. male awaiting bronchoscopy, atrial fibrillation with rapid ventricular response improved.  Assessment & Plan    Lung mass -Bronchoscopy Monday.  N.p.o. Sunday.  Atrial fibrillation persistent - Transitioned over to diltiazem 240 once a day yesterday.  Was well rate controlled all day yesterday, this morning is elevated in the 140 range-he was changing his clothes he states.  We will go ahead and  administer the dose of diltiazem.  If heart rates are increased throughout the day, give an additional 120 mg of long-acting diltiazem and we will increase the dose to 360 long-acting tomorrow morning.  Chronic anticoagulation -Currently on IV heparin for procedure.  After procedure, start Eliquis 5 mg twice a day when felt safe to do so by pulmonary.     For questions or updates, please contact [place] Please consult [email or web address] for contact info under        Signed, Candee Furbish, MD  11/07/2018, 8:39 AM

## 2018-11-07 NOTE — Progress Notes (Signed)
ANTICOAGULATION CONSULT NOTE - Initial Consult  Pharmacy Consult for heparin Indication: atrial fibrillation  No Known Allergies  Patient Measurements: Height: 6\' 1"  (185.4 cm) Weight: 172 lb 9.9 oz (78.3 kg) IBW/kg (Calculated) : 79.9 Heparin Dosing Weight: 83.9 kg  Vital Signs: Temp: 97.9 F (36.6 C) (07/11 0340) Temp Source: Oral (07/11 0340) BP: 120/86 (07/11 0340) Pulse Rate: 96 (07/11 0340)  Labs: Recent Labs    11/05/18 1629 11/05/18 2206 11/06/18 0423 11/06/18 1047 11/07/18 0404  HGB 13.4  --   --   --  12.9*  HCT 37.6*  --   --   --  36.5*  PLT 241  --   --   --  252  HEPARINUNFRC  --  0.37 0.34  --  0.50  CREATININE 0.93  --   --  0.88 0.90  TROPONINIHS 8  --   --   --   --     Estimated Creatinine Clearance: 89.4 mL/min (by C-G formula based on SCr of 0.9 mg/dL).   Medical History: Past Medical History:  Diagnosis Date  . Arthritis   . Atrial fibrillation ([place]) 10/2018  . COPD with emphysema ([place])   . Dyspnea   . Hypertension     Medications:  Scheduled:  . atorvastatin  20 mg Oral Daily  . diltiazem  240 mg Oral Daily    Assessment: 54 yom with hx of Rt lung mass presenting for bronchoscopy and found to be in Afib with RVR. No PTA AC.  Patient's heparin level therapeutic this AM of 0.50. Hgb decreased from 13.4 to 12.9, pltc stable. No bleeding noted.   Goal of Therapy:  Heparin level 0.3-0.7 units/ml Monitor platelets by anticoagulation protocol: Yes   Plan:  Continue heparin infusion at 1200 units/hr Check daily heparin level and CBC Monitor for s/sx of bleeding F/u plans to switch to PO anticoagulation  Thank you for allowing pharmacy to be a part of this patient's care.  Leron Croak, PharmD PGY2 Oncology/Hematology Pharmacy Resident 11/07/2018 7:41 AM

## 2018-11-08 LAB — CBC
HCT: 34.1 % — ABNORMAL LOW (ref 39.0–52.0)
Hemoglobin: 11.8 g/dL — ABNORMAL LOW (ref 13.0–17.0)
MCH: 36.2 pg — ABNORMAL HIGH (ref 26.0–34.0)
MCHC: 34.6 g/dL (ref 30.0–36.0)
MCV: 104.6 fL — ABNORMAL HIGH (ref 80.0–100.0)
Platelets: 233 10*3/uL (ref 150–400)
RBC: 3.26 MIL/uL — ABNORMAL LOW (ref 4.22–5.81)
RDW: 13.9 % (ref 11.5–15.5)
WBC: 4.9 10*3/uL (ref 4.0–10.5)
nRBC: 0 % (ref 0.0–0.2)

## 2018-11-08 LAB — HEPARIN LEVEL (UNFRACTIONATED)
Heparin Unfractionated: 0.28 IU/mL — ABNORMAL LOW (ref 0.30–0.70)
Heparin Unfractionated: 0.49 IU/mL (ref 0.30–0.70)
Heparin Unfractionated: 0.38 IU/mL (ref 0.30–0.70)

## 2018-11-08 MED ORDER — DILTIAZEM HCL ER COATED BEADS 180 MG PO CP24
360.0000 mg | ORAL_CAPSULE | Freq: Every day | ORAL | Status: DC
  Administered 2018-11-09: 360 mg via ORAL
  Filled 2018-11-08: qty 2

## 2018-11-08 MED ORDER — DILTIAZEM HCL ER COATED BEADS 120 MG PO CP24
120.0000 mg | ORAL_CAPSULE | Freq: Once | ORAL | Status: AC
  Administered 2018-11-08: 120 mg via ORAL
  Filled 2018-11-08: qty 1

## 2018-11-08 MED ORDER — HEPARIN BOLUS VIA INFUSION
1000.0000 [IU] | Freq: Once | INTRAVENOUS | Status: AC
  Administered 2018-11-08: 1000 [IU] via INTRAVENOUS
  Filled 2018-11-08: qty 1000

## 2018-11-08 NOTE — Progress Notes (Signed)
ANTICOAGULATION CONSULT NOTE - Follow Up Consult  Pharmacy Consult for heparin Indication: atrial fibrillation  Labs: Recent Labs    11/06/18 1047 11/07/18 0404 11/08/18 0411 11/08/18 1343 11/08/18 2302  HGB  --  12.9* 11.8*  --   --   HCT  --  36.5* 34.1*  --   --   PLT  --  252 233  --   --   HEPARINUNFRC  --  0.50 0.28* 0.49 0.38  CREATININE 0.88 0.90  --   --   --     Assessment/Plan:  68yo male remains therapeutic on heparin though has trended down to lower end of goal. Hep gtt to stop at MN for procedure; will f/u am in to ?resume.   Wynona Neat, PharmD, BCPS  11/08/2018,11:52 PM

## 2018-11-08 NOTE — Progress Notes (Signed)
Patient had an order for holding heparin at midnight and at 0400 in am.  Clarified with Dr. Genevive Bi to stop heparin at 0400 11/09/2018.  Proceeded to consent pt for bronchoscopy in am.  Pt stated he signed consent on Thursday and declined to sign another until in procedure area.

## 2018-11-08 NOTE — Progress Notes (Signed)
Dr. Genevive Bi called to notify nursing she consulted Dr. Elsworth Soho (MD performing bronch) and we are to discontinue heparin at MN.  Dr. Genevive Bi updated that patient declined to sign consent this pm as well.

## 2018-11-08 NOTE — Progress Notes (Addendum)
ANTICOAGULATION CONSULT NOTE - Initial Consult  Pharmacy Consult for heparin Indication: atrial fibrillation  No Known Allergies  Patient Measurements: Height: 6\' 1"  (185.4 cm) Weight: 172 lb 9.9 oz (78.3 kg) IBW/kg (Calculated) : 79.9 Heparin Dosing Weight: 78.3 kg  Vital Signs: Temp: 97.8 F (36.6 C) (07/12 0624) Temp Source: Oral (07/12 0624) BP: 114/71 (07/12 0624) Pulse Rate: 80 (07/12 0624)  Labs: Recent Labs    11/05/18 1629  11/06/18 0423 11/06/18 1047 11/07/18 0404 11/08/18 0411  HGB 13.4  --   --   --  12.9* 11.8*  HCT 37.6*  --   --   --  36.5* 34.1*  PLT 241  --   --   --  252 233  HEPARINUNFRC  --    < > 0.34  --  0.50 0.28*  CREATININE 0.93  --   --  0.88 0.90  --   TROPONINIHS 8  --   --   --   --   --    < > = values in this interval not displayed.    Estimated Creatinine Clearance: 89.4 mL/min (by C-G formula based on SCr of 0.9 mg/dL).   Medical History: Past Medical History:  Diagnosis Date  . Arthritis   . Atrial fibrillation ([place]) 10/2018  . COPD with emphysema ([place])   . Dyspnea   . Hypertension     Medications:  Scheduled:  . atorvastatin  20 mg Oral Daily  . diltiazem  240 mg Oral Daily    Assessment: 67 yom with hx of Rt lung mass presenting for bronchoscopy and found to be in Afib with RVR. No PTA AC.  Patient's heparin level subtherapeutic this AM at 0.28 (goal 0.3-0.7). Hgb decreased from 12.9 to 11.8, pltc stable. No issues with heparin infusion or s/sx of bleeding per RN this AM.  Goal of Therapy:  Heparin level 0.3-0.7 units/ml Monitor platelets by anticoagulation protocol: Yes   Plan:  Bolus heparin 1000 units x 1 Increase heparin infusion to 1350 units/hr Recheck heparin level in 6 hours Check daily heparin level and CBC Monitor for s/sx of bleeding F/u plans to switch to PO anticoagulation  ADDENDUM 11/08/18 at 15:56 8 hour heparin level therapeutic this afternoon at 0.49. No issues with infusion noted. Will  continue patient on current heparin infusion rate at 1350 units/hr and recheck confirmatory heparin level.   Thank you for allowing pharmacy to be a part of this patient's care.  Leron Croak, PharmD PGY2 Oncology/Hematology Pharmacy Resident 11/08/2018 2:57 PM

## 2018-11-08 NOTE — Progress Notes (Signed)
Progress Note  Patient Name: Joe Cole Date of Encounter: 11/08/2018  Primary Cardiologist: Candee Furbish, MD   Subjective   Sitting in bed comfortable no chest pain no shortness of breath  Inpatient Medications    Scheduled Meds: . atorvastatin  20 mg Oral Daily  . diltiazem  120 mg Oral Once  . [START ON 11/09/2018] diltiazem  360 mg Oral Daily   Continuous Infusions: . sodium chloride    . heparin 1,350 Units/hr (11/08/18 0832)   PRN Meds: sodium chloride, acetaminophen, ipratropium, levalbuterol   Vital Signs    Vitals:   11/07/18 0821 11/07/18 1350 11/07/18 1931 11/08/18 0624  BP: (!) 121/94 118/73 137/72 114/71  Pulse: 92 94 88 80  Resp:   18 18  Temp: 97.6 F (36.4 C) 98.1 F (36.7 C) 98.1 F (36.7 C) 97.8 F (36.6 C)  TempSrc: Oral Oral Oral Oral  SpO2: 94% 98% 100% 97%  Weight:      Height:        Intake/Output Summary (Last 24 hours) at 11/08/2018 0953 Last data filed at 11/08/2018 0839 Gross per 24 hour  Intake 1439.02 ml  Output 1875 ml  Net -435.98 ml   Last 3 Weights 11/07/2018 11/06/2018 11/05/2018  Weight (lbs) 172 lb 9.9 oz 173 lb 14.4 oz 185 lb  Weight (kg) 78.3 kg 78.881 kg 83.915 kg      Telemetry    Atrial fibrillation heart rates occasionally above 100- Personally Reviewed  ECG    A. fib with RVR- Personally Reviewed  Physical Exam   GEN: No acute distress.   Neck: No JVD Cardiac:  Irregularly irregular, no murmurs, rubs, or gallops.  Respiratory: Clear to auscultation bilaterally. GI: Soft, nontender, non-distended  MS: No edema; No deformity. Neuro:  Nonfocal  Psych: Normal affect   Labs    High Sensitivity Troponin:   Recent Labs  Lab 11/05/18 1629  TROPONINIHS 8      Cardiac EnzymesNo results for input(s): TROPONINI in the last 168 hours. No results for input(s): TROPIPOC in the last 168 hours.   Chemistry Recent Labs  Lab 11/05/18 1629 11/06/18 1047 11/07/18 0404  NA 132* 130* 132*  K 5.1 3.5 3.6   CL 98 96* 98  CO2 22 24 23   GLUCOSE 134* 102* 103*  BUN 9 10 9   CREATININE 0.93 0.88 0.90  CALCIUM 9.0 8.8* 8.8*  PROT 6.1*  --   --   ALBUMIN 3.2*  --   --   AST 27  --   --   ALT 14  --   --   ALKPHOS 61  --   --   BILITOT 1.6*  --   --   GFRNONAA >60 >60 >60  GFRAA >60 >60 >60  ANIONGAP 12 10 11      Hematology Recent Labs  Lab 11/05/18 1629 11/07/18 0404 11/08/18 0411  WBC 7.5 5.9 4.9  RBC 3.62* 3.46* 3.26*  HGB 13.4 12.9* 11.8*  HCT 37.6* 36.5* 34.1*  MCV 103.9* 105.5* 104.6*  MCH 37.0* 37.3* 36.2*  MCHC 35.6 35.3 34.6  RDW 14.1 14.0 13.9  PLT 241 252 233    BNPNo results for input(s): BNP, PROBNP in the last 168 hours.   DDimer No results for input(s): DDIMER in the last 168 hours.   Radiology    No results found.  Cardiac Studies   ECHO 55%  Patient Profile     68 y.o. male awaiting bronchoscopy for lung mass with atrial  fibrillation newly discovered  Assessment & Plan    Atrial fibrillation persistent -Continue with IV heparin for anticoagulation - Once able, start Eliquis post bronchoscopy which is going to take place tomorrow. -I will increase his diltiazem from 240 mg once a day to 360 mg once a day for further improved rate control.        For questions or updates, please contact [place] Please consult [email or web address] for contact info under        Signed, Candee Furbish, MD  11/08/2018, 9:53 AM

## 2018-11-08 NOTE — Progress Notes (Signed)
Pt afib controlled ventricular rate most of the night with occasional bursts 130s with sitting or standing up to urinate.  Pt also had 4 bts VT 0213.  Pt asymptomatic with NSVT and RVR.  Currently Afib 86. Will continue to monitor patient closely.

## 2018-11-09 ENCOUNTER — Inpatient Hospital Stay (HOSPITAL_COMMUNITY): Payer: Medicare HMO

## 2018-11-09 ENCOUNTER — Ambulatory Visit
Admission: RE | Admit: 2018-11-09 | Discharge: 2018-11-09 | Disposition: A | Discharge status: Home / Self Care | Payer: Medicare HMO | Source: Ambulatory Visit | Attending: Radiation Oncology | Admitting: Radiation Oncology

## 2018-11-09 ENCOUNTER — Encounter (HOSPITAL_COMMUNITY): Payer: Self-pay | Admitting: Respiratory Therapy

## 2018-11-09 ENCOUNTER — Encounter (HOSPITAL_COMMUNITY)
Admission: RE | Disposition: A | Discharge status: Home / Self Care | Payer: Self-pay | Source: Home / Self Care | Attending: Pulmonary Disease

## 2018-11-09 DIAGNOSIS — Z87891 Personal history of nicotine dependence: Secondary | ICD-10-CM | POA: Insufficient documentation

## 2018-11-09 DIAGNOSIS — Z510 Encounter for antineoplastic radiation therapy: Secondary | ICD-10-CM | POA: Insufficient documentation

## 2018-11-09 DIAGNOSIS — C3431 Malignant neoplasm of lower lobe, right bronchus or lung: Secondary | ICD-10-CM | POA: Insufficient documentation

## 2018-11-09 HISTORY — PX: VIDEO BRONCHOSCOPY: SHX5072

## 2018-11-09 LAB — BASIC METABOLIC PANEL
Anion gap: 8 (ref 5–15)
BUN: 9 mg/dL (ref 8–23)
CO2: 24 mmol/L (ref 22–32)
Calcium: 9.2 mg/dL (ref 8.9–10.3)
Chloride: 101 mmol/L (ref 98–111)
Creatinine, Ser: 0.81 mg/dL (ref 0.61–1.24)
GFR calc Af Amer: >60 mL/min (ref >60)
GFR calc non Af Amer: >60 mL/min (ref >60)
Glucose, Bld: 155 mg/dL — ABNORMAL HIGH (ref 70–99)
Potassium: 3.9 mmol/L (ref 3.5–5.1)
Sodium: 133 mmol/L — ABNORMAL LOW (ref 135–145)

## 2018-11-09 LAB — MAGNESIUM: Magnesium: 1.5 mg/dL — ABNORMAL LOW (ref 1.7–2.4)

## 2018-11-09 LAB — CBC
HCT: 36.8 % — ABNORMAL LOW (ref 39.0–52.0)
Hemoglobin: 12.7 g/dL — ABNORMAL LOW (ref 13.0–17.0)
MCH: 36.7 pg — ABNORMAL HIGH (ref 26.0–34.0)
MCHC: 34.5 g/dL (ref 30.0–36.0)
MCV: 106.4 fL — ABNORMAL HIGH (ref 80.0–100.0)
Platelets: 246 10*3/uL (ref 150–400)
RBC: 3.46 MIL/uL — ABNORMAL LOW (ref 4.22–5.81)
RDW: 14.1 % (ref 11.5–15.5)
WBC: 5.3 10*3/uL (ref 4.0–10.5)
nRBC: 0 % (ref 0.0–0.2)

## 2018-11-09 SURGERY — BRONCHOSCOPY, WITH FLUOROSCOPY
Anesthesia: Moderate Sedation | Laterality: Bilateral

## 2018-11-09 MED ORDER — DILTIAZEM HCL ER COATED BEADS 360 MG PO CP24: 360.0000 mg | ORAL_CAPSULE | Freq: Every day | ORAL | 3 refills | Status: DC

## 2018-11-09 MED ORDER — MAGNESIUM SULFATE 2 GM/50ML IV SOLN: 2.0000 g | Freq: Once | INTRAVENOUS | Status: DC

## 2018-11-09 MED ORDER — POTASSIUM CHLORIDE 20 MEQ/15ML (10%) PO SOLN: 20.0000 meq | Freq: Once | ORAL | Status: DC

## 2018-11-09 MED ORDER — ANORO ELLIPTA 62.5-25 MCG/INH IN AEPB: 1.0000 | INHALATION_SPRAY | Freq: Every day | RESPIRATORY_TRACT | 6 refills | Status: DC

## 2018-11-09 MED ORDER — BUTAMBEN-TETRACAINE-BENZOCAINE 2-2-14 % EX AERO: 1.0000 | INHALATION_SPRAY | Freq: Once | CUTANEOUS | Status: DC

## 2018-11-09 MED ORDER — FENTANYL CITRATE (PF) 100 MCG/2ML IJ SOLN
INTRAMUSCULAR | Status: AC
  Filled 2018-11-09: qty 4

## 2018-11-09 MED ORDER — SODIUM CHLORIDE 0.9 % IV SOLN: INTRAVENOUS | Status: DC

## 2018-11-09 MED ORDER — MIDAZOLAM HCL (PF) 5 MG/ML IJ SOLN
INTRAMUSCULAR | Status: AC
  Filled 2018-11-09: qty 2

## 2018-11-09 MED ORDER — PHENYLEPHRINE HCL 0.25 % NA SOLN
NASAL | Status: DC | PRN
  Administered 2018-11-09: 2 via NASAL

## 2018-11-09 MED ORDER — APIXABAN 5 MG PO TABS: 5.0000 mg | ORAL_TABLET | Freq: Two times a day (BID) | ORAL | Status: DC

## 2018-11-09 MED ORDER — LIDOCAINE HCL 2 % EX GEL
1.0000 "application " | Freq: Once | CUTANEOUS | Status: DC
  Filled 2018-11-09: qty 4250

## 2018-11-09 MED ORDER — APIXABAN 5 MG PO TABS: 5.0000 mg | ORAL_TABLET | Freq: Two times a day (BID) | ORAL | 3 refills | Status: DC

## 2018-11-09 MED ORDER — LIDOCAINE HCL URETHRAL/MUCOSAL 2 % EX GEL
CUTANEOUS | Status: DC | PRN
  Administered 2018-11-09: 1

## 2018-11-09 MED ORDER — PHENYLEPHRINE HCL 0.25 % NA SOLN
1.0000 | Freq: Four times a day (QID) | NASAL | Status: DC | PRN
  Filled 2018-11-09: qty 15

## 2018-11-09 MED ORDER — LIDOCAINE HCL 1 % IJ SOLN
INTRAMUSCULAR | Status: DC | PRN
  Administered 2018-11-09: 6 mL via RESPIRATORY_TRACT

## 2018-11-09 MED ORDER — FENTANYL CITRATE (PF) 100 MCG/2ML IJ SOLN
INTRAMUSCULAR | Status: DC | PRN
  Administered 2018-11-09 (×2): 25 ug via INTRAVENOUS
  Administered 2018-11-09: 50 ug via INTRAVENOUS

## 2018-11-09 MED ORDER — MIDAZOLAM HCL (PF) 10 MG/2ML IJ SOLN
INTRAMUSCULAR | Status: DC | PRN
  Administered 2018-11-09 (×2): 1 mg via INTRAVENOUS

## 2018-11-09 MED ORDER — MAGNESIUM SULFATE 2 GM/50ML IV SOLN
2.0000 g | Freq: Once | INTRAVENOUS | Status: AC
  Administered 2018-11-09: 2 g via INTRAVENOUS
  Filled 2018-11-09: qty 50

## 2018-11-09 MED ORDER — POTASSIUM CHLORIDE CRYS ER 20 MEQ PO TBCR
20.0000 meq | EXTENDED_RELEASE_TABLET | Freq: Once | ORAL | Status: AC
  Administered 2018-11-09: 20 meq via ORAL
  Filled 2018-11-09: qty 1

## 2018-11-09 NOTE — Op Note (Signed)
Indication : RLL lung mass in this ex smoker with new onset atrial fibrillation  Written informed consent was obtained prior to the procedure. The risks of the procedure including coughing, bleeding and the small chance of lung puncture requiring chest tube were discussed in great detail. The benefits & alternatives including serial follow up were also discussed.   2 mg versed & 100  mcg fentnayl used in divided doses during the procedure Bronchoscope entered from the left nare. Upper airway nml - floppy epiglottis obstructing the airway during induced sleep Vocal cords showed nml appearance & motion. Trachea & bronchial tree examined to the subsegmental level. Mild amount of slimy white secretions suctioned from left mainstem. Large endobronchial lesion seen blocking Br. Intermedius after take off of RUL. Brushing &Endo bronchial biopsies x 2 were obtained from the RLL under direct vision. BAL was also obtained from the Br intermedius.  There was  Moderate bleeding & hence further biopsies were not obtained. Hemostasis was achieved with cold saline prior & after each pass   Joe Cole V.  [phone]

## 2018-11-09 NOTE — Care Management Important Message (Signed)
Important Message  Patient Details  Name: Joe Cole MRN: [id number] Date of Birth: October 09, 1950   Medicare Important Message Given:  Yes     Shelda Altes 11/09/2018, 2:43 PM

## 2018-11-09 NOTE — Progress Notes (Signed)
[facility]         [phone] ________________________________  Initial inpatient Consultation  Name: Joe Cole MRN: [id number]  Date: 11/02/2018  DOB: 1950/07/12  REFERRING PHYSICIAN: Kara Mead, MD  DIAGNOSIS: 68 yo man with suspected right lower lung cancer, pending pathology.    ICD-10-CM   1. COPD with emphysema ([place])  J43.9 DG Chest Port 1 View    DG Chest Port 1 View  2. Other emphysema ([place])  J43.8     HISTORY OF PRESENT ILLNESS::Joe Cole is a 68 y.o. male who is followed in pulmonary office for COPD with emphysema.  He had a recent exacerbation and slow to improve.  CXR on 10/19/18 showed RML consolidation.      CT chest on 10/20/18 showed borderline LAN, emphysema, and mass in the right perihilar region.     PET scan on 10/29/18 showed a mass with SUV 19.6 in right hilar region with obstruction of RML, and 7.6 SUV RLL potentially representing post-obstructive changes.   He underwent bronchoscopy this morning with Dr. Davonna Belling revealing a large endobronchial lesion obstructing the bronchus intermedius after take off of RUL.  Brushing &Endo bronchial biopsies x 2 were obtained from the RLL under direct vision.  Due to suspicion for malignancy, the patient was referred for inpatient consult.  PREVIOUS RADIATION THERAPY: No  Past Medical History:  Diagnosis Date   Arthritis    Atrial fibrillation ([place]) 10/2018   COPD with emphysema ([place])    Dyspnea    Hypertension   :  Past Surgical History:  Procedure Laterality Date   KNEE ARTHROSCOPY     BIL    TOE SURGERY     PINNED LEFT BIG TOE   TONSILLECTOMY     T+A  AS CHILD   TOTAL KNEE ARTHROPLASTY Left 08/19/2017   Procedure: TOTAL KNEE ARTHROPLASTY;  Surgeon: Renette Butters, MD;  Location: [facility];  Service: Orthopedics;  Laterality: Left;   VIDEO BRONCHOSCOPY Bilateral 11/05/2018   Procedure: VIDEO BRONCHOSCOPY WITH FLUORO;  Surgeon: [name], MD;  Location: [facility];  Service: Cardiopulmonary;  Laterality: Bilateral;  :   Current Facility-Administered Medications:    0.9 %  sodium chloride infusion, , Intravenous, PRN, [name], MD, Last Rate: 10 mL/hr at 11/09/18 0623, 1,000 mL at 11/09/18 0623   0.9 %  sodium chloride infusion, , Intravenous, Continuous, Rigoberto Noel, MD   acetaminophen (TYLENOL) tablet 650 mg, 650 mg, Oral, Q6H PRN, [name], MD   atorvastatin (LIPITOR) tablet 20 mg, 20 mg, Oral, Daily, Sood, Vineet, MD, 20 mg at 11/09/18 1006   diltiazem (CARDIZEM CD) 24 hr capsule 360 mg, 360 mg, Oral, Daily, Skains, Mark C, MD, 360 mg at 11/09/18 1006   ipratropium (ATROVENT) nebulizer solution 0.5 mg, 0.5 mg, Nebulization, Q4H PRN, [name], MD   levalbuterol (XOPENEX) nebulizer solution 0.63 mg, 0.63 mg, Nebulization, Q4H PRN, [name], MD:  No Known Allergies:  Family History  Problem Relation Age of Onset   Atrial fibrillation Mother    Emphysema Father    Pneumonia Father   :  Social History   Socioeconomic History   Marital status: Married    Spouse name: Not on file   Number of children: Not on file   Years of education: Not on file   Highest education level: Not on file  Occupational History   Not on file  Social Needs   Financial resource strain: Not on file  Food insecurity    Worry: Not on file    Inability: Not on file   Transportation needs    Medical: Not on file    Non-medical: Not on file  Tobacco Use   Smoking status: Former Smoker    Packs/day: 1.50    Years: 46.00    Pack years: 69.00    Types: Cigarettes    Quit date: 09/08/2018    Years since quitting: 0.1   Smokeless tobacco: Never Used  Substance and Sexual Activity   Alcohol use: Yes    Alcohol/week: 19.0 standard drinks    Types: 7 Cans of beer, 12 Shots of liquor per week    Comment: FEW DRINKS AFTER WORK   Drug use: Never   Sexual activity: Not on file  Lifestyle   Physical activity     Days per week: Not on file    Minutes per session: Not on file   Stress: Not on file  Relationships   Social connections    Talks on phone: Not on file    Gets together: Not on file    Attends religious service: Not on file    Active member of club or organization: Not on file    Attends meetings of clubs or organizations: Not on file    Relationship status: Not on file   Intimate partner violence    Fear of current or ex partner: Not on file    Emotionally abused: Not on file    Physically abused: Not on file    Forced sexual activity: Not on file  Other Topics Concern   Not on file  Social History Narrative   Not on file  :  REVIEW OF SYSTEMS:  A 15 point review of systems is documented in the electronic medical record. This was obtained by the nursing staff. However, I reviewed this with the patient to discuss relevant findings and make appropriate changes.  Pertinent items are noted in HPI.   PHYSICAL EXAM:  Blood pressure 120/83, pulse 69, temperature (!) 97.5 F (36.4 C), temperature source Oral, resp. rate 18, height 6\' 1"  (1.854 m), weight 172 lb 9.9 oz (78.3 kg), SpO2 100 %. The patient is in no acute distress.  He is breathing comfortably, and conversing without difficulty.  He is neurologically intact.  KPS = 90  100 - Normal; no complaints; no evidence of disease. 90   - Able to carry on normal activity; minor signs or symptoms of disease. 80   - Normal activity with effort; some signs or symptoms of disease. 31   - Cares for self; unable to carry on normal activity or to do active work. 60   - Requires occasional assistance, but is able to care for most of his personal needs. 50   - Requires considerable assistance and frequent medical care. 95   - Disabled; requires special care and assistance. 104   - Severely disabled; hospital admission is indicated although death not imminent. 81   - Very sick; hospital admission necessary; active supportive treatment  necessary. 10   - Moribund; fatal processes progressing rapidly. 0     - Dead  Karnofsky DA, Abelmann [place], Craver LS and Burchenal [facility] [phone]) The use of the nitrogen mustards in the palliative treatment of carcinoma: with particular reference to bronchogenic carcinoma Cancer 1 [id number]  LABORATORY DATA:  Lab Results  Component Value Date   WBC 5.3 11/09/2018   HGB 12.7 (L) 11/09/2018   HCT 36.8 (L) 11/09/2018  MCV 106.4 (H) 11/09/2018   PLT 246 11/09/2018   Lab Results  Component Value Date   NA 132 (L) 11/07/2018   K 3.6 11/07/2018   CL 98 11/07/2018   CO2 23 11/07/2018   Lab Results  Component Value Date   ALT 14 11/05/2018   AST 27 11/05/2018   ALKPHOS 61 11/05/2018   BILITOT 1.6 (H) 11/05/2018     RADIOGRAPHY: Dg Chest 2 View  Result Date: 10/19/2018 CLINICAL DATA:  Shortness of breath.  Mixed type COPD. EXAM: CHEST - 2 VIEW COMPARISON:  02/25/2018 FINDINGS: There is consolidation and atelectasis in the right middle lobe with a tiny right effusion. The lungs are otherwise clear. Heart size and vascularity are normal. Aortic atherosclerosis. No acute bone abnormalities. IMPRESSION: Consolidation and atelectasis in the right middle lobe with a tiny right effusion. This could represent pneumonia. However, possibility of an obstructing mass in the right middle lobe bronchus should be considered as well. Electronically Signed   By: Lorriane Shire M.D.   On: 10/19/2018 15:55   Ct Angio Chest W/cm &/or Wo Cm  Result Date: 10/20/2018 CLINICAL DATA:  Shortness of breath and elevated D-dimer EXAM: CT ANGIOGRAPHY CHEST WITH CONTRAST TECHNIQUE: Multidetector CT imaging of the chest was performed using the standard protocol during bolus administration of intravenous contrast. Multiplanar CT image reconstructions and MIPs were obtained to evaluate the vascular anatomy. CONTRAST:  81mL ISOVUE-370 IOPAMIDOL (ISOVUE-370) INJECTION 76% COMPARISON:  Chest radiograph October 19, 2018 FINDINGS:  Cardiovascular: There is no demonstrable pulmonary embolus. There is no thoracic aortic aneurysm or dissection. There are foci of calcification in visualized great vessels as well as foci of aortic atherosclerosis. There is no appreciable pericardial effusion or pericardial thickening. Mediastinum/Nodes: Thyroid appears unremarkable. Multiple subcentimeter mediastinal lymph nodes are evident. There is a lymph node anterior to the carina measuring 1.5 x 1.2 cm. There is inferior right hilar adenopathy measuring 2.2 x 1.8 cm. No esophageal lesions are evident. Lungs/Pleura: There is underlying centrilobular and paraseptal emphysematous change. There is an apparent mass arising in the right perihilar region extending into the right middle lobe with consolidation throughout much of the right middle lobe, particularly medially. There is a small right pleural effusion with consolidation in portions of the right lower lobe. Note that there is apparent mass around in the proximal right middle and lower lobe bronchi which contributes to the volume loss and consolidation in the right middle and lower lobes. The left lung is clear. Note that there is mild lower lobe bronchiectatic change on the left. Upper Abdomen: There is upper abdominal aortic and visualized great vessel atherosclerosis. Visualized upper abdominal structures otherwise appear unremarkable. Musculoskeletal: There are foci of degenerative change in the thoracic spine. There are no blastic or lytic bone lesions. No evident chest wall lesions. Review of the MIP images confirms the above findings. IMPRESSION: 1. No demonstrable pulmonary embolus. No thoracic aortic aneurysm or dissection. There is aortic and great vessel atherosclerotic calcification. 2. 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 There is airspace consolidation throughout the medial segment right middle lobe and to  a lesser extent patchy airspace consolidation in portions of the right lower lobe. It is difficult to ascertain how much of the opacity in the right perihilar region represents tumor versus immediately adjacent adenopathy. There is felt to be adenopathy in the inferior hilum on the right measuring 2.2 x 1.8 cm. There is adenopathy  anterior to the carina as well. 3.  Small right pleural effusion. 4. There is a degree of underlying centrilobular emphysematous change. There is left lower lobe bronchiectatic change. Aortic Atherosclerosis (ICD10-I70.0) and Emphysema (ICD10-J43.9). These results will be called to the ordering clinician or representative by the Radiologist Assistant, and communication documented in the PACS or zVision Dashboard. Electronically Signed   By: Lowella Grip III M.D.   On: 10/20/2018 12:17   Nm Pet Image Initial (pi) Skull Base To Thigh  Result Date: 10/29/2018 CLINICAL DATA:  Initial treatment strategy for RIGHT lung mass. EXAM: NUCLEAR MEDICINE PET SKULL BASE TO THIGH TECHNIQUE: 9.0 mCi F-18 FDG was injected intravenously. Full-ring PET imaging was performed from the skull base to thigh after the radiotracer. CT data was obtained and used for attenuation correction and anatomic localization. Fasting blood glucose: 92 mg/dl COMPARISON:  Chest CT October 20, 2018 FINDINGS: Mediastinal blood pool activity: SUV max 2.87 Liver activity: SUV max NA NECK: No hypermetabolic lymph nodes in the neck. Incidental CT findings: CHEST: Hypermetabolic mass at the RIGHT hilum measures 2.9 cm and is intensely hypermetabolic with SUV max equal 19.6. Mass obstructs the RIGHT middle lobe with complete collapse of the RIGHT middle lobe. Within the collapsed RIGHT middle lobe there is a focus of metabolic activity along the mediastinal/cardiac cardiac border with SUV max equal 4.8. This region activity measures approximately 1.5 cm but is not appreciated on CT portion exam. There is hypermetabolic activity  associated with a ground-glass atelectasis in the periphery of the RIGHT lower lobe which is intense with SUV max equal 7.6 however there is no nodularity associated with this activity. Additional focus of hypermetabolic with thickening in the RIGHT lower lobe with SUV max equal 3.8 (image 52/8). No distinct hypermetabolic mediastinal lymph nodes. No LEFT lung lesion. Incidental CT findings: RIGHT pleural effusion. ABDOMEN/PELVIS: There is mild metabolic activity through the GE junction which is favored physiologic. No evidence of liver metastasis. Adrenal glands are normal. No hypermetabolic abdominopelvic lymph nodes. Incidental CT findings: Atherosclerotic calcification of the aorta. Diverticulosis of the sigmoid colon. SKELETON: No hypermetabolic lesions in the skeleton. Incidental CT findings: Severe degenerative changes of lumbar spine IMPRESSION: 1. Hypermetabolic mass at the RIGHT hila with postobstructive collapse of RIGHT middle lobe. Findings most consistent with METASTATIC BRONCHOGENIC CARCINOMA. 2. Potential hypermetabolic nodule within the collapsed RIGHT middle lobe. 3. Intense metabolic activity in the RIGHT lower lobe is favored post obstructive inflammation/infection. 4. No metastatic mediastinal lymphadenopathy. 5. Small RIGHT effusion. These results will be called to the ordering clinician or representative by the Radiologist Assistant, and communication documented in the PACS or zVision Dashboard. Electronically Signed   By: Suzy Bouchard M.D.   On: 10/29/2018 09:56   Dg Chest Port 1 View  Result Date: 11/05/2018 CLINICAL DATA:  COPD.  Emphysema. EXAM: PORTABLE CHEST 1 VIEW COMPARISON:  CT 10/29/2018. Chest x-ray 10/19/2018. Chest x-ray 02/25/2018. FINDINGS: Mediastinum and hilar structures normal. Right infrahilar mass with atelectasis/infiltrate again noted. Stable cardiomegaly with prominent central pulmonary arteries suggesting pulmonary hypertension. IMPRESSION: 1. Right infrahilar  mass with right base atelectasis/infiltrate again noted. Similar finding noted on prior exam. 2. Stable cardiomegaly with prominent central pulmonary arteries suggesting pulmonary hypertension. Electronically Signed   By: Marcello Moores  Register   On: 11/05/2018 16:36      IMPRESSION: This is a 68 yo man with suspected stage IIIA lung cancer, pending pathology.    PLAN:Today, I talked to the patient and family about the findings and work-up  thus far.  We discussed the natural history of locally advanced lung cancer and general treatment, highlighting the role of radiotherapy in the management.  We discussed the available radiation techniques, and focused on the details of logistics and delivery.  We reviewed the anticipated acute and late sequelae associated with radiation in this setting.  The patient would like to proceed with radiation and will be scheduled for CT simulation later this week to permit return of pathology and discussion in the [facility] conference.  I spent time face to face with the patient and more than 50% of that time was spent in counseling and/or coordination of care.   ------------------------------------------------   Tyler Pita, MD [place] Director and Director of Stereotactic Radiosurgery Direct Dial: [phone]   Fax: [phone] [email or web address]   Skype   [email or web address]

## 2018-11-09 NOTE — Progress Notes (Signed)
[facility] for apixaban Indication: atrial fibrillation  No Known Allergies  Patient Measurements: Height: 6\' 1"  (185.4 cm) Weight: (pt off floor) IBW/kg (Calculated) : 79.9 Heparin Dosing Weight: 78.3 kg  Vital Signs: Temp: 97.5 F (36.4 C) (07/13 0645) Temp Source: Oral (07/13 0645) BP: 120/83 (07/13 1006) Pulse Rate: 69 (07/13 1005)  Labs: Recent Labs    11/07/18 0404 11/08/18 0411 11/08/18 1343 11/08/18 2302 11/09/18 0556  HGB 12.9* 11.8*  --   --  12.7*  HCT 36.5* 34.1*  --   --  36.8*  PLT 252 233  --   --  246  HEPARINUNFRC 0.50 0.28* 0.49 0.38  --   CREATININE 0.90  --   --   --   --     Estimated Creatinine Clearance: 89.4 mL/min (by C-G formula based on SCr of 0.9 mg/dL).  Assessment: 48 yom with hx of Rt lung mass presenting for bronchoscopy and found to be in Afib with RVR. No PTA AC. He is s/p bronchoscopy and pharmacy consulted to transition to apixaban tonight.  No bleeding noted, Hgb is stable, platelets are normal. Full dose apixaban appropriate for age and weight.  Plan:  Apixaban 5 mg PO bid Educated prior to d/c Pharmacy signing off but will continue to follow peripherally   Thank you for involving pharmacy in this patient's care.  Renold Genta, PharmD, BCPS Clinical Pharmacist Clinical phone for 11/09/2018 until 3p is [phone] 11/09/2018 11:17 AM  **Pharmacist phone directory can be found on [email or web address] listed under [place]**

## 2018-11-09 NOTE — Progress Notes (Signed)
Video Bronchoscopy  Done  Intervention Bronchial washing Intervention Bronchial brushing Intervention Bronchial biopsy Procedure tolerated well

## 2018-11-09 NOTE — Progress Notes (Addendum)
Progress Note  Patient Name: Joe Cole Date of Encounter: 11/09/2018  Primary Cardiologist: Joe Furbish, MD  Subjective   Pt remains asymptomatic. Per d/w nurse she spoke with pulm NP and they plan to start Eliquis this afternoon. Care management involved. Pt not currently on telemetry post procedure but HR sounds irregular still.  Inpatient Medications    Scheduled Meds: . atorvastatin  20 mg Oral Daily  . diltiazem  360 mg Oral Daily   Continuous Infusions: . sodium chloride 1,000 mL (11/09/18 0623)  . sodium chloride     PRN Meds: sodium chloride, acetaminophen, ipratropium, levalbuterol   Vital Signs    Vitals:   11/09/18 0905 11/09/18 0910 11/09/18 1005 11/09/18 1006  BP: 133/81 119/72  120/83  Pulse:   69   Resp: 18 18    Temp:      TempSrc:      SpO2: 96% 95% 100%   Weight:      Height:        Intake/Output Summary (Last 24 hours) at 11/09/2018 1024 Last data filed at 11/09/2018 0998 Gross per 24 hour  Intake 237.43 ml  Output 800 ml  Net -562.57 ml   Last 3 Weights 11/09/2018 11/07/2018 11/06/2018  Weight (lbs) (No Data) 172 lb 9.9 oz 173 lb 14.4 oz  Weight (kg) (No Data) 78.3 kg 78.881 kg     Telemetry    Previously AF HR controlled, 5 beats NSVT - Personally Reviewed  Physical Exam   GEN: No acute distress.  HEENT: Normocephalic, atraumatic, sclera non-icteric. Neck: No JVD or bruits. Cardiac: Irregularly irregular, distant heart sounds, no murmurs, rubs, or gallops.  Radials/DP/PT 1+ and equal bilaterally.  Respiratory: Markedly diminished BS throughout without wheezes, rales or rhonchi. Breathing is unlabored. GI: Soft, nontender, non-distended, BS +x 4. MS: no deformity. Extremities: No clubbing or cyanosis. No edema. Distal pedal pulses are 2+ and equal bilaterally. Neuro:  AAOx3. Follows commands. Psych:  Responds to questions appropriately with a normal affect.  Labs    Chemistry Recent Labs  Lab 11/05/18 1629 11/06/18  1047 11/07/18 0404  NA 132* 130* 132*  K 5.1 3.5 3.6  CL 98 96* 98  CO2 22 24 23   GLUCOSE 134* 102* 103*  BUN 9 10 9   CREATININE 0.93 0.88 0.90  CALCIUM 9.0 8.8* 8.8*  PROT 6.1*  --   --   ALBUMIN 3.2*  --   --   AST 27  --   --   ALT 14  --   --   ALKPHOS 61  --   --   BILITOT 1.6*  --   --   GFRNONAA >60 >60 >60  GFRAA >60 >60 >60  ANIONGAP 12 10 11      Hematology Recent Labs  Lab 11/07/18 0404 11/08/18 0411 11/09/18 0556  WBC 5.9 4.9 5.3  RBC 3.46* 3.26* 3.46*  HGB 12.9* 11.8* 12.7*  HCT 36.5* 34.1* 36.8*  MCV 105.5* 104.6* 106.4*  MCH 37.3* 36.2* 36.7*  MCHC 35.3 34.6 34.5  RDW 14.0 13.9 14.1  PLT 252 233 246     Radiology    No results found.  Cardiac Studies   2D Echo 11/06/18 IMPRESSIONS    1. The left ventricle has low normal systolic function, with an ejection fraction of 50-55%. The cavity size was mildly dilated. Left ventricular diastolic Doppler parameters are indeterminate.  2. Extremely poor acoustic windows limit study.  3. The right ventricle was not well visualized. The cavity  was moderately enlarged. There is not assessed.  4. RV is not well seen RVEF is at least mildly depressed.  5. Left atrial size was severely dilated.  6. Right atrial size was severely dilated.  7. The mitral valve is abnormal. Mild thickening of the mitral valve leaflet. There is mild mitral annular calcification present.  8. The tricuspid valve is grossly normal.  9. The aortic valve is abnormal. Mild thickening of the aortic valve. Mild calcification of the aortic valve. Aortic valve regurgitation is trivial by color flow Doppler. 10. The inferior vena cava was dilated in size with <50% respiratory variability.  Patient Profile     68 y.o. male with former tobacco abuse, HTN, HLD, COPD/emphysema and newly diagnosed right lung mass who presented for bronchoscopy and was found to be in asymptomatic atrial fib RVR, prompting procedure to be post-poned.   Assessment & Plan    1. Right infrahilar mass - s/p bronchoscopy today, pulm managing. Path pending.  2. New onset AF RVR - TSH wnl. LA is severely dilated. Would not attempt plan for DCCV until it is known that all invasive procedures are complete. Recommend transition to [place] when OK with pulmonology - from nurse report, may be starting Eliquis this afternoon.  3. NSVT vs AF with abberrancy - lytes 7/11 not at goal, recheck this AM with goal K 4.0 or greater and Mg 2.0 or greater. EF 50-55%. Will discuss continuation of current regimen with MD (vs addition of BB).  4. HTN - home losartan/HCTZ and amlodipine on hold to allow room for AVN blocking titration if needed. Now on diltiazem.  5. Electrolyte abnormalities with hypomagnesemia/hyperkalemia/hyponatremia - last dose of Mag sulfate 7/10 with Mg level 1.6 on 7/11. Recheck given another 5 beats NSVT this AM.  Tentatively arranged f/u 9am on 7/30 with Joe Ingold NP.  For questions or updates, please contact [place] Please consult [email or web address] for contact info under Cardiology/STEMI.  Signed, Joe Pitter, PA-C 11/09/2018, 10:24 AM    ---------------------------------------------------------------------------------------------   History and all data above reviewed.  Patient examined.  I agree with the findings as above.  Joe Cole feeling well after bronchoscopy.   Constitutional: No acute distress Eyes: pupils equally round and reactive to light, sclera non-icteric, normal conjunctiva and lids ENMT: normal dentition, moist mucous membranes Cardiovascular: irregular rhythm, normal rate, no murmurs. S1 and S2 normal. Radial pulses normal bilaterally. No jugular venous distention.  Respiratory: clear to auscultation bilaterally GI : normal bowel sounds, soft and nontender. No distention.   MSK: extremities warm, well perfused. No edema.  NEURO: grossly nonfocal exam, moves all extremities. PSYCH: alert and oriented x  3, normal mood and affect.   All available labs, radiology testing, previous records reviewed. Agree with documented assessment and plan of my colleague as stated above with the following additions or changes:  Active Problems:   Atrial fibrillation (HCC)   Right lower lobe lung mass    Plan: agree with documentation as above.  Received diltiazem 360 mg daily now, rate is well controlled currently. F/u has been arranged. Mg will be rechecked and repleted as needed by primary service.  Time Spent Directly with Patient:  I have spent a total of 35 minutes with the patient reviewing hospital notes, telemetry, EKGs, labs and examining the patient as well as establishing an assessment and plan that was discussed personally with the patient.  > 50% of time was spent in direct patient care.  Length of Stay:  LOS: 4 days   Elouise Munroe, MD [facility] 11:26 AM  11/09/2018

## 2018-11-09 NOTE — TOC Benefit Eligibility Note (Signed)
Transition of Care [facility]) Benefit Eligibility Note    Patient Details  Name: Joe Cole MRN: [id number] Date of Birth: May 19, 1950   Medication/Dose: Eliquis 5 mg po twice daily  Covered?: Yes  Tier: 3 Drug  Prescription Coverage [facility]  Spoke with Person/Company/Phone Number:: Louie Casa  Co-Pay: 5 for 30 is 47.00  retail and mail order  Prior Approval: No  Deductible: (250.00)  Additional Notes: Could not tell if Deductible was met or not rep could not tell if met or not    Orbie Pyo Phone Number: 11/09/2018, 12:02 PM

## 2018-11-09 NOTE — Discharge Summary (Signed)
Physician Discharge Summary  Patient ID: ANAKIN VARKEY MRN: [id number] DOB/AGE: 67-May-1952 67 y.o.  Admit date: 11/05/2018 Discharge date: 11/09/2018    Discharge Diagnoses:  Atrial Fibrillation with RVR - newly identified this admission Non-Sustained VT HTN Right Lung Mass  COPD with Emphysema  Hypomagnesemia  Hyperkalemia                                                                      DISCHARGE PLAN BY DIAGNOSIS     Atrial Fibrillation with RVR - newly identified this admission Non-Sustained VT HTN  Discharge Plan: Eliquis BID to start evening of 7/13 Continue Cardizem 360 mg daily  Follow up with Cardiology as arranged   Right Lung Mass  COPD with Emphysema   Discharge Plan: Follow up pathology results with Pulmonary  Pending results of Pathology, patient will need ONC referral  Radiation ONC consulted during hospitalization Reviewed risk of hemoptysis with patient post procedure and when to return to ER.  Patient indicates understanding.   Hypomagnesemia  Hyperkalemia   Discharge Plan: Follow up BMP, Mg on Pulmonary office visit   Goal K >4, Mg >2                    DISCHARGE SUMMARY   68 y/o M, former smoker, with COPD & emphysema, followed by Dr. Halford Chessman, who presented 7/9 for planned bronchoscopy in the setting of right hilar mass.  On presentation to bronchoscopy lab, he was noted to be in AF with RVR in 160's.  The procedure was aborted and patient was admitted for further evaluation of AF.  Cardiology was consulted for evaluation of atrial fibrillation.  The patient was started on IV heparin and cardizem for rate control.  Additionally, he had hypomagnesemia & hypokalemia during hospitalization which were replaced. The patient ultimately underwent bronchoscopy on 7/13 per Dr. Elsworth Soho which showed normal vocal cords and airways to the subsegmental level.  A large endobronchial lesion was seen in the RUL and biopsy x2 + brushings were obtained.  BAL obtained  as well.  The patient was observed post bronchoscopy.  He tolerated procedure well and was cleared for discharge 7/13 pm with plans to start Eliquis for AF.  The patient is planned for follow up with Cardiology, Pulmonary and will need oncology evaluation pending pathology review.      Past Medical History  HTN, Arthritis, COPD  Significant Hospital Events   7/09 Admit 7/13 FOB for lung mass   Consults:  7/09  Cardiology - Dr. Marlou Porch 7/13  Radiation Oncology - Dr. Tammi Klippel  Procedures:  FOB 7/13 >>   Significant Diagnostic Tests:  PFT 05/04/18 >> FEV1 2.35 (64%), FEV1% 59, TLC 9.00 (121%), RV 4.89 (197%), DLCO 60% A1AT level 05/04/18 >> 167, MM CTA chest 10/20/18 >> no PE, apparent mass arising from right hilum with apparent tumor surrounding the proximal right middle and lower lobe bronchi, there is airspace consolidation throughout the medial segment right middle lobe to a lesser extent patchy airspace consolidation of portions of the right lower lobe, there is felt to be adenopathy in the inferior hilum on the right measuring 2.2 x 1.8 cm  CXR 11/05/2018 >> Right infrahilar mass with right base atelectasis/infiltrate again noted. Similar finding  noted on prior exam. ECHO 7/10 >> LVEF 50-55%, mildly dilated LV, poor windows, LA/RA severe dilatation.   FOB 7/13 >>    Micro Data:  SARS-CoV-2-test 6/30 >> negative AFB 7/13 >>  Fungus 7/13 >>    Discharge Exam: General: adult male sitting up in bed in NAD Neuro: AAOx4, speech clear, MAE CV: s1s2 rrr, no m/r/g PULM: even/non-labored, lungs bilaterally clear  GI: soft/non-tender, bsx4 active  Extremities: warm/dry, no edema  Vitals:   11/09/18 0905 11/09/18 0910 11/09/18 1005 11/09/18 1006  BP: 133/81 119/72  120/83  Pulse:   69   Resp: 18 18    Temp:      TempSrc:      SpO2: 96% 95% 100%   Weight:      Height:         Discharge Labs  BMET Recent Labs  Lab 11/05/18 1629 11/06/18 1047 11/07/18 0404  11/09/18 1053  NA 132* 130* 132* 133*  K 5.1 3.5 3.6 3.9  CL 98 96* 98 101  CO2 _0 GLUCOSE 134* 102* 103* 155*  BUN _1 CREATININE 0.93 0.88 0.90 0.81  CALCIUM 9.0 8.8* 8.8* 9.2  MG 1.4* 1.4* 1.6* 1.5*  PHOS  --   --  3.9  --     CBC Recent Labs  Lab 11/07/18 0404 11/08/18 0411 11/09/18 0556  HGB 12.9* 11.8* 12.7*  HCT 36.5* 34.1* 36.8*  WBC 5.9 4.9 5.3  PLT 252 233 246    Anti-Coagulation No results for input(s): INR in the last 168 hours.  Discharge Instructions    Call MD for:  difficulty breathing, headache or visual disturbances   Complete by: As directed    Call MD for:  extreme fatigue   Complete by: As directed    Call MD for:  hives   Complete by: As directed    Call MD for:  persistant dizziness or light-headedness   Complete by: As directed    Call MD for:  persistant nausea and vomiting   Complete by: As directed    Call MD for:  redness, tenderness, or signs of infection (pain, swelling, redness, odor or green/yellow discharge around incision site)   Complete by: As directed    Call MD for:  severe uncontrolled pain   Complete by: As directed    Call MD for:  temperature >100.4   Complete by: As directed    Diet - low sodium heart healthy   Complete by: As directed    Discharge instructions   Complete by: As directed    1.  Review your medication list carefully as your medications have changed.  If the medication was stopped during this hospitalization, please put away in a separate place to avoid accidental ingestion.   2.  Follow up with Pulmonary & Cardiology as arranged  3.  We will follow up with your pathology results in the Pulmonary Clinic  4.  Return to the ER immediately if you have new or worsening symptoms such as new large volume bleeding, coughing up bright red blood, blood from rectum or bleeding that you can not stop.  5.  Begin Eliquis as ordered.   Increase activity slowly   Complete by: As directed       Follow-up Information    Isaiah Serge, NP Follow up.   Specialties: Cardiology, Radiology Why: [facility] location - 11/26/18 at 9am. Please arrive 15 minutes prior to appointment  to check in. Mickel Baas is one of the nurse practitioners with Dr. Marlou Porch and the cardiology team. Contact information: [place] [address] [phone]        Magdalen Spatz, NP Follow up on 11/19/2018.   Specialty: Pulmonary Disease Why: Appt at 10:00 AM.  Please arrive at 9:45 am for check in.  Contact information: [place] [address] [phone]            Allergies as of 11/09/2018   No Known Allergies     Medication List    STOP taking these medications   amLODipine 10 MG tablet Commonly known as: NORVASC   losartan-hydrochlorothiazide 100-25 MG tablet Commonly known as: HYZAAR     TAKE these medications   albuterol 108 (90 Base) MCG/ACT inhaler Commonly known as: ProAir HFA Inhale 2 puffs into the lungs every 6 (six) hours as needed for wheezing or shortness of breath.   Align 4 MG Caps Take 4 mg by mouth daily.   Anoro Ellipta 62.5-25 MCG/INH Aepb Generic drug: umeclidinium-vilanterol Inhale 1 puff into the lungs daily.   apixaban 5 MG Tabs tablet Commonly known as: ELIQUIS Take 1 tablet (5 mg total) by mouth 2 (two) times daily.   atorvastatin 20 MG tablet Commonly known as: LIPITOR Take 20 mg by mouth daily.   diltiazem 360 MG 24 hr capsule Commonly known as: CARDIZEM CD Take 1 capsule (360 mg total) by mouth daily. Start taking on: November 10, 2018   diphenhydramine-acetaminophen 25-500 MG Tabs tablet Commonly known as: TYLENOL PM Take 2 tablets by mouth at bedtime as needed (sleep).   ibuprofen 200 MG tablet Commonly known as: ADVIL Take 400 mg by mouth every 6 (six) hours as needed for headache or moderate pain.         Disposition:  Home.  Plan for follow up with Cardiology, Pulmonary  & likely ONC once pathology returned.   Discharged Condition: DEWAINE MOROCHO has met maximum benefit of inpatient care and is medically stable and cleared for discharge.  Patient is pending follow up as above.      Time spent on disposition:  35 Minutes.   Signed: Noe Gens, NP-C [facility] Pgr: [phone] Office: [phone]

## 2018-11-10 ENCOUNTER — Telehealth: Payer: Self-pay | Admitting: *Deleted

## 2018-11-10 DIAGNOSIS — C3431 Malignant neoplasm of lower lobe, right bronchus or lung: Secondary | ICD-10-CM

## 2018-11-10 LAB — ACID FAST SMEAR (AFB, MYCOBACTERIA): Acid Fast Smear: NEGATIVE

## 2018-11-10 NOTE — Telephone Encounter (Signed)
Oncology Nurse Navigator Documentation  Oncology Nurse Navigator Flowsheets 11/10/2018  Navigator Location [facility]  Referral Date to RadOnc/MedOnc 11/09/2018  Navigator Encounter Type Telephone/I received referral on Joe Cole yesterday.  I called patient and wife to update.  He verbalized understanding of appt time and place.   Telephone Outgoing Call  Abnormal Finding Date 10/19/2018  Confirmed Diagnosis Date 11/09/2018  Treatment Phase Pre-Tx/Tx Discussion  Barriers/Navigation Needs Coordination of Care;Education  Education Other  Interventions Coordination of Care;Education  Coordination of Care Appts  Education Method Verbal  Acuity Level 2  Time Spent with Patient 80

## 2018-11-11 ENCOUNTER — Encounter (HOSPITAL_COMMUNITY): Payer: Self-pay | Admitting: Pulmonary Disease

## 2018-11-11 ENCOUNTER — Encounter: Payer: Self-pay | Admitting: Urology

## 2018-11-11 NOTE — Progress Notes (Signed)
Final pathology confirms NSCLC, squamous cell carcinoma.  He is scheduled for CT [facility] 11/12/18 at 10:30am for treatment planning and has an appointment with Dr. Julien Nordmann at 3pm same day to discuss concurrent chemotherapy.

## 2018-11-12 ENCOUNTER — Inpatient Hospital Stay: Payer: Medicare HMO | Admitting: Internal Medicine

## 2018-11-12 ENCOUNTER — Ambulatory Visit: Payer: Medicare HMO | Admitting: Radiation Oncology

## 2018-11-12 ENCOUNTER — Telehealth: Payer: Self-pay | Admitting: Pulmonary Disease

## 2018-11-12 ENCOUNTER — Other Ambulatory Visit: Payer: Self-pay | Admitting: *Deleted

## 2018-11-12 ENCOUNTER — Inpatient Hospital Stay: Payer: Medicare HMO

## 2018-11-12 ENCOUNTER — Telehealth: Payer: Self-pay | Admitting: Radiation Oncology

## 2018-11-12 MED ORDER — DOCUSATE SODIUM 100 MG PO CAPS: 100.0000 mg | ORAL_CAPSULE | Freq: Two times a day (BID) | ORAL | 0 refills | Status: DC

## 2018-11-12 NOTE — Progress Notes (Signed)
The proposed treatment discussed at cancer conference 11/12/2018 is for discussion purpose only and is not a binding recommendation.  The patient was not physically examined nor present for her treatment options.  Therefore, final treatment plans cannot be decided.

## 2018-11-12 NOTE — Telephone Encounter (Signed)
lmtcb for pt's spouse [name].

## 2018-11-12 NOTE — Telephone Encounter (Signed)
Patient already has a hospital follow-up scheduled with Sarah for next week.  I have ordered Colace for constipation.  She may also try warm prune juice.  Please try elevating legs, compression stockings, and low-sodium diet for edema.  If symptoms do not resolve patient needs to contact her PCP.  Please keep scheduled follow-up with Sarah next week.

## 2018-11-12 NOTE — Telephone Encounter (Signed)
Before reaching out to PCP as request phoned wife, [name], to obtain further details. [name] confirms that their primary care office called back after she spoke with Alm Bustard, RT. She explained she is expecting to hear back anytime now with further directions from the doctor. She confirms that patient has experienced constipation x 2 day and no intervention other than colace has been attempted. She confirms the rectal bleeding just began today. She denies additional needs at this time and plans to wait to hear back from PCP. Provided her with my direct contact number in the event she/the patient has needs later today or at a later point in time.

## 2018-11-12 NOTE — Telephone Encounter (Signed)
Called and spoke with pt's wife [name] letting her know that TN sent Rx colace to pharmacy for pt. Also stated to her all the other recs per TN and that if pt's symptoms did not resolve to contact PCP and she expressed understanding. Nothing further needed.

## 2018-11-12 NOTE — Telephone Encounter (Signed)
Called and spoke with pt's wife [name]. Stated to her that they needed to contact PCP in regards to the rectal bleeding especially since pt had been having the issues with the constipation and now is having the rectal bleeding. [name] verbalized understanding. Nothing further needed.

## 2018-11-12 NOTE — Telephone Encounter (Signed)
Spoke with pt's wife Joe Cole, states that pt has b/l feet and ankle swelling and painful constipation since hospital discharge.  Pt had an appt today with Dr. Mckinley Jewel to discuss CT and chemo plans, but pt had to cancel appt d/t constipation.   C/o abdominal discomfort from constipation, weakness, but no other respiratory symptoms at this time.  Wife notes pt was started on eliquis and cardizem while hospitalized earlier this month, no other med changes.    Pt has not taken anything asides from his maintenance align to help with s/s.  Requesting additional recs.   [facility] [address]   Sending to [place] as VS is in hospital today.  Tonya please advise on recs.  Thanks.

## 2018-11-12 NOTE — Telephone Encounter (Signed)
Pt wife returned missed call and would like a call back

## 2018-11-13 ENCOUNTER — Telehealth: Payer: Self-pay | Admitting: *Deleted

## 2018-11-13 NOTE — Telephone Encounter (Signed)
Oncology Nurse Navigator Documentation  Oncology Nurse Navigator Flowsheets 11/13/2018  Navigator Location [facility]  Referral Date to RadOnc/MedOnc -  Navigator Encounter Type Telephone/I received a message yesterday that patient was unable to make his appt with Dr. Julien Nordmann.  I called this am to re-schedule. I was unable to reach but did leave a vm message with my name and phone number to call.   Telephone Outgoing Call  Abnormal Finding Date -  Confirmed Diagnosis Date -  Treatment Phase Pre-Tx/Tx Discussion  Barriers/Navigation Needs Coordination of Care;Education  Education Other  Interventions Coordination of Care;Education  Coordination of Care Other  Education Method Verbal  Acuity Level 1  Time Spent with Patient 15

## 2018-11-16 ENCOUNTER — Ambulatory Visit: Payer: Medicare HMO | Admitting: Radiation Oncology

## 2018-11-17 ENCOUNTER — Other Ambulatory Visit: Payer: Self-pay

## 2018-11-17 ENCOUNTER — Ambulatory Visit
Admission: RE | Admit: 2018-11-17 | Discharge: 2018-11-17 | Disposition: A | Discharge status: Home / Self Care | Payer: Medicare HMO | Source: Ambulatory Visit | Attending: Radiation Oncology | Admitting: Radiation Oncology

## 2018-11-17 DIAGNOSIS — Z510 Encounter for antineoplastic radiation therapy: Secondary | ICD-10-CM | POA: Diagnosis not present

## 2018-11-17 DIAGNOSIS — Z87891 Personal history of nicotine dependence: Secondary | ICD-10-CM | POA: Diagnosis not present

## 2018-11-17 DIAGNOSIS — C3431 Malignant neoplasm of lower lobe, right bronchus or lung: Secondary | ICD-10-CM | POA: Diagnosis not present

## 2018-11-18 DIAGNOSIS — C3431 Malignant neoplasm of lower lobe, right bronchus or lung: Secondary | ICD-10-CM | POA: Diagnosis not present

## 2018-11-18 DIAGNOSIS — Z87891 Personal history of nicotine dependence: Secondary | ICD-10-CM | POA: Diagnosis not present

## 2018-11-18 DIAGNOSIS — Z510 Encounter for antineoplastic radiation therapy: Secondary | ICD-10-CM | POA: Diagnosis not present

## 2018-11-18 NOTE — Progress Notes (Signed)
  Radiation Oncology         [phone] ________________________________  Name: DINK CREPS MRN: [id number]  Date: 11/17/2018  DOB: 10-24-1950  SIMULATION AND TREATMENT PLANNING NOTE    ICD-10-CM   1. Primary cancer of right lower lobe of lung (HCC)  C34.31     DIAGNOSIS:  68 yo man with NSCLC of the right lower lung  NARRATIVE:  The patient was brought to the [facility].  Identity was confirmed.  All relevant records and images related to the planned course of therapy were reviewed.  The patient freely provided informed written consent to proceed with treatment after reviewing the details related to the planned course of therapy. The consent form was witnessed and verified by the simulation staff.  Then, the patient was set-up in a stable reproducible  supine position for radiation therapy.  CT images were obtained.  Surface markings were placed.  The CT images were loaded into the planning software.  Then the target and avoidance structures were contoured.  Treatment planning then occurred.  The radiation prescription was entered and confirmed.  Then, I designed and supervised the construction of a total of 6 medically necessary complex treatment devices, including a BodyFix immobilization mold custom fitted to the patient along with 5 multileaf collimators conformally shaped radiation around the treatment target while shielding critical structures such as the heart and spinal cord maximally.  I have requested : 3D Simulation  I have requested a DVH of the following structures: Left lung, right lung, spinal cord, heart, esophagus, and target.  I have ordered:Nutrition Consult  SPECIAL TREATMENT PROCEDURE:  The planned course of therapy using radiation constitutes a special treatment procedure. Special care is required in the management of this patient for the following reasons.  The patient will be receiving concurrent chemotherapy requiring careful monitoring for increased  toxicities of treatment including periodic laboratory values.  The special nature of the planned course of radiotherapy will require increased physician supervision and oversight to ensure patient's safety with optimal treatment outcomes.  PLAN:  The patient will receive 66 Gy in 33 fractions.  ________________________________  Sheral Apley Tammi Klippel, M.D.

## 2018-11-19 ENCOUNTER — Encounter: Payer: Self-pay | Admitting: Internal Medicine

## 2018-11-19 ENCOUNTER — Other Ambulatory Visit: Payer: Self-pay

## 2018-11-19 ENCOUNTER — Inpatient Hospital Stay: Payer: Medicare HMO | Admitting: Acute Care

## 2018-11-19 ENCOUNTER — Other Ambulatory Visit: Payer: Self-pay | Admitting: *Deleted

## 2018-11-19 ENCOUNTER — Ambulatory Visit
Admission: RE | Admit: 2018-11-19 | Discharge: 2018-11-19 | Disposition: A | Discharge status: Home / Self Care | Payer: Medicare HMO | Source: Ambulatory Visit | Attending: Radiation Oncology | Admitting: Radiation Oncology

## 2018-11-19 ENCOUNTER — Inpatient Hospital Stay: Payer: Medicare HMO | Attending: Internal Medicine | Admitting: Internal Medicine

## 2018-11-19 ENCOUNTER — Inpatient Hospital Stay: Payer: Medicare HMO

## 2018-11-19 DIAGNOSIS — F17210 Nicotine dependence, cigarettes, uncomplicated: Secondary | ICD-10-CM

## 2018-11-19 DIAGNOSIS — J449 Chronic obstructive pulmonary disease, unspecified: Secondary | ICD-10-CM

## 2018-11-19 DIAGNOSIS — C3431 Malignant neoplasm of lower lobe, right bronchus or lung: Secondary | ICD-10-CM | POA: Insufficient documentation

## 2018-11-19 DIAGNOSIS — Z7189 Other specified counseling: Secondary | ICD-10-CM | POA: Insufficient documentation

## 2018-11-19 DIAGNOSIS — C3490 Malignant neoplasm of unspecified part of unspecified bronchus or lung: Secondary | ICD-10-CM

## 2018-11-19 DIAGNOSIS — C3491 Malignant neoplasm of unspecified part of right bronchus or lung: Secondary | ICD-10-CM | POA: Insufficient documentation

## 2018-11-19 DIAGNOSIS — Z510 Encounter for antineoplastic radiation therapy: Secondary | ICD-10-CM | POA: Diagnosis not present

## 2018-11-19 DIAGNOSIS — I4891 Unspecified atrial fibrillation: Secondary | ICD-10-CM | POA: Diagnosis not present

## 2018-11-19 DIAGNOSIS — Z87891 Personal history of nicotine dependence: Secondary | ICD-10-CM | POA: Diagnosis not present

## 2018-11-19 DIAGNOSIS — I10 Essential (primary) hypertension: Secondary | ICD-10-CM | POA: Diagnosis not present

## 2018-11-19 DIAGNOSIS — R69 Illness, unspecified: Secondary | ICD-10-CM | POA: Diagnosis not present

## 2018-11-19 DIAGNOSIS — Z5111 Encounter for antineoplastic chemotherapy: Secondary | ICD-10-CM | POA: Diagnosis not present

## 2018-11-19 LAB — CBC WITH DIFFERENTIAL (CANCER CENTER ONLY)
Abs Immature Granulocytes: 0.01 10*3/uL (ref 0.00–0.07)
Basophils Absolute: 0.1 10*3/uL (ref 0.0–0.1)
Basophils Relative: 2 %
Eosinophils Absolute: 0.2 10*3/uL (ref 0.0–0.5)
Eosinophils Relative: 3 %
HCT: 39.4 % (ref 39.0–52.0)
Hemoglobin: 13.4 g/dL (ref 13.0–17.0)
Immature Granulocytes: 0 %
Lymphocytes Relative: 27 %
Lymphs Abs: 1.4 10*3/uL (ref 0.7–4.0)
MCH: 36 pg — ABNORMAL HIGH (ref 26.0–34.0)
MCHC: 34 g/dL (ref 30.0–36.0)
MCV: 105.9 fL — ABNORMAL HIGH (ref 80.0–100.0)
Monocytes Absolute: 0.6 10*3/uL (ref 0.1–1.0)
Monocytes Relative: 12 %
Neutro Abs: 2.9 10*3/uL (ref 1.7–7.7)
Neutrophils Relative %: 56 %
Platelet Count: 289 10*3/uL (ref 150–400)
RBC: 3.72 MIL/uL — ABNORMAL LOW (ref 4.22–5.81)
RDW: 14.3 % (ref 11.5–15.5)
WBC Count: 5.2 10*3/uL (ref 4.0–10.5)
nRBC: 0 % (ref 0.0–0.2)

## 2018-11-19 LAB — CMP (CANCER CENTER ONLY)
ALT: 12 U/L (ref 0–44)
AST: 16 U/L (ref 15–41)
Albumin: 3.5 g/dL (ref 3.5–5.0)
Alkaline Phosphatase: 83 U/L (ref 38–126)
Anion gap: 9 (ref 5–15)
BUN: 13 mg/dL (ref 8–23)
CO2: 24 mmol/L (ref 22–32)
Calcium: 9.3 mg/dL (ref 8.9–10.3)
Chloride: 104 mmol/L (ref 98–111)
Creatinine: 0.87 mg/dL (ref 0.61–1.24)
GFR, Est AFR Am: >60 mL/min (ref >60)
GFR, Estimated: >60 mL/min (ref >60)
Glucose, Bld: 104 mg/dL — ABNORMAL HIGH (ref 70–99)
Potassium: 4.5 mmol/L (ref 3.5–5.1)
Sodium: 137 mmol/L (ref 135–145)
Total Bilirubin: 0.9 mg/dL (ref 0.3–1.2)
Total Protein: 6.9 g/dL (ref 6.5–8.1)

## 2018-11-19 MED ORDER — PROCHLORPERAZINE MALEATE 10 MG PO TABS: 10.0000 mg | ORAL_TABLET | Freq: Four times a day (QID) | ORAL | 0 refills | Status: DC | PRN

## 2018-11-19 NOTE — Progress Notes (Signed)
START ON PATHWAY REGIMEN - Non-Small Cell Lung     Administer weekly:     Paclitaxel      Carboplatin   **Always confirm dose/schedule in your pharmacy ordering system**  Patient Characteristics: Stage III - Unresectable, PS = 0, 1 AJCC T Category: T3 Current Disease Status: No Distant Mets or Local Recurrence AJCC N Category: N1 AJCC M Category: M0 AJCC 8 Stage Grouping: IIIA ECOG Performance Status: 1 Intent of Therapy: Curative Intent, Discussed with Patient

## 2018-11-19 NOTE — Progress Notes (Signed)
[place] [phone]   [phone] Multidisciplinary thoracic oncology clinic  CONSULT NOTE  REFERRING PHYSICIAN: Dr. Tyler Cole  REASON FOR CONSULTATION:  68 years old white male recently diagnosed with lung cancer.  HPI Joe Cole is a 68 y.o. male with past medical history significant for osteoarthritis, hypertension, dyslipidemia, COPD, atrial fibrillation as well as long history of smoking but quit in May 2020.  The patient mentions that he has been complaining of shortness of breath for the last 5 months.  He was seen by his primary care physician Dr. Kenton Cole and referred to pulmonary medicine for evaluation.  Chest x-ray on 10/19/2018 showed consolidation and atelectasis in the right middle lobe with tiny right effusion.  This was followed by CT angiogram of the chest on 10/20/2018 and that showed no evidence for pulmonary emboli.  There was apparent mass arising from the right hilum with apparent tumor surrounding the proximal right middle and lower lobe bronchi.  There was airspace consolidation throughout the medial segment of the right middle lobe and to lesser extent patchy airspace consolidation in portions of the right lower lobe.  There was felt to be adenopathy in the inferior hilum on the right measuring 2.2 x 1.8 cm and adenopathy anterior to the carina as well.  There was also small right pleural effusion.  The patient had a PET scan on 10/29/2018 and that showed hypermetabolic mass at the right hila with postobstructive collapse of the right middle lobe.  The findings are most consistent with metastatic bronchogenic carcinoma.  There was potential hypermetabolic nodule within the collapsed right middle lobe and intense hypermetabolic activity in the right lower lobe favored to be postobstructive inflammation/infection.  There was no metastatic mediastinal adenopathy and there was a small right effusion.  On 11/09/2018 the patient  underwent bronchoscopy under the care of Joe Cole.  The final pathology ([id number]) of the right lower lobe biopsy was consistent with squamous cell carcinoma. The patient was seen by Dr. Tammi Cole and started radiotherapy earlier today.  He was referred to me today for evaluation and recommendation regarding concurrent chemotherapy. When seen today he continues to have cough productive of clear sputum as well as shortness of breath but no significant chest pain or hemoptysis.  He denied having any significant weight loss or night sweats.  The patient has no nausea, vomiting, diarrhea or constipation.  He denied having any headache or visual changes. Family history significant for mother with atrial fibrillation and father with COPD. The patient is married has 1 daughter.  He works in [employer].  He has a history for smoking 1.5 pack/day for around 47 years and quit in May 2020.  He drinks 1-2 beers every day no history of drug abuse.  He was accompanied by [name] on the phone during the visit.   HPI  Past Medical History:  Diagnosis Date   Arthritis    Atrial fibrillation ([place]) 10/2018   COPD with emphysema ([place])    Dyspnea    Hypertension     Past Surgical History:  Procedure Laterality Date   KNEE ARTHROSCOPY     BIL    TOE SURGERY     PINNED LEFT BIG TOE   TONSILLECTOMY     T+A  AS CHILD   TOTAL KNEE ARTHROPLASTY Left 08/19/2017   Procedure: TOTAL KNEE ARTHROPLASTY;  Surgeon: Joe Cole;  Location: [facility];  Service: Orthopedics;  Laterality: Left;   VIDEO BRONCHOSCOPY  Bilateral 11/05/2018   Procedure: VIDEO BRONCHOSCOPY WITH FLUORO;  Surgeon: Joe Cole;  Location: [facility];  Service: Cardiopulmonary;  Laterality: Bilateral;   VIDEO BRONCHOSCOPY Bilateral 11/09/2018   Procedure: VIDEO BRONCHOSCOPY WITH FLUORO;  Surgeon: Joe Cole;  Location: [facility];  Service: Cardiopulmonary;  Laterality: Bilateral;    Family History  Problem  Relation Age of Onset   Atrial fibrillation Mother    Emphysema Father    Pneumonia Father     Social History Social History   Tobacco Use   Smoking status: Former Smoker    Packs/day: 1.50    Years: 46.00    Pack years: 69.00    Types: Cigarettes    Quit date: 09/08/2018    Years since quitting: 0.1   Smokeless tobacco: Never Used  Substance Use Topics   Alcohol use: Yes    Alcohol/week: 19.0 standard drinks    Types: 7 Cans of beer, 12 Shots of liquor per week    Comment: FEW DRINKS AFTER WORK   Drug use: Never    No Known Allergies  Current Outpatient Medications  Medication Sig Dispense Refill   albuterol (PROAIR HFA) 108 (90 Base) MCG/ACT inhaler Inhale 2 puffs into the lungs every 6 (six) hours as needed for wheezing or shortness of breath. 1 Inhaler 5   apixaban (ELIQUIS) 5 MG TABS tablet Take 1 tablet (5 mg total) by mouth 2 (two) times daily. 60 tablet 3   atorvastatin (LIPITOR) 20 MG tablet Take 20 mg by mouth daily.     diltiazem (CARDIZEM CD) 360 MG 24 hr capsule Take 1 capsule (360 mg total) by mouth daily. 30 capsule 3   diphenhydramine-acetaminophen (TYLENOL PM) 25-500 MG TABS tablet Take 2 tablets by mouth at bedtime as needed (sleep).      docusate sodium (COLACE) 100 MG capsule Take 1 capsule (100 mg total) by mouth 2 (two) times daily. 10 capsule 0   ibuprofen (ADVIL) 200 MG tablet Take 400 mg by mouth every 6 (six) hours as needed for headache or moderate pain.     Probiotic Product (ALIGN) 4 MG CAPS Take 4 mg by mouth daily.     umeclidinium-vilanterol (ANORO ELLIPTA) 62.5-25 MCG/INH AEPB Inhale 1 puff into the lungs daily. 1 each 6   No current facility-administered medications for this visit.     Review of Systems  Constitutional: positive for fatigue Eyes: negative Ears, nose, mouth, throat, and face: negative Respiratory: positive for cough, dyspnea on exertion and sputum Cardiovascular: negative Gastrointestinal:  negative Genitourinary:negative Integument/breast: negative Hematologic/lymphatic: negative Musculoskeletal:negative Neurological: negative Behavioral/Psych: negative Endocrine: negative Allergic/Immunologic: negative  Physical Exam  FAO:ZHYQM, healthy, no distress, well nourished and well developed SKIN: skin color, texture, turgor are normal, no rashes or significant lesions HEAD: Normocephalic, No masses, lesions, tenderness or abnormalities EYES: normal, PERRLA, Conjunctiva are pink and non-injected EARS: External ears normal, Canals clear OROPHARYNX:no exudate, no erythema and lips, buccal mucosa, and tongue normal  NECK: supple, no adenopathy, no JVD LYMPH:  no palpable lymphadenopathy, no hepatosplenomegaly LUNGS: coarse sounds heard, decreased breath sounds HEART: regular rate & rhythm, no murmurs and no gallops ABDOMEN:abdomen soft, non-tender, normal bowel sounds and no masses or organomegaly BACK: No CVA tenderness, Range of motion is normal EXTREMITIES:no joint deformities, effusion, or inflammation, no edema  NEURO: alert & oriented x 3 with fluent speech, no focal motor/sensory deficits  PERFORMANCE STATUS: ECOG 1  LABORATORY DATA: Lab Results  Component Value Date   WBC 5.2 11/19/2018  HGB 13.4 11/19/2018   HCT 39.4 11/19/2018   MCV 105.9 (H) 11/19/2018   PLT 289 11/19/2018      Chemistry      Component Value Date/Time   NA 133 (L) 11/09/2018 1053   K 3.9 11/09/2018 1053   CL 101 11/09/2018 1053   CO2 24 11/09/2018 1053   BUN 9 11/09/2018 1053   CREATININE 0.81 11/09/2018 1053      Component Value Date/Time   CALCIUM 9.2 11/09/2018 1053   ALKPHOS 61 11/05/2018 1629   AST 27 11/05/2018 1629   ALT 14 11/05/2018 1629   BILITOT 1.6 (H) 11/05/2018 1629       RADIOGRAPHIC STUDIES: Nm Pet Image Initial (pi) Skull Base To Thigh  Result Date: 10/29/2018 CLINICAL DATA:  Initial treatment strategy for RIGHT lung mass. EXAM: NUCLEAR MEDICINE PET SKULL  BASE TO THIGH TECHNIQUE: 9.0 mCi F-18 FDG was injected intravenously. Full-ring PET imaging was performed from the skull base to thigh after the radiotracer. CT data was obtained and used for attenuation correction and anatomic localization. Fasting blood glucose: 92 mg/dl COMPARISON:  Chest CT October 20, 2018 FINDINGS: Mediastinal blood pool activity: SUV max 2.87 Liver activity: SUV max NA NECK: No hypermetabolic lymph nodes in the neck. Incidental CT findings: CHEST: Hypermetabolic mass at the RIGHT hilum measures 2.9 cm and is intensely hypermetabolic with SUV max equal 19.6. Mass obstructs the RIGHT middle lobe with complete collapse of the RIGHT middle lobe. Within the collapsed RIGHT middle lobe there is a focus of metabolic activity along the mediastinal/cardiac cardiac border with SUV max equal 4.8. This region activity measures approximately 1.5 cm but is not appreciated on CT portion exam. There is hypermetabolic activity associated with a ground-glass atelectasis in the periphery of the RIGHT lower lobe which is intense with SUV max equal 7.6 however there is no nodularity associated with this activity. Additional focus of hypermetabolic with thickening in the RIGHT lower lobe with SUV max equal 3.8 (image 52/8). No distinct hypermetabolic mediastinal lymph nodes. No LEFT lung lesion. Incidental CT findings: RIGHT pleural effusion. ABDOMEN/PELVIS: There is mild metabolic activity through the GE junction which is favored physiologic. No evidence of liver metastasis. Adrenal glands are normal. No hypermetabolic abdominopelvic lymph nodes. Incidental CT findings: Atherosclerotic calcification of the aorta. Diverticulosis of the sigmoid colon. SKELETON: No hypermetabolic lesions in the skeleton. Incidental CT findings: Severe degenerative changes of lumbar spine IMPRESSION: 1. Hypermetabolic mass at the RIGHT hila with postobstructive collapse of RIGHT middle lobe. Findings most consistent with METASTATIC  BRONCHOGENIC CARCINOMA. 2. Potential hypermetabolic nodule within the collapsed RIGHT middle lobe. 3. Intense metabolic activity in the RIGHT lower lobe is favored post obstructive inflammation/infection. 4. No metastatic mediastinal lymphadenopathy. 5. Small RIGHT effusion. These results will be called to the ordering clinician or representative by the Radiologist Assistant, and communication documented in the PACS or zVision Dashboard. Electronically Signed   By: Suzy Bouchard M.D.   On: 10/29/2018 09:56   Dg Chest Port 1 View  Result Date: 11/05/2018 CLINICAL DATA:  COPD.  Emphysema. EXAM: PORTABLE CHEST 1 VIEW COMPARISON:  CT 10/29/2018. Chest x-ray 10/19/2018. Chest x-ray 02/25/2018. FINDINGS: Mediastinum and hilar structures normal. Right infrahilar mass with atelectasis/infiltrate again noted. Stable cardiomegaly with prominent central pulmonary arteries suggesting pulmonary hypertension. IMPRESSION: 1. Right infrahilar mass with right base atelectasis/infiltrate again noted. Similar finding noted on prior exam. 2. Stable cardiomegaly with prominent central pulmonary arteries suggesting pulmonary hypertension. Electronically Signed   By: Marcello Moores  Register  On: 11/05/2018 16:36    ASSESSMENT: This is a very pleasant 68 years old white male recently diagnosed with a stage IIIa (T3, N1, M0) non-small cell lung cancer, squamous cell carcinoma diagnosed in July 2020 and presented with right middle lobe mass and right hilar adenopathy with postobstructive pneumonia and suspicious right pleural effusion.   PLAN: I had a lengthy discussion with the patient and his wife today about his current disease stage, prognosis and treatment options. I personally and independently reviewed the scan images and discussed the results with the patient and his wife today. I recommended for the patient to complete the staging work-up by ordering MRI of the brain to rule out brain metastasis. I recommended for the  patient a course of concurrent chemoradiation with weekly carboplatin for AUC of 2 and paclitaxel 45 mg/M2 followed by consolidation immunotherapy with Imfinzi if the patient has no evidence for disease progression after the induction phase. I discussed with the patient the adverse effect of the chemotherapy including but not limited to alopecia, myelosuppression, nausea and vomiting, peripheral neuropathy, liver or renal dysfunction. I will call his pharmacy with prescription for Compazine 10 mg p.o. every 6 hours as needed for nausea. I will arrange for the patient a chemotherapy education class before the first dose of his treatment. He is expected to start the first cycle of this treatment next week. We will see him back for follow-up visit in around 2 weeks for evaluation and management of any adverse effect of his treatment. For hypertension he was advised to take his blood pressure medication as prescribed and to monitor it closely at home. For smoking cessation, I strongly recommended for the patient to continue quitting smoking. The patient was advised to call immediately if he has any concerning symptoms in the interval.  The patient voices understanding of current disease status and treatment options and is in agreement with the current care plan.  All questions were answered. The patient knows to call the clinic with any problems, questions or concerns. We can certainly see the patient much sooner if necessary.  Thank you so much for allowing me to participate in the care of Joe Cole. I will continue to follow up the patient with you and assist in his care.  I spent 55 minutes counseling the patient face to face. The total time spent in the appointment was 80 minutes.  Disclaimer: This note was dictated with voice recognition software. Similar sounding words can inadvertently be transcribed and may not be corrected upon review.   Eilleen Kempf November 19, 2018, 3:44  PM

## 2018-11-19 NOTE — Progress Notes (Signed)
The proposed treatment discussed in cancer conference 11/19/2018 is for discussion purpose only and is not a binding recommendation.  The patient was not physically examined nor present for their treatment options.  Therefore, final treatment plans cannot be decided.

## 2018-11-20 ENCOUNTER — Telehealth: Payer: Self-pay | Admitting: Internal Medicine

## 2018-11-20 ENCOUNTER — Encounter: Payer: Self-pay | Admitting: *Deleted

## 2018-11-20 ENCOUNTER — Other Ambulatory Visit: Payer: Self-pay

## 2018-11-20 ENCOUNTER — Ambulatory Visit
Admission: RE | Admit: 2018-11-20 | Discharge: 2018-11-20 | Disposition: A | Discharge status: Home / Self Care | Payer: Medicare HMO | Source: Ambulatory Visit | Attending: Radiation Oncology | Admitting: Radiation Oncology

## 2018-11-20 DIAGNOSIS — C3431 Malignant neoplasm of lower lobe, right bronchus or lung: Secondary | ICD-10-CM | POA: Diagnosis not present

## 2018-11-20 DIAGNOSIS — Z87891 Personal history of nicotine dependence: Secondary | ICD-10-CM | POA: Diagnosis not present

## 2018-11-20 DIAGNOSIS — Z510 Encounter for antineoplastic radiation therapy: Secondary | ICD-10-CM | POA: Diagnosis not present

## 2018-11-20 DIAGNOSIS — C3491 Malignant neoplasm of unspecified part of right bronchus or lung: Secondary | ICD-10-CM

## 2018-11-20 MED ORDER — RADIAPLEXRX EX GEL
Freq: Two times a day (BID) | CUTANEOUS | Status: DC
  Administered 2018-11-20: 17:00:00 via TOPICAL

## 2018-11-20 NOTE — Telephone Encounter (Signed)
Scheduled appt per 7/23 los - start date tentative but all appts scheduled - wife is aware and pt will be called once first date to start is confirmed.

## 2018-11-20 NOTE — Progress Notes (Signed)
Oncology Nurse Navigator Documentation  Oncology Nurse Navigator Flowsheets 11/20/2018  Diagnosis Status Confirmed Diagnosis Complete  Planned Course of Treatment -  Navigator Follow Up Date: 11/30/2018  Navigator Follow Up Reason: -  Navigator Location [facility]  Referral Date to RadOnc/MedOnc -  Navigator Encounter Type Clinic/MDC/I spoke with patient and wife yesterday at thoracic clinic, wife was on the phone.  I help to explain lung cancer diagnosis and treatment plan.  Joe Cole received the lung cancer booklet.  [name] called today with questions about the visit yesterday.  I clarified.   Telephone -  Abnormal Finding Date 10/20/2018  Confirmed Diagnosis Date 11/09/2018  Multidisiplinary Clinic Date 11/19/2018  Treatment Initiated Date 11/17/2018  Patient Visit Type MedOnc  Treatment Phase Pre-Tx/Tx Discussion  Barriers/Navigation Needs Education  Education Newly Diagnosed Cancer Education;Other  Interventions Education  Coordination of Care -  Education Method Verbal;Written  Support Groups/Services Other  Acuity Level 2  Time Spent with Patient 65

## 2018-11-23 ENCOUNTER — Other Ambulatory Visit: Payer: Self-pay

## 2018-11-23 ENCOUNTER — Encounter: Payer: Self-pay | Admitting: *Deleted

## 2018-11-23 ENCOUNTER — Ambulatory Visit
Admission: RE | Admit: 2018-11-23 | Discharge: 2018-11-23 | Disposition: A | Discharge status: Home / Self Care | Payer: Medicare HMO | Source: Ambulatory Visit | Attending: Radiation Oncology | Admitting: Radiation Oncology

## 2018-11-23 ENCOUNTER — Other Ambulatory Visit: Payer: Self-pay | Admitting: Medical Oncology

## 2018-11-23 ENCOUNTER — Encounter: Payer: Self-pay | Admitting: Primary Care

## 2018-11-23 ENCOUNTER — Inpatient Hospital Stay: Payer: Medicare HMO

## 2018-11-23 ENCOUNTER — Inpatient Hospital Stay: Payer: Medicare HMO | Admitting: Acute Care

## 2018-11-23 ENCOUNTER — Ambulatory Visit: Payer: Medicare HMO | Admitting: Primary Care

## 2018-11-23 DIAGNOSIS — J449 Chronic obstructive pulmonary disease, unspecified: Secondary | ICD-10-CM

## 2018-11-23 DIAGNOSIS — M7989 Other specified soft tissue disorders: Secondary | ICD-10-CM | POA: Insufficient documentation

## 2018-11-23 DIAGNOSIS — C3431 Malignant neoplasm of lower lobe, right bronchus or lung: Secondary | ICD-10-CM | POA: Diagnosis not present

## 2018-11-23 DIAGNOSIS — C3491 Malignant neoplasm of unspecified part of right bronchus or lung: Secondary | ICD-10-CM

## 2018-11-23 DIAGNOSIS — G4700 Insomnia, unspecified: Secondary | ICD-10-CM | POA: Insufficient documentation

## 2018-11-23 DIAGNOSIS — Z510 Encounter for antineoplastic radiation therapy: Secondary | ICD-10-CM | POA: Diagnosis not present

## 2018-11-23 DIAGNOSIS — Z87891 Personal history of nicotine dependence: Secondary | ICD-10-CM | POA: Diagnosis not present

## 2018-11-23 DIAGNOSIS — C3490 Malignant neoplasm of unspecified part of unspecified bronchus or lung: Secondary | ICD-10-CM

## 2018-11-23 MED ORDER — TRAZODONE HCL 50 MG PO TABS: 50.0000 mg | ORAL_TABLET | Freq: Every day | ORAL | 2 refills | Status: DC

## 2018-11-23 NOTE — Progress Notes (Signed)
Oncology Nurse Navigator Documentation  Oncology Nurse Navigator Flowsheets 11/23/2018  Diagnosis Status -  Planned Course of Treatment -  Navigator Follow Up Date: -  Navigator Follow Up Reason: -  Navigator Location [facility]  Referral Date to RadOnc/MedOnc -  Navigator Encounter Type Other/I followed up on patients schedule.  Patient needs MRI Brain with authorization.  I contacted authorization coordinator for assistance.   Telephone -  Abnormal Finding Date -  Confirmed Diagnosis Date -  Multidisiplinary Clinic Date -  Treatment Initiated Date -  Patient Visit Type -  Treatment Phase Pre-Tx/Tx Discussion  Barriers/Navigation Needs Coordination of Care  Education -  Interventions Coordination of Care  Coordination of Care Other  Education Method -  Support Groups/Services -  Acuity Level 2  Time Spent with Patient 15

## 2018-11-23 NOTE — Patient Instructions (Signed)
Recommendations: Continue Anoro 1 puff daily Albuterol every 4-6 hours for breakthrough shortness of breath/wheezing  Wear compression stockings during the day  Discuss leg swelling with cardiology on Thursday (may consider ?diuretic)  Rx: Trazodone 25-50mg  at bedtime for insomnia  Follow-up: 2-3 months with Dr. Halford Chessman

## 2018-11-23 NOTE — Assessment & Plan Note (Signed)
-   Trial Trazodone 50mg  qhs  - Will call patient in 1 week to assess medication response

## 2018-11-23 NOTE — Assessment & Plan Note (Addendum)
-   Stable; O2 95% RA, lungs CTA  - Dyspnea on exertion, no SABA use  - Continue Anoro 1 puff daily - Albuterol hfa 2 puffs every 6 hours prn sob/wheezing

## 2018-11-23 NOTE — Assessment & Plan Note (Signed)
-   Irregular rhythm, rate controlled - Continue Eliquis 5mg  BID and diltiazem 360mg   - FU with cardiology 7/30

## 2018-11-23 NOTE — Progress Notes (Addendum)
@Patient  ID: Joe Cole, male    DOB: 1950/12/22, 68 y.o.   MRN: [id number]  Chief Complaint  Patient presents with   Hospitalization Follow-up    Patient reports that he still has sob with exertion, but is doing ok since his hospital stay.    Referring provider: [name], MD  HPI: 68 year old male, former smoker quit 09/08/18 (50+ pack year hx). PMH significant for COPD with emphysema. Patient of Dr. Halford Chessman, last seen on 10/26/18. CTA 10/20/18 showed right lower lobe mass. Patient had PET scan 10/29/18 which showed hypermetabolic mass right hila with postobstructive collapse RML. Findings consistent with metastatic bronchogenic carcinoma.   Hospital course 11/05/18-11/09/18: Patient scheduled for planned bronchoscopy for right lung mass PET positive, found to be in new afib/flutter. Admitted and cardiology consulted. Started on heparin and diltiazem gtt. Underwent bronchoscopy on 7/13 with Dr. Elsworth Soho. Pathology showed malignant cells consistent with squamous cell carcinoma. Referred to radiation oncology/ Dr. Tammi Klippel. Discharged on 7/13 on Eliquis and diltiazem for new afib.   11/23/2018 Patient presents today for hospital follow-up. Diagnosed with stage IIIa non-small cell lung cancer, squamous cell carcinoma diagnosed July 2020. Consulted in-patient with radiation oncology/ Dr. Tammi Klippel plan start radiotherapy. Seen by Dr. Julien Nordmann 7/23 with oncology for possible concurrent chemotherapy. Recommended patient course of concurrent chemoradiation weekly with carboplatin followed by consolidation immonuotherapy with imfinzi if patient has no evidence of disease progression. Plan start first cycle this week.   Patient is doing well considering, accompanied by his wife. Breathing is stable, experiences mild dyspnea on exertion. He hasn't been too active. Going to a lot of doctors appointments. Using Anoro 1 puff daily as prescribed. He has not required rescue inhaler use. Has not noticed much  difference since starting Anoro. He has had three radiation treatments (plan 6 weeks). Starting chemotherapy tomorrow 7/28. Needs MRI brain which has not yet been scheduled. Had an episode of rectal bleeding d/t constipation which has now resolved. No further bleeding. Appetite is ok but not as good as it used to be. Reports new swelling to feet and ankles for 4-5 days. He has an appointment with Cardiology in 3 days. Not sleeping well at night, reports getting two hours at night and wakes up frequently. Wife thinks it's d/t to anxiety. No snoring or apnea.    Significant testing:  PFT 05/04/18 >> FEV1 2.35 (64%), FEV1% 59, TLC 9.00 (121%), RV 4.89 (197%), DLCO 60%  A1AT level 05/04/18 >> 167, MM  10/19/2018-chest x-ray-consolidation atelectasis in right middle lobe with tiny right effusion this could represent pneumonia however possibility of an obstructing mass in the right middle lobe bronchus should be considered as well  10/20/2018-CTA chest- no PE, apparent mass arising from right hilum with apparent tumor surrounding the proximal right middle and lower lobe bronchi, there is airspace consolidation throughout the medial segment right middle lobe to a lesser extent patchy airspace consolidation of portions of the right lower lobe, there is felt to be adenopathy in the inferior hilum on the right measuring 2.2 x 1.8 cm  10/29/18 PET scan -hypermetabolic mass right hila with postobstructive collapse RML. Findings consistent with metastatic bronchogenic carcinoma.   11/09/18 Surgical pathology - malignant cells consistent with squamous cell carcinoma   No Known Allergies  Immunization History  Administered Date(s) Administered   Influenza, High Dose Seasonal PF 05/04/2018   Pneumococcal Polysaccharide-23 05/04/2018    Past Medical History:  Diagnosis Date   Arthritis    Atrial fibrillation ([place]) 10/2018  COPD with emphysema ([place])    Dyspnea    Hypertension     Tobacco  History: Social History   Tobacco Use  Smoking Status Former Smoker   Packs/day: 1.50   Years: 46.00   Pack years: 69.00   Types: Cigarettes   Quit date: 09/08/2018   Years since quitting: 0.2  Smokeless Tobacco Never Used   Counseling given: Not Answered   Outpatient Medications Prior to Visit  Medication Sig Dispense Refill   albuterol (PROAIR HFA) 108 (90 Base) MCG/ACT inhaler Inhale 2 puffs into the lungs every 6 (six) hours as needed for wheezing or shortness of breath. 1 Inhaler 5   apixaban (ELIQUIS) 5 MG TABS tablet Take 1 tablet (5 mg total) by mouth 2 (two) times daily. 60 tablet 3   atorvastatin (LIPITOR) 20 MG tablet Take 20 mg by mouth daily.     diltiazem (CARDIZEM CD) 360 MG 24 hr capsule Take 1 capsule (360 mg total) by mouth daily. 30 capsule 3   diphenhydramine-acetaminophen (TYLENOL PM) 25-500 MG TABS tablet Take 2 tablets by mouth at bedtime as needed (sleep).      docusate sodium (COLACE) 100 MG capsule Take 1 capsule (100 mg total) by mouth 2 (two) times daily. 10 capsule 0   Probiotic Product (ALIGN) 4 MG CAPS Take 4 mg by mouth daily.     prochlorperazine (COMPAZINE) 10 MG tablet Take 1 tablet (10 mg total) by mouth every 6 (six) hours as needed for nausea or vomiting. 30 tablet 0   umeclidinium-vilanterol (ANORO ELLIPTA) 62.5-25 MCG/INH AEPB Inhale 1 puff into the lungs daily. 1 each 6   ibuprofen (ADVIL) 200 MG tablet Take 400 mg by mouth every 6 (six) hours as needed for headache or moderate pain.     No facility-administered medications prior to visit.     Review of Systems  Review of Systems  Constitutional: Positive for appetite change. Negative for fever.  Respiratory: Negative for cough, shortness of breath and wheezing.   Cardiovascular: Positive for leg swelling.  Psychiatric/Behavioral: Positive for sleep disturbance.    Physical Exam  BP 116/68 (BP Location: Left Arm, Cuff Size: Normal)    Pulse 66    Temp 97.9 F (36.6 C)  (Oral)    Ht 6\' 1"  (1.854 m)    Wt 180 lb 14.4 oz (82.1 kg)    SpO2 94%    BMI 23.87 kg/m  Physical Exam Constitutional:      General: He is not in acute distress.    Appearance: Normal appearance.  HENT:     Head: Normocephalic and atraumatic.     Nose: Nose normal.  Cardiovascular:     Rate and Rhythm: Normal rate. Rhythm irregular.     Comments: +2-3 BLE edema Pulmonary:     Effort: Pulmonary effort is normal.     Breath sounds: Normal breath sounds.  Musculoskeletal: Normal range of motion.  Skin:    General: Skin is warm and dry.  Neurological:     General: No focal deficit present.     Mental Status: He is alert and oriented to person, place, and time. Mental status is at baseline.  Psychiatric:        Mood and Affect: Mood normal.        Behavior: Behavior normal.        Thought Content: Thought content normal.        Judgment: Judgment normal.      Lab Results:  CBC  Component Value Date/Time   WBC 5.2 11/19/2018 1527   WBC 5.3 11/09/2018 0556   RBC 3.72 (L) 11/19/2018 1527   HGB 13.4 11/19/2018 1527   HCT 39.4 11/19/2018 1527   PLT 289 11/19/2018 1527   MCV 105.9 (H) 11/19/2018 1527   MCH 36.0 (H) 11/19/2018 1527   MCHC 34.0 11/19/2018 1527   RDW 14.3 11/19/2018 1527   LYMPHSABS 1.4 11/19/2018 1527   MONOABS 0.6 11/19/2018 1527   EOSABS 0.2 11/19/2018 1527   BASOSABS 0.1 11/19/2018 1527    BMET    Component Value Date/Time   NA 137 11/19/2018 1527   K 4.5 11/19/2018 1527   CL 104 11/19/2018 1527   CO2 24 11/19/2018 1527   GLUCOSE 104 (H) 11/19/2018 1527   BUN 13 11/19/2018 1527   CREATININE 0.87 11/19/2018 1527   CALCIUM 9.3 11/19/2018 1527   GFRNONAA >60 11/19/2018 1527   GFRAA >60 11/19/2018 1527    BNP No results found for: BNP  ProBNP No results found for: PROBNP  Imaging: Nm Pet Image Initial (pi) Skull Base To Thigh  Result Date: 10/29/2018 CLINICAL DATA:  Initial treatment strategy for RIGHT lung mass. EXAM: NUCLEAR MEDICINE  PET SKULL BASE TO THIGH TECHNIQUE: 9.0 mCi F-18 FDG was injected intravenously. Full-ring PET imaging was performed from the skull base to thigh after the radiotracer. CT data was obtained and used for attenuation correction and anatomic localization. Fasting blood glucose: 92 mg/dl COMPARISON:  Chest CT October 20, 2018 FINDINGS: Mediastinal blood pool activity: SUV max 2.87 Liver activity: SUV max NA NECK: No hypermetabolic lymph nodes in the neck. Incidental CT findings: CHEST: Hypermetabolic mass at the RIGHT hilum measures 2.9 cm and is intensely hypermetabolic with SUV max equal 19.6. Mass obstructs the RIGHT middle lobe with complete collapse of the RIGHT middle lobe. Within the collapsed RIGHT middle lobe there is a focus of metabolic activity along the mediastinal/cardiac cardiac border with SUV max equal 4.8. This region activity measures approximately 1.5 cm but is not appreciated on CT portion exam. There is hypermetabolic activity associated with a ground-glass atelectasis in the periphery of the RIGHT lower lobe which is intense with SUV max equal 7.6 however there is no nodularity associated with this activity. Additional focus of hypermetabolic with thickening in the RIGHT lower lobe with SUV max equal 3.8 (image 52/8). No distinct hypermetabolic mediastinal lymph nodes. No LEFT lung lesion. Incidental CT findings: RIGHT pleural effusion. ABDOMEN/PELVIS: There is mild metabolic activity through the GE junction which is favored physiologic. No evidence of liver metastasis. Adrenal glands are normal. No hypermetabolic abdominopelvic lymph nodes. Incidental CT findings: Atherosclerotic calcification of the aorta. Diverticulosis of the sigmoid colon. SKELETON: No hypermetabolic lesions in the skeleton. Incidental CT findings: Severe degenerative changes of lumbar spine IMPRESSION: 1. Hypermetabolic mass at the RIGHT hila with postobstructive collapse of RIGHT middle lobe. Findings most consistent with  METASTATIC BRONCHOGENIC CARCINOMA. 2. Potential hypermetabolic nodule within the collapsed RIGHT middle lobe. 3. Intense metabolic activity in the RIGHT lower lobe is favored post obstructive inflammation/infection. 4. No metastatic mediastinal lymphadenopathy. 5. Small RIGHT effusion. These results will be called to the ordering clinician or representative by the Radiologist Assistant, and communication documented in the PACS or zVision Dashboard. Electronically Signed   By: Suzy Bouchard M.D.   On: 10/29/2018 09:56   Dg Chest Port 1 View  Result Date: 11/05/2018 CLINICAL DATA:  COPD.  Emphysema. EXAM: PORTABLE CHEST 1 VIEW COMPARISON:  CT 10/29/2018. Chest x-ray 10/19/2018.  Chest x-ray 02/25/2018. FINDINGS: Mediastinum and hilar structures normal. Right infrahilar mass with atelectasis/infiltrate again noted. Stable cardiomegaly with prominent central pulmonary arteries suggesting pulmonary hypertension. IMPRESSION: 1. Right infrahilar mass with right base atelectasis/infiltrate again noted. Similar finding noted on prior exam. 2. Stable cardiomegaly with prominent central pulmonary arteries suggesting pulmonary hypertension. Electronically Signed   By: Marcello Moores  Register   On: 11/05/2018 16:36     Assessment & Plan:   Stage III squamous cell carcinoma of right lung (HCC) - Stage IIIa non-small cell lung cancer, squamous cell carcinoma diagnosed July 2020 - Receiving radiation therapy with concurrent chemotherapy  - Following with oncology (Dr. Julien Nordmann) and radiation oncology (Dr. Tammi Klippel)  COPD mixed type [facility]) - Stable; O2 95% RA, lungs CTA  - Dyspnea on exertion, no SABA use  - Continue Anoro 1 puff daily - Albuterol hfa 2 puffs every 6 hours prn sob/wheezing    Atrial fibrillation (HCC) - Irregular rhythm, rate controlled - Continue Eliquis 5mg  BID and diltiazem 360mg   - FU with cardiology 7/30  Leg swelling - New; reports leg swelling x 4-5 days  - Advised compression stockings  and leg elevation - Has apt with cardiology this week, consideration for diuretic if appropriate   Insomnia - Trial Trazodone 50mg  qhs  - Will call patient in 1 week to assess medication response    Martyn Ehrich, NP 11/23/2018

## 2018-11-23 NOTE — Assessment & Plan Note (Addendum)
-   New; reports leg swelling x 4-5 days  - Advised compression stockings and leg elevation - Has apt with cardiology this week, consideration for diuretic if appropriate

## 2018-11-23 NOTE — Assessment & Plan Note (Addendum)
-   Stage IIIa non-small cell lung cancer, squamous cell carcinoma diagnosed July 2020 - Receiving radiation therapy with concurrent chemotherapy  - Following with oncology (Dr. Julien Nordmann) and radiation oncology (Dr. Tammi Klippel)

## 2018-11-24 ENCOUNTER — Other Ambulatory Visit: Payer: Self-pay

## 2018-11-24 ENCOUNTER — Inpatient Hospital Stay: Payer: Medicare HMO

## 2018-11-24 ENCOUNTER — Ambulatory Visit
Admission: RE | Admit: 2018-11-24 | Discharge: 2018-11-24 | Disposition: A | Discharge status: Home / Self Care | Payer: Medicare HMO | Source: Ambulatory Visit | Attending: Radiation Oncology | Admitting: Radiation Oncology

## 2018-11-24 VITALS — BP 136/87 | HR 50 | Temp 98.0°F | Resp 17

## 2018-11-24 DIAGNOSIS — C3490 Malignant neoplasm of unspecified part of unspecified bronchus or lung: Secondary | ICD-10-CM

## 2018-11-24 DIAGNOSIS — Z87891 Personal history of nicotine dependence: Secondary | ICD-10-CM | POA: Diagnosis not present

## 2018-11-24 DIAGNOSIS — Z510 Encounter for antineoplastic radiation therapy: Secondary | ICD-10-CM | POA: Diagnosis not present

## 2018-11-24 DIAGNOSIS — C3431 Malignant neoplasm of lower lobe, right bronchus or lung: Secondary | ICD-10-CM | POA: Diagnosis not present

## 2018-11-24 DIAGNOSIS — Z5111 Encounter for antineoplastic chemotherapy: Secondary | ICD-10-CM | POA: Diagnosis not present

## 2018-11-24 LAB — CBC WITH DIFFERENTIAL (CANCER CENTER ONLY)
Abs Immature Granulocytes: 0.02 10*3/uL (ref 0.00–0.07)
Basophils Absolute: 0.1 10*3/uL (ref 0.0–0.1)
Basophils Relative: 1 %
Eosinophils Absolute: 0.2 10*3/uL (ref 0.0–0.5)
Eosinophils Relative: 4 %
HCT: 39.2 % (ref 39.0–52.0)
Hemoglobin: 13.5 g/dL (ref 13.0–17.0)
Immature Granulocytes: 0 %
Lymphocytes Relative: 25 %
Lymphs Abs: 1.5 10*3/uL (ref 0.7–4.0)
MCH: 37 pg — ABNORMAL HIGH (ref 26.0–34.0)
MCHC: 34.4 g/dL (ref 30.0–36.0)
MCV: 107.4 fL — ABNORMAL HIGH (ref 80.0–100.0)
Monocytes Absolute: 0.7 10*3/uL (ref 0.1–1.0)
Monocytes Relative: 12 %
Neutro Abs: 3.6 10*3/uL (ref 1.7–7.7)
Neutrophils Relative %: 58 %
Platelet Count: 269 10*3/uL (ref 150–400)
RBC: 3.65 MIL/uL — ABNORMAL LOW (ref 4.22–5.81)
RDW: 14.2 % (ref 11.5–15.5)
WBC Count: 6.1 10*3/uL (ref 4.0–10.5)
nRBC: 0 % (ref 0.0–0.2)

## 2018-11-24 LAB — CMP (CANCER CENTER ONLY)
ALT: 12 U/L (ref 0–44)
AST: 16 U/L (ref 15–41)
Albumin: 3.3 g/dL — ABNORMAL LOW (ref 3.5–5.0)
Alkaline Phosphatase: 82 U/L (ref 38–126)
Anion gap: 7 (ref 5–15)
BUN: 14 mg/dL (ref 8–23)
CO2: 24 mmol/L (ref 22–32)
Calcium: 9.4 mg/dL (ref 8.9–10.3)
Chloride: 105 mmol/L (ref 98–111)
Creatinine: 0.84 mg/dL (ref 0.61–1.24)
GFR, Est AFR Am: >60 mL/min (ref >60)
GFR, Estimated: >60 mL/min (ref >60)
Glucose, Bld: 92 mg/dL (ref 70–99)
Potassium: 4.3 mmol/L (ref 3.5–5.1)
Sodium: 136 mmol/L (ref 135–145)
Total Bilirubin: 0.9 mg/dL (ref 0.3–1.2)
Total Protein: 6.8 g/dL (ref 6.5–8.1)

## 2018-11-24 MED ORDER — SODIUM CHLORIDE 0.9 % IV SOLN
Freq: Once | INTRAVENOUS | Status: AC
  Administered 2018-11-24: 13:00:00 via INTRAVENOUS
  Filled 2018-11-24: qty 250

## 2018-11-24 MED ORDER — SODIUM CHLORIDE 0.9 % IV SOLN
20.0000 mg | Freq: Once | INTRAVENOUS | Status: AC
  Administered 2018-11-24: 20 mg via INTRAVENOUS
  Filled 2018-11-24: qty 2

## 2018-11-24 MED ORDER — DIPHENHYDRAMINE HCL 50 MG/ML IJ SOLN
INTRAMUSCULAR | Status: AC
  Filled 2018-11-24: qty 1

## 2018-11-24 MED ORDER — FAMOTIDINE IN NACL 20-0.9 MG/50ML-% IV SOLN
20.0000 mg | Freq: Once | INTRAVENOUS | Status: AC
  Administered 2018-11-24: 20 mg via INTRAVENOUS

## 2018-11-24 MED ORDER — FAMOTIDINE IN NACL 20-0.9 MG/50ML-% IV SOLN
INTRAVENOUS | Status: AC
  Filled 2018-11-24: qty 50

## 2018-11-24 MED ORDER — PALONOSETRON HCL INJECTION 0.25 MG/5ML
INTRAVENOUS | Status: AC
  Filled 2018-11-24: qty 5

## 2018-11-24 MED ORDER — PALONOSETRON HCL INJECTION 0.25 MG/5ML
0.2500 mg | Freq: Once | INTRAVENOUS | Status: AC
  Administered 2018-11-24: 0.25 mg via INTRAVENOUS

## 2018-11-24 MED ORDER — SODIUM CHLORIDE 0.9 % IV SOLN
45.0000 mg/m2 | Freq: Once | INTRAVENOUS | Status: AC
  Administered 2018-11-24: 90 mg via INTRAVENOUS
  Filled 2018-11-24: qty 15

## 2018-11-24 MED ORDER — DIPHENHYDRAMINE HCL 50 MG/ML IJ SOLN
50.0000 mg | Freq: Once | INTRAMUSCULAR | Status: AC
  Administered 2018-11-24: 50 mg via INTRAVENOUS

## 2018-11-24 MED ORDER — SODIUM CHLORIDE 0.9 % IV SOLN
214.8000 mg | Freq: Once | INTRAVENOUS | Status: AC
  Administered 2018-11-24: 210 mg via INTRAVENOUS
  Filled 2018-11-24: qty 21

## 2018-11-24 NOTE — Patient Instructions (Signed)
[facility] Discharge Instructions for Patients Receiving Chemotherapy  Today you received the following chemotherapy agents: Taxol, Carboplatin  To help prevent nausea and vomiting after your treatment, we encourage you to take your nausea medication as directed.   If you develop nausea and vomiting that is not controlled by your nausea medication, call the clinic.   BELOW ARE SYMPTOMS THAT SHOULD BE REPORTED IMMEDIATELY:  *FEVER GREATER THAN 100.5 F  *CHILLS WITH OR WITHOUT FEVER  NAUSEA AND VOMITING THAT IS NOT CONTROLLED WITH YOUR NAUSEA MEDICATION  *UNUSUAL SHORTNESS OF BREATH  *UNUSUAL BRUISING OR BLEEDING  TENDERNESS IN MOUTH AND THROAT WITH OR WITHOUT PRESENCE OF ULCERS  *URINARY PROBLEMS  *BOWEL PROBLEMS  UNUSUAL RASH Items with * indicate a potential emergency and should be followed up as soon as possible.  Feel free to call the clinic should you have any questions or concerns. The clinic phone number is [phone].  Please show the [facility] at check-in to the Emergency Department and triage nurse.  Paclitaxel injection What is this medicine? PACLITAXEL (PAK li TAX el) is a chemotherapy drug. It targets fast dividing cells, like cancer cells, and causes these cells to die. This medicine is used to treat ovarian cancer, breast cancer, lung cancer, Kaposi's sarcoma, and other cancers. This medicine may be used for other purposes; ask your health care provider or pharmacist if you have questions. COMMON BRAND NAME(S): Onxol, Taxol What should I tell my health care provider before I take this medicine? They need to know if you have any of these conditions:  history of irregular heartbeat  liver disease  low blood counts, like low white cell, platelet, or red cell counts  lung or breathing disease, like asthma  tingling of the fingers or toes, or other nerve disorder  an unusual or allergic reaction to paclitaxel, alcohol,  polyoxyethylated castor oil, other chemotherapy, other medicines, foods, dyes, or preservatives  pregnant or trying to get pregnant  breast-feeding How should I use this medicine? This drug is given as an infusion into a vein. It is administered in a hospital or clinic by a specially trained health care professional. Talk to your pediatrician regarding the use of this medicine in children. Special care may be needed. Overdosage: If you think you have taken too much of this medicine contact a poison control center or emergency room at once. NOTE: This medicine is only for you. Do not share this medicine with others. What if I miss a dose? It is important not to miss your dose. Call your doctor or health care professional if you are unable to keep an appointment. What may interact with this medicine? Do not take this medicine with any of the following medications:  disulfiram  metronidazole This medicine may also interact with the following medications:  antiviral medicines for hepatitis, HIV or AIDS  certain antibiotics like erythromycin and clarithromycin  certain medicines for fungal infections like ketoconazole and itraconazole  certain medicines for seizures like carbamazepine, phenobarbital, phenytoin  gemfibrozil  nefazodone  rifampin  St. John's wort This list may not describe all possible interactions. Give your health care provider a list of all the medicines, herbs, non-prescription drugs, or dietary supplements you use. Also tell them if you smoke, drink alcohol, or use illegal drugs. Some items may interact with your medicine. What should I watch for while using this medicine? Your condition will be monitored carefully while you are receiving this medicine. You will need important blood  work done while you are taking this medicine. This medicine can cause serious allergic reactions. To reduce your risk you will need to take other medicine(s) before treatment with this  medicine. If you experience allergic reactions like skin rash, itching or hives, swelling of the face, lips, or tongue, tell your doctor or health care professional right away. In some cases, you may be given additional medicines to help with side effects. Follow all directions for their use. This drug may make you feel generally unwell. This is not uncommon, as chemotherapy can affect healthy cells as well as cancer cells. Report any side effects. Continue your course of treatment even though you feel ill unless your doctor tells you to stop. Call your doctor or health care professional for advice if you get a fever, chills or sore throat, or other symptoms of a cold or flu. Do not treat yourself. This drug decreases your body's ability to fight infections. Try to avoid being around people who are sick. This medicine may increase your risk to bruise or bleed. Call your doctor or health care professional if you notice any unusual bleeding. Be careful brushing and flossing your teeth or using a toothpick because you may get an infection or bleed more easily. If you have any dental work done, tell your dentist you are receiving this medicine. Avoid taking products that contain aspirin, acetaminophen, ibuprofen, naproxen, or ketoprofen unless instructed by your doctor. These medicines may hide a fever. Do not become pregnant while taking this medicine. Women should inform their doctor if they wish to become pregnant or think they might be pregnant. There is a potential for serious side effects to an unborn child. Talk to your health care professional or pharmacist for more information. Do not breast-feed an infant while taking this medicine. Men are advised not to father a child while receiving this medicine. This product may contain alcohol. Ask your pharmacist or healthcare provider if this medicine contains alcohol. Be sure to tell all healthcare providers you are taking this medicine. Certain medicines,  like metronidazole and disulfiram, can cause an unpleasant reaction when taken with alcohol. The reaction includes flushing, headache, nausea, vomiting, sweating, and increased thirst. The reaction can last from 30 minutes to several hours. What side effects may I notice from receiving this medicine? Side effects that you should report to your doctor or health care professional as soon as possible:  allergic reactions like skin rash, itching or hives, swelling of the face, lips, or tongue  breathing problems  changes in vision  fast, irregular heartbeat  high or low blood pressure  mouth sores  pain, tingling, numbness in the hands or feet  signs of decreased platelets or bleeding - bruising, pinpoint red spots on the skin, black, tarry stools, blood in the urine  signs of decreased red blood cells - unusually weak or tired, feeling faint or lightheaded, falls  signs of infection - fever or chills, cough, sore throat, pain or difficulty passing urine  signs and symptoms of liver injury like dark yellow or brown urine; general ill feeling or flu-like symptoms; light-colored stools; loss of appetite; nausea; right upper belly pain; unusually weak or tired; yellowing of the eyes or skin  swelling of the ankles, feet, hands  unusually slow heartbeat Side effects that usually do not require medical attention (report to your doctor or health care professional if they continue or are bothersome):  diarrhea  hair loss  loss of appetite  muscle or joint pain  nausea, vomiting  pain, redness, or irritation at site where injected  tiredness This list may not describe all possible side effects. Call your doctor for medical advice about side effects. You may report side effects to FDA at 1-800-FDA-1088. Where should I keep my medicine? This drug is given in a hospital or clinic and will not be stored at home. NOTE: This sheet is a summary. It may not cover all possible information.  If you have questions about this medicine, talk to your doctor, pharmacist, or health care provider.  2020 Elsevier/Gold Standard (2016-12-17 13:14:55)  Carboplatin injection What is this medicine? CARBOPLATIN (KAR boe pla tin) is a chemotherapy drug. It targets fast dividing cells, like cancer cells, and causes these cells to die. This medicine is used to treat ovarian cancer and many other cancers. This medicine may be used for other purposes; ask your health care provider or pharmacist if you have questions. COMMON BRAND NAME(S): Paraplatin What should I tell my health care provider before I take this medicine? They need to know if you have any of these conditions:  blood disorders  hearing problems  kidney disease  recent or ongoing radiation therapy  an unusual or allergic reaction to carboplatin, cisplatin, other chemotherapy, other medicines, foods, dyes, or preservatives  pregnant or trying to get pregnant  breast-feeding How should I use this medicine? This drug is usually given as an infusion into a vein. It is administered in a hospital or clinic by a specially trained health care professional. Talk to your pediatrician regarding the use of this medicine in children. Special care may be needed. Overdosage: If you think you have taken too much of this medicine contact a poison control center or emergency room at once. NOTE: This medicine is only for you. Do not share this medicine with others. What if I miss a dose? It is important not to miss a dose. Call your doctor or health care professional if you are unable to keep an appointment. What may interact with this medicine?  medicines for seizures  medicines to increase blood counts like filgrastim, pegfilgrastim, sargramostim  some antibiotics like amikacin, gentamicin, neomycin, streptomycin, tobramycin  vaccines Talk to your doctor or health care professional before taking any of these  medicines:  acetaminophen  aspirin  ibuprofen  ketoprofen  naproxen This list may not describe all possible interactions. Give your health care provider a list of all the medicines, herbs, non-prescription drugs, or dietary supplements you use. Also tell them if you smoke, drink alcohol, or use illegal drugs. Some items may interact with your medicine. What should I watch for while using this medicine? Your condition will be monitored carefully while you are receiving this medicine. You will need important blood work done while you are taking this medicine. This drug may make you feel generally unwell. This is not uncommon, as chemotherapy can affect healthy cells as well as cancer cells. Report any side effects. Continue your course of treatment even though you feel ill unless your doctor tells you to stop. In some cases, you may be given additional medicines to help with side effects. Follow all directions for their use. Call your doctor or health care professional for advice if you get a fever, chills or sore throat, or other symptoms of a cold or flu. Do not treat yourself. This drug decreases your body's ability to fight infections. Try to avoid being around people who are sick. This medicine may increase your risk to bruise or bleed.  Call your doctor or health care professional if you notice any unusual bleeding. Be careful brushing and flossing your teeth or using a toothpick because you may get an infection or bleed more easily. If you have any dental work done, tell your dentist you are receiving this medicine. Avoid taking products that contain aspirin, acetaminophen, ibuprofen, naproxen, or ketoprofen unless instructed by your doctor. These medicines may hide a fever. Do not become pregnant while taking this medicine. Women should inform their doctor if they wish to become pregnant or think they might be pregnant. There is a potential for serious side effects to an unborn child. Talk  to your health care professional or pharmacist for more information. Do not breast-feed an infant while taking this medicine. What side effects may I notice from receiving this medicine? Side effects that you should report to your doctor or health care professional as soon as possible:  allergic reactions like skin rash, itching or hives, swelling of the face, lips, or tongue  signs of infection - fever or chills, cough, sore throat, pain or difficulty passing urine  signs of decreased platelets or bleeding - bruising, pinpoint red spots on the skin, black, tarry stools, nosebleeds  signs of decreased red blood cells - unusually weak or tired, fainting spells, lightheadedness  breathing problems  changes in hearing  changes in vision  chest pain  high blood pressure  low blood counts - This drug may decrease the number of white blood cells, red blood cells and platelets. You may be at increased risk for infections and bleeding.  nausea and vomiting  pain, swelling, redness or irritation at the injection site  pain, tingling, numbness in the hands or feet  problems with balance, talking, walking  trouble passing urine or change in the amount of urine Side effects that usually do not require medical attention (report to your doctor or health care professional if they continue or are bothersome):  hair loss  loss of appetite  metallic taste in the mouth or changes in taste This list may not describe all possible side effects. Call your doctor for medical advice about side effects. You may report side effects to FDA at 1-800-FDA-1088. Where should I keep my medicine? This drug is given in a hospital or clinic and will not be stored at home. NOTE: This sheet is a summary. It may not cover all possible information. If you have questions about this medicine, talk to your doctor, pharmacist, or health care provider.  2020 Elsevier/Gold Standard (2007-07-21 14:38:05)

## 2018-11-25 ENCOUNTER — Telehealth: Payer: Self-pay | Admitting: *Deleted

## 2018-11-25 ENCOUNTER — Other Ambulatory Visit: Payer: Self-pay

## 2018-11-25 ENCOUNTER — Telehealth: Payer: Self-pay

## 2018-11-25 ENCOUNTER — Ambulatory Visit
Admission: RE | Admit: 2018-11-25 | Discharge: 2018-11-25 | Disposition: A | Discharge status: Home / Self Care | Payer: Medicare HMO | Source: Ambulatory Visit | Attending: Radiation Oncology | Admitting: Radiation Oncology

## 2018-11-25 DIAGNOSIS — Z510 Encounter for antineoplastic radiation therapy: Secondary | ICD-10-CM | POA: Diagnosis not present

## 2018-11-25 DIAGNOSIS — C3431 Malignant neoplasm of lower lobe, right bronchus or lung: Secondary | ICD-10-CM | POA: Diagnosis not present

## 2018-11-25 DIAGNOSIS — Z87891 Personal history of nicotine dependence: Secondary | ICD-10-CM | POA: Diagnosis not present

## 2018-11-25 NOTE — Telephone Encounter (Signed)

## 2018-11-25 NOTE — Telephone Encounter (Signed)
Called Joe Cole for chemotherapy F/U.  Patient is doing well.  Denies n/v.  Denies any new side effects or symptoms.  Bowel and bladder functioning well.  Eating and drinking well.  Instructed to drink 64 oz minimum daily or at least the day before, of and after treatment.   Spouse [name] asked when nausea begins.  Side effects individualized.  Confirms nausea medicine in the home.  Plan to pick up Trazadone ordered by pulmonary for trouble sleeping later today.  Denies further questions or needs at this time.  Encouraged to call [phone] Mon -Fri 8:00 am - 4:30 pm or anytime as needed for symptoms, changes or event outside office hours.

## 2018-11-25 NOTE — Progress Notes (Signed)
Cardiology Office Note   Date:  11/26/2018   ID:  Joe Cole, Joe Cole Nov 02, 1950, MRN [id number]  PCP:  [name], MD  Cardiologist:  Dr. Wynonia Lawman Dr. Marlou Porch     Chief Complaint  Patient presents with   Atrial Fibrillation   Hospitalization Follow-up      History of Present Illness: Joe Cole is a 68 y.o. male who presents for post hospital for a fib.  He was dx for lung cancer and Rad/onc planned for impending obstruction.  Rt lung mass. When presented for bronchoscopy found to be in a fib. Mass discovered on eval for COPD exacerbation.  PET scan showed SUV 19.6 in Rt hilar region with obstruction of RML, and 7.6 SUV RLL.  Biopsy with squamous cell carcinoma.  Plan for concurrent chemoradiation.   Other hx of HTN, HLD and tobacco until 2.5 months ago. Marland Kitchen  HR originally in 140s placed on IV dilt and heparin.  Pt asymptomatic with a fib.  CHA2DS2VASc of 2.  dilt po at 360.   Last echo EF 50-55%, poor windows.  RV EF was mildly depressed, Lt and Rt atrium severely dilated.   Today his HR is 138, pt unaware of tachycardia.  No chest pain.  SOB is stable to mildly elevated.  Mild lower ext edema and BP is mildly elevated - his lisinopril hctz were stopped.  His first chemo he did well and is for 6 th radiation therapy today.  He and his wife have been overwhelmed with cancer DX.  They are doing well, but frustration with not being able to be with him.    Past Medical History:  Diagnosis Date   Arthritis    Atrial fibrillation ([facility]) 10/2018   COPD with emphysema ([facility])    Dyspnea    Hypertension     Past Surgical History:  Procedure Laterality Date   KNEE ARTHROSCOPY     BIL    TOE SURGERY     PINNED LEFT BIG TOE   TONSILLECTOMY     T+A  AS CHILD   TOTAL KNEE ARTHROPLASTY Left 08/19/2017   Procedure: TOTAL KNEE ARTHROPLASTY;  Surgeon: Renette Butters, MD;  Location: [facility];  Service: Orthopedics;  Laterality: Left;   VIDEO BRONCHOSCOPY Bilateral  11/05/2018   Procedure: VIDEO BRONCHOSCOPY WITH FLUORO;  Surgeon: [name], MD;  Location: [facility];  Service: Cardiopulmonary;  Laterality: Bilateral;   VIDEO BRONCHOSCOPY Bilateral 11/09/2018   Procedure: VIDEO BRONCHOSCOPY WITH FLUORO;  Surgeon: Rigoberto Noel, MD;  Location: [facility];  Service: Cardiopulmonary;  Laterality: Bilateral;     Current Outpatient Medications  Medication Sig Dispense Refill   albuterol (PROAIR HFA) 108 (90 Base) MCG/ACT inhaler Inhale 2 puffs into the lungs every 6 (six) hours as needed for wheezing or shortness of breath. 1 Inhaler 5   apixaban (ELIQUIS) 5 MG TABS tablet Take 1 tablet (5 mg total) by mouth 2 (two) times daily. 60 tablet 3   atorvastatin (LIPITOR) 20 MG tablet Take 20 mg by mouth daily.     diltiazem (CARDIZEM CD) 360 MG 24 hr capsule Take 1 capsule (360 mg total) by mouth daily. 30 capsule 3   diphenhydramine-acetaminophen (TYLENOL PM) 25-500 MG TABS tablet Take 2 tablets by mouth at bedtime as needed (sleep).      docusate sodium (COLACE) 100 MG capsule Take 1 capsule (100 mg total) by mouth 2 (two) times daily. 10 capsule 0   Probiotic Product (ALIGN) 4 MG CAPS Take 4  mg by mouth daily.     prochlorperazine (COMPAZINE) 10 MG tablet Take 1 tablet (10 mg total) by mouth every 6 (six) hours as needed for nausea or vomiting. 30 tablet 0   traZODone (DESYREL) 50 MG tablet Take 1 tablet (50 mg total) by mouth at bedtime. 30 tablet 2   umeclidinium-vilanterol (ANORO ELLIPTA) 62.5-25 MCG/INH AEPB Inhale 1 puff into the lungs daily. 1 each 6   No current facility-administered medications for this visit.     Allergies:   Patient has no known allergies.    Social History:  The patient  reports that he quit smoking about 2 months ago. His smoking use included cigarettes. He has a 69.00 pack-year smoking history. He has never used smokeless tobacco. He reports current alcohol use of about 19.0 standard drinks of alcohol per week. He  reports that he does not use drugs.   Family History:  The patient's family history includes Atrial fibrillation in his mother; Emphysema in his father; Pneumonia in his father.    ROS:  General:no colds or fevers, no weight changes Skin:no rashes or ulcers HEENT:no blurred vision, no congestion CV:see HPI PUL:see HPI GI:no diarrhea constipation or melena, no indigestion GU:no hematuria, no dysuria MS:no joint pain, no claudication Neuro:no syncope, no lightheadedness Endo:no diabetes, no thyroid disease  Wt Readings from Last 3 Encounters:  11/26/18 185 lb (83.9 kg)  11/23/18 180 lb 14.4 oz (82.1 kg)  11/19/18 179 lb 4.8 oz (81.3 kg)     PHYSICAL EXAM: VS:  BP (!) 142/66    Pulse (!) 138    Ht 6\' 1"  (1.854 m)    Wt 185 lb (83.9 kg)    BMI 24.41 kg/m  , BMI Body mass index is 24.41 kg/m. General:Pleasant affect, NAD Skin:Warm and dry, brisk capillary refill HEENT:normocephalic, sclera clear, mucus membranes moist Neck:supple, no JVD, no bruits  Heart:irreg irreg and rapid without murmur, gallup, rub or click Lungs:clear to diminished without rales, rhonchi, or wheezes YJE:HUDJ, non tender, + BS, do not palpate liver spleen or masses Ext:tr to 1+ Lt lower ext edema. 2+ pedal pulses, 2+ radial pulses Neuro:alert and oriented X 3, MAE, follows commands, + facial symmetry    EKG:  EKG is ordered today. The ekg ordered today demonstrates a fib jwith RVR and non specific ST abnormality but no acute changes from 11/05/18   Recent Labs: 11/05/2018: TSH 3.171 11/09/2018: Magnesium 1.5 11/24/2018: ALT 12; BUN 14; Creatinine 0.84; Hemoglobin 13.5; Platelet Count 269; Potassium 4.3; Sodium 136    Lipid Panel No results found for: CHOL, TRIG, HDL, CHOLHDL, VLDL, LDLCALC, LDLDIRECT     Other studies Reviewed: Additional studies/ records that were reviewed today include:  ECHO 11/06/18. 1. The left ventricle has low normal systolic function, with an ejection fraction of 50-55%. The  cavity size was mildly dilated. Left ventricular diastolic Doppler parameters are indeterminate.  2. Extremely poor acoustic windows limit study.  3. The right ventricle was not well visualized. The cavity was moderately enlarged. There is not assessed.  4. RV is not well seen RVEF is at least mildly depressed.  5. Left atrial size was severely dilated.  6. Right atrial size was severely dilated.  7. The mitral valve is abnormal. Mild thickening of the mitral valve leaflet. There is mild mitral annular calcification present.  8. The tricuspid valve is grossly normal.  9. The aortic valve is abnormal. Mild thickening of the aortic valve. Mild calcification of the aortic valve. Aortic valve  regurgitation is trivial by color flow Doppler. 10. The inferior vena cava was dilated in size with <50% respiratory variability.  FINDINGS  Left Ventricle: The left ventricle has low normal systolic function, with an ejection fraction of 50-55%. The cavity size was mildly dilated. There is no increase in left ventricular wall thickness. Left ventricular diastolic Doppler parameters are  indeterminate. Definity contrast agent was given IV to delineate the left ventricular endocardial borders. Extremely poor acoustic windows limit study.  Right Ventricle: The right ventricle was not well visualized. The cavity was moderately enlarged. There is right vetricular wall thickness was not assessed. RV is not well seen RVEF is at least mildly depressed.  Left Atrium: Left atrial size was severely dilated.  Right Atrium: Right atrial size was severely dilated.  Interatrial Septum: No atrial level shunt detected by color flow Doppler.  Pericardium: There is no evidence of pericardial effusion.  Mitral Valve: The mitral valve is abnormal. Mild thickening of the mitral valve leaflet. There is mild mitral annular calcification present. Mitral valve regurgitation is trivial by color flow Doppler.  Tricuspid  Valve: The tricuspid valve is grossly normal. Tricuspid valve regurgitation is mild by color flow Doppler.  Aortic Valve: The aortic valve is abnormal Mild thickening of the aortic valve. Mild calcification of the aortic valve. Aortic valve regurgitation is trivial by color flow Doppler.  Pulmonic Valve: The pulmonic valve was not well visualized. Pulmonic valve regurgitation is not visualized by color flow Doppler.  Venous: The inferior vena cava is dilated in size with less than 50% respiratory variability.  ASSESSMENT AND PLAN:  1. A fib RVR - on dilt 360, discussed with Dr. Radford Pax DOD and will add toprol XL 50 mg,  He does have COPD but amio could be an issues as well.  Pt on Eliquis and has not missed a dose since discharge on the 13th.  Pt now with concurrent chemo and radiation.  Will ask a fib clinic to see and help decide if DCCV after 3-4 weeks of anticoagulation or after therapy.  HR control may drive that.  Pt is asymptomatic.  On echo dilated Rt and Lt atrium.   2.  Lung cancer Rt lower lobe squamous cell carcinoma with chemo and radiation.  No surgery.    3.  HTN up today BB will help to bring down   4.  Lower ext edema may need to add back HCTZ though HR control may help.  5.  Hypo Mg++  Will check level today other labs just done and WNL     Current medicines are reviewed with the patient today.  The patient Has no concerns regarding medicines.  The following changes have been made:  See above Labs/ tests ordered today include:see above  Disposition:   FU:  see above  Signed, Cecilie Kicks, NP  11/26/2018 9:07 AM    [facility], [place], [place], [place] Phone: [phone]; Fax: [phone]

## 2018-11-26 ENCOUNTER — Encounter: Payer: Self-pay | Admitting: Cardiology

## 2018-11-26 ENCOUNTER — Ambulatory Visit: Payer: Medicare HMO | Admitting: Cardiology

## 2018-11-26 ENCOUNTER — Ambulatory Visit
Admission: RE | Admit: 2018-11-26 | Discharge: 2018-11-26 | Disposition: A | Discharge status: Home / Self Care | Payer: Medicare HMO | Source: Ambulatory Visit | Attending: Radiation Oncology | Admitting: Radiation Oncology

## 2018-11-26 ENCOUNTER — Other Ambulatory Visit: Payer: Self-pay

## 2018-11-26 VITALS — BP 142/66 | HR 138 | Ht 73.0 in | Wt 185.0 lb

## 2018-11-26 DIAGNOSIS — Z87891 Personal history of nicotine dependence: Secondary | ICD-10-CM | POA: Diagnosis not present

## 2018-11-26 DIAGNOSIS — R000 Tachycardia, unspecified: Secondary | ICD-10-CM

## 2018-11-26 DIAGNOSIS — I4819 Other persistent atrial fibrillation: Secondary | ICD-10-CM | POA: Diagnosis not present

## 2018-11-26 DIAGNOSIS — C3410 Malignant neoplasm of upper lobe, unspecified bronchus or lung: Secondary | ICD-10-CM

## 2018-11-26 DIAGNOSIS — R600 Localized edema: Secondary | ICD-10-CM

## 2018-11-26 DIAGNOSIS — I10 Essential (primary) hypertension: Secondary | ICD-10-CM | POA: Diagnosis not present

## 2018-11-26 DIAGNOSIS — Z510 Encounter for antineoplastic radiation therapy: Secondary | ICD-10-CM | POA: Diagnosis not present

## 2018-11-26 DIAGNOSIS — C3431 Malignant neoplasm of lower lobe, right bronchus or lung: Secondary | ICD-10-CM | POA: Diagnosis not present

## 2018-11-26 LAB — MAGNESIUM: Magnesium: 1.8 mg/dL (ref 1.6–2.3)

## 2018-11-26 MED ORDER — METOPROLOL SUCCINATE ER 50 MG PO TB24: 50.0000 mg | ORAL_TABLET | Freq: Every day | ORAL | 2 refills | Status: DC

## 2018-11-26 NOTE — Patient Instructions (Addendum)
Medication Instructions:   START TAKING TOPROL XL 50 MG ONCE A DAY    If you need a refill on your cardiac medications before your next appointment, please call your pharmacy.   Lab work: MAGNESIUM LEVEL   If you have labs (blood work) drawn today and your tests are completely normal, you will receive your results only by: Marland Kitchen MyChart Message (if you have MyChart) OR . A paper copy in the mail If you have any lab test that is abnormal or we need to change your treatment, we will call you to review the results.  Testing/Procedures: NONE ORDERED  TODAY    Follow-Up: WITH [facility] IN 2 WEEKS   WITH DR Marlou Porch  IN 3 MONTHS   Any Other Special Instructions Will Be Listed Below (If Applicable).

## 2018-11-27 ENCOUNTER — Ambulatory Visit
Admission: RE | Admit: 2018-11-27 | Discharge: 2018-11-27 | Disposition: A | Discharge status: Home / Self Care | Payer: Medicare HMO | Source: Ambulatory Visit | Attending: Radiation Oncology | Admitting: Radiation Oncology

## 2018-11-27 ENCOUNTER — Other Ambulatory Visit: Payer: Self-pay

## 2018-11-27 ENCOUNTER — Other Ambulatory Visit: Payer: Self-pay | Admitting: Physician Assistant

## 2018-11-27 DIAGNOSIS — Z510 Encounter for antineoplastic radiation therapy: Secondary | ICD-10-CM | POA: Diagnosis not present

## 2018-11-27 DIAGNOSIS — Z87891 Personal history of nicotine dependence: Secondary | ICD-10-CM | POA: Diagnosis not present

## 2018-11-27 DIAGNOSIS — C3431 Malignant neoplasm of lower lobe, right bronchus or lung: Secondary | ICD-10-CM | POA: Diagnosis not present

## 2018-11-27 DIAGNOSIS — C3491 Malignant neoplasm of unspecified part of right bronchus or lung: Secondary | ICD-10-CM

## 2018-11-30 ENCOUNTER — Inpatient Hospital Stay (HOSPITAL_BASED_OUTPATIENT_CLINIC_OR_DEPARTMENT_OTHER): Payer: Medicare HMO | Admitting: Physician Assistant

## 2018-11-30 ENCOUNTER — Other Ambulatory Visit: Payer: Self-pay

## 2018-11-30 ENCOUNTER — Inpatient Hospital Stay: Payer: Medicare HMO

## 2018-11-30 ENCOUNTER — Encounter: Payer: Self-pay | Admitting: Physician Assistant

## 2018-11-30 ENCOUNTER — Inpatient Hospital Stay: Payer: Medicare HMO | Attending: Internal Medicine

## 2018-11-30 ENCOUNTER — Other Ambulatory Visit: Payer: Medicare HMO

## 2018-11-30 ENCOUNTER — Inpatient Hospital Stay (HOSPITAL_BASED_OUTPATIENT_CLINIC_OR_DEPARTMENT_OTHER): Payer: Medicare HMO | Admitting: Medical

## 2018-11-30 ENCOUNTER — Ambulatory Visit
Admission: RE | Admit: 2018-11-30 | Discharge: 2018-11-30 | Disposition: A | Discharge status: Home / Self Care | Payer: Medicare HMO | Source: Ambulatory Visit | Attending: Radiation Oncology | Admitting: Radiation Oncology

## 2018-11-30 VITALS — BP 124/63 | HR 69 | Resp 18

## 2018-11-30 VITALS — BP 142/74 | HR 104 | Temp 99.1°F | Resp 16 | Ht 73.0 in | Wt 187.3 lb

## 2018-11-30 DIAGNOSIS — D696 Thrombocytopenia, unspecified: Secondary | ICD-10-CM | POA: Insufficient documentation

## 2018-11-30 DIAGNOSIS — C3491 Malignant neoplasm of unspecified part of right bronchus or lung: Secondary | ICD-10-CM

## 2018-11-30 DIAGNOSIS — I4891 Unspecified atrial fibrillation: Secondary | ICD-10-CM | POA: Insufficient documentation

## 2018-11-30 DIAGNOSIS — C3431 Malignant neoplasm of lower lobe, right bronchus or lung: Secondary | ICD-10-CM

## 2018-11-30 DIAGNOSIS — Z5111 Encounter for antineoplastic chemotherapy: Secondary | ICD-10-CM | POA: Diagnosis not present

## 2018-11-30 DIAGNOSIS — M7989 Other specified soft tissue disorders: Secondary | ICD-10-CM

## 2018-11-30 DIAGNOSIS — Z87891 Personal history of nicotine dependence: Secondary | ICD-10-CM | POA: Insufficient documentation

## 2018-11-30 DIAGNOSIS — Z510 Encounter for antineoplastic radiation therapy: Secondary | ICD-10-CM | POA: Insufficient documentation

## 2018-11-30 DIAGNOSIS — Z7901 Long term (current) use of anticoagulants: Secondary | ICD-10-CM | POA: Insufficient documentation

## 2018-11-30 DIAGNOSIS — Z79899 Other long term (current) drug therapy: Secondary | ICD-10-CM | POA: Insufficient documentation

## 2018-11-30 DIAGNOSIS — I4819 Other persistent atrial fibrillation: Secondary | ICD-10-CM

## 2018-11-30 DIAGNOSIS — D709 Neutropenia, unspecified: Secondary | ICD-10-CM | POA: Diagnosis not present

## 2018-11-30 LAB — CMP (CANCER CENTER ONLY)
ALT: 14 U/L (ref 0–44)
AST: 14 U/L — ABNORMAL LOW (ref 15–41)
Albumin: 3.2 g/dL — ABNORMAL LOW (ref 3.5–5.0)
Alkaline Phosphatase: 72 U/L (ref 38–126)
Anion gap: 9 (ref 5–15)
BUN: 11 mg/dL (ref 8–23)
CO2: 22 mmol/L (ref 22–32)
Calcium: 9.1 mg/dL (ref 8.9–10.3)
Chloride: 104 mmol/L (ref 98–111)
Creatinine: 0.79 mg/dL (ref 0.61–1.24)
GFR, Est AFR Am: >60 mL/min (ref >60)
GFR, Estimated: >60 mL/min (ref >60)
Glucose, Bld: 123 mg/dL — ABNORMAL HIGH (ref 70–99)
Potassium: 4.6 mmol/L (ref 3.5–5.1)
Sodium: 135 mmol/L (ref 135–145)
Total Bilirubin: 0.9 mg/dL (ref 0.3–1.2)
Total Protein: 6.4 g/dL — ABNORMAL LOW (ref 6.5–8.1)

## 2018-11-30 LAB — CBC WITH DIFFERENTIAL (CANCER CENTER ONLY)
Abs Immature Granulocytes: 0.03 10*3/uL (ref 0.00–0.07)
Basophils Absolute: 0.1 10*3/uL (ref 0.0–0.1)
Basophils Relative: 1 %
Eosinophils Absolute: 0.1 10*3/uL (ref 0.0–0.5)
Eosinophils Relative: 2 %
HCT: 35.7 % — ABNORMAL LOW (ref 39.0–52.0)
Hemoglobin: 12.4 g/dL — ABNORMAL LOW (ref 13.0–17.0)
Immature Granulocytes: 1 %
Lymphocytes Relative: 16 %
Lymphs Abs: 0.6 10*3/uL — ABNORMAL LOW (ref 0.7–4.0)
MCH: 37.1 pg — ABNORMAL HIGH (ref 26.0–34.0)
MCHC: 34.7 g/dL (ref 30.0–36.0)
MCV: 106.9 fL — ABNORMAL HIGH (ref 80.0–100.0)
Monocytes Absolute: 0.2 10*3/uL (ref 0.1–1.0)
Monocytes Relative: 5 %
Neutro Abs: 2.9 10*3/uL (ref 1.7–7.7)
Neutrophils Relative %: 75 %
Platelet Count: 209 10*3/uL (ref 150–400)
RBC: 3.34 MIL/uL — ABNORMAL LOW (ref 4.22–5.81)
RDW: 13.7 % (ref 11.5–15.5)
WBC Count: 3.8 10*3/uL — ABNORMAL LOW (ref 4.0–10.5)
nRBC: 0 % (ref 0.0–0.2)

## 2018-11-30 MED ORDER — DIPHENHYDRAMINE HCL 50 MG/ML IJ SOLN
50.0000 mg | Freq: Once | INTRAMUSCULAR | Status: AC
  Administered 2018-11-30: 50 mg via INTRAVENOUS

## 2018-11-30 MED ORDER — PALONOSETRON HCL INJECTION 0.25 MG/5ML
0.2500 mg | Freq: Once | INTRAVENOUS | Status: AC
  Administered 2018-11-30: 0.25 mg via INTRAVENOUS

## 2018-11-30 MED ORDER — SODIUM CHLORIDE 0.9 % IV SOLN
210.0000 mg | Freq: Once | INTRAVENOUS | Status: AC
  Administered 2018-11-30: 210 mg via INTRAVENOUS
  Filled 2018-11-30: qty 21

## 2018-11-30 MED ORDER — SODIUM CHLORIDE 0.9 % IV SOLN
Freq: Once | INTRAVENOUS | Status: AC
  Administered 2018-11-30: 10:00:00 via INTRAVENOUS
  Filled 2018-11-30: qty 250

## 2018-11-30 MED ORDER — SODIUM CHLORIDE 0.9 % IV SOLN
45.0000 mg/m2 | Freq: Once | INTRAVENOUS | Status: AC
  Administered 2018-11-30: 90 mg via INTRAVENOUS
  Filled 2018-11-30: qty 15

## 2018-11-30 MED ORDER — DIPHENHYDRAMINE HCL 50 MG/ML IJ SOLN
INTRAMUSCULAR | Status: AC
  Filled 2018-11-30: qty 1

## 2018-11-30 MED ORDER — PALONOSETRON HCL INJECTION 0.25 MG/5ML
INTRAVENOUS | Status: AC
  Filled 2018-11-30: qty 5

## 2018-11-30 MED ORDER — SODIUM CHLORIDE 0.9 % IV SOLN
20.0000 mg | Freq: Once | INTRAVENOUS | Status: AC
  Administered 2018-11-30: 20 mg via INTRAVENOUS
  Filled 2018-11-30: qty 20

## 2018-11-30 MED ORDER — FAMOTIDINE IN NACL 20-0.9 MG/50ML-% IV SOLN
INTRAVENOUS | Status: AC
  Filled 2018-11-30: qty 50

## 2018-11-30 MED ORDER — FAMOTIDINE IN NACL 20-0.9 MG/50ML-% IV SOLN
20.0000 mg | Freq: Once | INTRAVENOUS | Status: AC
  Administered 2018-11-30: 11:00:00 20 mg via INTRAVENOUS

## 2018-11-30 NOTE — Patient Instructions (Signed)
[facility] Discharge Instructions for Patients Receiving Chemotherapy  Today you received the following chemotherapy agents: Taxol and Carboplatin.  To help prevent nausea and vomiting after your treatment, we encourage you to take your nausea medication as directed.   If you develop nausea and vomiting that is not controlled by your nausea medication, call the clinic.   BELOW ARE SYMPTOMS THAT SHOULD BE REPORTED IMMEDIATELY:  *FEVER GREATER THAN 100.5 F  *CHILLS WITH OR WITHOUT FEVER  NAUSEA AND VOMITING THAT IS NOT CONTROLLED WITH YOUR NAUSEA MEDICATION  *UNUSUAL SHORTNESS OF BREATH  *UNUSUAL BRUISING OR BLEEDING  TENDERNESS IN MOUTH AND THROAT WITH OR WITHOUT PRESENCE OF ULCERS  *URINARY PROBLEMS  *BOWEL PROBLEMS  UNUSUAL RASH Items with * indicate a potential emergency and should be followed up as soon as possible.  Feel free to call the clinic should you have any questions or concerns. The clinic phone number is [phone].  Please show the [facility] at check-in to the Emergency Department and triage nurse.  P

## 2018-11-30 NOTE — Progress Notes (Signed)
[facility] OFFICE PROGRESS NOTE  [name], MD [place] [id number]  DIAGNOSIS: Stage IIIa (T3, N1, M0) non-small cell lung cancer, squamous cell carcinoma diagnosed in July 2020 and presented with right middle lobe mass and right hilar adenopathy with postobstructive pneumonia and suspicious right pleural effusion.  PRIOR THERAPY: None  CURRENT THERAPY: Weekly concurrent chemoradiation with Carboplatin for an AUC of 2 and paclitaxel 45 mg/m2. First dose 11/23/2018. Status post 1 cycle.   INTERVAL HISTORY: Joe Cole 68 y.o. male returns to the clinic for a follow-up visit.  The patient is feeling well today without any concerning complaints except for mild bilateral lower extremity edema which started last week.  The patient saw his cardiologist last week who encouraged the patient to use compression stockings as well as elevate his lower extremities. These interventions improve his symptoms. He denies any calf pain or tenderness.    The patient recently completed his first cycle of concurrent chemoradiation and tolerated well without any adverse side effects.  He had a mild episode of nausea the day of chemotherapy which was successfully managed with use of Compazine.  He denies any fevers, chills, night sweats, or weight loss.  He denies any chest pain, cough, or hemoptysis but endorses his baseline shortness of breath with exertion.  He denies any more nausea, vomiting, diarrhea, or constipation.  He denies any headaches or visual changes.  The patient is scheduled to have his brain MRI performed later this week to complete the staging work-up.  He is here today for evaluation before starting cycle #2.  MEDICAL HISTORY: Past Medical History:  Diagnosis Date  . Arthritis   . Atrial fibrillation ([place]) 10/2018  . COPD with emphysema ([place])   . Dyspnea   . Hypertension     ALLERGIES:  has No Known Allergies.  MEDICATIONS:  Current  Outpatient Medications  Medication Sig Dispense Refill  . albuterol (PROAIR HFA) 108 (90 Base) MCG/ACT inhaler Inhale 2 puffs into the lungs every 6 (six) hours as needed for wheezing or shortness of breath. 1 Inhaler 5  . apixaban (ELIQUIS) 5 MG TABS tablet Take 1 tablet (5 mg total) by mouth 2 (two) times daily. 60 tablet 3  . atorvastatin (LIPITOR) 20 MG tablet Take 20 mg by mouth daily.    Marland Kitchen diltiazem (CARDIZEM CD) 360 MG 24 hr capsule Take 1 capsule (360 mg total) by mouth daily. 30 capsule 3  . docusate sodium (COLACE) 100 MG capsule Take 1 capsule (100 mg total) by mouth 2 (two) times daily. 10 capsule 0  . metoprolol succinate (TOPROL XL) 50 MG 24 hr tablet Take 1 tablet (50 mg total) by mouth daily. Take with or immediately following a meal. 90 tablet 2  . Probiotic Product (ALIGN) 4 MG CAPS Take 4 mg by mouth daily.    . traZODone (DESYREL) 50 MG tablet Take 1 tablet (50 mg total) by mouth at bedtime. 30 tablet 2  . umeclidinium-vilanterol (ANORO ELLIPTA) 62.5-25 MCG/INH AEPB Inhale 1 puff into the lungs daily. 1 each 6  . diphenhydramine-acetaminophen (TYLENOL PM) 25-500 MG TABS tablet Take 2 tablets by mouth at bedtime as needed (sleep).     . prochlorperazine (COMPAZINE) 10 MG tablet Take 1 tablet (10 mg total) by mouth every 6 (six) hours as needed for nausea or vomiting. (Patient not taking: Reported on 11/30/2018) 30 tablet 0   No current facility-administered medications for this visit.  SURGICAL HISTORY:  Past Surgical History:  Procedure Laterality Date  . KNEE ARTHROSCOPY     BIL   . TOE SURGERY     PINNED LEFT BIG TOE  . TONSILLECTOMY     T+A  AS CHILD  . TOTAL KNEE ARTHROPLASTY Left 08/19/2017   Procedure: TOTAL KNEE ARTHROPLASTY;  Surgeon: Renette Butters, MD;  Location: [facility];  Service: Orthopedics;  Laterality: Left;  Marland Kitchen VIDEO BRONCHOSCOPY Bilateral 11/05/2018   Procedure: VIDEO BRONCHOSCOPY WITH FLUORO;  Surgeon: [name], MD;  [facility] ENDOSCOPY;   Service: Cardiopulmonary;  Laterality: Bilateral;  . VIDEO BRONCHOSCOPY Bilateral 11/09/2018   Procedure: VIDEO BRONCHOSCOPY WITH FLUORO;  Surgeon: Rigoberto Noel, MD;  Location: [facility];  Service: Cardiopulmonary;  Laterality: Bilateral;    REVIEW OF SYSTEMS:   Review of Systems  Constitutional: Negative for appetite change, chills, fatigue, fever and unexpected weight change.  HENT: Negative for mouth sores, nosebleeds, sore throat and trouble swallowing.   Eyes: Negative for eye problems and icterus.  Respiratory: Positive for baseline shortness of breath. Negative for cough, hemoptysis, and wheezing.   Cardiovascular: Positive for bilateral lower extremity edema. Negative for chest pain or calf tenderness.  Gastrointestinal: Positive for one episode of mild nausea after first treatment last week (resolved). Negative for abdominal pain, constipation, diarrhea, and vomiting.  Genitourinary: Negative for bladder incontinence, difficulty urinating, dysuria, frequency and hematuria.   Musculoskeletal: Negative for back pain, gait problem, neck pain and neck stiffness.  Skin: Negative for itching and rash.  Neurological: Negative for dizziness, extremity weakness, gait problem, headaches, light-headedness and seizures.  Hematological: Negative for adenopathy. Does not bruise/bleed easily.  Psychiatric/Behavioral: Negative for confusion, depression and sleep disturbance. The patient is not nervous/anxious.     PHYSICAL EXAMINATION:  Blood pressure (!) 142/74, pulse (!) 104, temperature 99.1 F (37.3 C), temperature source Oral, resp. rate 16, height 6\' 1"  (1.854 m), weight 187 lb 4.8 oz (85 kg), SpO2 100 %.  ECOG PERFORMANCE STATUS: 1 - Symptomatic but completely ambulatory  Physical Exam  Constitutional: Oriented to person, place, and time and well-developed, well-nourished, and in no distress.  HENT:  Head: Normocephalic and atraumatic.  Mouth/Throat: Oropharynx is clear and moist.  No oropharyngeal exudate.  Eyes: Conjunctivae are normal. Right eye exhibits no discharge. Left eye exhibits no discharge. No scleral icterus.  Neck: Normal range of motion. Neck supple.  Cardiovascular: Irregular rhythm. Normal heart sounds and intact distal pulses.   Pulmonary/Chest: Effort normal and breath sounds normal but diminished. No respiratory distress. No wheezes. No rales.  Abdominal: Soft. Bowel sounds are normal. Exhibits no distension and no mass. There is no tenderness.  Musculoskeletal: Bilateral lower extremity edema. Normal range of motion.   Lymphadenopathy:    No cervical adenopathy.  Neurological: Alert and oriented to person, place, and time. Exhibits normal muscle tone. Gait normal. Coordination normal.  Skin: Skin is warm and dry. No rash noted. Not diaphoretic. No erythema. No pallor.  Psychiatric: Mood, memory and judgment normal.  Vitals reviewed.  LABORATORY DATA: Lab Results  Component Value Date   WBC 3.8 (L) 11/30/2018   HGB 12.4 (L) 11/30/2018   HCT 35.7 (L) 11/30/2018   MCV 106.9 (H) 11/30/2018   PLT 209 11/30/2018      Chemistry      Component Value Date/Time   NA 135 11/30/2018 0802   K 4.6 11/30/2018 0802   CL 104 11/30/2018 0802   CO2 22 11/30/2018 0802   BUN 11 11/30/2018 0802  CREATININE 0.79 11/30/2018 0802      Component Value Date/Time   CALCIUM 9.1 11/30/2018 0802   ALKPHOS 72 11/30/2018 0802   AST 14 (L) 11/30/2018 0802   ALT 14 11/30/2018 0802   BILITOT 0.9 11/30/2018 0802       RADIOGRAPHIC STUDIES:  Dg Chest Port 1 View  Result Date: 11/05/2018 CLINICAL DATA:  COPD.  Emphysema. EXAM: PORTABLE CHEST 1 VIEW COMPARISON:  CT 10/29/2018. Chest x-ray 10/19/2018. Chest x-ray 02/25/2018. FINDINGS: Mediastinum and hilar structures normal. Right infrahilar mass with atelectasis/infiltrate again noted. Stable cardiomegaly with prominent central pulmonary arteries suggesting pulmonary hypertension. IMPRESSION: 1. Right infrahilar  mass with right base atelectasis/infiltrate again noted. Similar finding noted on prior exam. 2. Stable cardiomegaly with prominent central pulmonary arteries suggesting pulmonary hypertension. Electronically Signed   By: Marcello Moores  Register   On: 11/05/2018 16:36     ASSESSMENT/PLAN:  This is a very pleasant 68 year old Caucasian male recently diagnosed with stage IIIa (T3, N1, M0) non-small cell lung cancer, squamous cell carcinoma.  He presented with a right middle lobe lung mass and right hilar adenopathy with postobstructive pneumonia and a suspicious right pleural effusion.  He was diagnosed in July 2020.  The patient is currently undergoing concurrent chemoradiation with carboplatin for an AUC of 2 and paclitaxel 45 mg/m.  He is status post 1 cycle.  He tolerated his treatment well without any adverse side effects.  His last day radiation is scheduled for January 05, 2019.  The patient was seen with Dr. Julien Nordmann today.  Labs were reviewed with the patient.  We recommend that he proceed with cycle #2 today scheduled.  I will see him back for follow-up visit in 2 weeks for evaluation before starting cycle #4.  The patient will have his brain MRI performed later this week to complete the staging work-up  The patient notes bilateral lower extremity swelling starting last week.  The patient recently saw his cardiologist who advised him that it may be secondary to his atrial fibrillation.  The patient states that his swelling subsides with elevation of his lower extremities and decreases upon waking up in the morning after laying flat overnight.  He denies any calf tenderness, worsening shortness of breath, or chest pain.  The patient will continue to elevate extremities as well as wear compression stockings.  His albumin was slightly low today at 3.2 which may be contributory.  He was advised to also increase his protein intake.  The patient was advised to call immediately if he has any concerning  symptoms in the interval. The patient voices understanding of current disease status and treatment options and is in agreement with the current care plan. All questions were answered. The patient knows to call the clinic with any problems, questions or concerns. We can certainly see the patient much sooner if necessary  No orders of the defined types were placed in this encounter.    Dayne Chait L Verlin Duke, PA-C 11/30/18  ADDENDUM: Hematology/Oncology Attending: I had a face-to-face encounter with the patient today.  I recommended his care plan.  This is a very pleasant 68 years old white male with a stage IIIa non-small cell lung cancer, squamous cell carcinoma.  The patient is currently undergoing a course of concurrent chemoradiation with weekly carboplatin and paclitaxel status post 1 cycle. He tolerated the first week of his treatment well with no concerning adverse effects. I recommended for the patient to proceed with cycle #2 today as planned. We will continue to monitor  him closely with follow-up visit in 2 weeks. He was advised to call immediately if he has any concerning symptoms in the interval.  Disclaimer: This note was dictated with voice recognition software. Similar sounding words can inadvertently be transcribed and may be missed upon review. Eilleen Kempf, MD 11/30/18

## 2018-11-30 NOTE — Progress Notes (Signed)
The patient was seen in the infusion room today as he was receiving cycle 2, day 1 of carboplatin and paclitaxel.  He had nearly completed his infusion of paclitaxel when it was noted that he was bradycardic and reported some dizziness.  The patient was initially seen his blood pressure was noted to be 124/84 with his pulse ranging from 25 to 40 bpm.  His oxygen saturation was 97% on room air.  He has a history of A. fib and is followed by cardiology.  His pulse rate improved and was ranging from 65 to 95 after paclitaxel was paused.  The patient's pulse through palpation of his radial pulse and auscultation of his heart should a rate that was irregularly irregular.  The patient was in no acute distress.  His nurse was instructed to restart his Taxol and proceed with his carboplatin dose.  The patient completed the remainder of his infusion today without any further issues of concern. No other intervention was indicated.   Sandi Mealy, MHS, PA-C Physician Assistant

## 2018-11-30 NOTE — Progress Notes (Signed)
Complaining of feeling dizziness with Taxol Infusion at 1242. Taxol infusion paused. Normal saline started via gravity. Sandi Mealy, PA called and over to assess at 1248 and stating dizziness is resolved, heart rate is irregular.  See vital signs. Per Sandi Mealy, PA will give the rest of 250 ml normal saline at 999 ml/hr via pump then resume the rest of Taxol infusion. Infusion resumed after bolus with no problems. No further complaints.

## 2018-12-01 ENCOUNTER — Ambulatory Visit
Admission: RE | Admit: 2018-12-01 | Discharge: 2018-12-01 | Disposition: A | Discharge status: Home / Self Care | Payer: Medicare HMO | Source: Ambulatory Visit | Attending: Radiation Oncology | Admitting: Radiation Oncology

## 2018-12-01 ENCOUNTER — Other Ambulatory Visit: Payer: Self-pay

## 2018-12-01 DIAGNOSIS — Z87891 Personal history of nicotine dependence: Secondary | ICD-10-CM | POA: Diagnosis not present

## 2018-12-01 DIAGNOSIS — Z510 Encounter for antineoplastic radiation therapy: Secondary | ICD-10-CM | POA: Diagnosis not present

## 2018-12-01 DIAGNOSIS — C3431 Malignant neoplasm of lower lobe, right bronchus or lung: Secondary | ICD-10-CM | POA: Diagnosis not present

## 2018-12-02 ENCOUNTER — Ambulatory Visit (HOSPITAL_COMMUNITY)
Admission: RE | Admit: 2018-12-02 | Discharge: 2018-12-02 | Disposition: A | Discharge status: Home / Self Care | Payer: Medicare HMO | Source: Ambulatory Visit | Attending: Internal Medicine | Admitting: Internal Medicine

## 2018-12-02 ENCOUNTER — Ambulatory Visit
Admission: RE | Admit: 2018-12-02 | Discharge: 2018-12-02 | Disposition: A | Discharge status: Home / Self Care | Payer: Medicare HMO | Source: Ambulatory Visit | Attending: Radiation Oncology | Admitting: Radiation Oncology

## 2018-12-02 ENCOUNTER — Other Ambulatory Visit: Payer: Self-pay

## 2018-12-02 DIAGNOSIS — Z87891 Personal history of nicotine dependence: Secondary | ICD-10-CM | POA: Diagnosis not present

## 2018-12-02 DIAGNOSIS — C3490 Malignant neoplasm of unspecified part of unspecified bronchus or lung: Secondary | ICD-10-CM

## 2018-12-02 DIAGNOSIS — Z510 Encounter for antineoplastic radiation therapy: Secondary | ICD-10-CM | POA: Diagnosis not present

## 2018-12-02 DIAGNOSIS — C3431 Malignant neoplasm of lower lobe, right bronchus or lung: Secondary | ICD-10-CM | POA: Diagnosis not present

## 2018-12-02 MED ORDER — GADOBUTROL 1 MMOL/ML IV SOLN
10.0000 mL | Freq: Once | INTRAVENOUS | Status: AC | PRN
  Administered 2018-12-02: 10 mL via INTRAVENOUS

## 2018-12-03 ENCOUNTER — Other Ambulatory Visit: Payer: Self-pay

## 2018-12-03 ENCOUNTER — Ambulatory Visit
Admission: RE | Admit: 2018-12-03 | Discharge: 2018-12-03 | Disposition: A | Discharge status: Home / Self Care | Payer: Medicare HMO | Source: Ambulatory Visit | Attending: Radiation Oncology | Admitting: Radiation Oncology

## 2018-12-03 DIAGNOSIS — Z510 Encounter for antineoplastic radiation therapy: Secondary | ICD-10-CM | POA: Diagnosis not present

## 2018-12-03 DIAGNOSIS — Z87891 Personal history of nicotine dependence: Secondary | ICD-10-CM | POA: Diagnosis not present

## 2018-12-03 DIAGNOSIS — C3431 Malignant neoplasm of lower lobe, right bronchus or lung: Secondary | ICD-10-CM | POA: Diagnosis not present

## 2018-12-04 ENCOUNTER — Telehealth: Payer: Self-pay | Admitting: *Deleted

## 2018-12-04 ENCOUNTER — Ambulatory Visit
Admission: RE | Admit: 2018-12-04 | Discharge: 2018-12-04 | Disposition: A | Discharge status: Home / Self Care | Payer: Medicare HMO | Source: Ambulatory Visit | Attending: Radiation Oncology | Admitting: Radiation Oncology

## 2018-12-04 ENCOUNTER — Other Ambulatory Visit: Payer: Self-pay

## 2018-12-04 DIAGNOSIS — Z510 Encounter for antineoplastic radiation therapy: Secondary | ICD-10-CM | POA: Diagnosis not present

## 2018-12-04 DIAGNOSIS — Z87891 Personal history of nicotine dependence: Secondary | ICD-10-CM | POA: Diagnosis not present

## 2018-12-04 DIAGNOSIS — C3431 Malignant neoplasm of lower lobe, right bronchus or lung: Secondary | ICD-10-CM | POA: Diagnosis not present

## 2018-12-04 NOTE — Telephone Encounter (Signed)
Oncology Nurse Navigator Documentation  Oncology Nurse Navigator Flowsheets 12/04/2018  Diagnosis Status -  Planned Course of Treatment -  Navigator Follow Up Date: -  Navigator Follow Up Reason: -  Navigator Location [facility]  Referral Date to RadOnc/MedOnc -  Navigator Encounter Type Telephone/Ms.Grisanti called and left me 2 vm messages. I called her back and was unable to reach.  I did leave a vm message for her to call me with my name and phone number.    Telephone -  Abnormal Finding Date -  Confirmed Diagnosis Date -  Multidisiplinary Clinic Date -  Treatment Initiated Date -  Patient Visit Type -  Treatment Phase Treatment  Barriers/Navigation Needs Education  Education Other  Interventions Education  Coordination of Care -  Education Method Verbal  Support Groups/Services -  Acuity Level 1  Time Spent with Patient 15

## 2018-12-04 NOTE — Telephone Encounter (Signed)
Received call from pt's wife regarding results of recent brain MRI. Brain MRI revealed no metastatic disease. Shared this information with pt's wife. She voiced understanding. No further questions or concerns

## 2018-12-07 ENCOUNTER — Other Ambulatory Visit: Payer: Medicare HMO

## 2018-12-07 ENCOUNTER — Ambulatory Visit: Payer: Medicare HMO | Admitting: Physician Assistant

## 2018-12-07 ENCOUNTER — Ambulatory Visit
Admission: RE | Admit: 2018-12-07 | Discharge: 2018-12-07 | Disposition: A | Discharge status: Home / Self Care | Payer: Medicare HMO | Source: Ambulatory Visit | Attending: Radiation Oncology | Admitting: Radiation Oncology

## 2018-12-07 ENCOUNTER — Other Ambulatory Visit: Payer: Self-pay

## 2018-12-07 ENCOUNTER — Inpatient Hospital Stay: Payer: Medicare HMO

## 2018-12-07 ENCOUNTER — Inpatient Hospital Stay (HOSPITAL_BASED_OUTPATIENT_CLINIC_OR_DEPARTMENT_OTHER): Payer: Medicare HMO | Admitting: Medical

## 2018-12-07 VITALS — BP 123/88 | HR 71 | Temp 98.5°F | Resp 16

## 2018-12-07 DIAGNOSIS — Z79899 Other long term (current) drug therapy: Secondary | ICD-10-CM | POA: Diagnosis not present

## 2018-12-07 DIAGNOSIS — D709 Neutropenia, unspecified: Secondary | ICD-10-CM | POA: Diagnosis not present

## 2018-12-07 DIAGNOSIS — Z5111 Encounter for antineoplastic chemotherapy: Secondary | ICD-10-CM | POA: Diagnosis not present

## 2018-12-07 DIAGNOSIS — I4891 Unspecified atrial fibrillation: Secondary | ICD-10-CM | POA: Diagnosis not present

## 2018-12-07 DIAGNOSIS — C3431 Malignant neoplasm of lower lobe, right bronchus or lung: Secondary | ICD-10-CM

## 2018-12-07 DIAGNOSIS — G629 Polyneuropathy, unspecified: Secondary | ICD-10-CM

## 2018-12-07 DIAGNOSIS — Z510 Encounter for antineoplastic radiation therapy: Secondary | ICD-10-CM | POA: Diagnosis not present

## 2018-12-07 DIAGNOSIS — Z7901 Long term (current) use of anticoagulants: Secondary | ICD-10-CM | POA: Diagnosis not present

## 2018-12-07 DIAGNOSIS — Z87891 Personal history of nicotine dependence: Secondary | ICD-10-CM | POA: Diagnosis not present

## 2018-12-07 DIAGNOSIS — D696 Thrombocytopenia, unspecified: Secondary | ICD-10-CM | POA: Diagnosis not present

## 2018-12-07 DIAGNOSIS — C3491 Malignant neoplasm of unspecified part of right bronchus or lung: Secondary | ICD-10-CM

## 2018-12-07 LAB — CBC WITH DIFFERENTIAL (CANCER CENTER ONLY)
Abs Immature Granulocytes: 0.03 10*3/uL (ref 0.00–0.07)
Basophils Absolute: 0 10*3/uL (ref 0.0–0.1)
Basophils Relative: 2 %
Eosinophils Absolute: 0.1 10*3/uL (ref 0.0–0.5)
Eosinophils Relative: 3 %
HCT: 32.5 % — ABNORMAL LOW (ref 39.0–52.0)
Hemoglobin: 11.2 g/dL — ABNORMAL LOW (ref 13.0–17.0)
Immature Granulocytes: 1 %
Lymphocytes Relative: 22 %
Lymphs Abs: 0.6 10*3/uL — ABNORMAL LOW (ref 0.7–4.0)
MCH: 37 pg — ABNORMAL HIGH (ref 26.0–34.0)
MCHC: 34.5 g/dL (ref 30.0–36.0)
MCV: 107.3 fL — ABNORMAL HIGH (ref 80.0–100.0)
Monocytes Absolute: 0.2 10*3/uL (ref 0.1–1.0)
Monocytes Relative: 7 %
Neutro Abs: 1.8 10*3/uL (ref 1.7–7.7)
Neutrophils Relative %: 65 %
Platelet Count: 207 10*3/uL (ref 150–400)
RBC: 3.03 MIL/uL — ABNORMAL LOW (ref 4.22–5.81)
RDW: 14.1 % (ref 11.5–15.5)
WBC Count: 2.7 10*3/uL — ABNORMAL LOW (ref 4.0–10.5)
nRBC: 0 % (ref 0.0–0.2)

## 2018-12-07 LAB — CMP (CANCER CENTER ONLY)
ALT: 12 U/L (ref 0–44)
AST: 13 U/L — ABNORMAL LOW (ref 15–41)
Albumin: 3 g/dL — ABNORMAL LOW (ref 3.5–5.0)
Alkaline Phosphatase: 73 U/L (ref 38–126)
Anion gap: 12 (ref 5–15)
BUN: 9 mg/dL (ref 8–23)
CO2: 18 mmol/L — ABNORMAL LOW (ref 22–32)
Calcium: 8.9 mg/dL (ref 8.9–10.3)
Chloride: 104 mmol/L (ref 98–111)
Creatinine: 0.79 mg/dL (ref 0.61–1.24)
GFR, Est AFR Am: >60 mL/min (ref >60)
GFR, Estimated: >60 mL/min (ref >60)
Glucose, Bld: 91 mg/dL (ref 70–99)
Potassium: 4.5 mmol/L (ref 3.5–5.1)
Sodium: 134 mmol/L — ABNORMAL LOW (ref 135–145)
Total Bilirubin: 0.5 mg/dL (ref 0.3–1.2)
Total Protein: 6 g/dL — ABNORMAL LOW (ref 6.5–8.1)

## 2018-12-07 MED ORDER — PALONOSETRON HCL INJECTION 0.25 MG/5ML
INTRAVENOUS | Status: AC
  Filled 2018-12-07: qty 5

## 2018-12-07 MED ORDER — PALONOSETRON HCL INJECTION 0.25 MG/5ML
0.2500 mg | Freq: Once | INTRAVENOUS | Status: AC
  Administered 2018-12-07: 0.25 mg via INTRAVENOUS

## 2018-12-07 MED ORDER — FAMOTIDINE IN NACL 20-0.9 MG/50ML-% IV SOLN
INTRAVENOUS | Status: AC
  Filled 2018-12-07: qty 50

## 2018-12-07 MED ORDER — SODIUM CHLORIDE 0.9 % IV SOLN
Freq: Once | INTRAVENOUS | Status: AC
  Administered 2018-12-07: 11:00:00 via INTRAVENOUS
  Filled 2018-12-07: qty 250

## 2018-12-07 MED ORDER — DIPHENHYDRAMINE HCL 50 MG/ML IJ SOLN
50.0000 mg | Freq: Once | INTRAMUSCULAR | Status: AC
  Administered 2018-12-07: 50 mg via INTRAVENOUS

## 2018-12-07 MED ORDER — SODIUM CHLORIDE 0.9 % IV SOLN
214.8000 mg | Freq: Once | INTRAVENOUS | Status: AC
  Administered 2018-12-07: 210 mg via INTRAVENOUS
  Filled 2018-12-07: qty 21

## 2018-12-07 MED ORDER — SODIUM CHLORIDE 0.9 % IV SOLN
20.0000 mg | Freq: Once | INTRAVENOUS | Status: AC
  Administered 2018-12-07: 20 mg via INTRAVENOUS
  Filled 2018-12-07: qty 2

## 2018-12-07 MED ORDER — DIPHENHYDRAMINE HCL 50 MG/ML IJ SOLN
INTRAMUSCULAR | Status: AC
  Filled 2018-12-07: qty 1

## 2018-12-07 MED ORDER — SODIUM CHLORIDE 0.9 % IV SOLN
45.0000 mg/m2 | Freq: Once | INTRAVENOUS | Status: AC
  Administered 2018-12-07: 90 mg via INTRAVENOUS
  Filled 2018-12-07: qty 15

## 2018-12-07 MED ORDER — FAMOTIDINE IN NACL 20-0.9 MG/50ML-% IV SOLN
20.0000 mg | Freq: Once | INTRAVENOUS | Status: AC
  Administered 2018-12-07: 20 mg via INTRAVENOUS

## 2018-12-07 NOTE — Progress Notes (Signed)
Per Dr. Julien Nordmann, run taxol as first time today.   At approximately 1440, pt c/o sudden onset tingling in right hand. Taxol immediately paused and Sandi Mealy, PA consulted (see vital sign flow sheet and MAR). NS rate increased to 533mL/hr for 5 minutes while patient assessed. RN noted minor swelling by right elbow and pt stated he "hit it on a cabinet a long long time ago" then clarified "about three weeks ago." Pt stated symptoms had resolved after about 5 minutes. Taxol resumed; will continue to monitor.

## 2018-12-07 NOTE — Progress Notes (Signed)
Asked to see patient in infusion today. He reported tingling in his right 4th finger. This has not happened before. He bumped his right elbow 3 weeks ago. This is his 3rd cycle of Taxol. This has not happened before. He shook his right hand with his symptoms abating. The patient was instructed to make [place] aware if this happens again. No other intervention was indicated.  Sandi Mealy, MHS, PA-C Physician Assistant

## 2018-12-07 NOTE — Patient Instructions (Signed)
[facility] Discharge Instructions for Patients Receiving Chemotherapy  Today you received the following chemotherapy agents: Taxol, Carboplatin.  To help prevent nausea and vomiting after your treatment, we encourage you to take your nausea medication as prescribed.   If you develop nausea and vomiting that is not controlled by your nausea medication, call the clinic.   BELOW ARE SYMPTOMS THAT SHOULD BE REPORTED IMMEDIATELY:  *FEVER GREATER THAN 100.5 F  *CHILLS WITH OR WITHOUT FEVER  NAUSEA AND VOMITING THAT IS NOT CONTROLLED WITH YOUR NAUSEA MEDICATION  *UNUSUAL SHORTNESS OF BREATH  *UNUSUAL BRUISING OR BLEEDING  TENDERNESS IN MOUTH AND THROAT WITH OR WITHOUT PRESENCE OF ULCERS  *URINARY PROBLEMS  *BOWEL PROBLEMS  UNUSUAL RASH Items with * indicate a potential emergency and should be followed up as soon as possible.  Feel free to call the clinic should you have any questions or concerns. The clinic phone number is [phone].  Please show the [facility] at check-in to the Emergency Department and triage nurse.  Paclitaxel injection (Taxol) What is this medicine? PACLITAXEL (PAK li TAX el) is a chemotherapy drug. It targets fast dividing cells, like cancer cells, and causes these cells to die. This medicine is used to treat ovarian cancer, breast cancer, lung cancer, Kaposi's sarcoma, and other cancers. This medicine may be used for other purposes; ask your health care provider or pharmacist if you have questions. COMMON BRAND NAME(S): Onxol, Taxol What should I tell my health care provider before I take this medicine? They need to know if you have any of these conditions:  history of irregular heartbeat  liver disease  low blood counts, like low white cell, platelet, or red cell counts  lung or breathing disease, like asthma  tingling of the fingers or toes, or other nerve disorder  an unusual or allergic reaction to paclitaxel, alcohol,  polyoxyethylated castor oil, other chemotherapy, other medicines, foods, dyes, or preservatives  pregnant or trying to get pregnant  breast-feeding How should I use this medicine? This drug is given as an infusion into a vein. It is administered in a hospital or clinic by a specially trained health care professional. Talk to your pediatrician regarding the use of this medicine in children. Special care may be needed. Overdosage: If you think you have taken too much of this medicine contact a poison control center or emergency room at once. NOTE: This medicine is only for you. Do not share this medicine with others. What if I miss a dose? It is important not to miss your dose. Call your doctor or health care professional if you are unable to keep an appointment. What may interact with this medicine? Do not take this medicine with any of the following medications:  disulfiram  metronidazole This medicine may also interact with the following medications:  antiviral medicines for hepatitis, HIV or AIDS  certain antibiotics like erythromycin and clarithromycin  certain medicines for fungal infections like ketoconazole and itraconazole  certain medicines for seizures like carbamazepine, phenobarbital, phenytoin  gemfibrozil  nefazodone  rifampin  St. John's wort This list may not describe all possible interactions. Give your health care provider a list of all the medicines, herbs, non-prescription drugs, or dietary supplements you use. Also tell them if you smoke, drink alcohol, or use illegal drugs. Some items may interact with your medicine. What should I watch for while using this medicine? Your condition will be monitored carefully while you are receiving this medicine. You will need important  blood work done while you are taking this medicine. This medicine can cause serious allergic reactions. To reduce your risk you will need to take other medicine(s) before treatment with this  medicine. If you experience allergic reactions like skin rash, itching or hives, swelling of the face, lips, or tongue, tell your doctor or health care professional right away. In some cases, you may be given additional medicines to help with side effects. Follow all directions for their use. This drug may make you feel generally unwell. This is not uncommon, as chemotherapy can affect healthy cells as well as cancer cells. Report any side effects. Continue your course of treatment even though you feel ill unless your doctor tells you to stop. Call your doctor or health care professional for advice if you get a fever, chills or sore throat, or other symptoms of a cold or flu. Do not treat yourself. This drug decreases your body's ability to fight infections. Try to avoid being around people who are sick. This medicine may increase your risk to bruise or bleed. Call your doctor or health care professional if you notice any unusual bleeding. Be careful brushing and flossing your teeth or using a toothpick because you may get an infection or bleed more easily. If you have any dental work done, tell your dentist you are receiving this medicine. Avoid taking products that contain aspirin, acetaminophen, ibuprofen, naproxen, or ketoprofen unless instructed by your doctor. These medicines may hide a fever. Do not become pregnant while taking this medicine. Women should inform their doctor if they wish to become pregnant or think they might be pregnant. There is a potential for serious side effects to an unborn child. Talk to your health care professional or pharmacist for more information. Do not breast-feed an infant while taking this medicine. Men are advised not to father a child while receiving this medicine. This product may contain alcohol. Ask your pharmacist or healthcare provider if this medicine contains alcohol. Be sure to tell all healthcare providers you are taking this medicine. Certain medicines,  like metronidazole and disulfiram, can cause an unpleasant reaction when taken with alcohol. The reaction includes flushing, headache, nausea, vomiting, sweating, and increased thirst. The reaction can last from 30 minutes to several hours. What side effects may I notice from receiving this medicine? Side effects that you should report to your doctor or health care professional as soon as possible:  allergic reactions like skin rash, itching or hives, swelling of the face, lips, or tongue  breathing problems  changes in vision  fast, irregular heartbeat  high or low blood pressure  mouth sores  pain, tingling, numbness in the hands or feet  signs of decreased platelets or bleeding - bruising, pinpoint red spots on the skin, black, tarry stools, blood in the urine  signs of decreased red blood cells - unusually weak or tired, feeling faint or lightheaded, falls  signs of infection - fever or chills, cough, sore throat, pain or difficulty passing urine  signs and symptoms of liver injury like dark yellow or brown urine; general ill feeling or flu-like symptoms; light-colored stools; loss of appetite; nausea; right upper belly pain; unusually weak or tired; yellowing of the eyes or skin  swelling of the ankles, feet, hands  unusually slow heartbeat Side effects that usually do not require medical attention (report to your doctor or health care professional if they continue or are bothersome):  diarrhea  hair loss  loss of appetite  muscle or joint  pain  nausea, vomiting  pain, redness, or irritation at site where injected  tiredness This list may not describe all possible side effects. Call your doctor for medical advice about side effects. You may report side effects to FDA at 1-800-FDA-1088. Where should I keep my medicine? This drug is given in a hospital or clinic and will not be stored at home. NOTE: This sheet is a summary. It may not cover all possible information.  If you have questions about this medicine, talk to your doctor, pharmacist, or health care provider.  2020 Elsevier/Gold Standard (2016-12-17 13:14:55)   Carboplatin injection What is this medicine? CARBOPLATIN (KAR boe pla tin) is a chemotherapy drug. It targets fast dividing cells, like cancer cells, and causes these cells to die. This medicine is used to treat ovarian cancer and many other cancers. This medicine may be used for other purposes; ask your health care provider or pharmacist if you have questions. COMMON BRAND NAME(S): Paraplatin What should I tell my health care provider before I take this medicine? They need to know if you have any of these conditions:  blood disorders  hearing problems  kidney disease  recent or ongoing radiation therapy  an unusual or allergic reaction to carboplatin, cisplatin, other chemotherapy, other medicines, foods, dyes, or preservatives  pregnant or trying to get pregnant  breast-feeding How should I use this medicine? This drug is usually given as an infusion into a vein. It is administered in a hospital or clinic by a specially trained health care professional. Talk to your pediatrician regarding the use of this medicine in children. Special care may be needed. Overdosage: If you think you have taken too much of this medicine contact a poison control center or emergency room at once. NOTE: This medicine is only for you. Do not share this medicine with others. What if I miss a dose? It is important not to miss a dose. Call your doctor or health care professional if you are unable to keep an appointment. What may interact with this medicine?  medicines for seizures  medicines to increase blood counts like filgrastim, pegfilgrastim, sargramostim  some antibiotics like amikacin, gentamicin, neomycin, streptomycin, tobramycin  vaccines Talk to your doctor or health care professional before taking any of these  medicines:  acetaminophen  aspirin  ibuprofen  ketoprofen  naproxen This list may not describe all possible interactions. Give your health care provider a list of all the medicines, herbs, non-prescription drugs, or dietary supplements you use. Also tell them if you smoke, drink alcohol, or use illegal drugs. Some items may interact with your medicine. What should I watch for while using this medicine? Your condition will be monitored carefully while you are receiving this medicine. You will need important blood work done while you are taking this medicine. This drug may make you feel generally unwell. This is not uncommon, as chemotherapy can affect healthy cells as well as cancer cells. Report any side effects. Continue your course of treatment even though you feel ill unless your doctor tells you to stop. In some cases, you may be given additional medicines to help with side effects. Follow all directions for their use. Call your doctor or health care professional for advice if you get a fever, chills or sore throat, or other symptoms of a cold or flu. Do not treat yourself. This drug decreases your body's ability to fight infections. Try to avoid being around people who are sick. This medicine may increase your risk to  bruise or bleed. Call your doctor or health care professional if you notice any unusual bleeding. Be careful brushing and flossing your teeth or using a toothpick because you may get an infection or bleed more easily. If you have any dental work done, tell your dentist you are receiving this medicine. Avoid taking products that contain aspirin, acetaminophen, ibuprofen, naproxen, or ketoprofen unless instructed by your doctor. These medicines may hide a fever. Do not become pregnant while taking this medicine. Women should inform their doctor if they wish to become pregnant or think they might be pregnant. There is a potential for serious side effects to an unborn child. Talk  to your health care professional or pharmacist for more information. Do not breast-feed an infant while taking this medicine. What side effects may I notice from receiving this medicine? Side effects that you should report to your doctor or health care professional as soon as possible:  allergic reactions like skin rash, itching or hives, swelling of the face, lips, or tongue  signs of infection - fever or chills, cough, sore throat, pain or difficulty passing urine  signs of decreased platelets or bleeding - bruising, pinpoint red spots on the skin, black, tarry stools, nosebleeds  signs of decreased red blood cells - unusually weak or tired, fainting spells, lightheadedness  breathing problems  changes in hearing  changes in vision  chest pain  high blood pressure  low blood counts - This drug may decrease the number of white blood cells, red blood cells and platelets. You may be at increased risk for infections and bleeding.  nausea and vomiting  pain, swelling, redness or irritation at the injection site  pain, tingling, numbness in the hands or feet  problems with balance, talking, walking  trouble passing urine or change in the amount of urine Side effects that usually do not require medical attention (report to your doctor or health care professional if they continue or are bothersome):  hair loss  loss of appetite  metallic taste in the mouth or changes in taste This list may not describe all possible side effects. Call your doctor for medical advice about side effects. You may report side effects to FDA at 1-800-FDA-1088. Where should I keep my medicine? This drug is given in a hospital or clinic and will not be stored at home. NOTE: This sheet is a summary. It may not cover all possible information. If you have questions about this medicine, talk to your doctor, pharmacist, or health care provider.  2020 Elsevier/Gold Standard (2007-07-21 14:38:05)

## 2018-12-08 ENCOUNTER — Ambulatory Visit
Admission: RE | Admit: 2018-12-08 | Discharge: 2018-12-08 | Disposition: A | Discharge status: Home / Self Care | Payer: Medicare HMO | Source: Ambulatory Visit | Attending: Radiation Oncology | Admitting: Radiation Oncology

## 2018-12-08 ENCOUNTER — Other Ambulatory Visit: Payer: Self-pay

## 2018-12-08 DIAGNOSIS — C3431 Malignant neoplasm of lower lobe, right bronchus or lung: Secondary | ICD-10-CM | POA: Diagnosis not present

## 2018-12-08 DIAGNOSIS — Z510 Encounter for antineoplastic radiation therapy: Secondary | ICD-10-CM | POA: Diagnosis not present

## 2018-12-08 DIAGNOSIS — Z87891 Personal history of nicotine dependence: Secondary | ICD-10-CM | POA: Diagnosis not present

## 2018-12-08 LAB — FUNGAL ORGANISM REFLEX

## 2018-12-08 LAB — FUNGUS CULTURE WITH STAIN

## 2018-12-08 LAB — FUNGUS CULTURE RESULT

## 2018-12-09 ENCOUNTER — Telehealth: Payer: Self-pay | Admitting: Primary Care

## 2018-12-09 ENCOUNTER — Ambulatory Visit
Admission: RE | Admit: 2018-12-09 | Discharge: 2018-12-09 | Disposition: A | Discharge status: Home / Self Care | Payer: Medicare HMO | Source: Ambulatory Visit | Attending: Radiation Oncology | Admitting: Radiation Oncology

## 2018-12-09 DIAGNOSIS — C3431 Malignant neoplasm of lower lobe, right bronchus or lung: Secondary | ICD-10-CM | POA: Diagnosis not present

## 2018-12-09 DIAGNOSIS — Z87891 Personal history of nicotine dependence: Secondary | ICD-10-CM | POA: Diagnosis not present

## 2018-12-09 DIAGNOSIS — Z510 Encounter for antineoplastic radiation therapy: Secondary | ICD-10-CM | POA: Diagnosis not present

## 2018-12-09 NOTE — Telephone Encounter (Signed)
-----   Message from Martyn [place], NP sent at 11/23/2018  2:06 PM EDT ----- Call patient to assess sleep- new trazodone

## 2018-12-09 NOTE — Telephone Encounter (Signed)
Left message, he has an upcoming apt with our office. Can follow up on insomnia at that visit.

## 2018-12-10 ENCOUNTER — Ambulatory Visit
Admission: RE | Admit: 2018-12-10 | Discharge: 2018-12-10 | Disposition: A | Discharge status: Home / Self Care | Payer: Medicare HMO | Source: Ambulatory Visit | Attending: Radiation Oncology | Admitting: Radiation Oncology

## 2018-12-10 ENCOUNTER — Other Ambulatory Visit: Payer: Self-pay

## 2018-12-10 ENCOUNTER — Ambulatory Visit (HOSPITAL_COMMUNITY)
Admission: RE | Admit: 2018-12-10 | Discharge: 2018-12-10 | Disposition: A | Discharge status: Home / Self Care | Payer: Medicare HMO | Source: Ambulatory Visit | Attending: Physician Assistant | Admitting: Physician Assistant

## 2018-12-10 VITALS — BP 134/72 | HR 107 | Ht 73.0 in | Wt 186.0 lb

## 2018-12-10 DIAGNOSIS — I10 Essential (primary) hypertension: Secondary | ICD-10-CM | POA: Insufficient documentation

## 2018-12-10 DIAGNOSIS — M7989 Other specified soft tissue disorders: Secondary | ICD-10-CM | POA: Diagnosis not present

## 2018-12-10 DIAGNOSIS — Z79899 Other long term (current) drug therapy: Secondary | ICD-10-CM | POA: Diagnosis not present

## 2018-12-10 DIAGNOSIS — C3490 Malignant neoplasm of unspecified part of unspecified bronchus or lung: Secondary | ICD-10-CM | POA: Insufficient documentation

## 2018-12-10 DIAGNOSIS — R0600 Dyspnea, unspecified: Secondary | ICD-10-CM | POA: Insufficient documentation

## 2018-12-10 DIAGNOSIS — Z87891 Personal history of nicotine dependence: Secondary | ICD-10-CM | POA: Diagnosis not present

## 2018-12-10 DIAGNOSIS — J439 Emphysema, unspecified: Secondary | ICD-10-CM | POA: Insufficient documentation

## 2018-12-10 DIAGNOSIS — I4819 Other persistent atrial fibrillation: Secondary | ICD-10-CM | POA: Insufficient documentation

## 2018-12-10 DIAGNOSIS — C3431 Malignant neoplasm of lower lobe, right bronchus or lung: Secondary | ICD-10-CM | POA: Diagnosis not present

## 2018-12-10 DIAGNOSIS — Z7901 Long term (current) use of anticoagulants: Secondary | ICD-10-CM | POA: Diagnosis not present

## 2018-12-10 DIAGNOSIS — M1990 Unspecified osteoarthritis, unspecified site: Secondary | ICD-10-CM | POA: Diagnosis not present

## 2018-12-10 DIAGNOSIS — E785 Hyperlipidemia, unspecified: Secondary | ICD-10-CM | POA: Diagnosis not present

## 2018-12-10 DIAGNOSIS — Z510 Encounter for antineoplastic radiation therapy: Secondary | ICD-10-CM | POA: Diagnosis not present

## 2018-12-10 MED ORDER — METOPROLOL SUCCINATE ER 50 MG PO TB24: 50.0000 mg | ORAL_TABLET | Freq: Every day | ORAL | 11 refills | Status: DC

## 2018-12-10 MED ORDER — METOPROLOL SUCCINATE ER 25 MG PO TB24: 25.0000 mg | ORAL_TABLET | Freq: Every day | ORAL | 11 refills | Status: DC

## 2018-12-10 NOTE — Patient Instructions (Signed)
Increase Metoprolol to 75 mg at bedtime  Take a 25 mg tablet with your already prescribed 50 mg tablet.

## 2018-12-10 NOTE — Progress Notes (Signed)
Primary Care Physician: [name], MD Primary Cardiologist: Dr Marlou Porch Primary Electrophysiologist: none Referring Physician: Cecilie Kicks NP   Joe Cole is a 68 y.o. male with a history of tobacco abuse, HTN, HLD, COPD w/ emphysema, persistent atrial fibrillation, and lung cancer who presents for consultation in the [facility].  The patient was initially diagnosed with atrial fibrillation on 11/05/18 after presenting to the hospital for bronchoscopy. His heart rate was 140 bpm but the patient was asymptomatic. He does admit to swelling in his legs which has worsened over the last couple months. He was recently started on Toprol for better rate control. He denies any missed doses of anticoagulation. He denies significant snoring. He does admit to alcohol use, 1-2 drinks daily.   Today, he denies symptoms of palpitations, chest pain, orthopnea, PND, dizziness, presyncope, syncope, snoring, daytime somnolence, bleeding, or neurologic sequela. The patient is tolerating medications without difficulties and is otherwise without complaint today.    Atrial Fibrillation Risk Factors:  he does not have symptoms or diagnosis of sleep apnea. he does not have a history of rheumatic fever. he does have a history of alcohol use. The patient does have a history of early familial atrial fibrillation or other arrhythmias. Mother had afib.  he has a BMI of Body mass index is 24.54 kg/m.Marland Kitchen Filed Weights   12/10/18 1435  Weight: 84.4 kg    Family History  Problem Relation Age of Onset  . Atrial fibrillation Mother   . Emphysema Father   . Pneumonia Father      Atrial Fibrillation Management history:  Previous antiarrhythmic drugs: none Previous cardioversions: none Previous ablations: none CHADS2VASC score: 2 Anticoagulation history: Eliquis   Past Medical History:  Diagnosis Date  . Arthritis   . Atrial fibrillation ([facility]) 10/2018  . COPD with  emphysema ([facility])   . Dyspnea   . Hypertension    Past Surgical History:  Procedure Laterality Date  . KNEE ARTHROSCOPY     BIL   . TOE SURGERY     PINNED LEFT BIG TOE  . TONSILLECTOMY     T+A  AS CHILD  . TOTAL KNEE ARTHROPLASTY Left 08/19/2017   Procedure: TOTAL KNEE ARTHROPLASTY;  Surgeon: Renette Butters, MD;  Location: [facility];  Service: Orthopedics;  Laterality: Left;  Marland Kitchen VIDEO BRONCHOSCOPY Bilateral 11/05/2018   Procedure: VIDEO BRONCHOSCOPY WITH FLUORO;  Surgeon: [name], MD;  Location: [facility] ENDOSCOPY;  Service: Cardiopulmonary;  Laterality: Bilateral;  . VIDEO BRONCHOSCOPY Bilateral 11/09/2018   Procedure: VIDEO BRONCHOSCOPY WITH FLUORO;  Surgeon: Rigoberto Noel, MD;  Location: [facility];  Service: Cardiopulmonary;  Laterality: Bilateral;    Current Outpatient Medications  Medication Sig Dispense Refill  . albuterol (PROAIR HFA) 108 (90 Base) MCG/ACT inhaler Inhale 2 puffs into the lungs every 6 (six) hours as needed for wheezing or shortness of breath. 1 Inhaler 5  . apixaban (ELIQUIS) 5 MG TABS tablet Take 1 tablet (5 mg total) by mouth 2 (two) times daily. 60 tablet 3  . atorvastatin (LIPITOR) 20 MG tablet Take 20 mg by mouth daily.    Marland Kitchen diltiazem (CARDIZEM CD) 360 MG 24 hr capsule Take 1 capsule (360 mg total) by mouth daily. 30 capsule 3  . diphenhydramine-acetaminophen (TYLENOL PM) 25-500 MG TABS tablet Take 2 tablets by mouth at bedtime as needed (sleep).     . docusate sodium (COLACE) 100 MG capsule Take 1 capsule (100 mg total) by mouth 2 (two) times daily.  10 capsule 0  . Probiotic Product (ALIGN) 4 MG CAPS Take 4 mg by mouth daily.    . prochlorperazine (COMPAZINE) 10 MG tablet Take 1 tablet (10 mg total) by mouth every 6 (six) hours as needed for nausea or vomiting. 30 tablet 0  . traZODone (DESYREL) 50 MG tablet Take 1 tablet (50 mg total) by mouth at bedtime. 30 tablet 2  . umeclidinium-vilanterol (ANORO ELLIPTA) 62.5-25 MCG/INH AEPB Inhale 1 puff into the lungs  daily. 1 each 6  . metoprolol succinate (TOPROL XL) 25 MG 24 hr tablet Take 1 tablet (25 mg total) by mouth at bedtime. 30 tablet 11  . metoprolol succinate (TOPROL XL) 50 MG 24 hr tablet Take 1 tablet (50 mg total) by mouth at bedtime. Take with or immediately following a meal. 30 tablet 11   No current facility-administered medications for this encounter.     No Known Allergies  Social History   Socioeconomic History  . Marital status: Married    Spouse name: Not on file  . Number of children: Not on file  . Years of education: Not on file  . Highest education level: Not on file  Occupational History  . Not on file  Social Needs  . Financial resource strain: Not on file  . Food insecurity    Worry: Not on file    Inability: Not on file  . Transportation needs    Medical: Not on file    Non-medical: Not on file  Tobacco Use  . Smoking status: Former Smoker    Packs/day: 1.50    Years: 46.00    Pack years: 69.00    Types: Cigarettes    Quit date: 09/08/2018    Years since quitting: 0.2  . Smokeless tobacco: Never Used  Substance and Sexual Activity  . Alcohol use: Yes    Alcohol/week: 19.0 standard drinks    Types: 7 Cans of beer, 12 Shots of liquor per week    Comment: FEW DRINKS AFTER WORK  . Drug use: Never  . Sexual activity: Not on file  Lifestyle  . Physical activity    Days per week: Not on file    Minutes per session: Not on file  . Stress: Not on file  Relationships  . Social Herbalist on phone: Not on file    Gets together: Not on file    Attends religious service: Not on file    Active member of club or organization: Not on file    Attends meetings of clubs or organizations: Not on file    Relationship status: Not on file  . Intimate partner violence    Fear of current or ex partner: Not on file    Emotionally abused: Not on file    Physically abused: Not on file    Forced sexual activity: Not on file  Other Topics Concern  . Not on  file  Social History Narrative  . Not on file     ROS- All systems are reviewed and negative except as per the HPI above.  Physical Exam: Vitals:   12/10/18 1435  BP: 134/72  Pulse: (!) 107  Weight: 84.4 kg  Height: 6\' 1"  (1.854 m)    GEN- The patient is well appearing, alert and oriented x 3 today.   Head- normocephalic, atraumatic Eyes-  Sclera clear, conjunctiva pink Ears- hearing intact Oropharynx- clear Neck- supple  Lungs- Clear to ausculation bilaterally, normal work of breathing Heart- irregular rate and  rhythm, no murmurs, rubs or gallops  GI- soft, NT, ND, + BS Extremities- no clubbing, cyanosis. 1+ edema bilaterally.  MS- no significant deformity or atrophy Skin- no rash or lesion Psych- euthymic mood, full affect Neuro- strength and sensation are intact  Wt Readings from Last 3 Encounters:  12/10/18 84.4 kg  11/30/18 85 kg  11/26/18 83.9 kg    EKG today demonstrates afib HR 107, QRS 88, QTc 477  Echo 11/06/18 demonstrated  1. The left ventricle has low normal systolic function, with an ejection fraction of 50-55%. The cavity size was mildly dilated. Left ventricular diastolic Doppler parameters are indeterminate.  2. Extremely poor acoustic windows limit study.  3. The right ventricle was not well visualized. The cavity was moderately enlarged. There is not assessed.  4. RV is not well seen RVEF is at least mildly depressed.  5. Left atrial size was severely dilated.  6. Right atrial size was severely dilated.  7. The mitral valve is abnormal. Mild thickening of the mitral valve leaflet. There is mild mitral annular calcification present.  8. The tricuspid valve is grossly normal.  9. The aortic valve is abnormal. Mild thickening of the aortic valve. Mild calcification of the aortic valve. Aortic valve regurgitation is trivial by color flow Doppler. 10. The inferior vena cava was dilated in size with <50% respiratory variability.  Epic records are  reviewed at length today  Assessment and Plan:  1. Persistent atrial fibrillation General educaiton about afib discussed and questions answered. We discussed therapeutic options including DCCV today. After discussing the risks and benefits, patient would like to pursue DCCV at this time. We also discussed the possibility of AAD. It may be difficult to maintain SR given lung disease and severe biatrial enlargement.  Patient reports no missed doses of anticoagulation. Continue Eliquis 5 mg BID Continue diltiazem 360 mg daily Increase Toprol to 75 mg daily for better rate control.  Lifestyle changes as below.  This patients CHA2DS2-VASc Score and unadjusted Ischemic Stroke Rate (% per year) is equal to 2.2 % stroke rate/year from a score of 2  Above score calculated as 1 point each if present [CHF, HTN, DM, Vascular=MI/PAD/Aortic Plaque, Age if 65-74, or Male] Above score calculated as 2 points each if present [Age > 75, or Stroke/TIA/TE]   2. HTN Stable, med changes as above.  3. Lung Cancer Plans per oncology.   4. ETOH Encouraged him to cut back to 2 drinks per week or less.   Follow up in AF clinic one week post DCCV.   [facility] [address] [place], [place] [address] [phone] 12/10/2018 3:25 PM

## 2018-12-11 ENCOUNTER — Ambulatory Visit
Admission: RE | Admit: 2018-12-11 | Discharge: 2018-12-11 | Disposition: A | Discharge status: Home / Self Care | Payer: Medicare HMO | Source: Ambulatory Visit | Attending: Radiation Oncology | Admitting: Radiation Oncology

## 2018-12-11 ENCOUNTER — Other Ambulatory Visit: Payer: Self-pay

## 2018-12-11 ENCOUNTER — Telehealth (HOSPITAL_COMMUNITY): Payer: Self-pay | Admitting: *Deleted

## 2018-12-11 DIAGNOSIS — Z510 Encounter for antineoplastic radiation therapy: Secondary | ICD-10-CM | POA: Diagnosis not present

## 2018-12-11 DIAGNOSIS — C3431 Malignant neoplasm of lower lobe, right bronchus or lung: Secondary | ICD-10-CM | POA: Diagnosis not present

## 2018-12-11 DIAGNOSIS — Z87891 Personal history of nicotine dependence: Secondary | ICD-10-CM | POA: Diagnosis not present

## 2018-12-11 NOTE — Telephone Encounter (Signed)
-----   Message from Oliver Barre, [place] sent at 12/11/2018  8:21 AM EDT ----- Regarding: DCCV Can we get Mr Heppler set up for cardioversion? He shouldn't need labs because he gets them every week at his oncology visits.  Thanks

## 2018-12-11 NOTE — Telephone Encounter (Signed)
Left a msg for pt to clbk to sched dccv

## 2018-12-14 ENCOUNTER — Inpatient Hospital Stay: Payer: Medicare HMO

## 2018-12-14 ENCOUNTER — Other Ambulatory Visit: Payer: Self-pay

## 2018-12-14 ENCOUNTER — Ambulatory Visit: Payer: Medicare HMO | Admitting: Pulmonary Disease

## 2018-12-14 ENCOUNTER — Ambulatory Visit
Admission: RE | Admit: 2018-12-14 | Discharge: 2018-12-14 | Disposition: A | Discharge status: Home / Self Care | Payer: Medicare HMO | Source: Ambulatory Visit | Attending: Radiation Oncology | Admitting: Radiation Oncology

## 2018-12-14 ENCOUNTER — Inpatient Hospital Stay (HOSPITAL_BASED_OUTPATIENT_CLINIC_OR_DEPARTMENT_OTHER): Payer: Medicare HMO | Admitting: Internal Medicine

## 2018-12-14 ENCOUNTER — Encounter: Payer: Self-pay | Admitting: Internal Medicine

## 2018-12-14 VITALS — BP 117/79 | HR 53 | Temp 98.2°F | Resp 18 | Ht 73.0 in | Wt 181.9 lb

## 2018-12-14 DIAGNOSIS — Z510 Encounter for antineoplastic radiation therapy: Secondary | ICD-10-CM | POA: Diagnosis not present

## 2018-12-14 DIAGNOSIS — I4891 Unspecified atrial fibrillation: Secondary | ICD-10-CM | POA: Diagnosis not present

## 2018-12-14 DIAGNOSIS — Z87891 Personal history of nicotine dependence: Secondary | ICD-10-CM | POA: Diagnosis not present

## 2018-12-14 DIAGNOSIS — Z5111 Encounter for antineoplastic chemotherapy: Secondary | ICD-10-CM | POA: Diagnosis not present

## 2018-12-14 DIAGNOSIS — C3431 Malignant neoplasm of lower lobe, right bronchus or lung: Secondary | ICD-10-CM

## 2018-12-14 DIAGNOSIS — Z7901 Long term (current) use of anticoagulants: Secondary | ICD-10-CM | POA: Diagnosis not present

## 2018-12-14 DIAGNOSIS — C3491 Malignant neoplasm of unspecified part of right bronchus or lung: Secondary | ICD-10-CM | POA: Diagnosis not present

## 2018-12-14 DIAGNOSIS — Z79899 Other long term (current) drug therapy: Secondary | ICD-10-CM | POA: Diagnosis not present

## 2018-12-14 DIAGNOSIS — D696 Thrombocytopenia, unspecified: Secondary | ICD-10-CM | POA: Diagnosis not present

## 2018-12-14 DIAGNOSIS — D709 Neutropenia, unspecified: Secondary | ICD-10-CM | POA: Diagnosis not present

## 2018-12-14 LAB — CBC WITH DIFFERENTIAL (CANCER CENTER ONLY)
Abs Immature Granulocytes: 0.01 10*3/uL (ref 0.00–0.07)
Basophils Absolute: 0 10*3/uL (ref 0.0–0.1)
Basophils Relative: 2 %
Eosinophils Absolute: 0 10*3/uL (ref 0.0–0.5)
Eosinophils Relative: 2 %
HCT: 35.5 % — ABNORMAL LOW (ref 39.0–52.0)
Hemoglobin: 12.3 g/dL — ABNORMAL LOW (ref 13.0–17.0)
Immature Granulocytes: 1 %
Lymphocytes Relative: 26 %
Lymphs Abs: 0.5 10*3/uL — ABNORMAL LOW (ref 0.7–4.0)
MCH: 37 pg — ABNORMAL HIGH (ref 26.0–34.0)
MCHC: 34.6 g/dL (ref 30.0–36.0)
MCV: 106.9 fL — ABNORMAL HIGH (ref 80.0–100.0)
Monocytes Absolute: 0.2 10*3/uL (ref 0.1–1.0)
Monocytes Relative: 9 %
Neutro Abs: 1.1 10*3/uL — ABNORMAL LOW (ref 1.7–7.7)
Neutrophils Relative %: 60 %
Platelet Count: 167 10*3/uL (ref 150–400)
RBC: 3.32 MIL/uL — ABNORMAL LOW (ref 4.22–5.81)
RDW: 13.9 % (ref 11.5–15.5)
WBC Count: 1.9 10*3/uL — ABNORMAL LOW (ref 4.0–10.5)
nRBC: 0 % (ref 0.0–0.2)

## 2018-12-14 LAB — CMP (CANCER CENTER ONLY)
ALT: 15 U/L (ref 0–44)
AST: 14 U/L — ABNORMAL LOW (ref 15–41)
Albumin: 3.3 g/dL — ABNORMAL LOW (ref 3.5–5.0)
Alkaline Phosphatase: 68 U/L (ref 38–126)
Anion gap: 9 (ref 5–15)
BUN: 13 mg/dL (ref 8–23)
CO2: 21 mmol/L — ABNORMAL LOW (ref 22–32)
Calcium: 8.8 mg/dL — ABNORMAL LOW (ref 8.9–10.3)
Chloride: 104 mmol/L (ref 98–111)
Creatinine: 0.77 mg/dL (ref 0.61–1.24)
GFR, Est AFR Am: >60 mL/min (ref >60)
GFR, Estimated: >60 mL/min (ref >60)
Glucose, Bld: 94 mg/dL (ref 70–99)
Potassium: 4.8 mmol/L (ref 3.5–5.1)
Sodium: 134 mmol/L — ABNORMAL LOW (ref 135–145)
Total Bilirubin: 0.5 mg/dL (ref 0.3–1.2)
Total Protein: 6.4 g/dL — ABNORMAL LOW (ref 6.5–8.1)

## 2018-12-14 MED ORDER — SODIUM CHLORIDE 0.9 % IV SOLN
214.8000 mg | Freq: Once | INTRAVENOUS | Status: AC
  Administered 2018-12-14: 210 mg via INTRAVENOUS
  Filled 2018-12-14: qty 21

## 2018-12-14 MED ORDER — SODIUM CHLORIDE 0.9 % IV SOLN
45.0000 mg/m2 | Freq: Once | INTRAVENOUS | Status: AC
  Administered 2018-12-14: 90 mg via INTRAVENOUS
  Filled 2018-12-14: qty 15

## 2018-12-14 MED ORDER — DIPHENHYDRAMINE HCL 50 MG/ML IJ SOLN
50.0000 mg | Freq: Once | INTRAMUSCULAR | Status: AC
  Administered 2018-12-14: 50 mg via INTRAVENOUS

## 2018-12-14 MED ORDER — SODIUM CHLORIDE 0.9 % IV SOLN
20.0000 mg | Freq: Once | INTRAVENOUS | Status: AC
  Administered 2018-12-14: 20 mg via INTRAVENOUS
  Filled 2018-12-14: qty 2

## 2018-12-14 MED ORDER — FAMOTIDINE IN NACL 20-0.9 MG/50ML-% IV SOLN
INTRAVENOUS | Status: AC
  Filled 2018-12-14: qty 50

## 2018-12-14 MED ORDER — FAMOTIDINE IN NACL 20-0.9 MG/50ML-% IV SOLN
20.0000 mg | Freq: Once | INTRAVENOUS | Status: AC
  Administered 2018-12-14: 20 mg via INTRAVENOUS

## 2018-12-14 MED ORDER — PALONOSETRON HCL INJECTION 0.25 MG/5ML
0.2500 mg | Freq: Once | INTRAVENOUS | Status: AC
  Administered 2018-12-14: 0.25 mg via INTRAVENOUS

## 2018-12-14 MED ORDER — SODIUM CHLORIDE 0.9 % IV SOLN
Freq: Once | INTRAVENOUS | Status: AC
  Administered 2018-12-14: 13:00:00 via INTRAVENOUS
  Filled 2018-12-14: qty 250

## 2018-12-14 MED ORDER — DIPHENHYDRAMINE HCL 50 MG/ML IJ SOLN
INTRAMUSCULAR | Status: AC
  Filled 2018-12-14: qty 1

## 2018-12-14 MED ORDER — PALONOSETRON HCL INJECTION 0.25 MG/5ML
INTRAVENOUS | Status: AC
  Filled 2018-12-14: qty 5

## 2018-12-14 NOTE — Progress Notes (Signed)
Ok to treat with [place] result today per MD [facility]

## 2018-12-14 NOTE — Progress Notes (Signed)
[name] [phone]   [phone]  OFFICE PROGRESS NOTE  [name], MD [place] [address]  DIAGNOSIS: Stage IIIA (T3, N1, M0) non-small cell lung cancer, squamous cell carcinoma diagnosed in July 2020 and presented with right middle lobe mass and right hilar adenopathy with postobstructive pneumonia and suspicious right pleural effusion.  PRIOR THERAPY: None  CURRENT THERAPY: Weekly concurrent chemoradiation with Carboplatin for an AUC of 2 and paclitaxel 45 mg/m2. First dose 11/23/2018. Status post 3 cycles.   INTERVAL HISTORY: Joe Cole 68 y.o. male returns to the clinic today for follow-up visit.  The patient is feeling fine today with no concerning complaints.  He is tolerating his treatment with chemotherapy and radiation fairly well.  He denied having any nausea, vomiting, diarrhea or constipation.  He denied having any headache or visual changes.  He has no chest pain, shortness of breath, cough or hemoptysis.  He denied having any dysphagia or odynophagia.  He is here today for evaluation before starting cycle #4.  MEDICAL HISTORY: Past Medical History:  Diagnosis Date  . Arthritis   . Atrial fibrillation ([place]) 10/2018  . COPD with emphysema ([place])   . Dyspnea   . Hypertension     ALLERGIES:  has No Known Allergies.  MEDICATIONS:  Current Outpatient Medications  Medication Sig Dispense Refill  . apixaban (ELIQUIS) 5 MG TABS tablet Take 1 tablet (5 mg total) by mouth 2 (two) times daily. 60 tablet 3  . atorvastatin (LIPITOR) 20 MG tablet Take 20 mg by mouth daily.    Marland Kitchen diltiazem (CARDIZEM CD) 360 MG 24 hr capsule Take 1 capsule (360 mg total) by mouth daily. 30 capsule 3  . docusate sodium (COLACE) 100 MG capsule Take 1 capsule (100 mg total) by mouth 2 (two) times daily. 10 capsule 0  . metoprolol succinate (TOPROL XL) 25 MG 24 hr tablet Take 1 tablet (25 mg total) by mouth at bedtime.  30 tablet 11  . metoprolol succinate (TOPROL XL) 50 MG 24 hr tablet Take 1 tablet (50 mg total) by mouth at bedtime. Take with or immediately following a meal. 30 tablet 11  . Probiotic Product (ALIGN) 4 MG CAPS Take 4 mg by mouth daily.    . traZODone (DESYREL) 50 MG tablet Take 1 tablet (50 mg total) by mouth at bedtime. 30 tablet 2  . umeclidinium-vilanterol (ANORO ELLIPTA) 62.5-25 MCG/INH AEPB Inhale 1 puff into the lungs daily. 1 each 6  . albuterol (PROAIR HFA) 108 (90 Base) MCG/ACT inhaler Inhale 2 puffs into the lungs every 6 (six) hours as needed for wheezing or shortness of breath. (Patient not taking: Reported on 12/14/2018) 1 Inhaler 5  . diphenhydramine-acetaminophen (TYLENOL PM) 25-500 MG TABS tablet Take 2 tablets by mouth at bedtime as needed (sleep).     . prochlorperazine (COMPAZINE) 10 MG tablet Take 1 tablet (10 mg total) by mouth every 6 (six) hours as needed for nausea or vomiting. (Patient not taking: Reported on 12/14/2018) 30 tablet 0   No current facility-administered medications for this visit.     SURGICAL HISTORY:  Past Surgical History:  Procedure Laterality Date  . KNEE ARTHROSCOPY     BIL   . TOE SURGERY     PINNED LEFT BIG TOE  . TONSILLECTOMY     T+A  AS CHILD  . TOTAL KNEE ARTHROPLASTY Left 08/19/2017   Procedure: TOTAL KNEE ARTHROPLASTY;  Surgeon: Edmonia Lynch  D, MD;  Location: [facility];  Service: Orthopedics;  Laterality: Left;  Marland Kitchen VIDEO BRONCHOSCOPY Bilateral 11/05/2018   Procedure: VIDEO BRONCHOSCOPY WITH FLUORO;  Surgeon: [name], MD;  Location: [facility] ENDOSCOPY;  Service: Cardiopulmonary;  Laterality: Bilateral;  . VIDEO BRONCHOSCOPY Bilateral 11/09/2018   Procedure: VIDEO BRONCHOSCOPY WITH FLUORO;  Surgeon: Rigoberto Noel, MD;  Location: [facility];  Service: Cardiopulmonary;  Laterality: Bilateral;    REVIEW OF SYSTEMS:  A comprehensive review of systems was negative except for: Constitutional: positive for fatigue   PHYSICAL EXAMINATION: General  appearance: alert, cooperative, fatigued and no distress Head: Normocephalic, without obvious abnormality, atraumatic Neck: no adenopathy, no JVD, supple, symmetrical, trachea midline and thyroid not enlarged, symmetric, no tenderness/mass/nodules Lymph nodes: Cervical, supraclavicular, and axillary nodes normal. Resp: clear to auscultation bilaterally Back: symmetric, no curvature. ROM normal. No CVA tenderness. Cardio: regular rate and rhythm, S1, S2 normal, no murmur, click, rub or gallop GI: soft, non-tender; bowel sounds normal; no masses,  no organomegaly Extremities: extremities normal, atraumatic, no cyanosis or edema  ECOG PERFORMANCE STATUS: 1 - Symptomatic but completely ambulatory  Blood pressure 117/79, pulse (!) 53, temperature 98.2 F (36.8 C), temperature source Oral, resp. rate 18, height 6\' 1"  (1.854 m), weight 181 lb 14.4 oz (82.5 kg), SpO2 98 %.  LABORATORY DATA: Lab Results  Component Value Date   WBC 1.9 (L) 12/14/2018   HGB 12.3 (L) 12/14/2018   HCT 35.5 (L) 12/14/2018   MCV 106.9 (H) 12/14/2018   PLT 167 12/14/2018      Chemistry      Component Value Date/Time   NA 134 (L) 12/14/2018 1048   K 4.8 12/14/2018 1048   CL 104 12/14/2018 1048   CO2 21 (L) 12/14/2018 1048   BUN 13 12/14/2018 1048   CREATININE 0.77 12/14/2018 1048      Component Value Date/Time   CALCIUM 8.8 (L) 12/14/2018 1048   ALKPHOS 68 12/14/2018 1048   AST 14 (L) 12/14/2018 1048   ALT 15 12/14/2018 1048   BILITOT 0.5 12/14/2018 1048       RADIOGRAPHIC STUDIES: Mr Jeri Cos PO Contrast  Result Date: 12/02/2018 CLINICAL DATA:  Malignant neoplasm of unspecified part of bronchus or lung. Non-small cell lung cancer. Staging. EXAM: MRI HEAD WITHOUT AND WITH CONTRAST TECHNIQUE: Multiplanar, multiecho pulse sequences of the brain and surrounding structures were obtained without and with intravenous contrast. CONTRAST:  10 ML GADAVIST COMPARISON:  None. FINDINGS: Brain: No acute infarct,  hemorrhage, or mass lesion is present. Postcontrast images demonstrate no pathologic enhancement. Atrophy and white matter changes are mildly advanced for age. The ventricles are proportionate to the degree of atrophy. No significant extraaxial fluid collection is present. The internal auditory canals are within normal limits. White matter changes extend into the brainstem. The brainstem and cerebellum are otherwise unremarkable. Vascular: Flow is present in the major intracranial arteries. Skull and upper cervical spine: The craniocervical junction is normal. Upper cervical spine is within normal limits. Marrow signal is unremarkable. Sinuses/Orbits: The paranasal sinuses and mastoid air cells are clear. The globes and orbits are within normal limits. IMPRESSION: 1. No evidence for metastatic disease to the brain or meninges. 2. Atrophy and white matter changes are mildly advanced for age. This is nonspecific, likely reflects the sequela of chronic microvascular ischemia. Electronically Signed   By: San Morelle M.D.   On: 12/02/2018 10:36    ASSESSMENT AND PLAN: This is a very pleasant 68 years old white male with stage IIIa  non-small cell lung cancer, squamous cell carcinoma.  The patient is currently undergoing a course of concurrent chemoradiation with weekly carboplatin and paclitaxel status post 3 cycles. He continues to tolerate this treatment well. I recommended for the patient to proceed with cycle #4 today as planned. I will see him back for follow-up visit in 2 weeks for evaluation before starting cycle #6. The patient was advised to call immediately if she has any concerning symptoms in the interval. The patient voices understanding of current disease status and treatment options and is in agreement with the current care plan.  All questions were answered. The patient knows to call the clinic with any problems, questions or concerns. We can certainly see the patient much sooner if  necessary.  I spent 10 minutes counseling the patient face to face. The total time spent in the appointment was 15 minutes.  Disclaimer: This note was dictated with voice recognition software. Similar sounding words can inadvertently be transcribed and may not be corrected upon review.

## 2018-12-14 NOTE — Patient Instructions (Signed)
[facility] Discharge Instructions for Patients Receiving Chemotherapy  Today you received the following chemotherapy agents Taxol, Carboplatin  To help prevent nausea and vomiting after your treatment, we encourage you to take your nausea medication as directed  If you develop nausea and vomiting that is not controlled by your nausea medication, call the clinic.   BELOW ARE SYMPTOMS THAT SHOULD BE REPORTED IMMEDIATELY:  *FEVER GREATER THAN 100.5 F  *CHILLS WITH OR WITHOUT FEVER  NAUSEA AND VOMITING THAT IS NOT CONTROLLED WITH YOUR NAUSEA MEDICATION  *UNUSUAL SHORTNESS OF BREATH  *UNUSUAL BRUISING OR BLEEDING  TENDERNESS IN MOUTH AND THROAT WITH OR WITHOUT PRESENCE OF ULCERS  *URINARY PROBLEMS  *BOWEL PROBLEMS  UNUSUAL RASH Items with * indicate a potential emergency and should be followed up as soon as possible.  Feel free to call the clinic should you have any questions or concerns. The clinic phone number is [phone].  Please show the CHEMO ALERT CARD at check-in to the Emergency Department and triage nurse.   

## 2018-12-15 ENCOUNTER — Ambulatory Visit
Admission: RE | Admit: 2018-12-15 | Discharge: 2018-12-15 | Disposition: A | Discharge status: Home / Self Care | Payer: Medicare HMO | Source: Ambulatory Visit | Attending: Radiation Oncology | Admitting: Radiation Oncology

## 2018-12-15 ENCOUNTER — Other Ambulatory Visit: Payer: Self-pay

## 2018-12-15 DIAGNOSIS — Z87891 Personal history of nicotine dependence: Secondary | ICD-10-CM | POA: Diagnosis not present

## 2018-12-15 DIAGNOSIS — C3431 Malignant neoplasm of lower lobe, right bronchus or lung: Secondary | ICD-10-CM | POA: Diagnosis not present

## 2018-12-15 DIAGNOSIS — Z510 Encounter for antineoplastic radiation therapy: Secondary | ICD-10-CM | POA: Diagnosis not present

## 2018-12-15 NOTE — Progress Notes (Signed)
@Patient  ID: Joe Cole, male    DOB: 1950-12-21, 68 y.o.   MRN: [id number]  Chief Complaint  Patient presents with   Follow-up    SOB about the same - edema in legs some better - trazodone helping sleep    Referring provider: [name], MD  HPI:  68 y.o. male number smoker with COPD/emphysema.    PMH: Stage III squamous cell carcinoma of right lung (followed by Dr. Earlie Server), hypertension, hyperlipidemia, A. Fib (followed by Dr. Marlou Porch) Smoking History: Former Smoker, quit 09/08/2018, 69 pack year smoker. Maintenance: Celedonio Miyamoto  Patient of Dr. Halford Chessman  12/17/2018  - Visit   68 year old male former smoker followed in our office for COPD and emphysema.  Patient completed some tele-visits in the spring during the COVID-19 pandemic and chest x-ray revealed a right mass.  Patient then underwent a bronchoscopy and on arrival for bronchoscopy was found to be in A. fib flutter.  Patient was admitted to the hospital.  While inpatient patient had a bronchoscopy which revealed squamous cell carcinoma of the right lung mass.  Patient is now followed by Dr. Earlie Server.  Patient is currently undergoing a course of concurrent chemoradiation with weekly carboplatin and paclitaxel status post 3 cycles.  Per oncology note patient is tolerating this well.  Patient reports today that he is tolerating treatment well.  He is also following up with radiation oncology.    Patient continues to be maintained on Anoro Ellipta.  Patient does not feel the Anoro Ellipta helps him at all.  He still has shortness of breath.  Patient believes this is likely more related to his A. fib.  Patient believes he is still in A. fib.  Plans per patient after cardiology outpatient visit is for him to be scheduled for cardioversion.  They have not heard anything regarding scheduling patient for cardioversion.  Patient remains maintained on Eliquis.  They are struggling with the cost of Eliquis monthly.  They are wondering  if there is any sort of assistance that could be provided for them.    mMRC Dyspnea Scale mMRC Score  12/17/2018 3    Tests:   PFT 05/04/18 >> FEV1 2.35 (64%), FEV1% 59, TLC 9.00 (121%), RV 4.89 (197%), DLCO 60%  A1AT level 05/04/18 >> 167, MM  10/19/2018-chest x-ray-consolidation atelectasis in right middle lobe with tiny right effusion this could represent pneumonia however possibility of an obstructing mass in the right middle lobe bronchus should be considered as well  10/20/2018-CTA chest- no PE, apparent mass arising from right hilum with apparent tumor surrounding the proximal right middle and lower lobe bronchi, there is airspace consolidation throughout the medial segment right middle lobe to a lesser extent patchy airspace consolidation of portions of the right lower lobe, there is felt to be adenopathy in the inferior hilum on the right measuring 2.2 x 1.8 cm  10/19/2018-d-dimer-0.98  09/08/2018-SARS-CoV-2 test-negative  11/05/2018- hospitalization-A. fib/flutter Patient was scheduled for planned bronchoscopy on 11/04/2020 right lung mass was PET positive and found to be in new A. fib/flutter, patient was admitted and cardiology was consulted. Patient underwent bronchoscopy on 11/09/2018 with Dr. Elsworth Soho.  Pathology showed malignant cells consistent with squamous cell carcinoma.  Patient was referred to radiation oncology with Dr. Tammi Klippel. Discharge on 11/09/2018 maintained on Eliquis as well as diltiazem for new A. fib.  Patient had PET scan 10/29/18 which showed hypermetabolic mass right hila with postobstructive collapse RML.  >>>Findings consistent with metastatic bronchogenic carcinoma.   FENO:  No  results found for: NITRICOXIDE  PFT: PFT Results Latest Ref Rng & Units 05/04/2018  FVC-Pre L 3.98  FVC-Predicted Pre % 81  Pre FEV1/FVC % % 59  FEV1-Pre L 2.35  FEV1-Predicted Pre % 64  DLCO UNC% % 60  DLCO COR %Predicted % 70  TLC L 9.00  TLC % Predicted % 121  RV % Predicted %  197    Imaging: Mr Jeri Cos Wo Contrast  Result Date: 12/02/2018 CLINICAL DATA:  Malignant neoplasm of unspecified part of bronchus or lung. Non-small cell lung cancer. Staging. EXAM: MRI HEAD WITHOUT AND WITH CONTRAST TECHNIQUE: Multiplanar, multiecho pulse sequences of the brain and surrounding structures were obtained without and with intravenous contrast. CONTRAST:  10 ML GADAVIST COMPARISON:  None. FINDINGS: Brain: No acute infarct, hemorrhage, or mass lesion is present. Postcontrast images demonstrate no pathologic enhancement. Atrophy and white matter changes are mildly advanced for age. The ventricles are proportionate to the degree of atrophy. No significant extraaxial fluid collection is present. The internal auditory canals are within normal limits. White matter changes extend into the brainstem. The brainstem and cerebellum are otherwise unremarkable. Vascular: Flow is present in the major intracranial arteries. Skull and upper cervical spine: The craniocervical junction is normal. Upper cervical spine is within normal limits. Marrow signal is unremarkable. Sinuses/Orbits: The paranasal sinuses and mastoid air cells are clear. The globes and orbits are within normal limits. IMPRESSION: 1. No evidence for metastatic disease to the brain or meninges. 2. Atrophy and white matter changes are mildly advanced for age. This is nonspecific, likely reflects the sequela of chronic microvascular ischemia. Electronically Signed   By: San Morelle M.D.   On: 12/02/2018 10:36      Specialty Problems      Pulmonary Problems   COPD mixed type ([place])    PFT 05/04/18 >> FEV1 2.35 (64%), FEV1% 59, TLC 9.00 (121%), RV 4.89 (197%), DLCO 60%  A1AT level 05/04/18 >> 167, MM      Shortness of breath   Right lower lobe lung mass   Primary cancer of right lower lobe of lung ([facility])   Stage III squamous cell carcinoma of right lung ([facility])      No Known Allergies  Immunization History  Administered  Date(s) Administered   Influenza, High Dose Seasonal PF 05/04/2018   Pneumococcal Polysaccharide-23 05/04/2018    Past Medical History:  Diagnosis Date   Arthritis    Atrial fibrillation ([place]) 10/2018   COPD with emphysema ([facility])    Dyspnea    Hypertension     Tobacco History: Social History   Tobacco Use  Smoking Status Former Smoker   Packs/day: 1.50   Years: 46.00   Pack years: 69.00   Types: Cigarettes   Quit date: 09/08/2018   Years since quitting: 0.2  Smokeless Tobacco Never Used   Counseling given: Yes   Continue to not smoke  Outpatient Encounter Medications as of 12/17/2018  Medication Sig   albuterol (PROAIR HFA) 108 (90 Base) MCG/ACT inhaler Inhale 2 puffs into the lungs every 6 (six) hours as needed for wheezing or shortness of breath.   apixaban (ELIQUIS) 5 MG TABS tablet Take 1 tablet (5 mg total) by mouth 2 (two) times daily.   atorvastatin (LIPITOR) 20 MG tablet Take 20 mg by mouth daily.   diltiazem (CARDIZEM CD) 360 MG 24 hr capsule Take 1 capsule (360 mg total) by mouth daily.   diphenhydramine-acetaminophen (TYLENOL PM) 25-500 MG TABS tablet Take 2 tablets by mouth  at bedtime as needed (sleep).    docusate sodium (COLACE) 100 MG capsule Take 1 capsule (100 mg total) by mouth 2 (two) times daily.   metoprolol succinate (TOPROL XL) 25 MG 24 hr tablet Take 1 tablet (25 mg total) by mouth at bedtime.   metoprolol succinate (TOPROL XL) 50 MG 24 hr tablet Take 1 tablet (50 mg total) by mouth at bedtime. Take with or immediately following a meal.   Probiotic Product (ALIGN) 4 MG CAPS Take 4 mg by mouth daily.   prochlorperazine (COMPAZINE) 10 MG tablet Take 1 tablet (10 mg total) by mouth every 6 (six) hours as needed for nausea or vomiting.   traZODone (DESYREL) 50 MG tablet Take 1 tablet (50 mg total) by mouth at bedtime.   umeclidinium-vilanterol (ANORO ELLIPTA) 62.5-25 MCG/INH AEPB Inhale 1 puff into the lungs daily.   No  facility-administered encounter medications on file as of 12/17/2018.      Review of Systems  Review of Systems  Constitutional: Positive for fatigue. Negative for activity change, chills, fever and unexpected weight change.  HENT: Negative for postnasal drip, rhinorrhea, sinus pressure, sinus pain and sore throat.   Eyes: Negative.   Respiratory: Positive for shortness of breath. Negative for cough and wheezing.   Cardiovascular: Positive for leg swelling. Negative for chest pain and palpitations.  Gastrointestinal: Negative for constipation, diarrhea, nausea and vomiting.  Endocrine: Negative.   Genitourinary: Negative.   Musculoskeletal: Negative.   Skin: Negative.   Neurological: Negative for dizziness and headaches.  Psychiatric/Behavioral: Negative.  Negative for dysphoric mood. The patient is not nervous/anxious.   All other systems reviewed and are negative.    Physical Exam  BP 118/60 (BP Location: Left Arm, Patient Position: Sitting, Cuff Size: Normal)    Pulse 67    Temp (!) 97.4 F (36.3 C)    Ht 6\' 1"  (1.854 m)    Wt 182 lb 12.8 oz (82.9 kg)    SpO2 97%    BMI 24.12 kg/m   Wt Readings from Last 5 Encounters:  12/17/18 182 lb 12.8 oz (82.9 kg)  12/14/18 181 lb 14.4 oz (82.5 kg)  12/10/18 186 lb (84.4 kg)  11/30/18 187 lb 4.8 oz (85 kg)  11/26/18 185 lb (83.9 kg)     Physical Exam Vitals signs and nursing note reviewed.  Constitutional:      General: He is not in acute distress.    Appearance: Normal appearance. He is normal weight.     Comments: Chronically ill adult male  HENT:     Head: Normocephalic and atraumatic.     Right Ear: Hearing, tympanic membrane, ear canal and external ear normal.     Left Ear: Hearing, tympanic membrane, ear canal and external ear normal.     Nose: Nose normal. No mucosal edema, congestion or rhinorrhea.     Right Turbinates: Not enlarged.     Left Turbinates: Not enlarged.     Mouth/Throat:     Mouth: Mucous membranes are  dry.     Pharynx: Oropharynx is clear. No oropharyngeal exudate.  Eyes:     Pupils: Pupils are equal, round, and reactive to light.  Neck:     Musculoskeletal: Normal range of motion.  Cardiovascular:     Rate and Rhythm: Normal rate. Rhythm regularly irregular.     Pulses: Normal pulses.     Heart sounds: Normal heart sounds. No murmur.  Pulmonary:     Effort: Pulmonary effort is normal. No respiratory distress.  Breath sounds: Normal breath sounds. No decreased breath sounds, wheezing, rhonchi or rales.  Abdominal:     General: Bowel sounds are normal. There is no distension.     Palpations: Abdomen is soft.     Tenderness: There is no abdominal tenderness.  Musculoskeletal:     Right lower leg: Edema (2+) present.     Left lower leg: Edema (3+ pitting edema) present.  Lymphadenopathy:     Cervical: No cervical adenopathy.  Skin:    General: Skin is warm and dry.     Capillary Refill: Capillary refill takes less than 2 seconds.     Findings: No erythema or rash.  Neurological:     General: No focal deficit present.     Mental Status: He is alert and oriented to person, place, and time.     Motor: No weakness.     Coordination: Coordination normal.     Gait: Gait is intact. Gait normal.  Psychiatric:        Mood and Affect: Mood normal.        Behavior: Behavior normal. Behavior is cooperative.        Thought Content: Thought content normal.        Judgment: Judgment normal.      Lab Results:  CBC    Component Value Date/Time   WBC 1.9 (L) 12/14/2018 1048   WBC 5.3 11/09/2018 0556   RBC 3.32 (L) 12/14/2018 1048   HGB 12.3 (L) 12/14/2018 1048   HCT 35.5 (L) 12/14/2018 1048   PLT 167 12/14/2018 1048   MCV 106.9 (H) 12/14/2018 1048   MCH 37.0 (H) 12/14/2018 1048   MCHC 34.6 12/14/2018 1048   RDW 13.9 12/14/2018 1048   LYMPHSABS 0.5 (L) 12/14/2018 1048   MONOABS 0.2 12/14/2018 1048   EOSABS 0.0 12/14/2018 1048   BASOSABS 0.0 12/14/2018 1048    BMET      Component Value Date/Time   NA 134 (L) 12/14/2018 1048   K 4.8 12/14/2018 1048   CL 104 12/14/2018 1048   CO2 21 (L) 12/14/2018 1048   GLUCOSE 94 12/14/2018 1048   BUN 13 12/14/2018 1048   CREATININE 0.77 12/14/2018 1048   CALCIUM 8.8 (L) 12/14/2018 1048   GFRNONAA >60 12/14/2018 1048   GFRAA >60 12/14/2018 1048    BNP No results found for: BNP  ProBNP No results found for: PROBNP    Assessment & Plan:   Atrial fibrillation ([place]) Suspect this is large component of patient's current shortness of breath.  Plan: Will route note today to Dr. Marlou Porch Continue Eliquis  Continue diltiazem Encouraged patient to also contact cardiology to follow-up on getting scheduled for cardioversion  COPD mixed type ([place]) mMRC 3 today January/2020 pulmonary function test -spirometry with DLCO- obstruction with FEV1 of 59% shows DLCO 60% Patient does not feel that Anoro helps him clinically  Plan: Patient can trial coming off of Anoro Ellipta to see how he tolerates this Continue to remain as active as possible We will keep close follow-up with the patient If patient starts to have increased shortness of breath after stopping Anoro Ellipta please restart Anoro Ellipta and contact our office  Right lower lobe lung mass Plan: Follow-up with oncology Follow-up with radiation oncology  Shortness of breath Shortness of breath is likely multifactorial as patient has poorly controlled A. fib, right lung mass, currently undergoing chemotherapy, and known COPD and emphysema with January 2020 DLCO being 60%.  Plan: I suspect that the largest component of  shortness of breath is likely related to the A. fib I hope the patient will be able to get scheduled with cardiology soon for cardioversion   Medication management Plan: [facility]erral [facility]with medication cost specifically Eliquis  Former smoker Plan: Continue to not smoke.    Return in about 2 months (around  02/16/2019), or if symptoms worsen or fail to improve, for Follow up with Dr. Halford Chessman.   Lauraine Rinne, NP 12/17/2018   This appointment was 28 minutes long with over 50% of the time in direct face-to-face patient care, assessment, plan of care, and follow-up.

## 2018-12-16 ENCOUNTER — Ambulatory Visit
Admission: RE | Admit: 2018-12-16 | Discharge: 2018-12-16 | Disposition: A | Discharge status: Home / Self Care | Payer: Medicare HMO | Source: Ambulatory Visit | Attending: Radiation Oncology | Admitting: Radiation Oncology

## 2018-12-16 ENCOUNTER — Other Ambulatory Visit: Payer: Self-pay

## 2018-12-16 DIAGNOSIS — Z510 Encounter for antineoplastic radiation therapy: Secondary | ICD-10-CM | POA: Diagnosis not present

## 2018-12-16 DIAGNOSIS — Z87891 Personal history of nicotine dependence: Secondary | ICD-10-CM | POA: Diagnosis not present

## 2018-12-16 DIAGNOSIS — C3431 Malignant neoplasm of lower lobe, right bronchus or lung: Secondary | ICD-10-CM | POA: Diagnosis not present

## 2018-12-16 NOTE — Progress Notes (Signed)
Reviewed and agree with assessment/plan.   Verlee Pope, MD [place] [facility] 04/24/2016, 12:24 PM Pager:  [phone]  

## 2018-12-17 ENCOUNTER — Ambulatory Visit: Payer: Medicare HMO | Admitting: Pulmonary Disease

## 2018-12-17 ENCOUNTER — Encounter: Payer: Self-pay | Admitting: Pulmonary Disease

## 2018-12-17 ENCOUNTER — Ambulatory Visit
Admission: RE | Admit: 2018-12-17 | Discharge: 2018-12-17 | Disposition: A | Discharge status: Home / Self Care | Payer: Medicare HMO | Source: Ambulatory Visit | Attending: Radiation Oncology | Admitting: Radiation Oncology

## 2018-12-17 ENCOUNTER — Other Ambulatory Visit: Payer: Self-pay

## 2018-12-17 VITALS — BP 118/60 | HR 67 | Temp 97.4°F | Ht 73.0 in | Wt 182.8 lb

## 2018-12-17 DIAGNOSIS — R918 Other nonspecific abnormal finding of lung field: Secondary | ICD-10-CM | POA: Diagnosis not present

## 2018-12-17 DIAGNOSIS — I4819 Other persistent atrial fibrillation: Secondary | ICD-10-CM | POA: Diagnosis not present

## 2018-12-17 DIAGNOSIS — J449 Chronic obstructive pulmonary disease, unspecified: Secondary | ICD-10-CM

## 2018-12-17 DIAGNOSIS — R0602 Shortness of breath: Secondary | ICD-10-CM | POA: Diagnosis not present

## 2018-12-17 DIAGNOSIS — C3491 Malignant neoplasm of unspecified part of right bronchus or lung: Secondary | ICD-10-CM | POA: Diagnosis not present

## 2018-12-17 DIAGNOSIS — Z79899 Other long term (current) drug therapy: Secondary | ICD-10-CM | POA: Diagnosis not present

## 2018-12-17 DIAGNOSIS — C3431 Malignant neoplasm of lower lobe, right bronchus or lung: Secondary | ICD-10-CM | POA: Diagnosis not present

## 2018-12-17 DIAGNOSIS — Z87891 Personal history of nicotine dependence: Secondary | ICD-10-CM

## 2018-12-17 DIAGNOSIS — Z510 Encounter for antineoplastic radiation therapy: Secondary | ICD-10-CM | POA: Diagnosis not present

## 2018-12-17 NOTE — Assessment & Plan Note (Signed)
Plan: Continue to not smoke 

## 2018-12-17 NOTE — Assessment & Plan Note (Addendum)
Suspect this is large component of patient's current shortness of breath.  Plan: Will route note today to Dr. Marlou Porch Continue Eliquis  Continue diltiazem Encouraged patient to also contact cardiology to follow-up on getting scheduled for cardioversion

## 2018-12-17 NOTE — Patient Outreach (Signed)
[facility]) Care Management  12/17/2018  CHOZEN LATULIPPE [birth year] [id number]   Referral Date: 12/17/2018 Referral Source: MD Referral  Referral Reason:   Needs help with Eliquis cost   Outreach Attempt: spoke with patient.  He hands phone to wife.  Spoke with wife [name].  She is able to verify HIPAA.  Discussed reason for referral.  She states with patient's medical cost mounting it is getting hard to afford Eliquis.     Social: Patient lives in the home with spouse.  Patient is independent with care and wife is there to support patient.     Conditions: Patient is currently being treated for stage 3 lung cancer.  Patient also has COPD, A. Fib., and Hyperlipidemia  Medications:  Patient takes medication as prescribed but having some problems with affording medication due to medical costs.     Appointments: Patient has several appointments for treatment for cancer.  Patient has no problems with transportation.     Advanced Directives: Patient has an advanced directive completed.     Consent: Discussed [facility] services with spouse. Right now agreeable to pharmacy services for help with medications.     Plan: RN CM will refer patient to pharmacy for medication assistance.     Jone Baseman, RN, MSN [facility] Care Management Care Management Coordinator Direct Line [phone] Toll Free: [phone]  Fax: [phone]

## 2018-12-17 NOTE — Assessment & Plan Note (Signed)
mMRC 3 today January/2020 pulmonary function test -spirometry with DLCO- obstruction with FEV1 of 59% shows DLCO 60% Patient does not feel that Anoro helps him clinically  Plan: Patient can trial coming off of Anoro Ellipta to see how he tolerates this Continue to remain as active as possible We will keep close follow-up with the patient If patient starts to have increased shortness of breath after stopping Anoro Ellipta please restart Anoro Ellipta and contact our office

## 2018-12-17 NOTE — Assessment & Plan Note (Signed)
Shortness of breath is likely multifactorial as patient has poorly controlled A. fib, right lung mass, currently undergoing chemotherapy, and known COPD and emphysema with January 2020 DLCO being 60%.  Plan: I suspect that the largest component of shortness of breath is likely related to the A. fib I hope the patient will be able to get scheduled with cardiology soon for cardioversion

## 2018-12-17 NOTE — Progress Notes (Signed)
Reviewed and agree with assessment/plan.   Ginni Eichler, MD [facility] 04/24/2016, 12:24 PM Pager:  [phone]  

## 2018-12-17 NOTE — Assessment & Plan Note (Signed)
Plan: Follow-up with oncology Follow-up with radiation oncology

## 2018-12-17 NOTE — Assessment & Plan Note (Signed)
Plan: Referral to [facility].with medication cost specifically Eliquis

## 2018-12-17 NOTE — Patient Instructions (Addendum)
You were seen today by Lauraine Rinne, NP  for:   1. Stage III squamous cell carcinoma of right lung [facility])  Continue follow-up with oncology Continue follow-up with radiation oncology  2. Right lower lobe lung mass  Continue follow-up with oncology and treatment plan outlined by them  3. COPD mixed type ([place])  Continue to increase daily physical activity  Continue Anoro Ellipta >>> If you would like to trial coming off of Anoro Ellipta and then I am okay with this, if you have shortness of breath or worsening symptoms after stopping Anoro Ellipta please contact our office and resume the inhaler  Anoro Ellipta  >>> Take 1 puff daily in the morning right when you wake up >>>Rinse your mouth out after use >>>This is a daily maintenance inhaler, NOT a rescue inhaler >>>Contact our office if you are having difficulties affording or obtaining this medication >>>It is important for you to be able to take this daily and not miss any doses    4. Persistent atrial fibrillation  Continue Eliquis Continue follow-up with cardiology  Referral to [place] network for med assistance cost   Follow Up:    Return in about 2 months (around 02/16/2019), or if symptoms worsen or fail to improve, for Follow up with Dr. Halford Chessman.   Please do your part to reduce the spread of COVID-19:      Reduce your risk of any infection  and COVID19 by using the similar precautions used for avoiding the common cold or flu:  Marland Kitchen Wash your hands often with soap and warm water for at least 20 seconds.  If soap and water are not readily available, use an alcohol-based hand sanitizer with at least 60% alcohol.  . If coughing or sneezing, cover your mouth and nose by coughing or sneezing into the elbow areas of your shirt or coat, into a tissue or into your sleeve (not your hands). Langley Gauss A MASK when in public  . Avoid shaking hands with others and consider head nods or verbal greetings only. . Avoid touching  your eyes, nose, or mouth with unwashed hands.  . Avoid close contact with people who are sick. . Avoid places or events with large numbers of people in one location, like concerts or sporting events. . If you have some symptoms but not all symptoms, continue to monitor at home and seek medical attention if your symptoms worsen. . If you are having a medical emergency, call 911.   [place] / e-Visit: [email or web address]         [facility]: [facility]: [phone]                   [facility]: [phone]     It is flu season:   >>> Best ways to protect herself from the flu: Receive the yearly flu vaccine, practice good hand hygiene washing with soap and also using hand sanitizer when available, eat a nutritious meals, get adequate rest, hydrate appropriately   Please contact the office if your symptoms worsen or you have concerns that you are not improving.   Thank you for choosing [facility] for your healthcare, and for allowing [place] to partner with you on your healthcare journey. I am thankful to be able to provide care to you today.   Wyn Quaker FNP-C

## 2018-12-18 ENCOUNTER — Other Ambulatory Visit: Payer: Self-pay

## 2018-12-18 ENCOUNTER — Other Ambulatory Visit (HOSPITAL_COMMUNITY): Payer: Self-pay | Admitting: *Deleted

## 2018-12-18 ENCOUNTER — Telehealth (HOSPITAL_COMMUNITY): Payer: Self-pay | Admitting: *Deleted

## 2018-12-18 ENCOUNTER — Encounter (HOSPITAL_COMMUNITY): Payer: Self-pay | Admitting: *Deleted

## 2018-12-18 ENCOUNTER — Other Ambulatory Visit: Payer: Self-pay | Admitting: Pharmacist

## 2018-12-18 ENCOUNTER — Ambulatory Visit
Admission: RE | Admit: 2018-12-18 | Discharge: 2018-12-18 | Disposition: A | Discharge status: Home / Self Care | Payer: Medicare HMO | Source: Ambulatory Visit | Attending: Radiation Oncology | Admitting: Radiation Oncology

## 2018-12-18 DIAGNOSIS — Z87891 Personal history of nicotine dependence: Secondary | ICD-10-CM | POA: Diagnosis not present

## 2018-12-18 DIAGNOSIS — C3431 Malignant neoplasm of lower lobe, right bronchus or lung: Secondary | ICD-10-CM | POA: Diagnosis not present

## 2018-12-18 DIAGNOSIS — Z510 Encounter for antineoplastic radiation therapy: Secondary | ICD-10-CM | POA: Diagnosis not present

## 2018-12-18 NOTE — Patient Outreach (Signed)
[facility]) Care Management  12/18/2018  Joe Cole 1950-09-10 [id number]   Patient was called regarding medication assistance with Eliquis. Unfortunately, he did not answer the phone. HIPAA compliant message was left on his voicemail.  (Patient was called on both the home and mobile numbers.)  Plan: Call patient back in 5-7 business days. Send patient an unsuccessful Economist.   Elayne Guerin, PharmD, [place] Clinical Pharmacist [phone]

## 2018-12-18 NOTE — Telephone Encounter (Signed)
Pt & wife prefer to finish daily radiation on 9/8 prior to scheduling cardioversion. DCCV set up on 9/14 as requested.

## 2018-12-19 ENCOUNTER — Other Ambulatory Visit: Payer: Self-pay | Admitting: Primary Care

## 2018-12-19 NOTE — Progress Notes (Signed)
Reviewed and agree with assessment/plan.   Yonael Tulloch, MD [facility] 04/24/2016, 12:24 PM Pager:  [phone]  

## 2018-12-20 NOTE — Progress Notes (Signed)
Reviewed and agree with assessment/plan.   Kariem Wolfson, MD [facility] 04/24/2016, 12:24 PM Pager:  [phone]  

## 2018-12-21 ENCOUNTER — Ambulatory Visit: Payer: Medicare HMO | Admitting: Internal Medicine

## 2018-12-21 ENCOUNTER — Ambulatory Visit
Admission: RE | Admit: 2018-12-21 | Discharge: 2018-12-21 | Disposition: A | Discharge status: Home / Self Care | Payer: Medicare HMO | Source: Ambulatory Visit | Attending: Radiation Oncology | Admitting: Radiation Oncology

## 2018-12-21 ENCOUNTER — Inpatient Hospital Stay: Payer: Medicare HMO

## 2018-12-21 ENCOUNTER — Other Ambulatory Visit: Payer: Self-pay

## 2018-12-21 ENCOUNTER — Other Ambulatory Visit: Payer: Medicare HMO

## 2018-12-21 VITALS — BP 130/79 | HR 57 | Temp 97.8°F | Resp 19 | Ht 73.0 in | Wt 177.2 lb

## 2018-12-21 DIAGNOSIS — Z7901 Long term (current) use of anticoagulants: Secondary | ICD-10-CM | POA: Diagnosis not present

## 2018-12-21 DIAGNOSIS — C3431 Malignant neoplasm of lower lobe, right bronchus or lung: Secondary | ICD-10-CM

## 2018-12-21 DIAGNOSIS — Z79899 Other long term (current) drug therapy: Secondary | ICD-10-CM | POA: Diagnosis not present

## 2018-12-21 DIAGNOSIS — I4891 Unspecified atrial fibrillation: Secondary | ICD-10-CM | POA: Diagnosis not present

## 2018-12-21 DIAGNOSIS — Z87891 Personal history of nicotine dependence: Secondary | ICD-10-CM | POA: Diagnosis not present

## 2018-12-21 DIAGNOSIS — D696 Thrombocytopenia, unspecified: Secondary | ICD-10-CM | POA: Diagnosis not present

## 2018-12-21 DIAGNOSIS — Z510 Encounter for antineoplastic radiation therapy: Secondary | ICD-10-CM | POA: Diagnosis not present

## 2018-12-21 DIAGNOSIS — C3491 Malignant neoplasm of unspecified part of right bronchus or lung: Secondary | ICD-10-CM

## 2018-12-21 DIAGNOSIS — Z5111 Encounter for antineoplastic chemotherapy: Secondary | ICD-10-CM | POA: Diagnosis not present

## 2018-12-21 DIAGNOSIS — D709 Neutropenia, unspecified: Secondary | ICD-10-CM | POA: Diagnosis not present

## 2018-12-21 LAB — CMP (CANCER CENTER ONLY)
ALT: 19 U/L (ref 0–44)
AST: 18 U/L (ref 15–41)
Albumin: 3.7 g/dL (ref 3.5–5.0)
Alkaline Phosphatase: 76 U/L (ref 38–126)
Anion gap: 11 (ref 5–15)
BUN: 15 mg/dL (ref 8–23)
CO2: 19 mmol/L — ABNORMAL LOW (ref 22–32)
Calcium: 9 mg/dL (ref 8.9–10.3)
Chloride: 103 mmol/L (ref 98–111)
Creatinine: 0.81 mg/dL (ref 0.61–1.24)
GFR, Est AFR Am: >60 mL/min (ref >60)
GFR, Estimated: >60 mL/min (ref >60)
Glucose, Bld: 119 mg/dL — ABNORMAL HIGH (ref 70–99)
Potassium: 4.9 mmol/L (ref 3.5–5.1)
Sodium: 133 mmol/L — ABNORMAL LOW (ref 135–145)
Total Bilirubin: 0.8 mg/dL (ref 0.3–1.2)
Total Protein: 7 g/dL (ref 6.5–8.1)

## 2018-12-21 LAB — CBC WITH DIFFERENTIAL (CANCER CENTER ONLY)
Abs Immature Granulocytes: 0.03 10*3/uL (ref 0.00–0.07)
Basophils Absolute: 0.1 10*3/uL (ref 0.0–0.1)
Basophils Relative: 3 %
Eosinophils Absolute: 0 10*3/uL (ref 0.0–0.5)
Eosinophils Relative: 2 %
HCT: 33.9 % — ABNORMAL LOW (ref 39.0–52.0)
Hemoglobin: 11.8 g/dL — ABNORMAL LOW (ref 13.0–17.0)
Immature Granulocytes: 2 %
Lymphocytes Relative: 28 %
Lymphs Abs: 0.5 10*3/uL — ABNORMAL LOW (ref 0.7–4.0)
MCH: 37.5 pg — ABNORMAL HIGH (ref 26.0–34.0)
MCHC: 34.8 g/dL (ref 30.0–36.0)
MCV: 107.6 fL — ABNORMAL HIGH (ref 80.0–100.0)
Monocytes Absolute: 0.1 10*3/uL (ref 0.1–1.0)
Monocytes Relative: 6 %
Neutro Abs: 1.2 10*3/uL — ABNORMAL LOW (ref 1.7–7.7)
Neutrophils Relative %: 59 %
Platelet Count: 129 10*3/uL — ABNORMAL LOW (ref 150–400)
RBC: 3.15 MIL/uL — ABNORMAL LOW (ref 4.22–5.81)
RDW: 14.1 % (ref 11.5–15.5)
WBC Count: 1.9 10*3/uL — ABNORMAL LOW (ref 4.0–10.5)
nRBC: 0 % (ref 0.0–0.2)

## 2018-12-21 MED ORDER — PALONOSETRON HCL INJECTION 0.25 MG/5ML
INTRAVENOUS | Status: AC
  Filled 2018-12-21: qty 5

## 2018-12-21 MED ORDER — PALONOSETRON HCL INJECTION 0.25 MG/5ML
0.2500 mg | Freq: Once | INTRAVENOUS | Status: AC
  Administered 2018-12-21: 0.25 mg via INTRAVENOUS

## 2018-12-21 MED ORDER — DIPHENHYDRAMINE HCL 50 MG/ML IJ SOLN
50.0000 mg | Freq: Once | INTRAMUSCULAR | Status: AC
  Administered 2018-12-21: 50 mg via INTRAVENOUS

## 2018-12-21 MED ORDER — SODIUM CHLORIDE 0.9 % IV SOLN
214.8000 mg | Freq: Once | INTRAVENOUS | Status: AC
  Administered 2018-12-21: 14:00:00 210 mg via INTRAVENOUS
  Filled 2018-12-21: qty 21

## 2018-12-21 MED ORDER — SODIUM CHLORIDE 0.9 % IV SOLN
Freq: Once | INTRAVENOUS | Status: AC
  Administered 2018-12-21: 12:00:00 via INTRAVENOUS
  Filled 2018-12-21: qty 250

## 2018-12-21 MED ORDER — FAMOTIDINE IN NACL 20-0.9 MG/50ML-% IV SOLN
INTRAVENOUS | Status: AC
  Filled 2018-12-21: qty 50

## 2018-12-21 MED ORDER — DIPHENHYDRAMINE HCL 50 MG/ML IJ SOLN
INTRAMUSCULAR | Status: AC
  Filled 2018-12-21: qty 1

## 2018-12-21 MED ORDER — SODIUM CHLORIDE 0.9 % IV SOLN
45.0000 mg/m2 | Freq: Once | INTRAVENOUS | Status: AC
  Administered 2018-12-21: 13:00:00 90 mg via INTRAVENOUS
  Filled 2018-12-21: qty 15

## 2018-12-21 MED ORDER — SODIUM CHLORIDE 0.9 % IV SOLN
20.0000 mg | Freq: Once | INTRAVENOUS | Status: AC
  Administered 2018-12-21: 12:00:00 20 mg via INTRAVENOUS
  Filled 2018-12-21: qty 20

## 2018-12-21 MED ORDER — FAMOTIDINE IN NACL 20-0.9 MG/50ML-% IV SOLN
20.0000 mg | Freq: Once | INTRAVENOUS | Status: AC
  Administered 2018-12-21: 12:00:00 20 mg via INTRAVENOUS

## 2018-12-21 NOTE — Telephone Encounter (Signed)
Is this appropriate for refill ? 

## 2018-12-21 NOTE — Telephone Encounter (Signed)
Thanks

## 2018-12-21 NOTE — Progress Notes (Signed)
Per Dr. Julien Nordmann: OK to treat with ANC of 1.2 and WBC of 1.9

## 2018-12-21 NOTE — Telephone Encounter (Signed)
[name] I started Joe Cole on trazodone, you saw him last. Did he mention if he found it helpful?

## 2018-12-21 NOTE — Telephone Encounter (Signed)
Yup I can refill.  Joe Cole

## 2018-12-21 NOTE — Patient Instructions (Addendum)
[facility] Discharge Instructions for Patients Receiving Chemotherapy  Today you received the following chemotherapy agents: Paclitaxel (Taxol) and Carboplatin (Paraplatin)  To help prevent nausea and vomiting after your treatment, we encourage you to take your nausea medication as directed.   If you develop nausea and vomiting that is not controlled by your nausea medication, call the clinic.   BELOW ARE SYMPTOMS THAT SHOULD BE REPORTED IMMEDIATELY:  *FEVER GREATER THAN 100.5 F  *CHILLS WITH OR WITHOUT FEVER  NAUSEA AND VOMITING THAT IS NOT CONTROLLED WITH YOUR NAUSEA MEDICATION  *UNUSUAL SHORTNESS OF BREATH  *UNUSUAL BRUISING OR BLEEDING  TENDERNESS IN MOUTH AND THROAT WITH OR WITHOUT PRESENCE OF ULCERS  *URINARY PROBLEMS  *BOWEL PROBLEMS  UNUSUAL RASH Items with * indicate a potential emergency and should be followed up as soon as possible.  Feel free to call the clinic should you have any questions or concerns. The clinic phone number is [phone].  Please show the [place] at check-in to the Emergency Department and triage nurse.  Managing Low Blood Counts During Cancer Treatment Cancer treatments such as chemotherapy and radiation can sometimes cause a drop in the supply of blood cells in the body, including red blood cells, white blood cells, and platelets. These blood cells are produced in the body and are released into the blood to perform specific functions:  Red blood cells carry gases such as oxygen and carbon dioxide to and from your lungs.  White blood cells help protect you from infection.  Platelets help your body to form blood clots to prevent and control bleeding. When cancer treatments cause a drop in blood cell counts, your body may not have enough cells to keep up its normal functions. Symptoms or problems that may result will vary depending on which type of blood cells the treatment is affecting. If your blood counts are low,  you can take steps to help manage any problems. How can low blood counts affect me? Low blood counts have various effects depending on the type of blood cells involved:  If you have a low number of red blood cells, you have a condition called anemia. This can cause symptoms such as: ? Feeling tired and weak. ? Feeling light-headed. ? Being short of breath.  If you have a low number of white blood cells, you may be at higher risk for infections.  If you have a low number of platelets, you may bleed more easily, or your body may have trouble stopping any bleeding. You may also have more bruising. How to manage symptoms or prevent problems from a low blood count If you have a low blood count, you can take steps to manage symptoms or prevent problems that may develop. The steps to take will depend on which type of blood cell is low. Low red blood cells Take these steps to help manage the symptoms of anemia:  Go for a walk or do some light exercise each day.  Take short naps during the day.  Eat foods that contain a lot of iron and protein. These include leafy vegetables, meat and fish, beans, sweet potatoes, and dried fruit such as prunes, raisins, and apricots.  Ask for help with errands and with work that needs to be done around the house. It is important to save your energy.  Take vitamins or supplements-such as iron, vitamin C00, or folic acid-as told by your health care provider.  Practice relaxation techniques, such as yoga or meditation. Low  white blood cells Take these steps to help prevent infections:  Wash your hands often with warm, soapy water.  Avoid crowds of people and any person who has the flu or a fever.  Take care when cleaning yourself after using the bathroom. Tell your health care provider if you have any rectal sores or bleeding.  Avoid dental work. Check your mouth each day for sores or signs of infection.  Do not share utensils.  Avoid contact with pet  waste. Wash your hands after handling pets.  If you get a scrape or cut, clean it thoroughly right away.  Avoid fresh plants or dried flowers.  Do not swim or wade in lakes, ponds, rivers, water parks, or hot tubs.  Follow food safety guidelines. Cook meat thoroughly and wash all raw fruits and vegetables.  You may be instructed to wear a mask when around others to protect yourself.  Low platelets Take these steps to help prevent or control bleeding and bruising:  Use an electric razor for shaving instead of a blade.  Use a soft toothbrush and be careful during oral care. Talk with your cancer care team about whether you should avoid flossing. If your mouth is bleeding, rinse it with ice water.  Avoid activities that could cause injury, such as contact sports.  Talk with your health care provider about using laxatives or stool softeners to avoid constipation.  Do not use medicines such as ibuprofen, aspirin, or naproxen unless your health care provider tells you to.  Limit alcohol use.  Monitor any bleeding closely. If you start bleeding, hold pressure on the area for 5 minutes to stop the bleeding. Bleeding that does not stop is considered an emergency. What treatments can help increase a low blood count? If needed, your health care provider may recommend treatment for a low blood count. Treatment will depend on the type of blood cell that is low and the severity of your condition. Treatment options may include:  Taking medicines to help stimulate the growth of blood cells. This is an option for treating a low red blood cell count. Your health care provider may also recommend that you take iron, folic acid, or vitamin B12 supplements.  Making dietary changes. Including more iron and protein in your diet can help stimulate the growth of red blood cells.  Adjusting your current medicines to help raise blood counts.  Making changes to your treatment plan.  Having a blood  transfusion. This may be done if your blood count is very low. Contact a health care provider if:  You feel extremely tired and weak.  You have more bruising or bleeding.  You feel ill or you develop a cough.  You have swelling or redness.  You have mouth sores or a sore throat.  You have painful urination or you have blood in your urine or stool.  You are thinking of taking any new supplements or vitamins or making dietary changes. Get help right away if:  You are short of breath, have chest pain, or feel dizzy.  You have a fever or chills.  You have abdominal pain or diarrhea.  You have bleeding that will not stop. Summary  Cancer treatments such as chemotherapy and radiation can sometimes cause a drop in the supply of blood cells in the body, including red blood cells, white blood cells, and platelets.  If you have a low blood count, you can take steps to manage symptoms or prevent problems that may develop.  Depending  on which type of blood cell is low, you may need to take steps to prevent infection, prevent bleeding, or manage symptoms that may develop.  If needed, your health care provider may recommend treatment for a low blood count. This information is not intended to replace advice given to you by your health care provider. Make sure you discuss any questions you have with your health care provider. Document Released: 12/09/2015 Document Revised: 05/02/2016 Document Reviewed: 12/09/2015 Elsevier Patient Education  2020 [place] (COVID-19) Are you at risk?  Are you at risk for the Coronavirus (COVID-19)?  To be considered HIGH RISK for Coronavirus (COVID-19), you have to meet the following criteria:  . Traveled to [place], [place], [place], [place] or [place]; or in the [place] to [place], [place], [place], or [place]; and have fever, cough, and shortness of breath within the last 2 weeks of travel OR . Been in close contact with a  person diagnosed with COVID-19 within the last 2 weeks and have fever, cough, and shortness of breath . IF YOU DO NOT MEET THESE CRITERIA, YOU ARE CONSIDERED LOW RISK FOR COVID-19.  What to do if you are HIGH RISK for COVID-19?  Marland Kitchen If you are having a medical emergency, call 911. . Seek medical care right away. Before you go to a doctor's office, urgent care or emergency department, call ahead and tell them about your recent travel, contact with someone diagnosed with COVID-19, and your symptoms. You should receive instructions from your physician's office regarding next steps of care.  . When you arrive at healthcare provider, tell the healthcare staff immediately you have returned from visiting [place], [place], [place], [place] or [place]; or traveled in the [place] to [place], [place], [place], or [place]; in the last two weeks or you have been in close contact with a person diagnosed with COVID-19 in the last 2 weeks.   . Tell the health care staff about your symptoms: fever, cough and shortness of breath. . After you have been seen by a medical provider, you will be either: o Tested for (COVID-19) and discharged home on quarantine except to seek medical care if symptoms worsen, and asked to  - Stay home and avoid contact with others until you get your results (4-5 days)  - Avoid travel on public transportation if possible (such as bus, train, or airplane) or o Sent to the Emergency Department by EMS for evaluation, COVID-19 testing, and possible admission depending on your condition and test results.  What to do if you are LOW RISK for COVID-19?  Reduce your risk of any infection by using the same precautions used for avoiding the common cold or flu:  Marland Kitchen Wash your hands often with soap and warm water for at least 20 seconds.  If soap and water are not readily available, use an alcohol-based hand sanitizer with at least 60% alcohol.  . If coughing or sneezing, cover your mouth and  nose by coughing or sneezing into the elbow areas of your shirt or coat, into a tissue or into your sleeve (not your hands). . Avoid shaking hands with others and consider head nods or verbal greetings only. . Avoid touching your eyes, nose, or mouth with unwashed hands.  . Avoid close contact with people who are sick. . Avoid places or events with large numbers of people in one location, like concerts or sporting events. . Carefully consider travel plans you have or are  making. . If you are planning any travel outside or inside the [place], visit the CDC's Travelers' Health webpage for the latest health notices. . If you have some symptoms but not all symptoms, continue to monitor at home and seek medical attention if your symptoms worsen. . If you are having a medical emergency, call 911.   [place] / e-Visit: [email or web address]         [facility]: [facility]: [phone]                   [facility]: [phone]

## 2018-12-22 ENCOUNTER — Other Ambulatory Visit: Payer: Self-pay

## 2018-12-22 ENCOUNTER — Ambulatory Visit
Admission: RE | Admit: 2018-12-22 | Discharge: 2018-12-22 | Disposition: A | Discharge status: Home / Self Care | Payer: Medicare HMO | Source: Ambulatory Visit | Attending: Radiation Oncology | Admitting: Radiation Oncology

## 2018-12-22 DIAGNOSIS — Z87891 Personal history of nicotine dependence: Secondary | ICD-10-CM | POA: Diagnosis not present

## 2018-12-22 DIAGNOSIS — C3431 Malignant neoplasm of lower lobe, right bronchus or lung: Secondary | ICD-10-CM | POA: Diagnosis not present

## 2018-12-22 DIAGNOSIS — Z510 Encounter for antineoplastic radiation therapy: Secondary | ICD-10-CM | POA: Diagnosis not present

## 2018-12-22 LAB — ACID FAST CULTURE WITH REFLEXED SENSITIVITIES (MYCOBACTERIA): Acid Fast Culture: NEGATIVE

## 2018-12-23 ENCOUNTER — Ambulatory Visit
Admission: RE | Admit: 2018-12-23 | Discharge: 2018-12-23 | Disposition: A | Discharge status: Home / Self Care | Payer: Medicare HMO | Source: Ambulatory Visit | Attending: Radiation Oncology | Admitting: Radiation Oncology

## 2018-12-23 ENCOUNTER — Other Ambulatory Visit: Payer: Self-pay

## 2018-12-23 DIAGNOSIS — Z510 Encounter for antineoplastic radiation therapy: Secondary | ICD-10-CM | POA: Diagnosis not present

## 2018-12-23 DIAGNOSIS — Z87891 Personal history of nicotine dependence: Secondary | ICD-10-CM | POA: Diagnosis not present

## 2018-12-23 DIAGNOSIS — C3431 Malignant neoplasm of lower lobe, right bronchus or lung: Secondary | ICD-10-CM | POA: Diagnosis not present

## 2018-12-24 ENCOUNTER — Other Ambulatory Visit: Payer: Self-pay

## 2018-12-24 ENCOUNTER — Ambulatory Visit
Admission: RE | Admit: 2018-12-24 | Discharge: 2018-12-24 | Disposition: A | Discharge status: Home / Self Care | Payer: Medicare HMO | Source: Ambulatory Visit | Attending: Radiation Oncology | Admitting: Radiation Oncology

## 2018-12-24 DIAGNOSIS — C3431 Malignant neoplasm of lower lobe, right bronchus or lung: Secondary | ICD-10-CM | POA: Diagnosis not present

## 2018-12-24 DIAGNOSIS — Z87891 Personal history of nicotine dependence: Secondary | ICD-10-CM | POA: Diagnosis not present

## 2018-12-24 DIAGNOSIS — Z510 Encounter for antineoplastic radiation therapy: Secondary | ICD-10-CM | POA: Diagnosis not present

## 2018-12-25 ENCOUNTER — Other Ambulatory Visit: Payer: Self-pay

## 2018-12-25 ENCOUNTER — Ambulatory Visit
Admission: RE | Admit: 2018-12-25 | Discharge: 2018-12-25 | Disposition: A | Discharge status: Home / Self Care | Payer: Medicare HMO | Source: Ambulatory Visit | Attending: Radiation Oncology | Admitting: Radiation Oncology

## 2018-12-25 DIAGNOSIS — C3431 Malignant neoplasm of lower lobe, right bronchus or lung: Secondary | ICD-10-CM | POA: Diagnosis not present

## 2018-12-25 DIAGNOSIS — Z510 Encounter for antineoplastic radiation therapy: Secondary | ICD-10-CM | POA: Diagnosis not present

## 2018-12-25 DIAGNOSIS — Z87891 Personal history of nicotine dependence: Secondary | ICD-10-CM | POA: Diagnosis not present

## 2018-12-28 ENCOUNTER — Ambulatory Visit
Admission: RE | Admit: 2018-12-28 | Discharge: 2018-12-28 | Disposition: A | Discharge status: Home / Self Care | Payer: Medicare HMO | Source: Ambulatory Visit | Attending: Radiation Oncology | Admitting: Radiation Oncology

## 2018-12-28 ENCOUNTER — Encounter: Payer: Self-pay | Admitting: Internal Medicine

## 2018-12-28 ENCOUNTER — Inpatient Hospital Stay (HOSPITAL_BASED_OUTPATIENT_CLINIC_OR_DEPARTMENT_OTHER): Payer: Medicare HMO | Admitting: Internal Medicine

## 2018-12-28 ENCOUNTER — Other Ambulatory Visit: Payer: Self-pay

## 2018-12-28 ENCOUNTER — Inpatient Hospital Stay: Payer: Medicare HMO

## 2018-12-28 VITALS — BP 113/71 | HR 61 | Temp 98.0°F | Resp 18 | Ht 73.0 in | Wt 177.2 lb

## 2018-12-28 DIAGNOSIS — C3431 Malignant neoplasm of lower lobe, right bronchus or lung: Secondary | ICD-10-CM | POA: Diagnosis not present

## 2018-12-28 DIAGNOSIS — Z79899 Other long term (current) drug therapy: Secondary | ICD-10-CM | POA: Diagnosis not present

## 2018-12-28 DIAGNOSIS — I4891 Unspecified atrial fibrillation: Secondary | ICD-10-CM | POA: Diagnosis not present

## 2018-12-28 DIAGNOSIS — Z5111 Encounter for antineoplastic chemotherapy: Secondary | ICD-10-CM | POA: Diagnosis not present

## 2018-12-28 DIAGNOSIS — D696 Thrombocytopenia, unspecified: Secondary | ICD-10-CM | POA: Diagnosis not present

## 2018-12-28 DIAGNOSIS — Z7901 Long term (current) use of anticoagulants: Secondary | ICD-10-CM | POA: Diagnosis not present

## 2018-12-28 DIAGNOSIS — C3490 Malignant neoplasm of unspecified part of unspecified bronchus or lung: Secondary | ICD-10-CM

## 2018-12-28 DIAGNOSIS — C3491 Malignant neoplasm of unspecified part of right bronchus or lung: Secondary | ICD-10-CM | POA: Diagnosis not present

## 2018-12-28 DIAGNOSIS — Z87891 Personal history of nicotine dependence: Secondary | ICD-10-CM | POA: Diagnosis not present

## 2018-12-28 DIAGNOSIS — D709 Neutropenia, unspecified: Secondary | ICD-10-CM | POA: Diagnosis not present

## 2018-12-28 DIAGNOSIS — Z510 Encounter for antineoplastic radiation therapy: Secondary | ICD-10-CM | POA: Diagnosis not present

## 2018-12-28 LAB — CBC WITH DIFFERENTIAL (CANCER CENTER ONLY)
Abs Immature Granulocytes: 0.01 10*3/uL (ref 0.00–0.07)
Basophils Absolute: 0 10*3/uL (ref 0.0–0.1)
Basophils Relative: 3 %
Eosinophils Absolute: 0 10*3/uL (ref 0.0–0.5)
Eosinophils Relative: 1 %
HCT: 31.2 % — ABNORMAL LOW (ref 39.0–52.0)
Hemoglobin: 10.8 g/dL — ABNORMAL LOW (ref 13.0–17.0)
Immature Granulocytes: 1 %
Lymphocytes Relative: 35 %
Lymphs Abs: 0.4 10*3/uL — ABNORMAL LOW (ref 0.7–4.0)
MCH: 36.2 pg — ABNORMAL HIGH (ref 26.0–34.0)
MCHC: 34.6 g/dL (ref 30.0–36.0)
MCV: 104.7 fL — ABNORMAL HIGH (ref 80.0–100.0)
Monocytes Absolute: 0.1 10*3/uL (ref 0.1–1.0)
Monocytes Relative: 6 %
Neutro Abs: 0.6 10*3/uL — ABNORMAL LOW (ref 1.7–7.7)
Neutrophils Relative %: 54 %
Platelet Count: 84 10*3/uL — ABNORMAL LOW (ref 150–400)
RBC: 2.98 MIL/uL — ABNORMAL LOW (ref 4.22–5.81)
RDW: 13.6 % (ref 11.5–15.5)
WBC Count: 1.2 10*3/uL — ABNORMAL LOW (ref 4.0–10.5)
nRBC: 0 % (ref 0.0–0.2)

## 2018-12-28 LAB — CMP (CANCER CENTER ONLY)
ALT: 22 U/L (ref 0–44)
AST: 19 U/L (ref 15–41)
Albumin: 3.6 g/dL (ref 3.5–5.0)
Alkaline Phosphatase: 74 U/L (ref 38–126)
Anion gap: 12 (ref 5–15)
BUN: 16 mg/dL (ref 8–23)
CO2: 18 mmol/L — ABNORMAL LOW (ref 22–32)
Calcium: 9 mg/dL (ref 8.9–10.3)
Chloride: 103 mmol/L (ref 98–111)
Creatinine: 0.78 mg/dL (ref 0.61–1.24)
GFR, Est AFR Am: >60 mL/min (ref >60)
GFR, Estimated: >60 mL/min (ref >60)
Glucose, Bld: 106 mg/dL — ABNORMAL HIGH (ref 70–99)
Potassium: 5.2 mmol/L — ABNORMAL HIGH (ref 3.5–5.1)
Sodium: 133 mmol/L — ABNORMAL LOW (ref 135–145)
Total Bilirubin: 0.6 mg/dL (ref 0.3–1.2)
Total Protein: 6.7 g/dL (ref 6.5–8.1)

## 2018-12-28 NOTE — Progress Notes (Signed)
[place] [phone]   [phone]  OFFICE PROGRESS NOTE  [name], MD [place] [address]  DIAGNOSIS: Stage IIIA (T3, N1, M0) non-small cell lung cancer, squamous cell carcinoma diagnosed in July 2020 and presented with right middle lobe mass and right hilar adenopathy with postobstructive pneumonia and suspicious right pleural effusion.  PRIOR THERAPY: None  CURRENT THERAPY: Weekly concurrent chemoradiation with Carboplatin for an AUC of 2 and paclitaxel 45 mg/m2. First dose 11/23/2018. Status post 5 cycles.   INTERVAL HISTORY: Joe Cole 68 y.o. male returns to the clinic today for follow-up visit.  The patient is feeling fine today with no concerning complaints.  He continues to tolerate his treatment with concurrent chemoradiation fairly well.  He denied having any chest pain, shortness of breath, cough or hemoptysis.  He denied having any fever or chills.  He has no nausea, vomiting, diarrhea or constipation.  He has no headache or visual changes.  The patient denied having any significant weight loss or night sweats.  He has no dysphagia or odynophagia.  He was here today for evaluation before starting cycle #6 of his treatment.  MEDICAL HISTORY: Past Medical History:  Diagnosis Date  . Arthritis   . Atrial fibrillation ([place] [facility]) 10/2018  . COPD with emphysema ([place])   . Dyspnea   . Hypertension     ALLERGIES:  has No Known Allergies.  MEDICATIONS:  Current Outpatient Medications  Medication Sig Dispense Refill  . albuterol (PROAIR HFA) 108 (90 Base) MCG/ACT inhaler Inhale 2 puffs into the lungs every 6 (six) hours as needed for wheezing or shortness of breath. 1 Inhaler 5  . apixaban (ELIQUIS) 5 MG TABS tablet Take 1 tablet (5 mg total) by mouth 2 (two) times daily. 60 tablet 3  . atorvastatin (LIPITOR) 20 MG tablet Take 20 mg by mouth daily.    Marland Kitchen diltiazem (CARDIZEM CD) 360 MG 24 hr capsule  Take 1 capsule (360 mg total) by mouth daily. 30 capsule 3  . diphenhydramine-acetaminophen (TYLENOL PM) 25-500 MG TABS tablet Take 2 tablets by mouth at bedtime as needed (sleep).     . docusate sodium (COLACE) 100 MG capsule Take 1 capsule (100 mg total) by mouth 2 (two) times daily. 10 capsule 0  . metoprolol succinate (TOPROL XL) 25 MG 24 hr tablet Take 1 tablet (25 mg total) by mouth at bedtime. 30 tablet 11  . metoprolol succinate (TOPROL XL) 50 MG 24 hr tablet Take 1 tablet (50 mg total) by mouth at bedtime. Take with or immediately following a meal. 30 tablet 11  . Probiotic Product (ALIGN) 4 MG CAPS Take 4 mg by mouth daily.    . prochlorperazine (COMPAZINE) 10 MG tablet Take 1 tablet (10 mg total) by mouth every 6 (six) hours as needed for nausea or vomiting. 30 tablet 0  . traZODone (DESYREL) 50 MG tablet TAKE 1 TABLET BY MOUTH EVERYDAY AT BEDTIME 90 tablet 1  . umeclidinium-vilanterol (ANORO ELLIPTA) 62.5-25 MCG/INH AEPB Inhale 1 puff into the lungs daily. 1 each 6   No current facility-administered medications for this visit.     SURGICAL HISTORY:  Past Surgical History:  Procedure Laterality Date  . KNEE ARTHROSCOPY     BIL   . TOE SURGERY     PINNED LEFT BIG TOE  . TONSILLECTOMY     T+A  AS CHILD  . TOTAL KNEE ARTHROPLASTY Left 08/19/2017   Procedure:  TOTAL KNEE ARTHROPLASTY;  Surgeon: Renette Butters, MD;  Location: [facility];  Service: Orthopedics;  Laterality: Left;  Marland Kitchen VIDEO BRONCHOSCOPY Bilateral 11/05/2018   Procedure: VIDEO BRONCHOSCOPY WITH FLUORO;  Surgeon: [name], MD;  Location: [facility];  Service: Cardiopulmonary;  Laterality: Bilateral;  . VIDEO BRONCHOSCOPY Bilateral 11/09/2018   Procedure: VIDEO BRONCHOSCOPY WITH FLUORO;  Surgeon: Rigoberto Noel, MD;  Location: [facility];  Service: Cardiopulmonary;  Laterality: Bilateral;    REVIEW OF SYSTEMS:  A comprehensive review of systems was negative.   PHYSICAL EXAMINATION: General appearance: alert,  cooperative and no distress Head: Normocephalic, without obvious abnormality, atraumatic Neck: no adenopathy, no JVD, supple, symmetrical, trachea midline and thyroid not enlarged, symmetric, no tenderness/mass/nodules Lymph nodes: Cervical, supraclavicular, and axillary nodes normal. Resp: clear to auscultation bilaterally Back: symmetric, no curvature. ROM normal. No CVA tenderness. Cardio: regular rate and rhythm, S1, S2 normal, no murmur, click, rub or gallop GI: soft, non-tender; bowel sounds normal; no masses,  no organomegaly Extremities: extremities normal, atraumatic, no cyanosis or edema  ECOG PERFORMANCE STATUS: 1 - Symptomatic but completely ambulatory  Blood pressure 113/71, pulse 61, temperature 98 F (36.7 C), temperature source Oral, resp. rate 18, height 6\' 1"  (1.854 m), weight 177 lb 3.2 oz (80.4 kg), SpO2 99 %.  LABORATORY DATA: Lab Results  Component Value Date   WBC 1.2 (L) 12/28/2018   HGB 10.8 (L) 12/28/2018   HCT 31.2 (L) 12/28/2018   MCV 104.7 (H) 12/28/2018   PLT 84 (L) 12/28/2018      Chemistry      Component Value Date/Time   NA 133 (L) 12/21/2018 1008   K 4.9 12/21/2018 1008   CL 103 12/21/2018 1008   CO2 19 (L) 12/21/2018 1008   BUN 15 12/21/2018 1008   CREATININE 0.81 12/21/2018 1008      Component Value Date/Time   CALCIUM 9.0 12/21/2018 1008   ALKPHOS 76 12/21/2018 1008   AST 18 12/21/2018 1008   ALT 19 12/21/2018 1008   BILITOT 0.8 12/21/2018 1008       RADIOGRAPHIC STUDIES: Mr Jeri Cos OI Contrast  Result Date: 12/02/2018 CLINICAL DATA:  Malignant neoplasm of unspecified part of bronchus or lung. Non-small cell lung cancer. Staging. EXAM: MRI HEAD WITHOUT AND WITH CONTRAST TECHNIQUE: Multiplanar, multiecho pulse sequences of the brain and surrounding structures were obtained without and with intravenous contrast. CONTRAST:  10 ML GADAVIST COMPARISON:  None. FINDINGS: Brain: No acute infarct, hemorrhage, or mass lesion is present.  Postcontrast images demonstrate no pathologic enhancement. Atrophy and white matter changes are mildly advanced for age. The ventricles are proportionate to the degree of atrophy. No significant extraaxial fluid collection is present. The internal auditory canals are within normal limits. White matter changes extend into the brainstem. The brainstem and cerebellum are otherwise unremarkable. Vascular: Flow is present in the major intracranial arteries. Skull and upper cervical spine: The craniocervical junction is normal. Upper cervical spine is within normal limits. Marrow signal is unremarkable. Sinuses/Orbits: The paranasal sinuses and mastoid air cells are clear. The globes and orbits are within normal limits. IMPRESSION: 1. No evidence for metastatic disease to the brain or meninges. 2. Atrophy and white matter changes are mildly advanced for age. This is nonspecific, likely reflects the sequela of chronic microvascular ischemia. Electronically Signed   By: San Morelle M.D.   On: 12/02/2018 10:36    ASSESSMENT AND PLAN: This is a very pleasant 68 years old white male with stage IIIA non-small cell  lung cancer, squamous cell carcinoma.  The patient is currently undergoing a course of concurrent chemoradiation with weekly carboplatin and paclitaxel status post 5 cycles. The patient has been tolerating this treatment well with no concerning adverse effects. CBC today showed leukocytopenia, neutropenia as well as thrombocytopenia and does not meet the parameter for treatment today. I recommended for the patient to hold his treatment today and he may resume it next week if his count improves. I will see him back for follow-up visit in 1 months for evaluation after repeating CT scan of the chest for restaging of his disease. The patient was advised to call immediately if he has any concerning symptoms in the interval. The patient voices understanding of current disease status and treatment options  and is in agreement with the current care plan. All questions were answered. The patient knows to call the clinic with any problems, questions or concerns. We can certainly see the patient much sooner if necessary.  I spent 10 minutes counseling the patient face to face. The total time spent in the appointment was 15 minutes.  Disclaimer: This note was dictated with voice recognition software. Similar sounding words can inadvertently be transcribed and may not be corrected upon review.

## 2018-12-29 ENCOUNTER — Other Ambulatory Visit: Payer: Self-pay | Admitting: Pharmacist

## 2018-12-29 ENCOUNTER — Ambulatory Visit: Payer: Self-pay | Admitting: Pharmacist

## 2018-12-29 ENCOUNTER — Other Ambulatory Visit: Payer: Self-pay

## 2018-12-29 ENCOUNTER — Ambulatory Visit
Admission: RE | Admit: 2018-12-29 | Discharge: 2018-12-29 | Disposition: A | Discharge status: Home / Self Care | Payer: Medicare HMO | Source: Ambulatory Visit | Attending: Radiation Oncology | Admitting: Radiation Oncology

## 2018-12-29 DIAGNOSIS — C3431 Malignant neoplasm of lower lobe, right bronchus or lung: Secondary | ICD-10-CM | POA: Insufficient documentation

## 2018-12-29 DIAGNOSIS — Z87891 Personal history of nicotine dependence: Secondary | ICD-10-CM | POA: Insufficient documentation

## 2018-12-29 DIAGNOSIS — Z510 Encounter for antineoplastic radiation therapy: Secondary | ICD-10-CM | POA: Insufficient documentation

## 2018-12-29 NOTE — Patient Outreach (Signed)
[place] [facility]) Care Management  12/29/2018  FREMAN LAPAGE [birth year] [id number]    Patient was called regarding medication assistance with Eliquis. Unfortunately, he did not answer the phone. HIPAA compliant message was left on his voicemail.  (Patient was called on both the home and mobile numbers.)  Patient was sent an unsuccessful outreach letter on 12/18/2018  Plan: Call patient back in 7-10 business days. Close case if patient is unable to be reached.  Elayne Guerin, PharmD, [place] Clinical Pharmacist [phone]

## 2018-12-30 ENCOUNTER — Other Ambulatory Visit: Payer: Self-pay

## 2018-12-30 ENCOUNTER — Telehealth: Payer: Self-pay | Admitting: Internal Medicine

## 2018-12-30 ENCOUNTER — Ambulatory Visit
Admission: RE | Admit: 2018-12-30 | Discharge: 2018-12-30 | Disposition: A | Discharge status: Home / Self Care | Payer: Medicare HMO | Source: Ambulatory Visit | Attending: Radiation Oncology | Admitting: Radiation Oncology

## 2018-12-30 DIAGNOSIS — C3431 Malignant neoplasm of lower lobe, right bronchus or lung: Secondary | ICD-10-CM | POA: Diagnosis not present

## 2018-12-30 DIAGNOSIS — Z510 Encounter for antineoplastic radiation therapy: Secondary | ICD-10-CM | POA: Diagnosis not present

## 2018-12-30 NOTE — Telephone Encounter (Signed)
Confirmed September/October appointments with patient wife.

## 2018-12-31 ENCOUNTER — Other Ambulatory Visit: Payer: Self-pay

## 2018-12-31 ENCOUNTER — Other Ambulatory Visit: Payer: Self-pay | Admitting: Pharmacist

## 2018-12-31 ENCOUNTER — Ambulatory Visit
Admission: RE | Admit: 2018-12-31 | Discharge: 2018-12-31 | Disposition: A | Discharge status: Home / Self Care | Payer: Medicare HMO | Source: Ambulatory Visit | Attending: Radiation Oncology | Admitting: Radiation Oncology

## 2018-12-31 DIAGNOSIS — Z510 Encounter for antineoplastic radiation therapy: Secondary | ICD-10-CM | POA: Diagnosis not present

## 2018-12-31 DIAGNOSIS — C3431 Malignant neoplasm of lower lobe, right bronchus or lung: Secondary | ICD-10-CM | POA: Diagnosis not present

## 2018-12-31 NOTE — Patient Outreach (Signed)
[facility]) Care Management  [place]   12/31/2018  DENALI BECVAR 11-Oct-1950 [id number]  Reason for referral: medication assistance with Eliquis  Referral source: MD/[facility] telephonic nurse Referral medication(s): Eliquis Current insurance: [employer]   HPI:  Patient is a 68 year old male with multiple medical conditions including but not limited to:  Atrial fibrillation, COPD, hypertension, insomnia, osteoarthritis of left knee, lung cancer [squamous cell carcinoma of right lung] (undergoing radiation).  Spoke with patient's wife, HIPAA identifiers were obtained.  She expressed concern with the copay of Eliquis    and Anoro.    Objective: No Known Allergies  Medications Reviewed Today    Reviewed by Elayne Guerin, [facility] (Pharmacist) on 12/31/18 at 1038  Med List Status: <None>  Medication Order Taking? Sig Documenting Provider Last Dose Status Informant  albuterol (PROAIR HFA) 108 (90 Base) MCG/ACT inhaler [id number] Yes Inhale 2 puffs into the lungs every 6 (six) hours as needed for wheezing or shortness of breath. [name], MD Taking Active Spouse/Significant Other  apixaban (ELIQUIS) 5 MG TABS tablet [id number] Yes Take 1 tablet (5 mg total) by mouth 2 (two) times daily. Donita Brooks, NP Taking Active   atorvastatin (LIPITOR) 20 MG tablet [id number] Yes Take 20 mg by mouth daily. [provider] Taking Active Spouse/Significant Other  diltiazem (CARDIZEM CD) 360 MG 24 hr capsule [id number] Yes Take 1 capsule (360 mg total) by mouth daily. Donita Brooks, NP Taking Active   diphenhydramine-acetaminophen (TYLENOL PM) 25-500 MG TABS tablet [id number] Yes Take 2 tablets by mouth at bedtime as needed (sleep).  [provider] Taking Active Spouse/Significant Other  docusate sodium (COLACE) 100 MG capsule [id number] Yes Take 1 capsule (100 mg total) by mouth 2 (two) times daily. Fenton Foy, NP Taking Active   metoprolol succinate (TOPROL XL)  25 MG 24 hr tablet [id number] Yes Take 1 tablet (25 mg total) by mouth at bedtime. Fenton, Closter R, PA Taking Active            Med Note Ardeen Garland   Mon Dec 28, 2018  9:37 AM) Total 75 mg hs-  metoprolol succinate (TOPROL XL) 50 MG 24 hr tablet [id number] Yes Take 1 tablet (50 mg total) by mouth at bedtime. Take with or immediately following a meal. Fenton, Clint R, PA Taking Active            Med Note Gloriann Loan, DIANE H   Mon Dec 28, 2018  9:37 AM) Total 75 mg hs  Probiotic Product (ALIGN) 4 MG CAPS [id number] Yes Take 4 mg by mouth daily. [provider] Taking Active Spouse/Significant Other  prochlorperazine (COMPAZINE) 10 MG tablet [id number] Yes Take 1 tablet (10 mg total) by mouth every 6 (six) hours as needed for nausea or vomiting. Curt Bears, MD Taking Active   traZODone (DESYREL) 50 MG tablet [id number] Yes TAKE 1 TABLET BY MOUTH EVERYDAY AT BEDTIME Lauraine Rinne, NP Taking Active   umeclidinium-vilanterol (ANORO ELLIPTA) 62.5-25 MCG/INH AEPB [id number] Yes Inhale 1 puff into the lungs daily. Donita Brooks, NP Taking Active           Assessment:  Drugs sorted by system:  Neurologic/Psychologic: Trazodone  Cardiovascular: Eliquis, Atorvastatin, Diltiazem, Metoprolol Succinate,   Pulmonary/Allergy: Albuterol HFA, Anoro  Gastrointestinal: Docusate Sodium, Prochlorperazine, Probiotic (Align)  Pain: Acetaminophen-Diphenhydramine,   Medication Assistance Findings:  Medication assistance needs identified: Eliquis and Anoro  Patient is over income for [employer]  does not  provide a documented income cut off but they do require patients to spend at least 3% of their income in medication expenses to quality.  Patient's wife was waiting at the Provider's office with her husband and did not know what their household out-of-pocket expenses were at the time of my call.  She said she would call me back and let me know what their spending is per her  last statement.  Provided education to the patient's wife about Medicare Part D (donut hole explanation).   Additional medication assistance options reviewed with patient as warranted:  No other options identified  Plan: Await a call back from the patient's wife. Call patient back in 2-3 weeks.  Elayne Guerin, PharmD, [place] Clinical Pharmacist [phone]

## 2019-01-01 ENCOUNTER — Other Ambulatory Visit: Payer: Self-pay

## 2019-01-01 ENCOUNTER — Ambulatory Visit
Admission: RE | Admit: 2019-01-01 | Discharge: 2019-01-01 | Disposition: A | Discharge status: Home / Self Care | Payer: Medicare HMO | Source: Ambulatory Visit | Attending: Radiation Oncology | Admitting: Radiation Oncology

## 2019-01-01 DIAGNOSIS — C3431 Malignant neoplasm of lower lobe, right bronchus or lung: Secondary | ICD-10-CM | POA: Diagnosis not present

## 2019-01-01 DIAGNOSIS — Z510 Encounter for antineoplastic radiation therapy: Secondary | ICD-10-CM | POA: Diagnosis not present

## 2019-01-05 ENCOUNTER — Ambulatory Visit
Admission: RE | Admit: 2019-01-05 | Discharge: 2019-01-05 | Disposition: A | Discharge status: Home / Self Care | Payer: Medicare HMO | Source: Ambulatory Visit | Attending: Radiation Oncology | Admitting: Radiation Oncology

## 2019-01-05 ENCOUNTER — Inpatient Hospital Stay: Payer: Medicare HMO | Attending: Internal Medicine

## 2019-01-05 ENCOUNTER — Inpatient Hospital Stay: Payer: Medicare HMO

## 2019-01-05 ENCOUNTER — Ambulatory Visit: Payer: Medicare HMO | Admitting: Internal Medicine

## 2019-01-05 ENCOUNTER — Encounter: Payer: Self-pay | Admitting: Radiation Oncology

## 2019-01-05 ENCOUNTER — Telehealth: Payer: Self-pay | Admitting: *Deleted

## 2019-01-05 ENCOUNTER — Other Ambulatory Visit: Payer: Self-pay

## 2019-01-05 ENCOUNTER — Other Ambulatory Visit: Payer: Medicare HMO

## 2019-01-05 DIAGNOSIS — C3491 Malignant neoplasm of unspecified part of right bronchus or lung: Secondary | ICD-10-CM | POA: Diagnosis not present

## 2019-01-05 DIAGNOSIS — C3431 Malignant neoplasm of lower lobe, right bronchus or lung: Secondary | ICD-10-CM | POA: Diagnosis not present

## 2019-01-05 DIAGNOSIS — Z510 Encounter for antineoplastic radiation therapy: Secondary | ICD-10-CM | POA: Diagnosis not present

## 2019-01-05 LAB — CMP (CANCER CENTER ONLY)
ALT: 20 U/L (ref 0–44)
AST: 19 U/L (ref 15–41)
Albumin: 3.4 g/dL — ABNORMAL LOW (ref 3.5–5.0)
Alkaline Phosphatase: 86 U/L (ref 38–126)
Anion gap: 8 (ref 5–15)
BUN: 10 mg/dL (ref 8–23)
CO2: 22 mmol/L (ref 22–32)
Calcium: 8.8 mg/dL — ABNORMAL LOW (ref 8.9–10.3)
Chloride: 105 mmol/L (ref 98–111)
Creatinine: 0.85 mg/dL (ref 0.61–1.24)
GFR, Est AFR Am: >60 mL/min (ref >60)
GFR, Estimated: >60 mL/min (ref >60)
Glucose, Bld: 105 mg/dL — ABNORMAL HIGH (ref 70–99)
Potassium: 4.6 mmol/L (ref 3.5–5.1)
Sodium: 135 mmol/L (ref 135–145)
Total Bilirubin: 0.4 mg/dL (ref 0.3–1.2)
Total Protein: 6.4 g/dL — ABNORMAL LOW (ref 6.5–8.1)

## 2019-01-05 LAB — CBC WITH DIFFERENTIAL (CANCER CENTER ONLY)
Abs Immature Granulocytes: 0.03 10*3/uL (ref 0.00–0.07)
Basophils Absolute: 0 10*3/uL (ref 0.0–0.1)
Basophils Relative: 1 %
Eosinophils Absolute: 0 10*3/uL (ref 0.0–0.5)
Eosinophils Relative: 2 %
HCT: 31.1 % — ABNORMAL LOW (ref 39.0–52.0)
Hemoglobin: 10.7 g/dL — ABNORMAL LOW (ref 13.0–17.0)
Immature Granulocytes: 2 %
Lymphocytes Relative: 24 %
Lymphs Abs: 0.4 10*3/uL — ABNORMAL LOW (ref 0.7–4.0)
MCH: 37.5 pg — ABNORMAL HIGH (ref 26.0–34.0)
MCHC: 34.4 g/dL (ref 30.0–36.0)
MCV: 109.1 fL — ABNORMAL HIGH (ref 80.0–100.0)
Monocytes Absolute: 0.4 10*3/uL (ref 0.1–1.0)
Monocytes Relative: 24 %
Neutro Abs: 0.9 10*3/uL — ABNORMAL LOW (ref 1.7–7.7)
Neutrophils Relative %: 47 %
Platelet Count: 184 10*3/uL (ref 150–400)
RBC: 2.85 MIL/uL — ABNORMAL LOW (ref 4.22–5.81)
RDW: 15.4 % (ref 11.5–15.5)
WBC Count: 1.8 10*3/uL — ABNORMAL LOW (ref 4.0–10.5)
nRBC: 1.7 % — ABNORMAL HIGH (ref 0.0–0.2)

## 2019-01-05 NOTE — Progress Notes (Signed)
  Radiation Oncology         [phone] ________________________________  Name: Joe Cole MRN: [id number]  Date: 01/05/2019  DOB: 08/25/1950   End of Treatment Note  Diagnosis:   68 yo man with NSCLC of the right lower lung     Indication for treatment:  Curative, Chemo-Radiotherapy       Radiation treatment dates:   11/19/18-01/05/19  Site/dose:   The primary tumor and involved mediastinal adenopathy were treated to 66 Gy in 33 fractions of 2 Gy.  Beams/energy:   A five field 3D conformal treatment arrangement was used delivering 6 and 10 MV photons.  Daily image-guidance CT was used to align the treatment with the targeted volume  Narrative: The patient tolerated radiation treatment relatively well.  The patient experienced some esophagitis characterized as mild.  The patient also noted fatigue.  Patient states that he gets sob sometime. Patient denied any difficulty with swallowing. Patient stated that his appetite was good. Patient denied any coughing. Patient denied any issues with his skin  Plan: The patient has completed radiation treatment. The patient will return to radiation oncology clinic for routine followup in one month. I advised him to call or return sooner if he has any questions or concerns related to his recovery or treatment.  ________________________________  Sheral Apley. Tammi Klippel, M.D.

## 2019-01-05 NOTE — Telephone Encounter (Signed)
Oncology Nurse Navigator Documentation  Oncology Nurse Navigator Flowsheets 01/05/2019  Diagnosis Status -  Planned Course of Treatment -  Navigator Follow Up Date: -  Navigator Follow Up Reason: -  Navigator Location [facility]  Referral Date to RadOnc/MedOnc -  Navigator Encounter Type Telephone/I received a message from Ms. Zumstein with concerns about why her husband is not getting his chemo.  I called and spoke with her to update on rational for this.  She verbalized understanding of next steps.    Telephone Outgoing Call  Abnormal Finding Date -  Confirmed Diagnosis Date -  Multidisiplinary Clinic Date -  Treatment Initiated Date -  Patient Visit Type -  Treatment Phase Treatment  Barriers/Navigation Needs Education  Education Other  Interventions Education  Coordination of Care -  Education Method Verbal  Support Groups/Services -  Acuity Level 1  Time Spent with Patient 30

## 2019-01-07 ENCOUNTER — Other Ambulatory Visit (HOSPITAL_COMMUNITY)
Admission: RE | Admit: 2019-01-07 | Discharge: 2019-01-07 | Disposition: A | Discharge status: Home / Self Care | Payer: Medicare HMO | Source: Ambulatory Visit | Attending: Cardiovascular Disease | Admitting: Cardiovascular Disease

## 2019-01-07 DIAGNOSIS — Z20828 Contact with and (suspected) exposure to other viral communicable diseases: Secondary | ICD-10-CM | POA: Insufficient documentation

## 2019-01-07 DIAGNOSIS — Z01812 Encounter for preprocedural laboratory examination: Secondary | ICD-10-CM | POA: Diagnosis not present

## 2019-01-08 LAB — NOVEL CORONAVIRUS, NAA (HOSP ORDER, SEND-OUT TO REF LAB; TAT 18-24 HRS): SARS-CoV-2, NAA: NOT DETECTED

## 2019-01-08 NOTE — Progress Notes (Signed)
Attempted pre call for procedure on Monday 9/14, no answer.

## 2019-01-11 ENCOUNTER — Encounter (HOSPITAL_COMMUNITY): Payer: Self-pay | Admitting: Physician Assistant

## 2019-01-11 ENCOUNTER — Ambulatory Visit (HOSPITAL_COMMUNITY): Payer: Medicare HMO | Admitting: Anesthesiology

## 2019-01-11 ENCOUNTER — Ambulatory Visit (HOSPITAL_BASED_OUTPATIENT_CLINIC_OR_DEPARTMENT_OTHER)
Admission: RE | Admit: 2019-01-11 | Discharge: 2019-01-11 | Disposition: A | Discharge status: Home / Self Care | Payer: Medicare HMO | Source: Ambulatory Visit | Attending: Physician Assistant | Admitting: Physician Assistant

## 2019-01-11 ENCOUNTER — Other Ambulatory Visit: Payer: Self-pay

## 2019-01-11 ENCOUNTER — Encounter (HOSPITAL_COMMUNITY)
Admission: RE | Disposition: A | Discharge status: Home / Self Care | Payer: Self-pay | Source: Home / Self Care | Attending: Cardiovascular Disease

## 2019-01-11 ENCOUNTER — Ambulatory Visit (HOSPITAL_COMMUNITY)
Admission: RE | Admit: 2019-01-11 | Discharge: 2019-01-11 | Disposition: A | Discharge status: Home / Self Care | Payer: Medicare HMO | Attending: Cardiovascular Disease | Admitting: Cardiovascular Disease

## 2019-01-11 VITALS — BP 124/86 | HR 97 | Ht 73.0 in | Wt 178.2 lb

## 2019-01-11 DIAGNOSIS — M1712 Unilateral primary osteoarthritis, left knee: Secondary | ICD-10-CM | POA: Insufficient documentation

## 2019-01-11 DIAGNOSIS — E785 Hyperlipidemia, unspecified: Secondary | ICD-10-CM | POA: Diagnosis not present

## 2019-01-11 DIAGNOSIS — E119 Type 2 diabetes mellitus without complications: Secondary | ICD-10-CM | POA: Insufficient documentation

## 2019-01-11 DIAGNOSIS — J439 Emphysema, unspecified: Secondary | ICD-10-CM | POA: Diagnosis not present

## 2019-01-11 DIAGNOSIS — Z87891 Personal history of nicotine dependence: Secondary | ICD-10-CM | POA: Insufficient documentation

## 2019-01-11 DIAGNOSIS — Z8249 Family history of ischemic heart disease and other diseases of the circulatory system: Secondary | ICD-10-CM | POA: Diagnosis not present

## 2019-01-11 DIAGNOSIS — I4891 Unspecified atrial fibrillation: Secondary | ICD-10-CM

## 2019-01-11 DIAGNOSIS — I4819 Other persistent atrial fibrillation: Secondary | ICD-10-CM | POA: Insufficient documentation

## 2019-01-11 DIAGNOSIS — C3431 Malignant neoplasm of lower lobe, right bronchus or lung: Secondary | ICD-10-CM | POA: Diagnosis not present

## 2019-01-11 DIAGNOSIS — G4700 Insomnia, unspecified: Secondary | ICD-10-CM | POA: Diagnosis not present

## 2019-01-11 DIAGNOSIS — Z7901 Long term (current) use of anticoagulants: Secondary | ICD-10-CM | POA: Diagnosis not present

## 2019-01-11 DIAGNOSIS — M1990 Unspecified osteoarthritis, unspecified site: Secondary | ICD-10-CM | POA: Diagnosis not present

## 2019-01-11 DIAGNOSIS — Z825 Family history of asthma and other chronic lower respiratory diseases: Secondary | ICD-10-CM | POA: Diagnosis not present

## 2019-01-11 DIAGNOSIS — I10 Essential (primary) hypertension: Secondary | ICD-10-CM | POA: Diagnosis not present

## 2019-01-11 DIAGNOSIS — Z96652 Presence of left artificial knee joint: Secondary | ICD-10-CM | POA: Diagnosis not present

## 2019-01-11 DIAGNOSIS — Z79899 Other long term (current) drug therapy: Secondary | ICD-10-CM | POA: Diagnosis not present

## 2019-01-11 DIAGNOSIS — J449 Chronic obstructive pulmonary disease, unspecified: Secondary | ICD-10-CM | POA: Diagnosis not present

## 2019-01-11 HISTORY — PX: CARDIOVERSION: SHX1299

## 2019-01-11 SURGERY — CARDIOVERSION
Anesthesia: General

## 2019-01-11 MED ORDER — LIDOCAINE 2% (20 MG/ML) 5 ML SYRINGE
INTRAMUSCULAR | Status: DC | PRN
  Administered 2019-01-11: 20 mg via INTRAVENOUS

## 2019-01-11 MED ORDER — PROPOFOL 10 MG/ML IV BOLUS
INTRAVENOUS | Status: DC | PRN
  Administered 2019-01-11: 60 mg via INTRAVENOUS

## 2019-01-11 MED ORDER — SODIUM CHLORIDE 0.9 % IV SOLN
INTRAVENOUS | Status: AC | PRN
  Administered 2019-01-11: 500 mL via INTRAMUSCULAR

## 2019-01-11 NOTE — Anesthesia Preprocedure Evaluation (Signed)
Anesthesia Evaluation  Patient identified by MRN, date of birth, ID band Patient awake    Reviewed: Allergy & Precautions, NPO status , Patient's Chart, lab work & pertinent test results, reviewed documented beta blocker date and time   Airway Mallampati: II  TM Distance: >3 FB     Dental  (+) Dental Advisory Given   Pulmonary COPD, former smoker,    breath sounds clear to auscultation       Cardiovascular hypertension, Pt. on medications and Pt. on home beta blockers + dysrhythmias Atrial Fibrillation  Rhythm:Regular Rate:Normal  EF 50-55%. Mildly depressed RV   Neuro/Psych negative neurological ROS     GI/Hepatic negative GI ROS, Neg liver ROS,   Endo/Other  negative endocrine ROS  Renal/GU negative Renal ROS     Musculoskeletal   Abdominal   Peds  Hematology negative hematology ROS (+)   Anesthesia Other Findings   Reproductive/Obstetrics                             Anesthesia Physical Anesthesia Plan  ASA: III  Anesthesia Plan: General   Post-op Pain Management:    Induction: Intravenous  PONV Risk Score and Plan: 2 and Propofol infusion, Ondansetron and Treatment may vary due to age or medical condition  Airway Management Planned: Natural Airway and Mask  Additional Equipment:   Intra-op Plan:   Post-operative Plan:   Informed Consent: I have reviewed the patients History and Physical, chart, labs and discussed the procedure including the risks, benefits and alternatives for the proposed anesthesia with the patient or authorized representative who has indicated his/her understanding and acceptance.       Plan Discussed with: CRNA  Anesthesia Plan Comments:         Anesthesia Quick Evaluation

## 2019-01-11 NOTE — Interval H&P Note (Signed)
History and Physical Interval Note:  01/11/2019 9:46 AM  Joe Cole  has presented today for surgery, with the diagnosis of AFIB.  The various methods of treatment have been discussed with the patient and family. After consideration of risks, benefits and other options for treatment, the patient has consented to  Procedure(s): CARDIOVERSION (N/A) as a surgical intervention.  The patient's history has been reviewed, patient examined, no change in status, stable for surgery.  I have reviewed the patient's chart and labs.  Questions were answered to the patient's satisfaction.     Jenkins Rouge

## 2019-01-11 NOTE — Progress Notes (Signed)
Primary Care Physician: [name], MD Primary Cardiologist: Dr Marlou Porch Primary Electrophysiologist: none Referring Physician: Cecilie Kicks NP   Joe Cole is a 68 y.o. male with a history of tobacco abuse, HTN, HLD, COPD w/ emphysema, persistent atrial fibrillation, and lung cancer who presents for consultation in the [facility].  The patient was initially diagnosed with atrial fibrillation on 11/05/18 after presenting to the hospital for bronchoscopy. His heart rate was 140 bpm but the patient was asymptomatic. He does admit to swelling in his legs which has worsened over the last couple months. He was recently started on Toprol for better rate control. He denies any missed doses of anticoagulation. He denies significant snoring. He does admit to alcohol use, 1-2 drinks daily.   On follow up today, patient remains in afib although his HR is better controlled on the higher dose of BB. He continues to have symptoms of fatigue and SOB.   Today, he denies symptoms of palpitations, chest pain, orthopnea, PND, dizziness, presyncope, syncope, snoring, daytime somnolence, bleeding, or neurologic sequela. The patient is tolerating medications without difficulties and is otherwise without complaint today.    Atrial Fibrillation Risk Factors:  he does not have symptoms or diagnosis of sleep apnea. he does not have a history of rheumatic fever. he does have a history of alcohol use. The patient does have a history of early familial atrial fibrillation or other arrhythmias. Mother had afib.  he has a BMI of Body mass index is 23.51 kg/m.Marland Kitchen Filed Weights   01/11/19 0847  Weight: 80.8 kg    Family History  Problem Relation Age of Onset  . Atrial fibrillation Mother   . Emphysema Father   . Pneumonia Father      Atrial Fibrillation Management history:  Previous antiarrhythmic drugs: none Previous cardioversions: none Previous ablations: none  CHADS2VASC score: 2 Anticoagulation history: Eliquis   Past Medical History:  Diagnosis Date  . Arthritis   . Atrial fibrillation ([place]) 10/2018  . COPD with emphysema ([place])   . Dyspnea   . Hypertension    Past Surgical History:  Procedure Laterality Date  . KNEE ARTHROSCOPY     BIL   . TOE SURGERY     PINNED LEFT BIG TOE  . TONSILLECTOMY     T+A  AS CHILD  . TOTAL KNEE ARTHROPLASTY Left 08/19/2017   Procedure: TOTAL KNEE ARTHROPLASTY;  Surgeon: Renette Butters, MD;  Location: [facility];  Service: Orthopedics;  Laterality: Left;  Marland Kitchen VIDEO BRONCHOSCOPY Bilateral 11/05/2018   Procedure: VIDEO BRONCHOSCOPY WITH FLUORO;  Surgeon: [name], MD;  Location: [facility] ENDOSCOPY;  Service: Cardiopulmonary;  Laterality: Bilateral;  . VIDEO BRONCHOSCOPY Bilateral 11/09/2018   Procedure: VIDEO BRONCHOSCOPY WITH FLUORO;  Surgeon: Rigoberto Noel, MD;  Location: [facility];  Service: Cardiopulmonary;  Laterality: Bilateral;    Current Outpatient Medications  Medication Sig Dispense Refill  . acetaminophen (TYLENOL) 500 MG tablet Take 1,000 mg by mouth every 6 (six) hours as needed for moderate pain or headache.    . albuterol (PROAIR HFA) 108 (90 Base) MCG/ACT inhaler Inhale 2 puffs into the lungs every 6 (six) hours as needed for wheezing or shortness of breath. 1 Inhaler 5  . apixaban (ELIQUIS) 5 MG TABS tablet Take 1 tablet (5 mg total) by mouth 2 (two) times daily. 60 tablet 3  . atorvastatin (LIPITOR) 20 MG tablet Take 20 mg by mouth daily.    Marland Kitchen diltiazem (CARDIZEM CD) 360 MG 24  hr capsule Take 1 capsule (360 mg total) by mouth daily. 30 capsule 3  . metoprolol succinate (TOPROL XL) 25 MG 24 hr tablet Take 1 tablet (25 mg total) by mouth at bedtime. 30 tablet 11  . metoprolol succinate (TOPROL XL) 50 MG 24 hr tablet Take 1 tablet (50 mg total) by mouth at bedtime. Take with or immediately following a meal. 30 tablet 11  . Probiotic Product (ALIGN) 4 MG CAPS Take 4 mg by mouth daily.    .  prochlorperazine (COMPAZINE) 10 MG tablet Take 1 tablet (10 mg total) by mouth every 6 (six) hours as needed for nausea or vomiting. 30 tablet 0  . traZODone (DESYREL) 50 MG tablet TAKE 1 TABLET BY MOUTH EVERYDAY AT BEDTIME (Patient taking differently: Take 50 mg by mouth at bedtime as needed for sleep. ) 90 tablet 1  . umeclidinium-vilanterol (ANORO ELLIPTA) 62.5-25 MCG/INH AEPB Inhale 1 puff into the lungs daily. 1 each 6   No current facility-administered medications for this encounter.     No Known Allergies  Social History   Socioeconomic History  . Marital status: Married    Spouse name: Not on file  . Number of children: Not on file  . Years of education: Not on file  . Highest education level: Not on file  Occupational History  . Not on file  Social Needs  . Financial resource strain: Not on file  . Food insecurity    Worry: Not on file    Inability: Not on file  . Transportation needs    Medical: Not on file    Non-medical: Not on file  Tobacco Use  . Smoking status: Former Smoker    Packs/day: 1.50    Years: 46.00    Pack years: 69.00    Types: Cigarettes    Quit date: 09/08/2018    Years since quitting: 0.3  . Smokeless tobacco: Never Used  Substance and Sexual Activity  . Alcohol use: Yes    Alcohol/week: 19.0 standard drinks    Types: 7 Cans of beer, 12 Shots of liquor per week    Comment: FEW DRINKS AFTER WORK  . Drug use: Never  . Sexual activity: Not on file  Lifestyle  . Physical activity    Days per week: Not on file    Minutes per session: Not on file  . Stress: Not on file  Relationships  . Social Herbalist on phone: Not on file    Gets together: Not on file    Attends religious service: Not on file    Active member of club or organization: Not on file    Attends meetings of clubs or organizations: Not on file    Relationship status: Not on file  . Intimate partner violence    Fear of current or ex partner: Not on file     Emotionally abused: Not on file    Physically abused: Not on file    Forced sexual activity: Not on file  Other Topics Concern  . Not on file  Social History Narrative  . Not on file     ROS- All systems are reviewed and negative except as per the HPI above.  Physical Exam: Vitals:   01/11/19 0847  BP: 124/86  Pulse: 97  Weight: 80.8 kg  Height: 6\' 1"  (1.854 m)    GEN- The patient is well appearing, alert and oriented x 3 today.   HEENT-head normocephalic, atraumatic, sclera clear, conjunctiva pink,  hearing intact, trachea midline. Lungs- Clear to ausculation bilaterally, diminished breath sounds, normal work of breathing Heart- irregular rate and rhythm, no murmurs, rubs or gallops  GI- soft, NT, ND, + BS Extremities- no clubbing, cyanosis. 1+ edema bilaterally. MS- no significant deformity or atrophy Skin- no rash or lesion Psych- euthymic mood, full affect Neuro- strength and sensation are intact   Wt Readings from Last 3 Encounters:  01/11/19 80.8 kg  12/28/18 80.4 kg  12/21/18 80.4 kg    EKG today demonstrates afib with PVCs HR 97, QRS 88, QTc 457.  Echo 11/06/18 demonstrated  1. The left ventricle has low normal systolic function, with an ejection fraction of 50-55%. The cavity size was mildly dilated. Left ventricular diastolic Doppler parameters are indeterminate.  2. Extremely poor acoustic windows limit study.  3. The right ventricle was not well visualized. The cavity was moderately enlarged. There is not assessed.  4. RV is not well seen RVEF is at least mildly depressed.  5. Left atrial size was severely dilated.  6. Right atrial size was severely dilated.  7. The mitral valve is abnormal. Mild thickening of the mitral valve leaflet. There is mild mitral annular calcification present.  8. The tricuspid valve is grossly normal.  9. The aortic valve is abnormal. Mild thickening of the aortic valve. Mild calcification of the aortic valve. Aortic valve  regurgitation is trivial by color flow Doppler. 10. The inferior vena cava was dilated in size with <50% respiratory variability.  Epic records are reviewed at length today  Assessment and Plan:  1. Persistent atrial fibrillation Patient remains in rate controlled afib. Patient scheduled for DCCV today. Recent Bmet/CBC reviewed. It may be difficult to maintain SR given lung disease and severe biatrial enlargement. If patient has ERAF, may need to consider AAD. Continue Eliquis 5 mg BID. Patient reports no missed doses. Continue diltiazem 360 mg daily Continue Toprol to 75 mg daily  Lifestyle changes including reducing ETOH intake to 2 drinks per week or less.  This patients CHA2DS2-VASc Score and unadjusted Ischemic Stroke Rate (% per year) is equal to 2.2 % stroke rate/year from a score of 2  Above score calculated as 1 point each if present [CHF, HTN, DM, Vascular=MI/PAD/Aortic Plaque, Age if 65-74, or Male] Above score calculated as 2 points each if present [Age > 75, or Stroke/TIA/TE]   2. HTN Stable, no changes today.  3. Lung Cancer Plans per oncology.    Follow up in AF clinic one week post DCCV.   [facility] [address] [place], [place] [address] [phone] 01/11/2019 9:17 AM

## 2019-01-11 NOTE — H&P (View-Only) (Signed)
Primary Care Physician: [name], MD Primary Cardiologist: Dr Marlou Porch Primary Electrophysiologist: none Referring Physician: Cecilie Kicks NP   Joe Cole is a 68 y.o. male with a history of tobacco abuse, HTN, HLD, COPD w/ emphysema, persistent atrial fibrillation, and lung cancer who presents for consultation in the [facility].  The patient was initially diagnosed with atrial fibrillation on 11/05/18 after presenting to the hospital for bronchoscopy. His heart rate was 140 bpm but the patient was asymptomatic. He does admit to swelling in his legs which has worsened over the last couple months. He was recently started on Toprol for better rate control. He denies any missed doses of anticoagulation. He denies significant snoring. He does admit to alcohol use, 1-2 drinks daily.   On follow up today, patient remains in afib although his HR is better controlled on the higher dose of BB. He continues to have symptoms of fatigue and SOB.   Today, he denies symptoms of palpitations, chest pain, orthopnea, PND, dizziness, presyncope, syncope, snoring, daytime somnolence, bleeding, or neurologic sequela. The patient is tolerating medications without difficulties and is otherwise without complaint today.    Atrial Fibrillation Risk Factors:  he does not have symptoms or diagnosis of sleep apnea. he does not have a history of rheumatic fever. he does have a history of alcohol use. The patient does have a history of early familial atrial fibrillation or other arrhythmias. Mother had afib.  he has a BMI of Body mass index is 23.51 kg/m.Marland Kitchen Filed Weights   01/11/19 0847  Weight: 80.8 kg    Family History  Problem Relation Age of Onset  . Atrial fibrillation Mother   . Emphysema Father   . Pneumonia Father      Atrial Fibrillation Management history:  Previous antiarrhythmic drugs: none Previous cardioversions: none Previous ablations: none  CHADS2VASC score: 2 Anticoagulation history: Eliquis   Past Medical History:  Diagnosis Date  . Arthritis   . Atrial fibrillation ([place]) 10/2018  . COPD with emphysema ([place])   . Dyspnea   . Hypertension    Past Surgical History:  Procedure Laterality Date  . KNEE ARTHROSCOPY     BIL   . TOE SURGERY     PINNED LEFT BIG TOE  . TONSILLECTOMY     T+A  AS CHILD  . TOTAL KNEE ARTHROPLASTY Left 08/19/2017   Procedure: TOTAL KNEE ARTHROPLASTY;  Surgeon: Renette Butters, MD;  Location: [facility];  Service: Orthopedics;  Laterality: Left;  Marland Kitchen VIDEO BRONCHOSCOPY Bilateral 11/05/2018   Procedure: VIDEO BRONCHOSCOPY WITH FLUORO;  Surgeon: [name], MD;  Location: [facility] ENDOSCOPY;  Service: Cardiopulmonary;  Laterality: Bilateral;  . VIDEO BRONCHOSCOPY Bilateral 11/09/2018   Procedure: VIDEO BRONCHOSCOPY WITH FLUORO;  Surgeon: Rigoberto Noel, MD;  Location: [facility];  Service: Cardiopulmonary;  Laterality: Bilateral;    Current Outpatient Medications  Medication Sig Dispense Refill  . acetaminophen (TYLENOL) 500 MG tablet Take 1,000 mg by mouth every 6 (six) hours as needed for moderate pain or headache.    . albuterol (PROAIR HFA) 108 (90 Base) MCG/ACT inhaler Inhale 2 puffs into the lungs every 6 (six) hours as needed for wheezing or shortness of breath. 1 Inhaler 5  . apixaban (ELIQUIS) 5 MG TABS tablet Take 1 tablet (5 mg total) by mouth 2 (two) times daily. 60 tablet 3  . atorvastatin (LIPITOR) 20 MG tablet Take 20 mg by mouth daily.    Marland Kitchen diltiazem (CARDIZEM CD) 360 MG 24  hr capsule Take 1 capsule (360 mg total) by mouth daily. 30 capsule 3  . metoprolol succinate (TOPROL XL) 25 MG 24 hr tablet Take 1 tablet (25 mg total) by mouth at bedtime. 30 tablet 11  . metoprolol succinate (TOPROL XL) 50 MG 24 hr tablet Take 1 tablet (50 mg total) by mouth at bedtime. Take with or immediately following a meal. 30 tablet 11  . Probiotic Product (ALIGN) 4 MG CAPS Take 4 mg by mouth daily.    .  prochlorperazine (COMPAZINE) 10 MG tablet Take 1 tablet (10 mg total) by mouth every 6 (six) hours as needed for nausea or vomiting. 30 tablet 0  . traZODone (DESYREL) 50 MG tablet TAKE 1 TABLET BY MOUTH EVERYDAY AT BEDTIME (Patient taking differently: Take 50 mg by mouth at bedtime as needed for sleep. ) 90 tablet 1  . umeclidinium-vilanterol (ANORO ELLIPTA) 62.5-25 MCG/INH AEPB Inhale 1 puff into the lungs daily. 1 each 6   No current facility-administered medications for this encounter.     No Known Allergies  Social History   Socioeconomic History  . Marital status: Married    Spouse name: Not on file  . Number of children: Not on file  . Years of education: Not on file  . Highest education level: Not on file  Occupational History  . Not on file  Social Needs  . Financial resource strain: Not on file  . Food insecurity    Worry: Not on file    Inability: Not on file  . Transportation needs    Medical: Not on file    Non-medical: Not on file  Tobacco Use  . Smoking status: Former Smoker    Packs/day: 1.50    Years: 46.00    Pack years: 69.00    Types: Cigarettes    Quit date: 09/08/2018    Years since quitting: 0.3  . Smokeless tobacco: Never Used  Substance and Sexual Activity  . Alcohol use: Yes    Alcohol/week: 19.0 standard drinks    Types: 7 Cans of beer, 12 Shots of liquor per week    Comment: FEW DRINKS AFTER WORK  . Drug use: Never  . Sexual activity: Not on file  Lifestyle  . Physical activity    Days per week: Not on file    Minutes per session: Not on file  . Stress: Not on file  Relationships  . Social Herbalist on phone: Not on file    Gets together: Not on file    Attends religious service: Not on file    Active member of club or organization: Not on file    Attends meetings of clubs or organizations: Not on file    Relationship status: Not on file  . Intimate partner violence    Fear of current or ex partner: Not on file     Emotionally abused: Not on file    Physically abused: Not on file    Forced sexual activity: Not on file  Other Topics Concern  . Not on file  Social History Narrative  . Not on file     ROS- All systems are reviewed and negative except as per the HPI above.  Physical Exam: Vitals:   01/11/19 0847  BP: 124/86  Pulse: 97  Weight: 80.8 kg  Height: 6\' 1"  (1.854 m)    GEN- The patient is well appearing, alert and oriented x 3 today.   HEENT-head normocephalic, atraumatic, sclera clear, conjunctiva pink,  hearing intact, trachea midline. Lungs- Clear to ausculation bilaterally, diminished breath sounds, normal work of breathing Heart- irregular rate and rhythm, no murmurs, rubs or gallops  GI- soft, NT, ND, + BS Extremities- no clubbing, cyanosis. 1+ edema bilaterally. MS- no significant deformity or atrophy Skin- no rash or lesion Psych- euthymic mood, full affect Neuro- strength and sensation are intact   Wt Readings from Last 3 Encounters:  01/11/19 80.8 kg  12/28/18 80.4 kg  12/21/18 80.4 kg    EKG today demonstrates afib with PVCs HR 97, QRS 88, QTc 457.  Echo 11/06/18 demonstrated  1. The left ventricle has low normal systolic function, with an ejection fraction of 50-55%. The cavity size was mildly dilated. Left ventricular diastolic Doppler parameters are indeterminate.  2. Extremely poor acoustic windows limit study.  3. The right ventricle was not well visualized. The cavity was moderately enlarged. There is not assessed.  4. RV is not well seen RVEF is at least mildly depressed.  5. Left atrial size was severely dilated.  6. Right atrial size was severely dilated.  7. The mitral valve is abnormal. Mild thickening of the mitral valve leaflet. There is mild mitral annular calcification present.  8. The tricuspid valve is grossly normal.  9. The aortic valve is abnormal. Mild thickening of the aortic valve. Mild calcification of the aortic valve. Aortic valve  regurgitation is trivial by color flow Doppler. 10. The inferior vena cava was dilated in size with <50% respiratory variability.  Epic records are reviewed at length today  Assessment and Plan:  1. Persistent atrial fibrillation Patient remains in rate controlled afib. Patient scheduled for DCCV today. Recent Bmet/CBC reviewed. It may be difficult to maintain SR given lung disease and severe biatrial enlargement. If patient has ERAF, may need to consider AAD. Continue Eliquis 5 mg BID. Patient reports no missed doses. Continue diltiazem 360 mg daily Continue Toprol to 75 mg daily  Lifestyle changes including reducing ETOH intake to 2 drinks per week or less.  This patients CHA2DS2-VASc Score and unadjusted Ischemic Stroke Rate (% per year) is equal to 2.2 % stroke rate/year from a score of 2  Above score calculated as 1 point each if present [CHF, HTN, DM, Vascular=MI/PAD/Aortic Plaque, Age if 65-74, or Male] Above score calculated as 2 points each if present [Age > 75, or Stroke/TIA/TE]   2. HTN Stable, no changes today.  3. Lung Cancer Plans per oncology.    Follow up in AF clinic one week post DCCV.   [facility] [address] [place], [place] [address] [phone] 01/11/2019 9:17 AM

## 2019-01-11 NOTE — CV Procedure (Signed)
[facility]ellers: Anesthesia:  Propofol On Rx Anticoagulation with no missed doses  DCC x 2 150 then 200 J Converted to NSR rates 70's  No immediate neurologic sequelae  Jenkins Rouge

## 2019-01-11 NOTE — Discharge Instructions (Signed)

## 2019-01-11 NOTE — Transfer of Care (Signed)
Immediate Anesthesia Transfer of Care Note  Patient: Joe Cole  Procedure(s) Performed: CARDIOVERSION (N/A )  Patient Location: Endoscopy Unit  Anesthesia Type:General  Level of Consciousness: drowsy and patient cooperative  Airway & Oxygen Therapy: Patient Spontanous Breathing  Post-op Assessment: Report given to RN and Post -op Vital signs reviewed and stable  Post vital signs: Reviewed and stable  Last Vitals:  Vitals Value Taken Time  BP    Temp    Pulse    Resp    SpO2      Last Pain:  Vitals:   01/11/19 0949  PainSc: 0-No pain         Complications: No apparent anesthesia complications

## 2019-01-12 ENCOUNTER — Encounter (HOSPITAL_COMMUNITY): Payer: Self-pay | Admitting: Cardiovascular Disease

## 2019-01-12 NOTE — Anesthesia Postprocedure Evaluation (Signed)
Anesthesia Post Note  Patient: Joe Cole  Procedure(s) Performed: CARDIOVERSION (N/A )     Patient location during evaluation: PACU Anesthesia Type: General Level of consciousness: awake and alert Pain management: pain level controlled Vital Signs Assessment: post-procedure vital signs reviewed and stable Respiratory status: spontaneous breathing, nonlabored ventilation, respiratory function stable and patient connected to nasal cannula oxygen Cardiovascular status: blood pressure returned to baseline and stable Postop Assessment: no apparent nausea or vomiting Anesthetic complications: no    Last Vitals:  Vitals:   01/11/19 1040 01/11/19 1050  BP: 114/68 123/79  Pulse: 70 73  Resp: (!) 22 19  SpO2: 96% 99%    Last Pain:  Vitals:   01/11/19 1050  PainSc: 0-No pain                 Tiajuana Amass

## 2019-01-13 ENCOUNTER — Ambulatory Visit: Payer: Self-pay | Admitting: Pharmacist

## 2019-01-20 ENCOUNTER — Encounter (HOSPITAL_COMMUNITY): Payer: Self-pay | Admitting: Physician Assistant

## 2019-01-20 ENCOUNTER — Other Ambulatory Visit: Payer: Self-pay

## 2019-01-20 ENCOUNTER — Ambulatory Visit (HOSPITAL_COMMUNITY)
Admission: RE | Admit: 2019-01-20 | Discharge: 2019-01-20 | Disposition: A | Discharge status: Home / Self Care | Payer: Medicare HMO | Source: Ambulatory Visit | Attending: Physician Assistant | Admitting: Physician Assistant

## 2019-01-20 VITALS — BP 120/70 | HR 94 | Ht 73.0 in | Wt 183.8 lb

## 2019-01-20 DIAGNOSIS — C3490 Malignant neoplasm of unspecified part of unspecified bronchus or lung: Secondary | ICD-10-CM | POA: Diagnosis not present

## 2019-01-20 DIAGNOSIS — I4819 Other persistent atrial fibrillation: Secondary | ICD-10-CM | POA: Insufficient documentation

## 2019-01-20 DIAGNOSIS — E785 Hyperlipidemia, unspecified: Secondary | ICD-10-CM | POA: Insufficient documentation

## 2019-01-20 DIAGNOSIS — Z7901 Long term (current) use of anticoagulants: Secondary | ICD-10-CM | POA: Diagnosis not present

## 2019-01-20 DIAGNOSIS — Z87891 Personal history of nicotine dependence: Secondary | ICD-10-CM | POA: Diagnosis not present

## 2019-01-20 DIAGNOSIS — Z79899 Other long term (current) drug therapy: Secondary | ICD-10-CM | POA: Insufficient documentation

## 2019-01-20 DIAGNOSIS — M7989 Other specified soft tissue disorders: Secondary | ICD-10-CM | POA: Insufficient documentation

## 2019-01-20 DIAGNOSIS — J439 Emphysema, unspecified: Secondary | ICD-10-CM | POA: Insufficient documentation

## 2019-01-20 DIAGNOSIS — I10 Essential (primary) hypertension: Secondary | ICD-10-CM | POA: Diagnosis not present

## 2019-01-20 DIAGNOSIS — Z96652 Presence of left artificial knee joint: Secondary | ICD-10-CM | POA: Insufficient documentation

## 2019-01-20 NOTE — Progress Notes (Signed)
Primary Care Physician: [name], MD Primary Cardiologist: Dr Marlou Porch Primary Electrophysiologist: none Referring Physician: Cecilie Kicks NP   Joe Cole is a 68 y.o. male with a history of tobacco abuse, HTN, HLD, COPD w/ emphysema, persistent atrial fibrillation, and lung cancer who presents for consultation in the [facility].  The patient was initially diagnosed with atrial fibrillation on 11/05/18 after presenting to the hospital for bronchoscopy. His heart rate was 140 bpm but the patient was asymptomatic. He does admit to swelling in his legs which has worsened over the last couple months. He was recently started on Toprol for better rate control. He denies any missed doses of anticoagulation. He denies significant snoring. He does admit to alcohol use, 1-2 drinks daily.   On follow up today, patient is s/p successful DCCV on 01/12/19. Unfortunately, patient had ERAF 1-2 days later. He states that he felt only minimal improvement after the DCCV. He is in rate controlled afib today. His SOB is stable.  Today, he denies symptoms of palpitations, chest pain, orthopnea, PND, dizziness, presyncope, syncope, snoring, daytime somnolence, bleeding, or neurologic sequela. The patient is tolerating medications without difficulties and is otherwise without complaint today.    Atrial Fibrillation Risk Factors:  he does not have symptoms or diagnosis of sleep apnea. he does not have a history of rheumatic fever. he does have a history of alcohol use. The patient does have a history of early familial atrial fibrillation or other arrhythmias. Mother had afib.  he has a BMI of Body mass index is 24.25 kg/m.Marland Kitchen Filed Weights   01/20/19 0909  Weight: 83.4 kg    Family History  Problem Relation Age of Onset  . Atrial fibrillation Mother   . Emphysema Father   . Pneumonia Father      Atrial Fibrillation Management history:  Previous antiarrhythmic  drugs: none Previous cardioversions: 01/12/19 Previous ablations: none CHADS2VASC score: 2 Anticoagulation history: Eliquis   Past Medical History:  Diagnosis Date  . Arthritis   . Atrial fibrillation ([place]) 10/2018  . COPD with emphysema ([place])   . Dyspnea   . Hypertension    Past Surgical History:  Procedure Laterality Date  . CARDIOVERSION N/A 01/11/2019   Procedure: CARDIOVERSION;  Surgeon: Josue Hector, MD;  Location: [facility] ENDOSCOPY;  Service: Cardiovascular;  Laterality: N/A;  . KNEE ARTHROSCOPY     BIL   . TOE SURGERY     PINNED LEFT BIG TOE  . TONSILLECTOMY     T+A  AS CHILD  . TOTAL KNEE ARTHROPLASTY Left 08/19/2017   Procedure: TOTAL KNEE ARTHROPLASTY;  Surgeon: Renette Butters, MD;  Location: [place];  Service: Orthopedics;  Laterality: Left;  Marland Kitchen VIDEO BRONCHOSCOPY Bilateral 11/05/2018   Procedure: VIDEO BRONCHOSCOPY WITH FLUORO;  Surgeon: [name], MD;  Location: [facility] ENDOSCOPY;  Service: Cardiopulmonary;  Laterality: Bilateral;  . VIDEO BRONCHOSCOPY Bilateral 11/09/2018   Procedure: VIDEO BRONCHOSCOPY WITH FLUORO;  Surgeon: Rigoberto Noel, MD;  Location: [place];  Service: Cardiopulmonary;  Laterality: Bilateral;    Current Outpatient Medications  Medication Sig Dispense Refill  . acetaminophen (TYLENOL) 500 MG tablet Take 1,000 mg by mouth every 6 (six) hours as needed for moderate pain or headache.    . albuterol (PROAIR HFA) 108 (90 Base) MCG/ACT inhaler Inhale 2 puffs into the lungs every 6 (six) hours as needed for wheezing or shortness of breath. 1 Inhaler 5  . apixaban (ELIQUIS) 5 MG TABS tablet Take 1 tablet (  5 mg total) by mouth 2 (two) times daily. 60 tablet 3  . atorvastatin (LIPITOR) 20 MG tablet Take 20 mg by mouth daily.    Marland Kitchen diltiazem (CARDIZEM CD) 360 MG 24 hr capsule Take 1 capsule (360 mg total) by mouth daily. 30 capsule 3  . metoprolol succinate (TOPROL XL) 25 MG 24 hr tablet Take 1 tablet (25 mg total) by mouth at bedtime. 30 tablet 11  .  metoprolol succinate (TOPROL XL) 50 MG 24 hr tablet Take 1 tablet (50 mg total) by mouth at bedtime. Take with or immediately following a meal. 30 tablet 11  . Probiotic Product (ALIGN) 4 MG CAPS Take 4 mg by mouth daily.    . prochlorperazine (COMPAZINE) 10 MG tablet Take 1 tablet (10 mg total) by mouth every 6 (six) hours as needed for nausea or vomiting. 30 tablet 0  . traZODone (DESYREL) 50 MG tablet TAKE 1 TABLET BY MOUTH EVERYDAY AT BEDTIME (Patient taking differently: Take 50 mg by mouth at bedtime as needed for sleep. ) 90 tablet 1  . umeclidinium-vilanterol (ANORO ELLIPTA) 62.5-25 MCG/INH AEPB Inhale 1 puff into the lungs daily. 1 each 6   No current facility-administered medications for this encounter.     No Known Allergies  Social History   Socioeconomic History  . Marital status: Married    Spouse name: Not on file  . Number of children: Not on file  . Years of education: Not on file  . Highest education level: Not on file  Occupational History  . Not on file  Social Needs  . Financial resource strain: Not on file  . Food insecurity    Worry: Not on file    Inability: Not on file  . Transportation needs    Medical: Not on file    Non-medical: Not on file  Tobacco Use  . Smoking status: Former Smoker    Packs/day: 1.50    Years: 46.00    Pack years: 69.00    Types: Cigarettes    Quit date: 09/08/2018    Years since quitting: 0.3  . Smokeless tobacco: Never Used  Substance and Sexual Activity  . Alcohol use: Yes    Alcohol/week: 19.0 standard drinks    Types: 7 Cans of beer, 12 Shots of liquor per week    Comment: FEW DRINKS AFTER WORK  . Drug use: Never  . Sexual activity: Not on file  Lifestyle  . Physical activity    Days per week: Not on file    Minutes per session: Not on file  . Stress: Not on file  Relationships  . Social Herbalist on phone: Not on file    Gets together: Not on file    Attends religious service: Not on file    Active  member of club or organization: Not on file    Attends meetings of clubs or organizations: Not on file    Relationship status: Not on file  . Intimate partner violence    Fear of current or ex partner: Not on file    Emotionally abused: Not on file    Physically abused: Not on file    Forced sexual activity: Not on file  Other Topics Concern  . Not on file  Social History Narrative  . Not on file     ROS- All systems are reviewed and negative except as per the HPI above.  Physical Exam: Vitals:   01/20/19 0909  BP: 120/70  Pulse:  94  Weight: 83.4 kg  Height: 6\' 1"  (1.854 m)    GEN- The patient is well appearing, alert and oriented x 3 today.   HEENT-head normocephalic, atraumatic, sclera clear, conjunctiva pink, hearing intact, trachea midline. Lungs- Clear to ausculation bilaterally, diminished breath sounds, normal work of breathing Heart- irregular rate and rhythm, no murmurs, rubs or gallops  GI- soft, NT, ND, + BS Extremities- no clubbing, cyanosis, or edema MS- no significant deformity or atrophy Skin- no rash or lesion Psych- euthymic mood, full affect Neuro- strength and sensation are intact   Wt Readings from Last 3 Encounters:  01/20/19 83.4 kg  01/11/19 80 kg  01/11/19 80.8 kg    EKG today demonstrates afib HR 94, QRS 88, QTc 462  Echo 11/06/18 demonstrated  1. The left ventricle has low normal systolic function, with an ejection fraction of 50-55%. The cavity size was mildly dilated. Left ventricular diastolic Doppler parameters are indeterminate.  2. Extremely poor acoustic windows limit study.  3. The right ventricle was not well visualized. The cavity was moderately enlarged. There is not assessed.  4. RV is not well seen RVEF is at least mildly depressed.  5. Left atrial size was severely dilated.  6. Right atrial size was severely dilated.  7. The mitral valve is abnormal. Mild thickening of the mitral valve leaflet. There is mild mitral annular  calcification present.  8. The tricuspid valve is grossly normal.  9. The aortic valve is abnormal. Mild thickening of the aortic valve. Mild calcification of the aortic valve. Aortic valve regurgitation is trivial by color flow Doppler. 10. The inferior vena cava was dilated in size with <50% respiratory variability.  Epic records are reviewed at length today  Assessment and Plan:  1. Persistent atrial fibrillation S/p DCCV on 01/11/19. Unfortunately, patient has had ERAF. Patient has severe biatrial enlargement.  We discussed therapeutic options including AAD therapy. Patient and his wife would like to hold on starting any new medications for now given his lung disease and paucity of afib symptoms (only minimal improvement after DCCV). We discussed that the longer he is in afib the more difficult it will be to get back to SR. Patient voices understanding. Rate control for now. Continue Eliquis 5 mg BID.  Continue diltiazem 360 mg daily Continue Toprol to 75 mg daily   This patients CHA2DS2-VASc Score and unadjusted Ischemic Stroke Rate (% per year) is equal to 2.2 % stroke rate/year from a score of 2  Above score calculated as 1 point each if present [CHF, HTN, DM, Vascular=MI/PAD/Aortic Plaque, Age if 65-74, or Male] Above score calculated as 2 points each if present [Age > 75, or Stroke/TIA/TE]   2. HTN Stable, no changes today.   3. Lung Cancer Plans per oncology.    Follow up with Dr Marlou Porch as scheduled. AF clinic in 4 months.   [facility] [address] [place], [place] [address] [phone] 01/20/2019 9:31 AM

## 2019-01-26 ENCOUNTER — Encounter (HOSPITAL_COMMUNITY): Payer: Self-pay

## 2019-01-26 ENCOUNTER — Other Ambulatory Visit: Payer: Self-pay

## 2019-01-26 ENCOUNTER — Ambulatory Visit (HOSPITAL_COMMUNITY)
Admission: RE | Admit: 2019-01-26 | Discharge: 2019-01-26 | Disposition: A | Discharge status: Home / Self Care | Payer: Medicare HMO | Source: Ambulatory Visit | Attending: Internal Medicine | Admitting: Internal Medicine

## 2019-01-26 ENCOUNTER — Inpatient Hospital Stay: Payer: Medicare HMO

## 2019-01-26 DIAGNOSIS — C3490 Malignant neoplasm of unspecified part of unspecified bronchus or lung: Secondary | ICD-10-CM

## 2019-01-26 DIAGNOSIS — C3491 Malignant neoplasm of unspecified part of right bronchus or lung: Secondary | ICD-10-CM | POA: Diagnosis not present

## 2019-01-26 DIAGNOSIS — Z5111 Encounter for antineoplastic chemotherapy: Secondary | ICD-10-CM | POA: Diagnosis not present

## 2019-01-26 HISTORY — DX: Malignant (primary) neoplasm, unspecified: C80.1

## 2019-01-26 LAB — CMP (CANCER CENTER ONLY)
ALT: 20 U/L (ref 0–44)
AST: 20 U/L (ref 15–41)
Albumin: 3.5 g/dL (ref 3.5–5.0)
Alkaline Phosphatase: 79 U/L (ref 38–126)
Anion gap: 10 (ref 5–15)
BUN: 9 mg/dL (ref 8–23)
CO2: 24 mmol/L (ref 22–32)
Calcium: 9.5 mg/dL (ref 8.9–10.3)
Chloride: 103 mmol/L (ref 98–111)
Creatinine: 0.82 mg/dL (ref 0.61–1.24)
GFR, Est AFR Am: >60 mL/min (ref >60)
GFR, Estimated: >60 mL/min (ref >60)
Glucose, Bld: 101 mg/dL — ABNORMAL HIGH (ref 70–99)
Potassium: 4.5 mmol/L (ref 3.5–5.1)
Sodium: 137 mmol/L (ref 135–145)
Total Bilirubin: 0.6 mg/dL (ref 0.3–1.2)
Total Protein: 6.5 g/dL (ref 6.5–8.1)

## 2019-01-26 LAB — CBC WITH DIFFERENTIAL (CANCER CENTER ONLY)
Abs Immature Granulocytes: 0.03 10*3/uL (ref 0.00–0.07)
Basophils Absolute: 0.1 10*3/uL (ref 0.0–0.1)
Basophils Relative: 2 %
Eosinophils Absolute: 0.2 10*3/uL (ref 0.0–0.5)
Eosinophils Relative: 3 %
HCT: 34.4 % — ABNORMAL LOW (ref 39.0–52.0)
Hemoglobin: 11.8 g/dL — ABNORMAL LOW (ref 13.0–17.0)
Immature Granulocytes: 1 %
Lymphocytes Relative: 23 %
Lymphs Abs: 1.2 10*3/uL (ref 0.7–4.0)
MCH: 38.9 pg — ABNORMAL HIGH (ref 26.0–34.0)
MCHC: 34.3 g/dL (ref 30.0–36.0)
MCV: 113.5 fL — ABNORMAL HIGH (ref 80.0–100.0)
Monocytes Absolute: 0.9 10*3/uL (ref 0.1–1.0)
Monocytes Relative: 17 %
Neutro Abs: 2.7 10*3/uL (ref 1.7–7.7)
Neutrophils Relative %: 54 %
Platelet Count: 322 10*3/uL (ref 150–400)
RBC: 3.03 MIL/uL — ABNORMAL LOW (ref 4.22–5.81)
RDW: 19.3 % — ABNORMAL HIGH (ref 11.5–15.5)
WBC Count: 5.1 10*3/uL (ref 4.0–10.5)
nRBC: 0.4 % — ABNORMAL HIGH (ref 0.0–0.2)

## 2019-01-26 MED ORDER — IOHEXOL 300 MG/ML  SOLN
75.0000 mL | Freq: Once | INTRAMUSCULAR | Status: AC | PRN
  Administered 2019-01-26: 12:00:00 75 mL via INTRAVENOUS

## 2019-01-26 MED ORDER — SODIUM CHLORIDE (PF) 0.9 % IJ SOLN
INTRAMUSCULAR | Status: AC
  Filled 2019-01-26: qty 50

## 2019-01-27 ENCOUNTER — Other Ambulatory Visit: Payer: Self-pay | Admitting: Pharmacist

## 2019-01-27 NOTE — Patient Outreach (Signed)
[facility]) Care Management  01/27/2019  Joe Cole 02/13/1951 [id number]   Patient was called. His wife answered the phone. HIPAA identifiers were obtained x 2.  Patient's wife said the patient was feeling "ok". She said they have several appointments coming up and will know more about upcoming treatments.  Patient is a 68 year old male with multiple medical conditions including but not limited to: Atrial fibrillation, COPD, hypertension, insomnia, osteoarthritis of left knee, lung cancer [squamous cell carcinoma of right lung] (undergoing radiation). He underwent a cardiac ablation 01/11/2019 but it was not successful as the patient was back in Afib two days afterwards.    No recent medication changes.  We had been looking into medication assistance with Eliquis and Anoro.  Both programs require patients to spend a certain amount in medication expenses (3% of house hold income--Eliquis) ($600 [employer]-Anoro).    Patient's wife said she had not received an EOB from [place]. She said she would go to [facility] and get a print out.  Once we are able to assess the patient's spending, patient assistance applications will be sent if appropriate.  Plan: Call patient back in 10-14 business days.  Elayne Guerin, PharmD, [place] Clinical Pharmacist [phone]

## 2019-01-29 ENCOUNTER — Telehealth: Payer: Self-pay | Admitting: *Deleted

## 2019-01-29 NOTE — Telephone Encounter (Signed)
Oncology Nurse Navigator Documentation  Oncology Nurse Navigator Flowsheets 01/29/2019  Diagnosis Status -  Planned Course of Treatment -  Navigator Follow Up Date: -  Navigator Follow Up Reason: -  Navigator Location [facility]  Referral Date to RadOnc/MedOnc -  Navigator Encounter Type Telephone/I received a call from patient's wife.  She would like to be called during her husbands visit next week.  I will pass the information on to the team.    Telephone Outgoing Call  Abnormal Finding Date -  Confirmed Diagnosis Date -  Multidisiplinary Clinic Date -  Treatment Initiated Date -  Patient Visit Type -  Treatment Phase -  Barriers/Navigation Needs Coordination of Care;Education  Education Other  Interventions Coordination of Care;Education  Coordination of Care Other  Education Method Verbal  Support Groups/Services -  Acuity Level 2-Minimal Needs (1-2 Barriers Identified)  Time Spent with Patient 30

## 2019-02-01 ENCOUNTER — Encounter: Payer: Self-pay | Admitting: Internal Medicine

## 2019-02-01 ENCOUNTER — Inpatient Hospital Stay: Payer: Medicare HMO | Attending: Internal Medicine | Admitting: Internal Medicine

## 2019-02-01 VITALS — BP 103/73 | HR 66 | Temp 98.0°F | Resp 18 | Ht 73.0 in | Wt 184.5 lb

## 2019-02-01 DIAGNOSIS — Z7189 Other specified counseling: Secondary | ICD-10-CM | POA: Diagnosis not present

## 2019-02-01 DIAGNOSIS — C342 Malignant neoplasm of middle lobe, bronchus or lung: Secondary | ICD-10-CM | POA: Insufficient documentation

## 2019-02-01 DIAGNOSIS — Z7901 Long term (current) use of anticoagulants: Secondary | ICD-10-CM | POA: Insufficient documentation

## 2019-02-01 DIAGNOSIS — Z5112 Encounter for antineoplastic immunotherapy: Secondary | ICD-10-CM | POA: Insufficient documentation

## 2019-02-01 DIAGNOSIS — I4891 Unspecified atrial fibrillation: Secondary | ICD-10-CM | POA: Diagnosis not present

## 2019-02-01 DIAGNOSIS — C3491 Malignant neoplasm of unspecified part of right bronchus or lung: Secondary | ICD-10-CM | POA: Diagnosis not present

## 2019-02-01 DIAGNOSIS — Z79899 Other long term (current) drug therapy: Secondary | ICD-10-CM | POA: Diagnosis not present

## 2019-02-01 NOTE — Progress Notes (Signed)
DISCONTINUE ON PATHWAY REGIMEN - Non-Small Cell Lung     Administer weekly:     Paclitaxel      Carboplatin   **Always confirm dose/schedule in your pharmacy ordering system**  REASON: Continuation Of Treatment PRIOR TREATMENT: BPJ121: Carboplatin AUC=2 + Paclitaxel 45 mg/m2 Weekly During Radiation TREATMENT RESPONSE: Partial Response (PR)  START ON PATHWAY REGIMEN - Non-Small Cell Lung     A cycle is every 14 days:     Durvalumab   **Always confirm dose/schedule in your pharmacy ordering system**  Patient Characteristics: Stage III - Unresectable, PS = 0, 1 AJCC T Category: T3 Current Disease Status: No Distant Mets or Local Recurrence AJCC N Category: N1 AJCC M Category: M0 AJCC 8 Stage Grouping: IIIA ECOG Performance Status: 1 Intent of Therapy: Curative Intent, Discussed with Patient

## 2019-02-01 NOTE — Patient Instructions (Addendum)
-We covered a lot of important information at your appointment today regarding what the treatment plan is moving forward. Here are the the main points that were discussed at your office visit with [place] today:  -The treatment will consist of a new medication. This is not chemotherapy. This new drug is a type of Immunotherapy called Imfinzi (Durvalumab).  -We are planning on starting your treatment next week on 02/08/2019  -Your treatment will be given once every 2 weeks. You will receive this treatment every two weeks for a total of 1 year (26 total treatments) unless you experience unacceptable toxicity or if there is evidence on your routine CT scans that the cancer is growing  -We will get a CT scan after every 6 treatments to check on the progress of treatment  Side Effects:  -The adverse effect of the immunotherapy including but not limited to immunotherapy mediated skin rash, diarrhea, inflammation of the lung, kidney, liver, thyroid or other endocrine dysfunction  Follow up:  -We will see you back for a follow up visit in 3 weeks  Durvalumab injection What is this medicine? DURVALUMAB (dur VAL ue mab) is a monoclonal antibody. It is used to treat urothelial cancer and lung cancer. This medicine may be used for other purposes; ask your health care provider or pharmacist if you have questions. COMMON BRAND NAME(S): IMFINZI What should I tell my health care provider before I take this medicine? They need to know if you have any of these conditions:  diabetes  immune system problems  infection  inflammatory bowel disease  kidney disease  liver disease  lung or breathing disease  lupus  organ transplant  stomach or intestine problems  thyroid disease  an unusual or allergic reaction to durvalumab, other medicines, foods, dyes, or preservatives  pregnant or trying to get pregnant  breast-feeding How should I use this medicine? This medicine is for infusion into a vein. It  is given by a health care professional in a hospital or clinic setting. A special MedGuide will be given to you before each treatment. Be sure to read this information carefully each time. Talk to your pediatrician regarding the use of this medicine in children. Special care may be needed. Overdosage: If you think you have taken too much of this medicine contact a poison control center or emergency room at once. NOTE: This medicine is only for you. Do not share this medicine with others. What if I miss a dose? It is important not to miss your dose. Call your doctor or health care professional if you are unable to keep an appointment. What may interact with this medicine? Interactions have not been studied. This list may not describe all possible interactions. Give your health care provider a list of all the medicines, herbs, non-prescription drugs, or dietary supplements you use. Also tell them if you smoke, drink alcohol, or use illegal drugs. Some items may interact with your medicine. What should I watch for while using this medicine? This drug may make you feel generally unwell. Continue your course of treatment even though you feel ill unless your doctor tells you to stop. You may need blood work done while you are taking this medicine. Do not become pregnant while taking this medicine or for 3 months after stopping it. Women should inform their doctor if they wish to become pregnant or think they might be pregnant. There is a potential for serious side effects to an unborn child. Talk to your health care professional  or pharmacist for more information. Do not breast-feed an infant while taking this medicine or for 3 months after stopping it. What side effects may I notice from receiving this medicine? Side effects that you should report to your doctor or health care professional as soon as possible:  allergic reactions like skin rash, itching or hives, swelling of the face, lips, or  tongue  black, tarry stools  bloody or watery diarrhea  breathing problems  change in emotions or moods  change in sex drive  changes in vision  chest pain or chest tightness  chills  confusion  cough  facial flushing  fever  headache  signs and symptoms of high blood sugar such as dizziness; dry mouth; dry skin; fruity breath; nausea; stomach pain; increased hunger or thirst; increased urination  signs and symptoms of liver injury like dark yellow or brown urine; general ill feeling or flu-like symptoms; light-colored stools; loss of appetite; nausea; right upper belly pain; unusually weak or tired; yellowing of the eyes or skin  stomach pain  trouble passing urine or change in the amount of urine  weight gain or weight loss Side effects that usually do not require medical attention (report these to your doctor or health care professional if they continue or are bothersome):  bone pain  constipation  loss of appetite  muscle pain  nausea  swelling of the ankles, feet, hands  tiredness This list may not describe all possible side effects. Call your doctor for medical advice about side effects. You may report side effects to FDA at 1-800-FDA-1088. Where should I keep my medicine? This drug is given in a hospital or clinic and will not be stored at home. NOTE: This sheet is a summary. It may not cover all possible information. If you have questions about this medicine, talk to your doctor, pharmacist, or health care provider.  2020 Elsevier/Gold Standard (2016-06-25 19:25:04)

## 2019-02-01 NOTE — Progress Notes (Signed)
[place] [phone]   [phone]  OFFICE PROGRESS NOTE  [name], MD [place] [address]  DIAGNOSIS: Stage IIIA (T3, N1, M0) non-small cell lung cancer, squamous cell carcinoma diagnosed in July 2020 and presented with right middle lobe mass and right hilar adenopathy with postobstructive pneumonia and suspicious right pleural effusion.  PRIOR THERAPY:  Weekly concurrent chemoradiation with Carboplatin for an AUC of 2 and paclitaxel 45 mg/m2. First dose 11/23/2018. Status post 7 cycles.   CURRENT THERAPY: Consolidation immunotherapy with Imfinzi 10 mg/KG every 2 weeks.  First dose February 08, 2019.  INTERVAL HISTORY: Joe Cole 68 y.o. male returns to the clinic today for follow-up visit.  [name] was available by phone during the visit.  The patient is feeling fine today with no concerning complaints.  He denied having any chest pain, shortness of breath except with exertion with mild cough but no hemoptysis.  He denied having any fever or chills.  He has no nausea, vomiting, diarrhea or constipation.  He denied having any headache or visual changes.  He has no significant weight loss or night sweats.  He tolerated the previous course of concurrent chemoradiation fairly well.  The patient had repeat CT scan of the chest performed recently and he is here for evaluation and discussion of his scan results.  MEDICAL HISTORY: Past Medical History:  Diagnosis Date   Arthritis    Atrial fibrillation (Joe Cole) 10/2018   COPD with emphysema ([place])    Dyspnea    Hypertension    lung ca dx'd 10/2018    ALLERGIES:  has No Known Allergies.  MEDICATIONS:  Current Outpatient Medications  Medication Sig Dispense Refill   acetaminophen (TYLENOL) 500 MG tablet Take 1,000 mg by mouth every 6 (six) hours as needed for moderate pain or headache.     albuterol (PROAIR HFA) 108 (90 Base) MCG/ACT inhaler  Inhale 2 puffs into the lungs every 6 (six) hours as needed for wheezing or shortness of breath. 1 Inhaler 5   apixaban (ELIQUIS) 5 MG TABS tablet Take 1 tablet (5 mg total) by mouth 2 (two) times daily. 60 tablet 3   atorvastatin (LIPITOR) 20 MG tablet Take 20 mg by mouth daily.     diltiazem (CARDIZEM CD) 360 MG 24 hr capsule Take 1 capsule (360 mg total) by mouth daily. 30 capsule 3   metoprolol succinate (TOPROL XL) 25 MG 24 hr tablet Take 1 tablet (25 mg total) by mouth at bedtime. 30 tablet 11   metoprolol succinate (TOPROL XL) 50 MG 24 hr tablet Take 1 tablet (50 mg total) by mouth at bedtime. Take with or immediately following a meal. 30 tablet 11   Probiotic Product (ALIGN) 4 MG CAPS Take 4 mg by mouth daily.     prochlorperazine (COMPAZINE) 10 MG tablet Take 1 tablet (10 mg total) by mouth every 6 (six) hours as needed for nausea or vomiting. 30 tablet 0   traZODone (DESYREL) 50 MG tablet TAKE 1 TABLET BY MOUTH EVERYDAY AT BEDTIME (Patient taking differently: Take 50 mg by mouth at bedtime as needed for sleep. ) 90 tablet 1   umeclidinium-vilanterol (ANORO ELLIPTA) 62.5-25 MCG/INH AEPB Inhale 1 puff into the lungs daily. 1 each 6   No current facility-administered medications for this visit.     SURGICAL HISTORY:  Past Surgical History:  Procedure Laterality Date   CARDIOVERSION N/A 01/11/2019   Procedure: CARDIOVERSION;  Surgeon: Josue Hector, MD;  Location: [facility] ENDOSCOPY;  Service: Cardiovascular;  Laterality: N/A;   KNEE ARTHROSCOPY     BIL    TOE SURGERY     PINNED LEFT BIG TOE   TONSILLECTOMY     T+A  AS CHILD   TOTAL KNEE ARTHROPLASTY Left 08/19/2017   Procedure: TOTAL KNEE ARTHROPLASTY;  Surgeon: Renette Butters, MD;  Location: [facility];  Service: Orthopedics;  Laterality: Left;   VIDEO BRONCHOSCOPY Bilateral 11/05/2018   Procedure: VIDEO BRONCHOSCOPY WITH FLUORO;  Surgeon: [name], MD;  Location: [facility];  Service: Cardiopulmonary;  Laterality:  Bilateral;   VIDEO BRONCHOSCOPY Bilateral 11/09/2018   Procedure: VIDEO BRONCHOSCOPY WITH FLUORO;  Surgeon: Rigoberto Noel, MD;  Location: [facility];  Service: Cardiopulmonary;  Laterality: Bilateral;    REVIEW OF SYSTEMS:  Constitutional: positive for fatigue Eyes: negative Ears, nose, mouth, throat, and face: negative Respiratory: positive for dyspnea on exertion Cardiovascular: negative Gastrointestinal: negative Genitourinary:negative Integument/breast: negative Hematologic/lymphatic: negative Musculoskeletal:negative Neurological: negative Behavioral/Psych: negative Endocrine: negative Allergic/Immunologic: negative   PHYSICAL EXAMINATION: General appearance: alert, cooperative and no distress Head: Normocephalic, without obvious abnormality, atraumatic Neck: no adenopathy, no JVD, supple, symmetrical, trachea midline and thyroid not enlarged, symmetric, no tenderness/mass/nodules Lymph nodes: Cervical, supraclavicular, and axillary nodes normal. Resp: clear to auscultation bilaterally Back: symmetric, no curvature. ROM normal. No CVA tenderness. Cardio: regular rate and rhythm, S1, S2 normal, no murmur, click, rub or gallop GI: soft, non-tender; bowel sounds normal; no masses,  no organomegaly Extremities: extremities normal, atraumatic, no cyanosis or edema Neurologic: Alert and oriented X 3, normal strength and tone. Normal symmetric reflexes. Normal coordination and gait  ECOG PERFORMANCE STATUS: 1 - Symptomatic but completely ambulatory  Blood pressure 103/73, pulse 66, temperature 98 F (36.7 C), temperature source Temporal, resp. rate 18, height 6\' 1"  (1.854 m), weight 184 lb 8 oz (83.7 kg), SpO2 100 %.  LABORATORY DATA: Lab Results  Component Value Date   WBC 5.1 01/26/2019   HGB 11.8 (L) 01/26/2019   HCT 34.4 (L) 01/26/2019   MCV 113.5 (H) 01/26/2019   PLT 322 01/26/2019      Chemistry      Component Value Date/Time   NA 137 01/26/2019 1051   K 4.5  01/26/2019 1051   CL 103 01/26/2019 1051   CO2 24 01/26/2019 1051   BUN 9 01/26/2019 1051   CREATININE 0.82 01/26/2019 1051      Component Value Date/Time   CALCIUM 9.5 01/26/2019 1051   ALKPHOS 79 01/26/2019 1051   AST 20 01/26/2019 1051   ALT 20 01/26/2019 1051   BILITOT 0.6 01/26/2019 1051       RADIOGRAPHIC STUDIES: Ct Chest W Contrast  Result Date: 01/26/2019 CLINICAL DATA:  Stage IIIA squamous cell right lung cancer diagnosed July 2020 with interval concurrent chemo radiation therapy. Restaging. EXAM: CT CHEST WITH CONTRAST TECHNIQUE: Multidetector CT imaging of the chest was performed during intravenous contrast administration. CONTRAST:  33mL OMNIPAQUE IOHEXOL 300 MG/ML  SOLN COMPARISON:  10/20/2018 chest CT angiogram.  10/29/2018 PET-CT. FINDINGS: Cardiovascular: Normal heart size. No significant pericardial effusion/thickening. Left anterior descending coronary atherosclerosis. Atherosclerotic nonaneurysmal thoracic aorta. Normal caliber pulmonary arteries. No central pulmonary emboli. Mediastinum/Nodes: No discrete thyroid nodules. Unremarkable esophagus. No axillary adenopathy. Previously visualized mildly enlarged 1.1 cm right paratracheal node on 10/20/2018 chest CT angiogram study is decreased to 0.7 cm (series 2/image 69). No pathologically enlarged mediastinal nodes. No left hilar adenopathy. Lungs/Pleura: No pneumothorax. Small dependent right pleural effusion  slightly increased. No left pleural effusion. Poorly marginated 2.7 x 1.7 cm right infrahilar pulmonary nodule (series 2/image 104), decreased from 4.6 x 3.1 cm on 10/20/2018 chest CT using similar measurement technique. Persistent but improved narrowing of the right middle lobe bronchus. Right lower lobe bronchus is now patent. Near complete consolidation of the right middle lobe is similar, with worsened volume loss in the right middle lobe. Residual thin parenchymal bands in the basilar right lower lobe. No acute  consolidative airspace disease or new significant pulmonary nodules. Stable tiny calcified right upper lobe granuloma. Mild centrilobular emphysema. Upper abdomen: Stable mild thickening of both adrenal glands without discrete adrenal nodules. Musculoskeletal: No aggressive appearing focal osseous lesions. Marked thoracic spondylosis. IMPRESSION: 1. Interval response to therapy. Poorly marginated right infrahilar mass is decreased in size. Persistent but improved narrowing of the right middle lobe bronchus. Right lower lobe bronchus is now patent. 2. No new or progressive metastatic disease in the chest. Right paratracheal adenopathy has decreased. 3. Small dependent right pleural effusion is slightly increased. 4. Persistent near complete consolidation of the right middle lobe with worsening right middle lobe volume loss, compatible with evolving postobstructive scarring/atelectasis. Aortic Atherosclerosis (ICD10-I70.0) and Emphysema (ICD10-J43.9). Electronically Signed   By: Ilona Sorrel M.D.   On: 01/26/2019 13:34    ASSESSMENT AND PLAN: This is a very pleasant 68 years old white male with stage IIIA non-small cell lung cancer, squamous cell carcinoma.  The patient underwent a course of concurrent chemoradiation with weekly carboplatin and paclitaxel status post 7 cycles.  The patient tolerated his treatment well except for mild fatigue and odynophagia. He had repeat CT scan of the chest performed recently.  I personally and independently reviewed the scans and discussed the results with the patient and his wife today. His scan showed improvement of his disease with decrease and the size of the right infrahilar mass as well as the mediastinal lymphadenopathy. I discussed with the patient his treatment options including continuous observation versus treatment with consolidation immunotherapy with Imfinzi 10 mg/KG every 2 weeks for a total of 1 year unless the patient has unacceptable toxicity or disease  progression. I discussed with him and his wife the adverse effect of this treatment including but not limited to immunotherapy mediated skin rash, diarrhea, inflammation of the lung, kidney, liver, thyroid or other endocrine dysfunction. He is expected to start the first cycle of this treatment next week. I will see him back for follow-up visit in 3 weeks for evaluation with the start of cycle #2. For the atrial fibrillation, he will continue with Eliquis. The patient was advised to call immediately if he has any concerning symptoms in the interval.  The patient voices understanding of current disease status and treatment options and is in agreement with the current care plan. All questions were answered. The patient knows to call the clinic with any problems, questions or concerns. We can certainly see the patient much sooner if necessary.  Disclaimer: This note was dictated with voice recognition software. Similar sounding words can inadvertently be transcribed and may not be corrected upon review.

## 2019-02-02 ENCOUNTER — Telehealth: Payer: Self-pay | Admitting: Internal Medicine

## 2019-02-02 NOTE — Telephone Encounter (Signed)
Scheduled appt per 10/5 los. Pt wife aware of appt date and time

## 2019-02-04 ENCOUNTER — Encounter: Payer: Self-pay | Admitting: Urology

## 2019-02-04 ENCOUNTER — Ambulatory Visit
Admission: RE | Admit: 2019-02-04 | Discharge: 2019-02-04 | Disposition: A | Discharge status: Home / Self Care | Payer: Medicare HMO | Source: Ambulatory Visit | Attending: Urology | Admitting: Urology

## 2019-02-04 ENCOUNTER — Other Ambulatory Visit: Payer: Self-pay

## 2019-02-04 DIAGNOSIS — C3431 Malignant neoplasm of lower lobe, right bronchus or lung: Secondary | ICD-10-CM

## 2019-02-04 NOTE — Progress Notes (Signed)
Radiation Oncology         [phone] ________________________________  Name: Joe Cole MRN: [id number]  Date: 02/04/2019  DOB: 1951-02-25  Post Treatment Note  CC: [name], MD  [name], MD  Diagnosis:   68 yo man with NSCLC of the right lower lung     Interval Since Last Radiation:  4 weeks  11/19/18-01/05/19:  The primary tumor and involved mediastinal adenopathy were treated to 66 Gy in 33 fractions of 2 Gy; concurrent with chemotherapy.   Narrative:  I spoke with the patient to conduct his routine scheduled 1 month follow up visit via telephone to spare the patient unnecessary potential exposure in the healthcare setting during the current COVID-19 pandemic.  The patient was notified in advance and gave permission to proceed with this visit format. He tolerated radiation treatment relatively well.  He experienced some esophagitis characterized as mild and also noted fatigue.  He denied any difficulty with swallowing and reported a good appetite.  He denied any coughing or issues with his skin.                             On review of systems, the patient states that he is doing well overall.  He denies hemoptysis, chest pain, productive cough or increased shortness of breath.  He reports that he has had some recent issues related to his atrial fibrillation and is scheduled to follow-up with his cardiologist next week.  Otherwise, he is continued to feel well and reports that his energy is gradually improving.  He has not had any recent fever, chills, night sweats or unintentional weight loss.  Overall, he is quite pleased with his progress to date.  He had a recent follow-up chest CT on 01/26/2019 which indicated a good response to treatment with the right infrahilar mass decreased in size with improvement in the narrowing of the right middle lobe bronchus and right lower lobe bronchus now patent.  There was no evidence of progressive metastatic disease in the chest and  the right paratracheal adenopathy was noted to be improved.  He had a follow-up with Dr. Earlie Server on 02/01/2019 and the decision is to proceed with consolidative immunotherapy with Imfinzi which is scheduled to start next week.   ALLERGIES:  has No Known Allergies.  Meds: Current Outpatient Medications  Medication Sig Dispense Refill   acetaminophen (TYLENOL) 500 MG tablet Take 1,000 mg by mouth every 6 (six) hours as needed for moderate pain or headache.     albuterol (PROAIR HFA) 108 (90 Base) MCG/ACT inhaler Inhale 2 puffs into the lungs every 6 (six) hours as needed for wheezing or shortness of breath. 1 Inhaler 5   apixaban (ELIQUIS) 5 MG TABS tablet Take 1 tablet (5 mg total) by mouth 2 (two) times daily. 60 tablet 3   atorvastatin (LIPITOR) 20 MG tablet Take 20 mg by mouth daily.     diltiazem (CARDIZEM CD) 360 MG 24 hr capsule Take 1 capsule (360 mg total) by mouth daily. 30 capsule 3   metoprolol succinate (TOPROL XL) 25 MG 24 hr tablet Take 1 tablet (25 mg total) by mouth at bedtime. 30 tablet 11   metoprolol succinate (TOPROL XL) 50 MG 24 hr tablet Take 1 tablet (50 mg total) by mouth at bedtime. Take with or immediately following a meal. 30 tablet 11   Probiotic Product (ALIGN) 4 MG CAPS Take 4 mg by mouth daily.  prochlorperazine (COMPAZINE) 10 MG tablet Take 1 tablet (10 mg total) by mouth every 6 (six) hours as needed for nausea or vomiting. 30 tablet 0   traZODone (DESYREL) 50 MG tablet TAKE 1 TABLET BY MOUTH EVERYDAY AT BEDTIME (Patient taking differently: Take 50 mg by mouth at bedtime as needed for sleep. ) 90 tablet 1   umeclidinium-vilanterol (ANORO ELLIPTA) 62.5-25 MCG/INH AEPB Inhale 1 puff into the lungs daily. 1 each 6   No current facility-administered medications for this encounter.     Physical Findings:  vitals were not taken for this visit.   /Unable to assess due to telephone follow-up visit format.  Lab Findings: Lab Results  Component Value  Date   WBC 5.1 01/26/2019   HGB 11.8 (L) 01/26/2019   HCT 34.4 (L) 01/26/2019   MCV 113.5 (H) 01/26/2019   PLT 322 01/26/2019     Radiographic Findings: Ct Chest W Contrast  Result Date: 01/26/2019 CLINICAL DATA:  Stage IIIA squamous cell right lung cancer diagnosed July 2020 with interval concurrent chemo radiation therapy. Restaging. EXAM: CT CHEST WITH CONTRAST TECHNIQUE: Multidetector CT imaging of the chest was performed during intravenous contrast administration. CONTRAST:  5mL OMNIPAQUE IOHEXOL 300 MG/ML  SOLN COMPARISON:  10/20/2018 chest CT angiogram.  10/29/2018 PET-CT. FINDINGS: Cardiovascular: Normal heart size. No significant pericardial effusion/thickening. Left anterior descending coronary atherosclerosis. Atherosclerotic nonaneurysmal thoracic aorta. Normal caliber pulmonary arteries. No central pulmonary emboli. Mediastinum/Nodes: No discrete thyroid nodules. Unremarkable esophagus. No axillary adenopathy. Previously visualized mildly enlarged 1.1 cm right paratracheal node on 10/20/2018 chest CT angiogram study is decreased to 0.7 cm (series 2/image 69). No pathologically enlarged mediastinal nodes. No left hilar adenopathy. Lungs/Pleura: No pneumothorax. Small dependent right pleural effusion slightly increased. No left pleural effusion. Poorly marginated 2.7 x 1.7 cm right infrahilar pulmonary nodule (series 2/image 104), decreased from 4.6 x 3.1 cm on 10/20/2018 chest CT using similar measurement technique. Persistent but improved narrowing of the right middle lobe bronchus. Right lower lobe bronchus is now patent. Near complete consolidation of the right middle lobe is similar, with worsened volume loss in the right middle lobe. Residual thin parenchymal bands in the basilar right lower lobe. No acute consolidative airspace disease or new significant pulmonary nodules. Stable tiny calcified right upper lobe granuloma. Mild centrilobular emphysema. Upper abdomen: Stable mild  thickening of both adrenal glands without discrete adrenal nodules. Musculoskeletal: No aggressive appearing focal osseous lesions. Marked thoracic spondylosis. IMPRESSION: 1. Interval response to therapy. Poorly marginated right infrahilar mass is decreased in size. Persistent but improved narrowing of the right middle lobe bronchus. Right lower lobe bronchus is now patent. 2. No new or progressive metastatic disease in the chest. Right paratracheal adenopathy has decreased. 3. Small dependent right pleural effusion is slightly increased. 4. Persistent near complete consolidation of the right middle lobe with worsening right middle lobe volume loss, compatible with evolving postobstructive scarring/atelectasis. Aortic Atherosclerosis (ICD10-I70.0) and Emphysema (ICD10-J43.9). Electronically Signed   By: Ilona Sorrel M.D.   On: 01/26/2019 13:34    Impression/Plan: 76. 68 yo man with NSCLC of the right lower lung. He appears to have recovered well from the effects of his recent chemoradiation.  His most recent CT chest imaging demonstrates a good response to treatment and he is planning to move forward with consolidative immunotherapy with Imfinzi under the care and direction of Dr. Earlie Server.  We discussed that while we are happy to continue to participate in his care if clinically indicated, at this point, we will  plan to see him back on an as-needed basis.  He knows to call at anytime with any questions or concerns related to his previous radiation.       Nicholos Johns, PA-C

## 2019-02-08 ENCOUNTER — Inpatient Hospital Stay: Payer: Medicare HMO

## 2019-02-08 ENCOUNTER — Other Ambulatory Visit: Payer: Self-pay | Admitting: Pharmacist

## 2019-02-08 ENCOUNTER — Ambulatory Visit: Payer: Self-pay | Admitting: Pharmacist

## 2019-02-08 ENCOUNTER — Other Ambulatory Visit: Payer: Self-pay

## 2019-02-08 VITALS — BP 124/79 | HR 86 | Temp 97.8°F | Resp 18

## 2019-02-08 DIAGNOSIS — C3491 Malignant neoplasm of unspecified part of right bronchus or lung: Secondary | ICD-10-CM

## 2019-02-08 DIAGNOSIS — C342 Malignant neoplasm of middle lobe, bronchus or lung: Secondary | ICD-10-CM | POA: Diagnosis not present

## 2019-02-08 DIAGNOSIS — Z7901 Long term (current) use of anticoagulants: Secondary | ICD-10-CM | POA: Diagnosis not present

## 2019-02-08 DIAGNOSIS — Z79899 Other long term (current) drug therapy: Secondary | ICD-10-CM | POA: Diagnosis not present

## 2019-02-08 DIAGNOSIS — C3431 Malignant neoplasm of lower lobe, right bronchus or lung: Secondary | ICD-10-CM

## 2019-02-08 DIAGNOSIS — I4891 Unspecified atrial fibrillation: Secondary | ICD-10-CM | POA: Diagnosis not present

## 2019-02-08 DIAGNOSIS — Z5112 Encounter for antineoplastic immunotherapy: Secondary | ICD-10-CM | POA: Diagnosis not present

## 2019-02-08 LAB — CMP (CANCER CENTER ONLY)
ALT: 18 U/L (ref 0–44)
AST: 23 U/L (ref 15–41)
Albumin: 3.6 g/dL (ref 3.5–5.0)
Alkaline Phosphatase: 76 U/L (ref 38–126)
Anion gap: 13 (ref 5–15)
BUN: 8 mg/dL (ref 8–23)
CO2: 22 mmol/L (ref 22–32)
Calcium: 9.5 mg/dL (ref 8.9–10.3)
Chloride: 103 mmol/L (ref 98–111)
Creatinine: 0.8 mg/dL (ref 0.61–1.24)
GFR, Est AFR Am: >60 mL/min (ref >60)
GFR, Estimated: >60 mL/min (ref >60)
Glucose, Bld: 92 mg/dL (ref 70–99)
Potassium: 5 mmol/L (ref 3.5–5.1)
Sodium: 138 mmol/L (ref 135–145)
Total Bilirubin: 0.9 mg/dL (ref 0.3–1.2)
Total Protein: 6.7 g/dL (ref 6.5–8.1)

## 2019-02-08 LAB — CBC WITH DIFFERENTIAL (CANCER CENTER ONLY)
Abs Immature Granulocytes: 0.02 10*3/uL (ref 0.00–0.07)
Basophils Absolute: 0.1 10*3/uL (ref 0.0–0.1)
Basophils Relative: 2 %
Eosinophils Absolute: 0.4 10*3/uL (ref 0.0–0.5)
Eosinophils Relative: 7 %
HCT: 37.9 % — ABNORMAL LOW (ref 39.0–52.0)
Hemoglobin: 13.3 g/dL (ref 13.0–17.0)
Immature Granulocytes: 0 %
Lymphocytes Relative: 22 %
Lymphs Abs: 1.2 10*3/uL (ref 0.7–4.0)
MCH: 39.7 pg — ABNORMAL HIGH (ref 26.0–34.0)
MCHC: 35.1 g/dL (ref 30.0–36.0)
MCV: 113.1 fL — ABNORMAL HIGH (ref 80.0–100.0)
Monocytes Absolute: 0.7 10*3/uL (ref 0.1–1.0)
Monocytes Relative: 14 %
Neutro Abs: 3 10*3/uL (ref 1.7–7.7)
Neutrophils Relative %: 55 %
Platelet Count: 267 10*3/uL (ref 150–400)
RBC: 3.35 MIL/uL — ABNORMAL LOW (ref 4.22–5.81)
RDW: 18.1 % — ABNORMAL HIGH (ref 11.5–15.5)
WBC Count: 5.4 10*3/uL (ref 4.0–10.5)
nRBC: 0.4 % — ABNORMAL HIGH (ref 0.0–0.2)

## 2019-02-08 LAB — TSH: TSH: 2.356 u[IU]/mL (ref 0.320–4.118)

## 2019-02-08 MED ORDER — SODIUM CHLORIDE 0.9 % IV SOLN: 10.0000 mg/kg | Freq: Once | INTRAVENOUS | Status: DC

## 2019-02-08 MED ORDER — SODIUM CHLORIDE 0.9 % IV SOLN
860.0000 mg | Freq: Once | INTRAVENOUS | Status: AC
  Administered 2019-02-08: 860 mg via INTRAVENOUS
  Filled 2019-02-08: qty 7.2

## 2019-02-08 MED ORDER — SODIUM CHLORIDE 0.9 % IV SOLN
Freq: Once | INTRAVENOUS | Status: AC
  Administered 2019-02-08: 09:00:00 via INTRAVENOUS
  Filled 2019-02-08: qty 250

## 2019-02-08 NOTE — Patient Outreach (Signed)
[facility]) Care Management  02/08/2019  Joe Cole 08-Jun-1950 [id number]   Patient was called to talk about his TROOP to see if he would be eligible for Eliquis Patient Assistance. Unfortunately, he did not answer the phone. HIPAA compliant message was left on the mobile phone number.    Plan: Send unsuccessful outreach letter. Call patient back in 1-2 weeks.  Elayne Guerin, PharmD, [facility] Clinical Pharmacist [phone]

## 2019-02-08 NOTE — Patient Instructions (Addendum)
[facility] Discharge Instructions for Patients Receiving Chemotherapy  Today you received the following chemotherapy agents: Imfinzi.  To help prevent nausea and vomiting after your treatment, we encourage you to take your nausea medication as directed.   If you develop nausea and vomiting that is not controlled by your nausea medication, call the clinic.   BELOW ARE SYMPTOMS THAT SHOULD BE REPORTED IMMEDIATELY:  *FEVER GREATER THAN 100.5 F  *CHILLS WITH OR WITHOUT FEVER  NAUSEA AND VOMITING THAT IS NOT CONTROLLED WITH YOUR NAUSEA MEDICATION  *UNUSUAL SHORTNESS OF BREATH  *UNUSUAL BRUISING OR BLEEDING  TENDERNESS IN MOUTH AND THROAT WITH OR WITHOUT PRESENCE OF ULCERS  *URINARY PROBLEMS  *BOWEL PROBLEMS  UNUSUAL RASH Items with * indicate a potential emergency and should be followed up as soon as possible.  Feel free to call the clinic should you have any questions or concerns. The clinic phone number is [phone].  Please show the CHEMO ALERT CARD at check-in to the Emergency Department and triage nurse.  Durvalumab injection What is this medicine? DURVALUMAB (dur VAL ue mab) is a monoclonal antibody. It is used to treat urothelial cancer and lung cancer. This medicine may be used for other purposes; ask your health care provider or pharmacist if you have questions. COMMON BRAND NAME(S): IMFINZI What should I tell my health care provider before I take this medicine? They need to know if you have any of these conditions:  diabetes  immune system problems  infection  inflammatory bowel disease  kidney disease  liver disease  lung or breathing disease  lupus  organ transplant  stomach or intestine problems  thyroid disease  an unusual or allergic reaction to durvalumab, other medicines, foods, dyes, or preservatives  pregnant or trying to get pregnant  breast-feeding How should I use this medicine? This medicine is for infusion  into a vein. It is given by a health care professional in a hospital or clinic setting. A special MedGuide will be given to you before each treatment. Be sure to read this information carefully each time. Talk to your pediatrician regarding the use of this medicine in children. Special care may be needed. Overdosage: If you think you have taken too much of this medicine contact a poison control center or emergency room at once. NOTE: This medicine is only for you. Do not share this medicine with others. What if I miss a dose? It is important not to miss your dose. Call your doctor or health care professional if you are unable to keep an appointment. What may interact with this medicine? Interactions have not been studied. This list may not describe all possible interactions. Give your health care provider a list of all the medicines, herbs, non-prescription drugs, or dietary supplements you use. Also tell them if you smoke, drink alcohol, or use illegal drugs. Some items may interact with your medicine. What should I watch for while using this medicine? This drug may make you feel generally unwell. Continue your course of treatment even though you feel ill unless your doctor tells you to stop. You may need blood work done while you are taking this medicine. Do not become pregnant while taking this medicine or for 3 months after stopping it. Women should inform their doctor if they wish to become pregnant or think they might be pregnant. There is a potential for serious side effects to an unborn child. Talk to your health care professional or pharmacist for more information. Do not breast-feed   an infant while taking this medicine or for 3 months after stopping it. What side effects may I notice from receiving this medicine? Side effects that you should report to your doctor or health care professional as soon as possible:  allergic reactions like skin rash, itching or hives, swelling of the face,  lips, or tongue  black, tarry stools  bloody or watery diarrhea  breathing problems  change in emotions or moods  change in sex drive  changes in vision  chest pain or chest tightness  chills  confusion  cough  facial flushing  fever  headache  signs and symptoms of high blood sugar such as dizziness; dry mouth; dry skin; fruity breath; nausea; stomach pain; increased hunger or thirst; increased urination  signs and symptoms of liver injury like dark yellow or brown urine; general ill feeling or flu-like symptoms; light-colored stools; loss of appetite; nausea; right upper belly pain; unusually weak or tired; yellowing of the eyes or skin  stomach pain  trouble passing urine or change in the amount of urine  weight gain or weight loss Side effects that usually do not require medical attention (report these to your doctor or health care professional if they continue or are bothersome):  bone pain  constipation  loss of appetite  muscle pain  nausea  swelling of the ankles, feet, hands  tiredness This list may not describe all possible side effects. Call your doctor for medical advice about side effects. You may report side effects to FDA at 1-800-FDA-1088. Where should I keep my medicine? This drug is given in a hospital or clinic and will not be stored at home. NOTE: This sheet is a summary. It may not cover all possible information. If you have questions about this medicine, talk to your doctor, pharmacist, or health care provider.  2020 Elsevier/Gold Standard (2016-06-25 19:25:04)    

## 2019-02-09 ENCOUNTER — Telehealth: Payer: Self-pay | Admitting: *Deleted

## 2019-02-09 NOTE — Telephone Encounter (Signed)
Called pt & spoke with wife & she states that he did well with treatment yesterday.  She mentioned that he had some itching & redness of some knuckles of his hand after returning home yest & they used some cortisone cream & it went away.  She denies any other symptoms. She reports that they know side effects & reasons to call.

## 2019-02-09 NOTE — Telephone Encounter (Signed)
-----   Message from Wylene Men, RN sent at 02/08/2019  3:50 PM EDT ----- Regarding: [place] Patient received first time Imfinzi. No s/s of reaction or distress. Patient tolerated treatment well. Thank you.

## 2019-02-17 ENCOUNTER — Other Ambulatory Visit: Payer: Self-pay

## 2019-02-17 ENCOUNTER — Encounter: Payer: Self-pay | Admitting: Pulmonary Disease

## 2019-02-17 ENCOUNTER — Ambulatory Visit: Payer: Medicare HMO | Admitting: Pulmonary Disease

## 2019-02-17 VITALS — BP 118/70 | HR 77 | Temp 97.6°F | Ht 72.0 in | Wt 181.6 lb

## 2019-02-17 DIAGNOSIS — J449 Chronic obstructive pulmonary disease, unspecified: Secondary | ICD-10-CM

## 2019-02-17 DIAGNOSIS — I4819 Other persistent atrial fibrillation: Secondary | ICD-10-CM | POA: Diagnosis not present

## 2019-02-17 DIAGNOSIS — C3491 Malignant neoplasm of unspecified part of right bronchus or lung: Secondary | ICD-10-CM | POA: Diagnosis not present

## 2019-02-17 DIAGNOSIS — Z23 Encounter for immunization: Secondary | ICD-10-CM

## 2019-02-17 NOTE — Progress Notes (Signed)
[facility]  Chief Complaint  Patient presents with  . Stage III squamous cell carcinoma of right lung ([place])    Seen by Cardiology on 02/01/19. Has not needed to use inhaler. Used Anoro this morning.    Constitutional:  BP 118/70 (BP Location: Left Arm, Patient Position: Sitting, Cuff Size: Normal)   Pulse 77   Temp 97.6 F (36.4 C)   Ht 6' (1.829 m)   Wt 181 lb 9.6 oz (82.4 kg)   SpO2 97% Comment: on room air  BMI 24.63 kg/m   Past Medical History:  Arthritis, Hypertension, NSCLC cancer dx July 2020  Brief Summary:  Joe Cole is a 68 y.o. male former smoker with COPD/emphysema.    Since his last visit he had chemo with oncology and started on immunotherapy.  Also had cardioversion with cardiology but then went back into A fib.    He feels fatigued.  Has to use bathroom frequently at night.  Wife reports he does snore some.  He isn't having as much cough.  Not having chest pain, wheeze, or sputum.  He was uncertain about the role of anoro and hasn't been using on regular basis.  Not having fever, sweats, hemoptysis, or leg swelling.  Physical Exam:   Appearance - well kempt   ENMT - no sinus tenderness, no nasal discharge, no oral exudate  Neck - no masses, trachea midline, no thyromegaly, no elevation in JVP  Respiratory - normal appearance of chest wall, normal respiratory effort w/o accessory muscle use, no dullness on percussion, no wheezing or rales  CV - irregular rate and rhythm, no murmurs, no peripheral edema, radial pulses symmetric  GI - soft, non tender  Lymph - no adenopathy noted in neck and axillary areas  MSK - normal gait  Ext - no cyanosis, clubbing, or joint inflammation noted  Skin - no rashes, lesions, or ulcers  Neuro - normal strength, oriented x 3  Psych - normal mood and affect    Assessment/Plan:   COPD with emphysema. - advised him to use anoro on regular basis - prn albuterol -  had detailed discussion about roles for different inhalers - high dose flu shot today  Post nasal drip. - prn flonase  Stage 3 NSCLC (squamous cell). - s/p chemotherapy with carboplatin, paclitaxel - started consolidation immunotherapy with imfinzi 02/08/19 - f/u with Dr. Julien Nordmann  Persistent A fib. - f/u with Dr. Marlou Porch  Snoring. - discussed dx of sleep apnea and how this can impact his health - he will monitor his sleep pattern and then determine if he wants to pursue a home sleep study   Patient Instructions  Monitor your breathing pattern while asleep and call if you want to get a sleep study set up  Use anoro on a daily basis  Follow up in 6 months    [name], MD [place] Pager: [phone] 02/17/2019, 9:34 AM  Flow Sheet     Pulmonary tests:   PFT 05/04/18 >> FEV1 2.35 (64%), FEV1% 59, TLC 9.00 (121%), RV 4.89 (197%), DLCO 60% A1AT level 05/04/18 >> 167, MM  Chest imaging:  CT angio chest 10/20/18 >> Rt hilar LAN 2.2 cm, centrilobular and paraseptal emphysema, mass Rt perihilar region PET scan 9/62/95 >> hypermetabolic Rt hila with postobstructive collapse of RML, nodule RML, small Rt effusion  Cardiac tests:  Echo 11/06/18 >> EF 50 to 55%, severe LA/RA dilation   Medications:   Allergies as of  02/17/2019   No Known Allergies     Medication List       Accurate as of February 17, 2019  9:34 AM. If you have any questions, ask your nurse or doctor.        acetaminophen 500 MG tablet Commonly known as: TYLENOL Take 1,000 mg by mouth every 6 (six) hours as needed for moderate pain or headache.   albuterol 108 (90 Base) MCG/ACT inhaler Commonly known as: ProAir HFA Inhale 2 puffs into the lungs every 6 (six) hours as needed for wheezing or shortness of breath.   Align 4 MG Caps Take 4 mg by mouth daily.   Anoro Ellipta 62.5-25 MCG/INH Aepb Generic drug: umeclidinium-vilanterol Inhale 1 puff into the lungs daily.    apixaban 5 MG Tabs tablet Commonly known as: ELIQUIS Take 1 tablet (5 mg total) by mouth 2 (two) times daily.   atorvastatin 20 MG tablet Commonly known as: LIPITOR Take 20 mg by mouth daily.   diltiazem 360 MG 24 hr capsule Commonly known as: CARDIZEM CD Take 1 capsule (360 mg total) by mouth daily.   metoprolol succinate 50 MG 24 hr tablet Commonly known as: Toprol XL Take 1 tablet (50 mg total) by mouth at bedtime. Take with or immediately following a meal.   metoprolol succinate 25 MG 24 hr tablet Commonly known as: Toprol XL Take 1 tablet (25 mg total) by mouth at bedtime.   prochlorperazine 10 MG tablet Commonly known as: COMPAZINE Take 1 tablet (10 mg total) by mouth every 6 (six) hours as needed for nausea or vomiting.   traZODone 50 MG tablet Commonly known as: DESYREL TAKE 1 TABLET BY MOUTH EVERYDAY AT BEDTIME What changed: See the new instructions.       Past Surgical History:  He  has a past surgical history that includes Knee arthroscopy; Toe Surgery; Tonsillectomy; Total knee arthroplasty (Left, 08/19/2017); Video bronchoscopy (Bilateral, 11/05/2018); Video bronchoscopy (Bilateral, 11/09/2018); and Cardioversion (N/A, 01/11/2019).  Family History:  His family history includes Atrial fibrillation in his mother; Emphysema in his father; Pneumonia in his father.  Social History:  He  reports that he quit smoking about 5 months ago. His smoking use included cigarettes. He has a 69.00 pack-year smoking history. He has never used smokeless tobacco. He reports current alcohol use of about 19.0 standard drinks of alcohol per week. He reports that he does not use drugs.

## 2019-02-17 NOTE — Patient Instructions (Signed)
Monitor your breathing pattern while asleep and call if you want to get a sleep study set up  Use anoro on a daily basis  Follow up in 6 months

## 2019-02-19 ENCOUNTER — Telehealth: Payer: Self-pay | Admitting: Cardiology

## 2019-02-19 DIAGNOSIS — I10 Essential (primary) hypertension: Secondary | ICD-10-CM | POA: Diagnosis not present

## 2019-02-19 DIAGNOSIS — C3491 Malignant neoplasm of unspecified part of right bronchus or lung: Secondary | ICD-10-CM | POA: Diagnosis not present

## 2019-02-19 DIAGNOSIS — E7800 Pure hypercholesterolemia, unspecified: Secondary | ICD-10-CM | POA: Diagnosis not present

## 2019-02-19 DIAGNOSIS — J449 Chronic obstructive pulmonary disease, unspecified: Secondary | ICD-10-CM | POA: Diagnosis not present

## 2019-02-19 DIAGNOSIS — I4819 Other persistent atrial fibrillation: Secondary | ICD-10-CM | POA: Diagnosis not present

## 2019-02-19 NOTE — Telephone Encounter (Signed)
Spoke with pt who reports he does have trouble hearing and needs his wife to be with him at the appt.  Advised she will be screened prior to and must wear a mask.  He states understanding.

## 2019-02-19 NOTE — Telephone Encounter (Signed)
New Message:      Wife called. Pt have an appt on Tuesday with Dr Marlou Porch, she will need to come in with pt for his appt. She says pt can not hear well.

## 2019-02-22 ENCOUNTER — Inpatient Hospital Stay: Payer: Medicare HMO

## 2019-02-22 ENCOUNTER — Other Ambulatory Visit: Payer: Self-pay

## 2019-02-22 ENCOUNTER — Encounter: Payer: Self-pay | Admitting: Cardiology

## 2019-02-22 ENCOUNTER — Inpatient Hospital Stay (HOSPITAL_BASED_OUTPATIENT_CLINIC_OR_DEPARTMENT_OTHER): Payer: Medicare HMO | Admitting: Physician Assistant

## 2019-02-22 VITALS — BP 124/84 | HR 71 | Temp 98.5°F | Resp 17 | Ht 72.0 in | Wt 184.4 lb

## 2019-02-22 DIAGNOSIS — C3491 Malignant neoplasm of unspecified part of right bronchus or lung: Secondary | ICD-10-CM

## 2019-02-22 DIAGNOSIS — C342 Malignant neoplasm of middle lobe, bronchus or lung: Secondary | ICD-10-CM | POA: Diagnosis not present

## 2019-02-22 DIAGNOSIS — C3431 Malignant neoplasm of lower lobe, right bronchus or lung: Secondary | ICD-10-CM

## 2019-02-22 DIAGNOSIS — Z7901 Long term (current) use of anticoagulants: Secondary | ICD-10-CM | POA: Diagnosis not present

## 2019-02-22 DIAGNOSIS — Z79899 Other long term (current) drug therapy: Secondary | ICD-10-CM | POA: Diagnosis not present

## 2019-02-22 DIAGNOSIS — Z5112 Encounter for antineoplastic immunotherapy: Secondary | ICD-10-CM

## 2019-02-22 DIAGNOSIS — I4891 Unspecified atrial fibrillation: Secondary | ICD-10-CM | POA: Diagnosis not present

## 2019-02-22 LAB — CBC WITH DIFFERENTIAL (CANCER CENTER ONLY)
Abs Immature Granulocytes: 0.01 10*3/uL (ref 0.00–0.07)
Basophils Absolute: 0.1 10*3/uL (ref 0.0–0.1)
Basophils Relative: 1 %
Eosinophils Absolute: 0.3 10*3/uL (ref 0.0–0.5)
Eosinophils Relative: 6 %
HCT: 39.2 % (ref 39.0–52.0)
Hemoglobin: 13.9 g/dL (ref 13.0–17.0)
Immature Granulocytes: 0 %
Lymphocytes Relative: 19 %
Lymphs Abs: 1 10*3/uL (ref 0.7–4.0)
MCH: 39.9 pg — ABNORMAL HIGH (ref 26.0–34.0)
MCHC: 35.5 g/dL (ref 30.0–36.0)
MCV: 112.6 fL — ABNORMAL HIGH (ref 80.0–100.0)
Monocytes Absolute: 0.7 10*3/uL (ref 0.1–1.0)
Monocytes Relative: 14 %
Neutro Abs: 3.1 10*3/uL (ref 1.7–7.7)
Neutrophils Relative %: 60 %
Platelet Count: 193 10*3/uL (ref 150–400)
RBC: 3.48 MIL/uL — ABNORMAL LOW (ref 4.22–5.81)
RDW: 15 % (ref 11.5–15.5)
WBC Count: 5.1 10*3/uL (ref 4.0–10.5)
nRBC: 0 % (ref 0.0–0.2)

## 2019-02-22 LAB — CMP (CANCER CENTER ONLY)
ALT: 14 U/L (ref 0–44)
AST: 16 U/L (ref 15–41)
Albumin: 3.5 g/dL (ref 3.5–5.0)
Alkaline Phosphatase: 82 U/L (ref 38–126)
Anion gap: 13 (ref 5–15)
BUN: 12 mg/dL (ref 8–23)
CO2: 21 mmol/L — ABNORMAL LOW (ref 22–32)
Calcium: 9.3 mg/dL (ref 8.9–10.3)
Chloride: 102 mmol/L (ref 98–111)
Creatinine: 0.82 mg/dL (ref 0.61–1.24)
GFR, Est AFR Am: >60 mL/min (ref >60)
GFR, Estimated: >60 mL/min (ref >60)
Glucose, Bld: 98 mg/dL (ref 70–99)
Potassium: 4.5 mmol/L (ref 3.5–5.1)
Sodium: 136 mmol/L (ref 135–145)
Total Bilirubin: 0.8 mg/dL (ref 0.3–1.2)
Total Protein: 6.6 g/dL (ref 6.5–8.1)

## 2019-02-22 MED ORDER — SODIUM CHLORIDE 0.9 % IV SOLN
10.2000 mg/kg | Freq: Once | INTRAVENOUS | Status: AC
  Administered 2019-02-22: 860 mg via INTRAVENOUS
  Filled 2019-02-22: qty 10

## 2019-02-22 MED ORDER — SODIUM CHLORIDE 0.9 % IV SOLN
Freq: Once | INTRAVENOUS | Status: AC
  Administered 2019-02-22: 09:00:00 via INTRAVENOUS
  Filled 2019-02-22: qty 250

## 2019-02-22 NOTE — Patient Instructions (Signed)
[facility] Discharge Instructions for Patients Receiving Chemotherapy  Today you received the following Immunotherapy: Durvalumab  To help prevent nausea and vomiting after your treatment, we encourage you to take your nausea medication as directed by your MD.   If you develop nausea and vomiting that is not controlled by your nausea medication, call the clinic.   BELOW ARE SYMPTOMS THAT SHOULD BE REPORTED IMMEDIATELY:  *FEVER GREATER THAN 100.5 F  *CHILLS WITH OR WITHOUT FEVER  NAUSEA AND VOMITING THAT IS NOT CONTROLLED WITH YOUR NAUSEA MEDICATION  *UNUSUAL SHORTNESS OF BREATH  *UNUSUAL BRUISING OR BLEEDING  TENDERNESS IN MOUTH AND THROAT WITH OR WITHOUT PRESENCE OF ULCERS  *URINARY PROBLEMS  *BOWEL PROBLEMS  UNUSUAL RASH Items with * indicate a potential emergency and should be followed up as soon as possible.  Feel free to call the clinic should you have any questions or concerns. The clinic phone number is [phone].  Please show the [place] at check-in to the Emergency Department and triage nurse. Coronavirus (COVID-19) Are you at risk?  Are you at risk for the Coronavirus (COVID-19)?  To be considered HIGH RISK for Coronavirus (COVID-19), you have to meet the following criteria:  . Traveled to [place], [place], [place], [place] or [place]; or in the [place] to [place], [place], [place], or [place]; and have fever, cough, and shortness of breath within the last 2 weeks of travel OR . Been in close contact with a person diagnosed with COVID-19 within the last 2 weeks and have fever, cough, and shortness of breath . IF YOU DO NOT MEET THESE CRITERIA, YOU ARE CONSIDERED LOW RISK FOR COVID-19.  What to do if you are HIGH RISK for COVID-19?  Marland Kitchen If you are having a medical emergency, call 911. . Seek medical care right away. Before you go to a doctor's office, urgent care or emergency department, call ahead and tell them about your  recent travel, contact with someone diagnosed with COVID-19, and your symptoms. You should receive instructions from your physician's office regarding next steps of care.  . When you arrive at healthcare provider, tell the healthcare staff immediately you have returned from visiting [place], [place], [place], [place] or [place]; or traveled in the [place] to [place], [place], [place], or [place]; in the last two weeks or you have been in close contact with a person diagnosed with COVID-19 in the last 2 weeks.   . Tell the health care staff about your symptoms: fever, cough and shortness of breath. . After you have been seen by a medical provider, you will be either: o Tested for (COVID-19) and discharged home on quarantine except to seek medical care if symptoms worsen, and asked to  - Stay home and avoid contact with others until you get your results (4-5 days)  - Avoid travel on public transportation if possible (such as bus, train, or airplane) or o Sent to the Emergency Department by EMS for evaluation, COVID-19 testing, and possible admission depending on your condition and test results.  What to do if you are LOW RISK for COVID-19?  Reduce your risk of any infection by using the same precautions used for avoiding the common cold or flu:  Marland Kitchen Wash your hands often with soap and warm water for at least 20 seconds.  If soap and water are not readily available, use an alcohol-based hand sanitizer with at least 60% alcohol.  . If coughing or  sneezing, cover your mouth and nose by coughing or sneezing into the elbow areas of your shirt or coat, into a tissue or into your sleeve (not your hands). . Avoid shaking hands with others and consider head nods or verbal greetings only. . Avoid touching your eyes, nose, or mouth with unwashed hands.  . Avoid close contact with people who are sick. . Avoid places or events with large numbers of people in one location, like concerts or sporting  events. . Carefully consider travel plans you have or are making. . If you are planning any travel outside or inside the [place], visit the CDC's Travelers' Health webpage for the latest health notices. . If you have some symptoms but not all symptoms, continue to monitor at home and seek medical attention if your symptoms worsen. . If you are having a medical emergency, call 911.   [facility] / e-Visit: [email or web address]         [facility]: [facility]: [phone]                   [facility]: [phone]

## 2019-02-22 NOTE — Progress Notes (Signed)
[facility] OFFICE PROGRESS NOTE  [name], MD Joe Cole [id number]  DIAGNOSIS: Stage IIIA (T3, N1, M0) non-small cell lung cancer, squamous cell carcinoma diagnosed in July 2020 and presented with right middle lobe mass and right hilar adenopathy with postobstructive pneumonia and suspicious right pleural effusion.  PRIOR THERAPY: Weekly concurrent chemoradiation with Carboplatin for an AUC of 2 and paclitaxel 45 mg/m2. First dose 11/23/2018. Status post 7 cycles.  CURRENT THERAPY: Consolidation immunotherapy with Imfinzi 10 mg/KG every 2 weeks.  First dose February 08, 2019. Status post 1 cycle.  INTERVAL HISTORY: Joe Cole 68 y.o. male returns to the clinic for a follow up visit. The patient is feeling well today without any concerning complaints. The patient is status post his first cycle of immunotherapy with Imfinzi and he tolerated it well without any adverse side effects. Denies any fever, chills, night sweats, or weight loss. Denies any chest pain, cough, or hemoptysis. He denies any changes in his shortness of breath compared to his baseline. Denies any nausea, vomiting, diarrhea, or constipation. Denies any headache or visual changes. Denies any rashes or skin changes. The patient is here today for evaluation prior to starting cycle # 2  MEDICAL HISTORY: Past Medical History:  Diagnosis Date  . Arthritis   . Atrial fibrillation ([place]) 10/2018  . COPD with emphysema ([place])   . Dyspnea   . Hypertension   . lung ca dx'd 10/2018    ALLERGIES:  has No Known Allergies.  MEDICATIONS:  Current Outpatient Medications  Medication Sig Dispense Refill  . acetaminophen (TYLENOL) 500 MG tablet Take 1,000 mg by mouth every 6 (six) hours as needed for moderate pain or headache.    . albuterol (PROAIR HFA) 108 (90 Base) MCG/ACT inhaler Inhale 2 puffs into the lungs every 6 (six) hours as needed for wheezing or shortness of breath. 1  Inhaler 5  . apixaban (ELIQUIS) 5 MG TABS tablet Take 1 tablet (5 mg total) by mouth 2 (two) times daily. 60 tablet 3  . atorvastatin (LIPITOR) 20 MG tablet Take 20 mg by mouth daily.    Marland Kitchen diltiazem (CARDIZEM CD) 360 MG 24 hr capsule Take 1 capsule (360 mg total) by mouth daily. 30 capsule 3  . metoprolol succinate (TOPROL XL) 25 MG 24 hr tablet Take 1 tablet (25 mg total) by mouth at bedtime. 30 tablet 11  . metoprolol succinate (TOPROL XL) 50 MG 24 hr tablet Take 1 tablet (50 mg total) by mouth at bedtime. Take with or immediately following a meal. 30 tablet 11  . Probiotic Product (ALIGN) 4 MG CAPS Take 4 mg by mouth daily.    . prochlorperazine (COMPAZINE) 10 MG tablet Take 1 tablet (10 mg total) by mouth every 6 (six) hours as needed for nausea or vomiting. 30 tablet 0  . traZODone (DESYREL) 50 MG tablet TAKE 1 TABLET BY MOUTH EVERYDAY AT BEDTIME (Patient taking differently: Take 50 mg by mouth at bedtime as needed for sleep. ) 90 tablet 1  . umeclidinium-vilanterol (ANORO ELLIPTA) 62.5-25 MCG/INH AEPB Inhale 1 puff into the lungs daily. 1 each 6   No current facility-administered medications for this visit.    Facility-Administered Medications Ordered in Other Visits  Medication Dose Route Frequency Provider Last Rate Last Dose  . durvalumab (IMFINZI) 840 mg in sodium chloride 0.9 % 100 mL chemo infusion  10 mg/kg (Treatment Plan Recorded) Intravenous Once Curt Bears, MD  SURGICAL HISTORY:  Past Surgical History:  Procedure Laterality Date  . CARDIOVERSION N/A 01/11/2019   Procedure: CARDIOVERSION;  Surgeon: Josue Hector, MD;  [facility];  Service: Cardiovascular;  Laterality: N/A;  . KNEE ARTHROSCOPY     BIL   . TOE SURGERY     PINNED LEFT BIG TOE  . TONSILLECTOMY     T+A  AS CHILD  . TOTAL KNEE ARTHROPLASTY Left 08/19/2017   Procedure: TOTAL KNEE ARTHROPLASTY;  Surgeon: Renette Butters, MD;  Location: [facility];  Service: Orthopedics;  Laterality: Left;   Marland Kitchen VIDEO BRONCHOSCOPY Bilateral 11/05/2018   Procedure: VIDEO BRONCHOSCOPY WITH FLUORO;  Surgeon: [name], MD;  Location: [facility] ENDOSCOPY;  Service: Cardiopulmonary;  Laterality: Bilateral;  . VIDEO BRONCHOSCOPY Bilateral 11/09/2018   Procedure: VIDEO BRONCHOSCOPY WITH FLUORO;  Surgeon: Rigoberto Noel, MD;  Location: [facility];  Service: Cardiopulmonary;  Laterality: Bilateral;    REVIEW OF SYSTEMS:   Review of Systems  Constitutional: Negative for appetite change, chills, fatigue, fever and unexpected weight change.  HENT: Negative for mouth sores, nosebleeds, sore throat and trouble swallowing.   Eyes: Negative for eye problems and icterus.  Respiratory: Positive for baseline shortness of breath. Negative for cough, hemoptysis, and wheezing.   Cardiovascular: Positive for bilateral lower extremity swelling. Negative for chest pain. Gastrointestinal: Negative for abdominal pain, constipation, diarrhea, nausea and vomiting.  Genitourinary: Negative for bladder incontinence, difficulty urinating, dysuria, frequency and hematuria.   Musculoskeletal: Negative for back pain, gait problem, neck pain and neck stiffness.  Skin: Negative for itching and rash.  Neurological: Negative for dizziness, extremity weakness, gait problem, headaches, light-headedness and seizures.  Hematological: Negative for adenopathy. Does not bruise/bleed easily.  Psychiatric/Behavioral: Negative for confusion, depression and sleep disturbance. The patient is not nervous/anxious.     PHYSICAL EXAMINATION:  Blood pressure 124/84, pulse 71, temperature 98.5 F (36.9 C), temperature source Oral, resp. rate 17, height 6' (1.829 m), weight 184 lb 6.4 oz (83.6 kg), SpO2 99 %.  ECOG PERFORMANCE STATUS: 1 - Symptomatic but completely ambulatory  Physical Exam  Constitutional: Oriented to person, place, and time and well-developed, well-nourished, and in no distress.  HENT:  Head: Normocephalic and atraumatic.   Mouth/Throat: Oropharynx is clear and moist. No oropharyngeal exudate.  Eyes: Conjunctivae are normal. Right eye exhibits no discharge. Left eye exhibits no discharge. No scleral icterus.  Neck: Normal range of motion. Neck supple.  Cardiovascular: Normal rate, irregular rhythm, normal heart sounds and intact distal pulses.   Pulmonary/Chest: Effort normal and breath sounds normal. No respiratory distress. No wheezes. No rales.  Abdominal: Soft. Bowel sounds are normal. Exhibits no distension and no mass. There is no tenderness.  Musculoskeletal: Bilateral lower extremity swelling. Normal range of motion. Lymphadenopathy:    No cervical adenopathy.  Neurological: Alert and oriented to person, place, and time. Exhibits normal muscle tone. Gait normal. Coordination normal.  Skin: Skin is warm and dry. No rash noted. Not diaphoretic. No erythema. No pallor.  Psychiatric: Mood, memory and judgment normal.  Vitals reviewed.  LABORATORY DATA: Lab Results  Component Value Date   WBC 5.1 02/22/2019   HGB 13.9 02/22/2019   HCT 39.2 02/22/2019   MCV 112.6 (H) 02/22/2019   PLT 193 02/22/2019      Chemistry      Component Value Date/Time   NA 136 02/22/2019 0749   K 4.5 02/22/2019 0749   CL 102 02/22/2019 0749   CO2 21 (L) 02/22/2019 0749   BUN 12  02/22/2019 0749   CREATININE 0.82 02/22/2019 0749      Component Value Date/Time   CALCIUM 9.3 02/22/2019 0749   ALKPHOS 82 02/22/2019 0749   AST 16 02/22/2019 0749   ALT 14 02/22/2019 0749   BILITOT 0.8 02/22/2019 0749       RADIOGRAPHIC STUDIES:  Ct Chest W Contrast  Result Date: 01/26/2019 CLINICAL DATA:  Stage IIIA squamous cell right lung cancer diagnosed July 2020 with interval concurrent chemo radiation therapy. Restaging. EXAM: CT CHEST WITH CONTRAST TECHNIQUE: Multidetector CT imaging of the chest was performed during intravenous contrast administration. CONTRAST:  41mL OMNIPAQUE IOHEXOL 300 MG/ML  SOLN COMPARISON:  10/20/2018  chest CT angiogram.  10/29/2018 PET-CT. FINDINGS: Cardiovascular: Normal heart size. No significant pericardial effusion/thickening. Left anterior descending coronary atherosclerosis. Atherosclerotic nonaneurysmal thoracic aorta. Normal caliber pulmonary arteries. No central pulmonary emboli. Mediastinum/Nodes: No discrete thyroid nodules. Unremarkable esophagus. No axillary adenopathy. Previously visualized mildly enlarged 1.1 cm right paratracheal node on 10/20/2018 chest CT angiogram study is decreased to 0.7 cm (series 2/image 69). No pathologically enlarged mediastinal nodes. No left hilar adenopathy. Lungs/Pleura: No pneumothorax. Small dependent right pleural effusion slightly increased. No left pleural effusion. Poorly marginated 2.7 x 1.7 cm right infrahilar pulmonary nodule (series 2/image 104), decreased from 4.6 x 3.1 cm on 10/20/2018 chest CT using similar measurement technique. Persistent but improved narrowing of the right middle lobe bronchus. Right lower lobe bronchus is now patent. Near complete consolidation of the right middle lobe is similar, with worsened volume loss in the right middle lobe. Residual thin parenchymal bands in the basilar right lower lobe. No acute consolidative airspace disease or new significant pulmonary nodules. Stable tiny calcified right upper lobe granuloma. Mild centrilobular emphysema. Upper abdomen: Stable mild thickening of both adrenal glands without discrete adrenal nodules. Musculoskeletal: No aggressive appearing focal osseous lesions. Marked thoracic spondylosis. IMPRESSION: 1. Interval response to therapy. Poorly marginated right infrahilar mass is decreased in size. Persistent but improved narrowing of the right middle lobe bronchus. Right lower lobe bronchus is now patent. 2. No new or progressive metastatic disease in the chest. Right paratracheal adenopathy has decreased. 3. Small dependent right pleural effusion is slightly increased. 4. Persistent near  complete consolidation of the right middle lobe with worsening right middle lobe volume loss, compatible with evolving postobstructive scarring/atelectasis. Aortic Atherosclerosis (ICD10-I70.0) and Emphysema (ICD10-J43.9). Electronically Signed   By: Ilona Sorrel M.D.   On: 01/26/2019 13:34     ASSESSMENT/PLAN:  This is a very pleasant 68 year old Caucasian male recently diagnosed with stage IIIa (T3, N1, M0) non-small cell lung cancer, squamous cell carcinoma.  He presented with a right middle lobe lung mass and right hilar adenopathy with postobstructive pneumonia and a suspicious right pleural effusion.  He was diagnosed in July 2020.  The patient completed 5 cycles of weekly concurrent chemoradiation with carboplatin for an AUC of 2 and paclitaxel 45 mgm/2.   He is currently undergoing consolidation immunotherapy with Imfinzi 10 mg/kg IV every 2 weeks. He is status post his first cycle and tolerated it well without any adverse side effects.   Labs were reviewed. I recommend that he proceed with cycle 2 today as scheduled. We will see him back for a follow up visit in 2 weeks for evaluation before starting cycle #3.   The patient was advised to call immediately if he has any concerning symptoms in the interval. The patient voices understanding of current disease status and treatment options and is in agreement with the current  care plan. All questions were answered. The patient knows to call the clinic with any problems, questions or concerns. We can certainly see the patient much sooner if necessary  No orders of the defined types were placed in this encounter.    Cassandra L Heilingoetter, PA-C 02/22/19

## 2019-02-23 ENCOUNTER — Encounter: Payer: Self-pay | Admitting: Cardiology

## 2019-02-23 ENCOUNTER — Ambulatory Visit: Payer: Medicare HMO | Admitting: Cardiology

## 2019-02-23 ENCOUNTER — Telehealth: Payer: Self-pay | Admitting: Physician Assistant

## 2019-02-23 VITALS — BP 120/70 | HR 74 | Ht 72.0 in | Wt 182.8 lb

## 2019-02-23 DIAGNOSIS — I4819 Other persistent atrial fibrillation: Secondary | ICD-10-CM | POA: Diagnosis not present

## 2019-02-23 DIAGNOSIS — I10 Essential (primary) hypertension: Secondary | ICD-10-CM | POA: Diagnosis not present

## 2019-02-23 DIAGNOSIS — C3410 Malignant neoplasm of upper lobe, unspecified bronchus or lung: Secondary | ICD-10-CM | POA: Diagnosis not present

## 2019-02-23 NOTE — Telephone Encounter (Signed)
Scheduled per los. Called and left msg. Mailed printout  °

## 2019-02-23 NOTE — Patient Instructions (Signed)
Medication Instructions:  The current medical regimen is effective;  continue present plan and medications.  *If you need a refill on your cardiac medications before your next appointment, please call your pharmacy*  Follow-Up: At [facility], you and your health needs are our priority.  As part of our continuing mission to provide you with exceptional heart care, we have created designated Provider Care Teams.  These Care Teams include your primary Cardiologist (physician) and Advanced Practice Providers (APPs -  Physician Assistants and Nurse Practitioners) who all work together to provide you with the care you need, when you need it.  Your next appointment:   6 months  The format for your next appointment:   In Person  Provider:   At [facility]   Follow up in 1 year with Dr. Marlou Porch.  You will receive a letter in the mail 2 months before you are due.  Please call us when you receive this letter to schedule your follow up appointment.  Thank you for choosing [facility]!!

## 2019-02-23 NOTE — Progress Notes (Signed)
Cardiology Office Note:    Date:  02/23/2019   ID:  Joe Cole, DOB 26-Jul-1950, MRN [id number]  PCP:  [name], MD  Cardiologist:  Candee Furbish, MD  Electrophysiologist:  None   Referring MD: [name], MD     History of Present Illness:    Joe Cole is a 68 y.o. male with tobacco use hypertension hyperlipidemia COPD emphysema lung cancer, persistent atrial fibrillation seen previously on 01/20/2019 by atrial fibrillation clinic here for follow-up.  Diagnosed with A. fib on 11/05/2018 after presenting for bronchoscopy.  Heart rate was 140.  Asymptomatic.  Occasional edema in legs.  Started on Toprol and anticoagulation.  1-2 drinks EtOH daily.  He was cardioverted on 01/12/2019 but then had 1 to 2 days later return of atrial fibrillation.  Only felt minimal improvement with cardioversion.  He was seen with good rate control and stable.  Most recent ECG showed A. fib 94 bpm QTC 462.  Overall he has been doing quite well with good rate control.  No bleeding fevers chills nausea vomiting syncope.  Taking his medications.  Good questions asked.  Past Medical History:  Diagnosis Date   Arthritis    Atrial fibrillation ([place]) 10/2018   COPD with emphysema ([place])    Dyspnea    Hypertension    lung ca dx'd 10/2018    Past Surgical History:  Procedure Laterality Date   CARDIOVERSION N/A 01/11/2019   Procedure: CARDIOVERSION;  Surgeon: Josue Hector, MD;  Location[facility] END[facility]COPY;  Service: Cardiovascular;  Laterality: N/A;   KNEE ARTHROSCOPY     BIL    TOE SURGERY     PINNED LEFT BIG TOE   TONSILLECTOMY     T+A  AS CHILD   TOTAL KNEE ARTHROPLASTY Left 08/19/2017   Procedure: TOTAL KNEE ARTHROPLASTY;  Surgeon: Renette Butters, MD;  Location: [place];  Service: Orthopedics;  Laterality: Left;   VIDEO BRONCHOSCOPY Bilateral 11/05/2018   Procedure: VIDEO BRONCHOSCOPY WITH FLUORO;  Surgeon: [name], MD;  Location: [place];  Service:  Cardiopulmonary;  Laterality: Bilateral;   VIDEO BRONCHOSCOPY Bilateral 11/09/2018   Procedure: VIDEO BRONCHOSCOPY WITH FLUORO;  Surgeon: Rigoberto Noel, MD;  Location: [place];  Service: Cardiopulmonary;  Laterality: Bilateral;    Current Medications: Current Meds  Medication Sig   acetaminophen (TYLENOL) 500 MG tablet Take 1,000 mg by mouth every 6 (six) hours as needed for moderate pain or headache.   albuterol (PROAIR HFA) 108 (90 Base) MCG/ACT inhaler Inhale 2 puffs into the lungs every 6 (six) hours as needed for wheezing or shortness of breath.   apixaban (ELIQUIS) 5 MG TABS tablet Take 1 tablet (5 mg total) by mouth 2 (two) times daily.   atorvastatin (LIPITOR) 20 MG tablet Take 20 mg by mouth daily.   diltiazem (CARDIZEM CD) 360 MG 24 hr capsule Take 1 capsule (360 mg total) by mouth daily.   metoprolol succinate (TOPROL XL) 25 MG 24 hr tablet Take 1 tablet (25 mg total) by mouth at bedtime.   metoprolol succinate (TOPROL XL) 50 MG 24 hr tablet Take 1 tablet (50 mg total) by mouth at bedtime. Take with or immediately following a meal.   Probiotic Product (ALIGN) 4 MG CAPS Take 4 mg by mouth daily.   prochlorperazine (COMPAZINE) 10 MG tablet Take 1 tablet (10 mg total) by mouth every 6 (six) hours as needed for nausea or vomiting.   traZODone (DESYREL) 50 MG tablet TAKE 1 TABLET BY MOUTH EVERYDAY  AT BEDTIME   umeclidinium-vilanterol (ANORO ELLIPTA) 62.5-25 MCG/INH AEPB Inhale 1 puff into the lungs daily.     Allergies:   Patient has no known allergies.   Social History   Socioeconomic History   Marital status: Married    Spouse name: Not on file   Number of children: Not on file   Years of education: Not on file   Highest education level: Not on file  Occupational History   Not on file  Social Needs   Financial resource strain: Not on file   Food insecurity    Worry: Not on file    Inability: Not on file   Transportation needs    Medical: Not on  file    Non-medical: Not on file  Tobacco Use   Smoking status: Former Smoker    Packs/day: 1.50    Years: 46.00    Pack years: 69.00    Types: Cigarettes    Quit date: 09/08/2018    Years since quitting: 0.4   Smokeless tobacco: Never Used  Substance and Sexual Activity   Alcohol use: Yes    Alcohol/week: 19.0 standard drinks    Types: 7 Cans of beer, 12 Shots of liquor per week    Comment: FEW DRINKS AFTER WORK   Drug use: Never   Sexual activity: Not on file  Lifestyle   Physical activity    Days per week: Not on file    Minutes per session: Not on file   Stress: Not on file  Relationships   Social connections    Talks on phone: Not on file    Gets together: Not on file    Attends religious service: Not on file    Active member of club or organization: Not on file    Attends meetings of clubs or organizations: Not on file    Relationship status: Not on file  Other Topics Concern   Not on file  Social History Narrative   Not on file     Family History: The patient's family history includes Atrial fibrillation in his mother; Emphysema in his father; Pneumonia in his father.  ROS:   Please see the history of present illness.     All other systems reviewed and are negative.  EKGs/Labs/Other Studies Reviewed:    The following studies were reviewed today:  Echo 11/06/18 demonstrated  1. The left ventricle has low normal systolic function, with an ejection fraction of 50-55%. The cavity size was mildly dilated. Left ventricular diastolic Doppler parameters are indeterminate. 2. Extremely poor acoustic windows limit study. 3. The right ventricle was not well visualized. The cavity was moderately enlarged. There is not assessed. 4. RV is not well seen RVEF is at least mildly depressed. 5. Left atrial size was severely dilated. 6. Right atrial size was severely dilated. 7. The mitral valve is abnormal. Mild thickening of the mitral valve leaflet. There is  mild mitral annular calcification present. 8. The tricuspid valve is grossly normal. 9. The aortic valve is abnormal. Mild thickening of the aortic valve. Mild calcification of the aortic valve. Aortic valve regurgitation is trivial by color flow Doppler. 10. The inferior vena cava was dilated in size with <50% respiratory variability.  EKG: A. fib 98   Recent Labs: 11/26/2018: Magnesium 1.8 02/08/2019: TSH 2.356 02/22/2019: ALT 14; BUN 12; Creatinine 0.82; Hemoglobin 13.9; Platelet Count 193; Potassium 4.5; Sodium 136  Recent Lipid Panel No results found for: CHOL, TRIG, HDL, CHOLHDL, VLDL, LDLCALC, LDLDIRECT  Physical  Exam:    VS:  BP 120/70    Pulse 74    Ht 6' (1.829 m)    Wt 182 lb 12.8 oz (82.9 kg)    SpO2 97%    BMI 24.79 kg/m     Wt Readings from Last 3 Encounters:  02/23/19 182 lb 12.8 oz (82.9 kg)  02/22/19 184 lb 6.4 oz (83.6 kg)  02/17/19 181 lb 9.6 oz (82.4 kg)     GEN:  Well nourished, well developed in no acute distress HEENT: Normal NECK: No JVD; No carotid bruits LYMPHATICS: No lymphadenopathy CARDIAC: IRRR, no murmurs, rubs, gallops RESPIRATORY:  Clear to auscultation without rales, wheezing or rhonchi  ABDOMEN: Soft, non-tender, non-distended MUSCULOSKELETAL:  No edema; No deformity  SKIN: Warm and dry NEUROLOGIC:  Alert and oriented x 3 PSYCHIATRIC:  Normal affect   ASSESSMENT:    1. Persistent atrial fibrillation ([place])   2. Essential hypertension   3. Malignant neoplasm of upper lobe of lung, unspecified laterality ([place])    PLAN:    In order of problems listed above:  Persistent atrial fibrillation -Cardioversion was successful for only 1 to 2 days on 01/11/2019.  Severe biatrial enlargement noted.  Minimal improvement after cardioversion.  Long discussion took place about rhythm attempts once again with other medications and continued rate control.  They opted for rate control.  I think this makes good sense.  They are pleased with his overall  heart rate control at this time.  On Eliquis, diltiazem 360 and Toprol 75.  Chronic anticoagulation -CHA2DS2-VASc at least 2.  Continue with Eliquis.  This was his wife's main concern was possibility of stroke.  Her father had a stroke on aspirin with atrial fibrillation.  Essential hypertension -Well-controlled, stable.  COPD/lung cancer, stage III squamous cell -Dr. Earlie Server reviewed.  We will have him follow-up with the A. fib clinic in 6 months, me in 1 year.   Medication Adjustments/Labs and Tests Ordered: Current medicines are reviewed at length with the patient today.  Concerns regarding medicines are outlined above.  No orders of the defined types were placed in this encounter.  No orders of the defined types were placed in this encounter.   Patient Instructions  Medication Instructions:  The current medical regimen is effective;  continue present plan and medications.  *If you need a refill on your cardiac medications before your next appointment, please call your pharmacy*  Follow-Up: At [facility], you and your health needs are our priority.  As part of our continuing mission to provide you with exceptional heart care, we have created designated Provider Care Teams.  These Care Teams include your primary Cardiologist (physician) and Advanced Practice Providers (APPs -  Physician Assistants and Nurse Practitioners) who all work together to provide you with the care you need, when you need it.  Your next appointment:   6 months  The format for your next appointment:   In Person  Provider:   At Fib Clinic   Follow up in 1 year with Dr. Marlou Porch.  You will receive a letter in the mail 2 months before you are due.  Please call us when you receive this letter to schedule your follow up appointment.  Thank you for choosing [facility]!!         Signed, Candee Furbish, MD  02/23/2019 10:15 AM    [place]

## 2019-02-26 ENCOUNTER — Ambulatory Visit: Payer: Self-pay | Admitting: Pharmacist

## 2019-02-26 ENCOUNTER — Other Ambulatory Visit: Payer: Self-pay | Admitting: Pharmacist

## 2019-02-26 NOTE — Patient Outreach (Signed)
[facility] [facility]) Care Management  02/26/2019  Joe Cole [birth year] [id number]   Patient was called regarding medication assistance. Unfortunately, he did not answer the phone. HIPAA compliant message was left on his voicemail.  Patient has been called multiple times without success.  Plan: Close patient's case. Send closure letter to patient and provider.  Elayne Guerin, PharmD, [place] Clinical Pharmacist [phone]

## 2019-03-01 ENCOUNTER — Other Ambulatory Visit: Payer: Self-pay | Admitting: Pulmonary Disease

## 2019-03-08 ENCOUNTER — Inpatient Hospital Stay: Payer: Medicare HMO

## 2019-03-08 ENCOUNTER — Encounter: Payer: Self-pay | Admitting: Internal Medicine

## 2019-03-08 ENCOUNTER — Other Ambulatory Visit: Payer: Self-pay

## 2019-03-08 ENCOUNTER — Inpatient Hospital Stay: Payer: Medicare HMO | Attending: Internal Medicine | Admitting: Internal Medicine

## 2019-03-08 VITALS — BP 125/81 | HR 97 | Temp 98.2°F | Resp 18 | Ht 72.0 in | Wt 183.8 lb

## 2019-03-08 DIAGNOSIS — C3491 Malignant neoplasm of unspecified part of right bronchus or lung: Secondary | ICD-10-CM

## 2019-03-08 DIAGNOSIS — C342 Malignant neoplasm of middle lobe, bronchus or lung: Secondary | ICD-10-CM | POA: Diagnosis present

## 2019-03-08 DIAGNOSIS — Z5112 Encounter for antineoplastic immunotherapy: Secondary | ICD-10-CM

## 2019-03-08 DIAGNOSIS — I10 Essential (primary) hypertension: Secondary | ICD-10-CM | POA: Diagnosis not present

## 2019-03-08 DIAGNOSIS — C3431 Malignant neoplasm of lower lobe, right bronchus or lung: Secondary | ICD-10-CM | POA: Diagnosis not present

## 2019-03-08 DIAGNOSIS — Z79899 Other long term (current) drug therapy: Secondary | ICD-10-CM | POA: Diagnosis not present

## 2019-03-08 LAB — CBC WITH DIFFERENTIAL (CANCER CENTER ONLY)
Abs Immature Granulocytes: 0.02 10*3/uL (ref 0.00–0.07)
Basophils Absolute: 0.1 10*3/uL (ref 0.0–0.1)
Basophils Relative: 2 %
Eosinophils Absolute: 0.2 10*3/uL (ref 0.0–0.5)
Eosinophils Relative: 4 %
HCT: 38.6 % — ABNORMAL LOW (ref 39.0–52.0)
Hemoglobin: 13.4 g/dL (ref 13.0–17.0)
Immature Granulocytes: 0 %
Lymphocytes Relative: 21 %
Lymphs Abs: 1 10*3/uL (ref 0.7–4.0)
MCH: 40 pg — ABNORMAL HIGH (ref 26.0–34.0)
MCHC: 34.7 g/dL (ref 30.0–36.0)
MCV: 115.2 fL — ABNORMAL HIGH (ref 80.0–100.0)
Monocytes Absolute: 0.7 10*3/uL (ref 0.1–1.0)
Monocytes Relative: 14 %
Neutro Abs: 2.9 10*3/uL (ref 1.7–7.7)
Neutrophils Relative %: 59 %
Platelet Count: 226 10*3/uL (ref 150–400)
RBC: 3.35 MIL/uL — ABNORMAL LOW (ref 4.22–5.81)
RDW: 13.4 % (ref 11.5–15.5)
WBC Count: 4.9 10*3/uL (ref 4.0–10.5)
nRBC: 0 % (ref 0.0–0.2)

## 2019-03-08 LAB — CMP (CANCER CENTER ONLY)
ALT: 11 U/L (ref 0–44)
AST: 15 U/L (ref 15–41)
Albumin: 3.4 g/dL — ABNORMAL LOW (ref 3.5–5.0)
Alkaline Phosphatase: 84 U/L (ref 38–126)
Anion gap: 11 (ref 5–15)
BUN: 12 mg/dL (ref 8–23)
CO2: 22 mmol/L (ref 22–32)
Calcium: 9.2 mg/dL (ref 8.9–10.3)
Chloride: 103 mmol/L (ref 98–111)
Creatinine: 0.79 mg/dL (ref 0.61–1.24)
GFR, Est AFR Am: >60 mL/min (ref >60)
GFR, Estimated: >60 mL/min (ref >60)
Glucose, Bld: 91 mg/dL (ref 70–99)
Potassium: 4.7 mmol/L (ref 3.5–5.1)
Sodium: 136 mmol/L (ref 135–145)
Total Bilirubin: 0.7 mg/dL (ref 0.3–1.2)
Total Protein: 6.8 g/dL (ref 6.5–8.1)

## 2019-03-08 LAB — TSH: TSH: 1.791 u[IU]/mL (ref 0.320–4.118)

## 2019-03-08 MED ORDER — SODIUM CHLORIDE 0.9 % IV SOLN
Freq: Once | INTRAVENOUS | Status: AC
  Administered 2019-03-08: 11:00:00 via INTRAVENOUS
  Filled 2019-03-08: qty 250

## 2019-03-08 MED ORDER — SODIUM CHLORIDE 0.9 % IV SOLN
10.2000 mg/kg | Freq: Once | INTRAVENOUS | Status: AC
  Administered 2019-03-08: 860 mg via INTRAVENOUS
  Filled 2019-03-08: qty 10

## 2019-03-08 NOTE — Progress Notes (Signed)
[place] [phone]   [phone]  OFFICE PROGRESS NOTE  [name], MD [place] [id number]  DIAGNOSIS: Stage IIIA (T3, N1, M0) non-small cell lung cancer, squamous cell carcinoma diagnosed in July 2020 and presented with right middle lobe mass and right hilar adenopathy with postobstructive pneumonia and suspicious right pleural effusion.  PRIOR THERAPY:  Weekly concurrent chemoradiation with Carboplatin for an AUC of 2 and paclitaxel 45 mg/m2. First dose 11/23/2018. Status post 7 cycles.   CURRENT THERAPY: Consolidation immunotherapy with Imfinzi 10 mg/KG every 2 weeks.  First dose February 08, 2019.  Status post 2 cycles.  INTERVAL HISTORY: Joe Cole 68 y.o. male returns to the clinic today for follow-up visit.  The patient is feeling fine today with no concerning complaints.  He continues to tolerate his treatment with immunotherapy fairly well.  He has no chest pain, shortness of breath, cough or hemoptysis.  He denied having any fever or chills.  He has no nausea, vomiting, diarrhea or constipation.  He has no headache or visual changes.  Is here today for evaluation before starting cycle #3.  MEDICAL HISTORY: Past Medical History:  Diagnosis Date  . Arthritis   . Atrial fibrillation ([place]) 10/2018  . COPD with emphysema ([place])   . Dyspnea   . Hypertension   . lung ca dx'd 10/2018    ALLERGIES:  has No Known Allergies.  MEDICATIONS:  Current Outpatient Medications  Medication Sig Dispense Refill  . acetaminophen (TYLENOL) 500 MG tablet Take 1,000 mg by mouth every 6 (six) hours as needed for moderate pain or headache.    . albuterol (PROAIR HFA) 108 (90 Base) MCG/ACT inhaler Inhale 2 puffs into the lungs every 6 (six) hours as needed for wheezing or shortness of breath. 1 Inhaler 5  . apixaban (ELIQUIS) 5 MG TABS tablet Take 1 tablet (5 mg total) by mouth 2 (two) times daily. 60 tablet 3  .  atorvastatin (LIPITOR) 20 MG tablet Take 20 mg by mouth daily.    Marland Kitchen diltiazem (CARDIZEM CD) 360 MG 24 hr capsule Take 1 capsule (360 mg total) by mouth daily. 30 capsule 3  . metoprolol succinate (TOPROL XL) 25 MG 24 hr tablet Take 1 tablet (25 mg total) by mouth at bedtime. 30 tablet 11  . metoprolol succinate (TOPROL XL) 50 MG 24 hr tablet Take 1 tablet (50 mg total) by mouth at bedtime. Take with or immediately following a meal. 30 tablet 11  . Probiotic Product (ALIGN) 4 MG CAPS Take 4 mg by mouth daily.    . traZODone (DESYREL) 50 MG tablet TAKE 1 TABLET BY MOUTH EVERYDAY AT BEDTIME 90 tablet 1  . umeclidinium-vilanterol (ANORO ELLIPTA) 62.5-25 MCG/INH AEPB Inhale 1 puff into the lungs daily. 1 each 6  . prochlorperazine (COMPAZINE) 10 MG tablet Take 1 tablet (10 mg total) by mouth every 6 (six) hours as needed for nausea or vomiting. (Patient not taking: Reported on 03/08/2019) 30 tablet 0   No current facility-administered medications for this visit.     SURGICAL HISTORY:  Past Surgical History:  Procedure Laterality Date  . CARDIOVERSION N/A 01/11/2019   Procedure: CARDIOVERSION;  Surgeon: Josue Hector, MD;  Location: [facility] ENDOSCOPY;  Service: Cardiovascular;  Laterality: N/A;  . KNEE ARTHROSCOPY     BIL   . TOE SURGERY     PINNED LEFT BIG TOE  . TONSILLECTOMY     T+A  AS CHILD  . TOTAL KNEE ARTHROPLASTY Left 08/19/2017   Procedure: TOTAL KNEE ARTHROPLASTY;  Surgeon: Renette Butters, MD;  Location: [place];  Service: Orthopedics;  Laterality: Left;  Marland Kitchen VIDEO BRONCHOSCOPY Bilateral 11/05/2018   Procedure: VIDEO BRONCHOSCOPY WITH FLUORO;  Surgeon: [name], MD;  Location: [facility] ENDOSCOPY;  Service: Cardiopulmonary;  Laterality: Bilateral;  . VIDEO BRONCHOSCOPY Bilateral 11/09/2018   Procedure: VIDEO BRONCHOSCOPY WITH FLUORO;  Surgeon: Rigoberto Noel, MD;  Location: [place];  Service: Cardiopulmonary;  Laterality: Bilateral;    REVIEW OF SYSTEMS:  A comprehensive review of systems  was negative.   PHYSICAL EXAMINATION: General appearance: alert, cooperative and no distress Head: Normocephalic, without obvious abnormality, atraumatic Neck: no adenopathy, no JVD, supple, symmetrical, trachea midline and thyroid not enlarged, symmetric, no tenderness/mass/nodules Lymph nodes: Cervical, supraclavicular, and axillary nodes normal. Resp: clear to auscultation bilaterally Back: symmetric, no curvature. ROM normal. No CVA tenderness. Cardio: regular rate and rhythm, S1, S2 normal, no murmur, click, rub or gallop GI: soft, non-tender; bowel sounds normal; no masses,  no organomegaly Extremities: extremities normal, atraumatic, no cyanosis or edema  ECOG PERFORMANCE STATUS: 1 - Symptomatic but completely ambulatory  Blood pressure 125/81, pulse 97, temperature 98.2 F (36.8 C), temperature source Temporal, resp. rate 18, height 6' (1.829 m), weight 183 lb 13.6 oz (83.4 kg), SpO2 99 %.  LABORATORY DATA: Lab Results  Component Value Date   WBC 4.9 03/08/2019   HGB 13.4 03/08/2019   HCT 38.6 (L) 03/08/2019   MCV 115.2 (H) 03/08/2019   PLT 226 03/08/2019      Chemistry      Component Value Date/Time   NA 136 02/22/2019 0749   K 4.5 02/22/2019 0749   CL 102 02/22/2019 0749   CO2 21 (L) 02/22/2019 0749   BUN 12 02/22/2019 0749   CREATININE 0.82 02/22/2019 0749      Component Value Date/Time   CALCIUM 9.3 02/22/2019 0749   ALKPHOS 82 02/22/2019 0749   AST 16 02/22/2019 0749   ALT 14 02/22/2019 0749   BILITOT 0.8 02/22/2019 0749       RADIOGRAPHIC STUDIES: No results found.  ASSESSMENT AND PLAN: This is a very pleasant 68 years old white male with stage IIIA non-small cell lung cancer, squamous cell carcinoma.  The patient underwent a course of concurrent chemoradiation with weekly carboplatin and paclitaxel status post 7 cycles.  The patient tolerated his treatment well except for mild fatigue and odynophagia. He is currently undergoing consolidation treatment  with immunotherapy with Imfinzi status post 2 cycles.  He has been tolerating this treatment well with no concerning adverse effects. I recommended for the patient to proceed with cycle #3 today as planned. For the atrial fibrillation, he will continue with Eliquis. I will see him back for follow-up visit in 2 weeks for evaluation before the next cycle of his treatment. The patient was advised to call immediately if he has any concerning symptoms in the interval. The patient voices understanding of current disease status and treatment options and is in agreement with the current care plan. All questions were answered. The patient knows to call the clinic with any problems, questions or concerns. We can certainly see the patient much sooner if necessary.  Disclaimer: This note was dictated with voice recognition software. Similar sounding words can inadvertently be transcribed and may not be corrected upon review.

## 2019-03-08 NOTE — Patient Instructions (Signed)
[facility] Discharge Instructions for Patients Receiving Chemotherapy  Today you received the following Immunotherapy: Durvalumab  To help prevent nausea and vomiting after your treatment, we encourage you to take your nausea medication as directed by your MD.   If you develop nausea and vomiting that is not controlled by your nausea medication, call the clinic.   BELOW ARE SYMPTOMS THAT SHOULD BE REPORTED IMMEDIATELY:  *FEVER GREATER THAN 100.5 F  *CHILLS WITH OR WITHOUT FEVER  NAUSEA AND VOMITING THAT IS NOT CONTROLLED WITH YOUR NAUSEA MEDICATION  *UNUSUAL SHORTNESS OF BREATH  *UNUSUAL BRUISING OR BLEEDING  TENDERNESS IN MOUTH AND THROAT WITH OR WITHOUT PRESENCE OF ULCERS  *URINARY PROBLEMS  *BOWEL PROBLEMS  UNUSUAL RASH Items with * indicate a potential emergency and should be followed up as soon as possible.  Feel free to call the clinic should you have any questions or concerns. The clinic phone number is [phone].  Please show the [facility] at check-in to the Emergency Department and triage nurse. Coronavirus (COVID-19) Are you at risk?  Are you at risk for the Coronavirus (COVID-19)?  To be considered HIGH RISK for Coronavirus (COVID-19), you have to meet the following criteria:  . Traveled to [place], [place], [place], [place] or [place]; or in the [place] to [place], [place], [place], or [place]; and have fever, cough, and shortness of breath within the last 2 weeks of travel OR . Been in close contact with a person diagnosed with COVID-19 within the last 2 weeks and have fever, cough, and shortness of breath . IF YOU DO NOT MEET THESE CRITERIA, YOU ARE CONSIDERED LOW RISK FOR COVID-19.  What to do if you are HIGH RISK for COVID-19?  Marland Kitchen If you are having a medical emergency, call 911. . Seek medical care right away. Before you go to a doctor's office, urgent care or emergency department, call ahead and tell them about your  recent travel, contact with someone diagnosed with COVID-19, and your symptoms. You should receive instructions from your physician's office regarding next steps of care.  . When you arrive at healthcare provider, tell the healthcare staff immediately you have returned from visiting [place], [place], [place], [place] or [place]; or traveled in the [place] to [place], [place], [place], or [place]; in the last two weeks or you have been in close contact with a person diagnosed with COVID-19 in the last 2 weeks.   . Tell the health care staff about your symptoms: fever, cough and shortness of breath. . After you have been seen by a medical provider, you will be either: o Tested for (COVID-19) and discharged home on quarantine except to seek medical care if symptoms worsen, and asked to  - Stay home and avoid contact with others until you get your results (4-5 days)  - Avoid travel on public transportation if possible (such as bus, train, or airplane) or o Sent to the Emergency Department by EMS for evaluation, COVID-19 testing, and possible admission depending on your condition and test results.  What to do if you are LOW RISK for COVID-19?  Reduce your risk of any infection by using the same precautions used for avoiding the common cold or flu:  Marland Kitchen Wash your hands often with soap and warm water for at least 20 seconds.  If soap and water are not readily available, use an alcohol-based hand sanitizer with at least 60% alcohol.  . If coughing or  sneezing, cover your mouth and nose by coughing or sneezing into the elbow areas of your shirt or coat, into a tissue or into your sleeve (not your hands). . Avoid shaking hands with others and consider head nods or verbal greetings only. . Avoid touching your eyes, nose, or mouth with unwashed hands.  . Avoid close contact with people who are sick. . Avoid places or events with large numbers of people in one location, like concerts or sporting  events. . Carefully consider travel plans you have or are making. . If you are planning any travel outside or inside the [place], visit the CDC's Travelers' Health webpage for the latest health notices. . If you have some symptoms but not all symptoms, continue to monitor at home and seek medical attention if your symptoms worsen. . If you are having a medical emergency, call 911.   [place] / e-Visit: [email or web address]         [facility]: [facility]: [phone]                   [facility]: [phone]

## 2019-03-09 ENCOUNTER — Telehealth: Payer: Self-pay | Admitting: Internal Medicine

## 2019-03-09 ENCOUNTER — Other Ambulatory Visit: Payer: Self-pay | Admitting: *Deleted

## 2019-03-09 MED ORDER — APIXABAN 5 MG PO TABS: 5.0000 mg | ORAL_TABLET | Freq: Two times a day (BID) | ORAL | 9 refills | Status: DC

## 2019-03-09 NOTE — Telephone Encounter (Signed)
Pt last saw Dr Marlou Porch 02/23/19, last labs 03/08/19 Creat 0.79, age 68, weight 83.4kg, based on specified criteria pt is on appropriate dosage of Eliquis 5mg  BID.  Will refill rx.

## 2019-03-09 NOTE — Telephone Encounter (Signed)
scheduled per los. Called and left msg. Will get printout at next appt

## 2019-03-22 ENCOUNTER — Inpatient Hospital Stay (HOSPITAL_BASED_OUTPATIENT_CLINIC_OR_DEPARTMENT_OTHER): Payer: Medicare HMO | Admitting: Internal Medicine

## 2019-03-22 ENCOUNTER — Other Ambulatory Visit: Payer: Self-pay

## 2019-03-22 ENCOUNTER — Inpatient Hospital Stay: Payer: Medicare HMO

## 2019-03-22 ENCOUNTER — Encounter: Payer: Self-pay | Admitting: Internal Medicine

## 2019-03-22 VITALS — BP 126/75 | HR 68 | Temp 97.9°F | Resp 17 | Ht 73.0 in | Wt 184.3 lb

## 2019-03-22 DIAGNOSIS — C3431 Malignant neoplasm of lower lobe, right bronchus or lung: Secondary | ICD-10-CM

## 2019-03-22 DIAGNOSIS — Z5112 Encounter for antineoplastic immunotherapy: Secondary | ICD-10-CM

## 2019-03-22 DIAGNOSIS — C342 Malignant neoplasm of middle lobe, bronchus or lung: Secondary | ICD-10-CM | POA: Diagnosis not present

## 2019-03-22 DIAGNOSIS — C3491 Malignant neoplasm of unspecified part of right bronchus or lung: Secondary | ICD-10-CM | POA: Diagnosis not present

## 2019-03-22 DIAGNOSIS — Z79899 Other long term (current) drug therapy: Secondary | ICD-10-CM | POA: Diagnosis not present

## 2019-03-22 LAB — CMP (CANCER CENTER ONLY)
ALT: 18 U/L (ref 0–44)
AST: 19 U/L (ref 15–41)
Albumin: 3.3 g/dL — ABNORMAL LOW (ref 3.5–5.0)
Alkaline Phosphatase: 89 U/L (ref 38–126)
Anion gap: 12 (ref 5–15)
BUN: 10 mg/dL (ref 8–23)
CO2: 21 mmol/L — ABNORMAL LOW (ref 22–32)
Calcium: 9 mg/dL (ref 8.9–10.3)
Chloride: 105 mmol/L (ref 98–111)
Creatinine: 0.79 mg/dL (ref 0.61–1.24)
GFR, Est AFR Am: >60 mL/min (ref >60)
GFR, Estimated: >60 mL/min (ref >60)
Glucose, Bld: 103 mg/dL — ABNORMAL HIGH (ref 70–99)
Potassium: 4.4 mmol/L (ref 3.5–5.1)
Sodium: 138 mmol/L (ref 135–145)
Total Bilirubin: 0.6 mg/dL (ref 0.3–1.2)
Total Protein: 6.6 g/dL (ref 6.5–8.1)

## 2019-03-22 LAB — CBC WITH DIFFERENTIAL (CANCER CENTER ONLY)
Abs Immature Granulocytes: 0.03 10*3/uL (ref 0.00–0.07)
Basophils Absolute: 0.1 10*3/uL (ref 0.0–0.1)
Basophils Relative: 1 %
Eosinophils Absolute: 0.1 10*3/uL (ref 0.0–0.5)
Eosinophils Relative: 3 %
HCT: 39.8 % (ref 39.0–52.0)
Hemoglobin: 14 g/dL (ref 13.0–17.0)
Immature Granulocytes: 1 %
Lymphocytes Relative: 20 %
Lymphs Abs: 1.1 10*3/uL (ref 0.7–4.0)
MCH: 40 pg — ABNORMAL HIGH (ref 26.0–34.0)
MCHC: 35.2 g/dL (ref 30.0–36.0)
MCV: 113.7 fL — ABNORMAL HIGH (ref 80.0–100.0)
Monocytes Absolute: 0.7 10*3/uL (ref 0.1–1.0)
Monocytes Relative: 14 %
Neutro Abs: 3.3 10*3/uL (ref 1.7–7.7)
Neutrophils Relative %: 61 %
Platelet Count: 211 10*3/uL (ref 150–400)
RBC: 3.5 MIL/uL — ABNORMAL LOW (ref 4.22–5.81)
RDW: 12.4 % (ref 11.5–15.5)
WBC Count: 5.3 10*3/uL (ref 4.0–10.5)
nRBC: 0 % (ref 0.0–0.2)

## 2019-03-22 MED ORDER — SODIUM CHLORIDE 0.9 % IV SOLN
10.2000 mg/kg | Freq: Once | INTRAVENOUS | Status: AC
  Administered 2019-03-22: 860 mg via INTRAVENOUS
  Filled 2019-03-22: qty 10

## 2019-03-22 MED ORDER — SODIUM CHLORIDE 0.9 % IV SOLN
Freq: Once | INTRAVENOUS | Status: AC
  Administered 2019-03-22: 09:00:00 via INTRAVENOUS
  Filled 2019-03-22: qty 250

## 2019-03-22 NOTE — Progress Notes (Signed)
[place] [phone]   [phone]  OFFICE PROGRESS NOTE  [name], MD [place] [address]  DIAGNOSIS: Stage IIIA (T3, N1, M0) non-small cell lung cancer, squamous cell carcinoma diagnosed in July 2020 and presented with right middle lobe mass and right hilar adenopathy with postobstructive pneumonia and suspicious right pleural effusion.  PRIOR THERAPY:  Weekly concurrent chemoradiation with Carboplatin for an AUC of 2 and paclitaxel 45 mg/m2. First dose 11/23/2018. Status post 7 cycles.   CURRENT THERAPY: Consolidation immunotherapy with Imfinzi 10 mg/KG every 2 weeks.  First dose February 08, 2019.  Status post 3 cycles.  INTERVAL HISTORY: Joe Cole 68 y.o. male returns to the clinic today for follow-up visit.  The patient is feeling fine today with no concerning complaints.  He continues to tolerate his treatment with Imfinzi fairly well.  He denied having any current chest pain, shortness of breath, cough or hemoptysis.  He denied having any fever or chills.  He has no nausea, vomiting, diarrhea or constipation.  He has no headache or visual changes.  He is here today for evaluation before starting cycle #4.  MEDICAL HISTORY: Past Medical History:  Diagnosis Date  . Arthritis   . Atrial fibrillation ([place]) 10/2018  . COPD with emphysema ([place])   . Dyspnea   . Hypertension   . lung ca dx'd 10/2018    ALLERGIES:  has No Known Allergies.  MEDICATIONS:  Current Outpatient Medications  Medication Sig Dispense Refill  . acetaminophen (TYLENOL) 500 MG tablet Take 1,000 mg by mouth every 6 (six) hours as needed for moderate pain or headache.    . albuterol (PROAIR HFA) 108 (90 Base) MCG/ACT inhaler Inhale 2 puffs into the lungs every 6 (six) hours as needed for wheezing or shortness of breath. 1 Inhaler 5  . apixaban (ELIQUIS) 5 MG TABS tablet Take 1 tablet (5 mg total) by mouth 2 (two) times daily. 60  tablet 9  . atorvastatin (LIPITOR) 20 MG tablet Take 20 mg by mouth daily.    Marland Kitchen diltiazem (CARDIZEM CD) 360 MG 24 hr capsule Take 1 capsule (360 mg total) by mouth daily. 30 capsule 3  . metoprolol succinate (TOPROL XL) 25 MG 24 hr tablet Take 1 tablet (25 mg total) by mouth at bedtime. 30 tablet 11  . metoprolol succinate (TOPROL XL) 50 MG 24 hr tablet Take 1 tablet (50 mg total) by mouth at bedtime. Take with or immediately following a meal. 30 tablet 11  . Probiotic Product (ALIGN) 4 MG CAPS Take 4 mg by mouth daily.    . traZODone (DESYREL) 50 MG tablet TAKE 1 TABLET BY MOUTH EVERYDAY AT BEDTIME 90 tablet 1  . umeclidinium-vilanterol (ANORO ELLIPTA) 62.5-25 MCG/INH AEPB Inhale 1 puff into the lungs daily. 1 each 6  . prochlorperazine (COMPAZINE) 10 MG tablet Take 1 tablet (10 mg total) by mouth every 6 (six) hours as needed for nausea or vomiting. (Patient not taking: Reported on 03/08/2019) 30 tablet 0   No current facility-administered medications for this visit.     SURGICAL HISTORY:  Past Surgical History:  Procedure Laterality Date  . CARDIOVERSION N/A 01/11/2019   Procedure: CARDIOVERSION;  Surgeon: Josue Hector, MD;  Location: [facility] ENDOSCOPY;  Service: Cardiovascular;  Laterality: N/A;  . KNEE ARTHROSCOPY     BIL   . TOE SURGERY     PINNED LEFT BIG TOE  . TONSILLECTOMY  T+A  AS CHILD  . TOTAL KNEE ARTHROPLASTY Left 08/19/2017   Procedure: TOTAL KNEE ARTHROPLASTY;  Surgeon: Renette Butters, MD;  Location: [place];  Service: Orthopedics;  Laterality: Left;  Marland Kitchen VIDEO BRONCHOSCOPY Bilateral 11/05/2018   Procedure: VIDEO BRONCHOSCOPY WITH FLUORO;  Surgeon: [name], MD;  Location: [facility] ENDOSCOPY;  Service: Cardiopulmonary;  Laterality: Bilateral;  . VIDEO BRONCHOSCOPY Bilateral 11/09/2018   Procedure: VIDEO BRONCHOSCOPY WITH FLUORO;  Surgeon: Rigoberto Noel, MD;  Location: [place];  Service: Cardiopulmonary;  Laterality: Bilateral;    REVIEW OF SYSTEMS:  A comprehensive  review of systems was negative.   PHYSICAL EXAMINATION: General appearance: alert, cooperative and no distress Head: Normocephalic, without obvious abnormality, atraumatic Neck: no adenopathy, no JVD, supple, symmetrical, trachea midline and thyroid not enlarged, symmetric, no tenderness/mass/nodules Lymph nodes: Cervical, supraclavicular, and axillary nodes normal. Resp: clear to auscultation bilaterally Back: symmetric, no curvature. ROM normal. No CVA tenderness. Cardio: regular rate and rhythm, S1, S2 normal, no murmur, click, rub or gallop GI: soft, non-tender; bowel sounds normal; no masses,  no organomegaly Extremities: extremities normal, atraumatic, no cyanosis or edema  ECOG PERFORMANCE STATUS: 1 - Symptomatic but completely ambulatory  Blood pressure 126/75, pulse 68, temperature 97.9 F (36.6 C), temperature source Temporal, resp. rate 17, height 6\' 1"  (1.854 m), weight 184 lb 4.8 oz (83.6 kg), SpO2 97 %.  LABORATORY DATA: Lab Results  Component Value Date   WBC 5.3 03/22/2019   HGB 14.0 03/22/2019   HCT 39.8 03/22/2019   MCV 113.7 (H) 03/22/2019   PLT 211 03/22/2019      Chemistry      Component Value Date/Time   NA 136 03/08/2019 0940   K 4.7 03/08/2019 0940   CL 103 03/08/2019 0940   CO2 22 03/08/2019 0940   BUN 12 03/08/2019 0940   CREATININE 0.79 03/08/2019 0940      Component Value Date/Time   CALCIUM 9.2 03/08/2019 0940   ALKPHOS 84 03/08/2019 0940   AST 15 03/08/2019 0940   ALT 11 03/08/2019 0940   BILITOT 0.7 03/08/2019 0940       RADIOGRAPHIC STUDIES: No results found.  ASSESSMENT AND PLAN: This is a very pleasant 68 years old white male with stage IIIA non-small cell lung cancer, squamous cell carcinoma.  The patient underwent a course of concurrent chemoradiation with weekly carboplatin and paclitaxel status post 7 cycles.  The patient tolerated his treatment well except for mild fatigue and odynophagia. He is currently undergoing  consolidation treatment with immunotherapy with Imfinzi status post 3 cycles.  He continues to tolerate this treatment well with no concerning adverse effects. I recommended for him to proceed with cycle #4 today as planned. For the atrial fibrillation, he will continue with Eliquis. I will see him back for follow-up visit in 2 weeks for evaluation before starting cycle #5. He was advised to call immediately if he has any concerning symptoms in the interval. The patient voices understanding of current disease status and treatment options and is in agreement with the current care plan. All questions were answered. The patient knows to call the clinic with any problems, questions or concerns. We can certainly see the patient much sooner if necessary.  Disclaimer: This note was dictated with voice recognition software. Similar sounding words can inadvertently be transcribed and may not be corrected upon review.

## 2019-03-22 NOTE — Patient Instructions (Signed)
[facility] Discharge Instructions for Patients Receiving Chemotherapy  Today you received the following chemotherapy agents: Imfinzi.  To help prevent nausea and vomiting after your treatment, we encourage you to take your nausea medication as directed.   If you develop nausea and vomiting that is not controlled by your nausea medication, call the clinic.   BELOW ARE SYMPTOMS THAT SHOULD BE REPORTED IMMEDIATELY:  *FEVER GREATER THAN 100.5 F  *CHILLS WITH OR WITHOUT FEVER  NAUSEA AND VOMITING THAT IS NOT CONTROLLED WITH YOUR NAUSEA MEDICATION  *UNUSUAL SHORTNESS OF BREATH  *UNUSUAL BRUISING OR BLEEDING  TENDERNESS IN MOUTH AND THROAT WITH OR WITHOUT PRESENCE OF ULCERS  *URINARY PROBLEMS  *BOWEL PROBLEMS  UNUSUAL RASH Items with * indicate a potential emergency and should be followed up as soon as possible.  Feel free to call the clinic should you have any questions or concerns. The clinic phone number is [phone].  Please show the CHEMO ALERT CARD at check-in to the Emergency Department and triage nurse.   

## 2019-03-23 ENCOUNTER — Telehealth: Payer: Self-pay | Admitting: Internal Medicine

## 2019-03-23 NOTE — Telephone Encounter (Signed)
Scheduled per los. Called and left msg. Mailed printout  °

## 2019-04-04 NOTE — Progress Notes (Signed)
[facility] OFFICE PROGRESS NOTE  Joe Frees, MD [place] [id number]  DIAGNOSIS: Stage IIIA (T3, N1, M0) non-small cell lung cancer, squamous cell carcinoma diagnosed in July 2020 and presented with right middle lobe mass and right hilar adenopathy with postobstructive pneumonia and suspicious right pleural effusion.  PRIOR THERAPY: Weekly concurrent chemoradiation with Carboplatin for an AUC of 2 and paclitaxel 45 mg/m2. First dose 11/23/2018. Status post 7 cycles.  CURRENT THERAPY: Consolidation immunotherapy with Imfinzi 10 mg/KG every 2 weeks.  First dose February 08, 2019.  Status post 4 cycles  INTERVAL HISTORY: Joe Cole 68 y.o. male returns to the clinic for a follow up visit. The patient is feeling well today without any concerning complaints. The patient continues to tolerate treatment with Imfinzi well without any adverse side effects. Denies any fever, chills, night sweats, or weight loss. Denies any chest pain, cough, or hemoptysis. He reports his usual unchanged shortness of breath with exertion. Denies any nausea, vomiting, diarrhea, or constipation. Denies any headache or visual changes. Denies any rashes or skin changes. The patient is here today for evaluation prior to starting cycle # 5  MEDICAL HISTORY: Past Medical History:  Diagnosis Date  . Arthritis   . Atrial fibrillation ([place]) 10/2018  . COPD with emphysema ([place])   . Dyspnea   . Hypertension   . lung ca dx'd 10/2018    ALLERGIES:  has No Known Allergies.  MEDICATIONS:  Current Outpatient Medications  Medication Sig Dispense Refill  . acetaminophen (TYLENOL) 500 MG tablet Take 1,000 mg by mouth every 6 (six) hours as needed for moderate pain or headache.    . albuterol (PROAIR HFA) 108 (90 Base) MCG/ACT inhaler Inhale 2 puffs into the lungs every 6 (six) hours as needed for wheezing or shortness of breath. 1 Inhaler 5  . apixaban (ELIQUIS) 5 MG TABS  tablet Take 1 tablet (5 mg total) by mouth 2 (two) times daily. 60 tablet 9  . atorvastatin (LIPITOR) 20 MG tablet Take 20 mg by mouth daily.    Marland Kitchen diltiazem (CARDIZEM CD) 360 MG 24 hr capsule Take 1 capsule (360 mg total) by mouth daily. 30 capsule 3  . metoprolol succinate (TOPROL XL) 25 MG 24 hr tablet Take 1 tablet (25 mg total) by mouth at bedtime. 30 tablet 11  . metoprolol succinate (TOPROL XL) 50 MG 24 hr tablet Take 1 tablet (50 mg total) by mouth at bedtime. Take with or immediately following a meal. 30 tablet 11  . Probiotic Product (ALIGN) 4 MG CAPS Take 4 mg by mouth daily.    . prochlorperazine (COMPAZINE) 10 MG tablet Take 1 tablet (10 mg total) by mouth every 6 (six) hours as needed for nausea or vomiting. 30 tablet 0  . traZODone (DESYREL) 50 MG tablet TAKE 1 TABLET BY MOUTH EVERYDAY AT BEDTIME 90 tablet 1  . umeclidinium-vilanterol (ANORO ELLIPTA) 62.5-25 MCG/INH AEPB Inhale 1 puff into the lungs daily. 1 each 6   No current facility-administered medications for this visit.     SURGICAL HISTORY:  Past Surgical History:  Procedure Laterality Date  . CARDIOVERSION N/A 01/11/2019   Procedure: CARDIOVERSION;  Surgeon: Josue Hector, MD;  Location: [facility] ENDOSCOPY;  Service: Cardiovascular;  Laterality: N/A;  . KNEE ARTHROSCOPY     BIL   . TOE SURGERY     PINNED LEFT BIG TOE  . TONSILLECTOMY     T+A  AS CHILD  . TOTAL  KNEE ARTHROPLASTY Left 08/19/2017   Procedure: TOTAL KNEE ARTHROPLASTY;  Surgeon: Renette Butters, MD;  Location: [place];  Service: Orthopedics;  Laterality: Left;  Marland Kitchen VIDEO BRONCHOSCOPY Bilateral 11/05/2018   Procedure: VIDEO BRONCHOSCOPY WITH FLUORO;  Surgeon: [name], MD;  Location: [facility] ENDOSCOPY;  Service: Cardiopulmonary;  Laterality: Bilateral;  . VIDEO BRONCHOSCOPY Bilateral 11/09/2018   Procedure: VIDEO BRONCHOSCOPY WITH FLUORO;  Surgeon: Rigoberto Noel, MD;  Location: [place];  Service: Cardiopulmonary;  Laterality: Bilateral;    REVIEW OF  SYSTEMS:   Review of Systems  Constitutional: Negative for appetite change, chills, fatigue, fever and unexpected weight change.  HENT: Negative for mouth sores, nosebleeds, sore throat and trouble swallowing.   Eyes: Negative for eye problems and icterus.  Respiratory: Positive for baseline shortness of breath. Negative for cough, hemoptysis, and wheezing.   Cardiovascular: Negative for chest pain and leg swelling.  Gastrointestinal: Negative for abdominal pain, constipation, diarrhea, nausea and vomiting.  Genitourinary: Negative for bladder incontinence, difficulty urinating, dysuria, frequency and hematuria.   Musculoskeletal: Negative for back pain, gait problem, neck pain and neck stiffness.  Skin: Negative for itching and rash.  Neurological: Negative for dizziness, extremity weakness, gait problem, headaches, light-headedness and seizures.  Hematological: Negative for adenopathy. Does not bruise/bleed easily.  Psychiatric/Behavioral: Negative for confusion, depression and sleep disturbance. The patient is not nervous/anxious.     PHYSICAL EXAMINATION:  Blood pressure 126/87, pulse (!) 55, temperature 98.3 F (36.8 C), temperature source Oral, resp. rate 17, height 6\' 1"  (1.854 m), weight 187 lb (84.8 kg), SpO2 95 %.  ECOG PERFORMANCE STATUS: 1 - Symptomatic but completely ambulatory  Physical Exam  Constitutional: Oriented to person, place, and time and well-developed, well-nourished, and in no distress.  HENT:  Head: Normocephalic and atraumatic.  Mouth/Throat: Oropharynx is clear and moist. No oropharyngeal exudate.  Eyes: Conjunctivae are normal. Right eye exhibits no discharge. Left eye exhibits no discharge. No scleral icterus.  Neck: Normal range of motion. Neck supple.  Cardiovascular: Normal rate, irregular rhythm, normal heart sounds and intact distal pulses.   Pulmonary/Chest: Effort normal and breath sounds normal. No respiratory distress. No wheezes. No rales.   Abdominal: Soft. Bowel sounds are normal. Exhibits no distension and no mass. There is no tenderness.  Musculoskeletal: Bilateral lower extremity swelling. Normal range of motion.   Lymphadenopathy:    No cervical adenopathy.  Neurological: Alert and oriented to person, place, and time. Exhibits normal muscle tone. Gait normal. Coordination normal.  Skin: Skin is warm and dry. No rash noted. Not diaphoretic. No erythema. No pallor.  Psychiatric: Mood, memory and judgment normal.  Vitals reviewed.  LABORATORY DATA: Lab Results  Component Value Date   WBC 5.1 04/05/2019   HGB 14.2 04/05/2019   HCT 39.9 04/05/2019   MCV 111.5 (H) 04/05/2019   PLT 217 04/05/2019      Chemistry      Component Value Date/Time   NA 136 04/05/2019 0833   K 4.9 04/05/2019 0833   CL 104 04/05/2019 0833   CO2 23 04/05/2019 0833   BUN 9 04/05/2019 0833   CREATININE 0.76 04/05/2019 0833      Component Value Date/Time   CALCIUM 8.9 04/05/2019 0833   ALKPHOS 98 04/05/2019 0833   AST 16 04/05/2019 0833   ALT 13 04/05/2019 0833   BILITOT 0.4 04/05/2019 0833       RADIOGRAPHIC STUDIES:  No results found.   ASSESSMENT/PLAN:  This is a very pleasant 68 year old Caucasian male diagnosed with  stage IIIa (T3,N1,M0) non-small cell lung cancer, squamous cell carcinoma.He presented with a right middle lobe lung mass and right hilar adenopathy with postobstructive pneumonia and a suspicious right pleural effusion. He was diagnosed in July 2020.  The patient completed 5 cycles of weekly concurrent chemoradiation with carboplatin for an AUC of 2 and paclitaxel 45 mgm/2.   He is currently undergoing consolidation immunotherapy with Imfinzi 10 mg/kg IV every 2 weeks. He is status post 4 cycles and he is tolerating it well without any adverse side effects.   Labs were reviewed. I recommend that he proceed with cycle 5 today as scheduled. We will see him back for a follow up visit in 2 weeks for  evaluation before starting cycle #6.   The patient was advised to call immediately if he has any concerning symptoms in the interval. The patient voices understanding of current disease status and treatment options and is in agreement with the current care plan. All questions were answered. The patient knows to call the clinic with any problems, questions or concerns. We can certainly see the patient much sooner if necessary  No orders of the defined types were placed in this encounter.    Michaelpaul Apo L Joran Kallal, PA-C 04/05/19

## 2019-04-05 ENCOUNTER — Inpatient Hospital Stay: Payer: Medicare HMO

## 2019-04-05 ENCOUNTER — Inpatient Hospital Stay: Payer: Medicare HMO | Attending: Internal Medicine | Admitting: Physician Assistant

## 2019-04-05 ENCOUNTER — Other Ambulatory Visit: Payer: Self-pay

## 2019-04-05 VITALS — BP 126/87 | HR 55 | Temp 98.3°F | Resp 17 | Ht 73.0 in | Wt 187.0 lb

## 2019-04-05 DIAGNOSIS — C3491 Malignant neoplasm of unspecified part of right bronchus or lung: Secondary | ICD-10-CM

## 2019-04-05 DIAGNOSIS — C3431 Malignant neoplasm of lower lobe, right bronchus or lung: Secondary | ICD-10-CM

## 2019-04-05 DIAGNOSIS — Z79899 Other long term (current) drug therapy: Secondary | ICD-10-CM | POA: Diagnosis not present

## 2019-04-05 DIAGNOSIS — C342 Malignant neoplasm of middle lobe, bronchus or lung: Secondary | ICD-10-CM | POA: Diagnosis present

## 2019-04-05 DIAGNOSIS — Z5112 Encounter for antineoplastic immunotherapy: Secondary | ICD-10-CM | POA: Diagnosis not present

## 2019-04-05 LAB — CBC WITH DIFFERENTIAL (CANCER CENTER ONLY)
Abs Immature Granulocytes: 0.02 10*3/uL (ref 0.00–0.07)
Basophils Absolute: 0.1 10*3/uL (ref 0.0–0.1)
Basophils Relative: 1 %
Eosinophils Absolute: 0.1 10*3/uL (ref 0.0–0.5)
Eosinophils Relative: 2 %
HCT: 39.9 % (ref 39.0–52.0)
Hemoglobin: 14.2 g/dL (ref 13.0–17.0)
Immature Granulocytes: 0 %
Lymphocytes Relative: 21 %
Lymphs Abs: 1.1 10*3/uL (ref 0.7–4.0)
MCH: 39.7 pg — ABNORMAL HIGH (ref 26.0–34.0)
MCHC: 35.6 g/dL (ref 30.0–36.0)
MCV: 111.5 fL — ABNORMAL HIGH (ref 80.0–100.0)
Monocytes Absolute: 0.7 10*3/uL (ref 0.1–1.0)
Monocytes Relative: 14 %
Neutro Abs: 3.1 10*3/uL (ref 1.7–7.7)
Neutrophils Relative %: 62 %
Platelet Count: 217 10*3/uL (ref 150–400)
RBC: 3.58 MIL/uL — ABNORMAL LOW (ref 4.22–5.81)
RDW: 11.8 % (ref 11.5–15.5)
WBC Count: 5.1 10*3/uL (ref 4.0–10.5)
nRBC: 0 % (ref 0.0–0.2)

## 2019-04-05 LAB — CMP (CANCER CENTER ONLY)
ALT: 13 U/L (ref 0–44)
AST: 16 U/L (ref 15–41)
Albumin: 3.2 g/dL — ABNORMAL LOW (ref 3.5–5.0)
Alkaline Phosphatase: 98 U/L (ref 38–126)
Anion gap: 9 (ref 5–15)
BUN: 9 mg/dL (ref 8–23)
CO2: 23 mmol/L (ref 22–32)
Calcium: 8.9 mg/dL (ref 8.9–10.3)
Chloride: 104 mmol/L (ref 98–111)
Creatinine: 0.76 mg/dL (ref 0.61–1.24)
GFR, Est AFR Am: >60 mL/min (ref >60)
GFR, Estimated: >60 mL/min (ref >60)
Glucose, Bld: 105 mg/dL — ABNORMAL HIGH (ref 70–99)
Potassium: 4.9 mmol/L (ref 3.5–5.1)
Sodium: 136 mmol/L (ref 135–145)
Total Bilirubin: 0.4 mg/dL (ref 0.3–1.2)
Total Protein: 6.6 g/dL (ref 6.5–8.1)

## 2019-04-05 LAB — TSH: TSH: 2.498 u[IU]/mL (ref 0.320–4.118)

## 2019-04-05 MED ORDER — SODIUM CHLORIDE 0.9 % IV SOLN
10.2000 mg/kg | Freq: Once | INTRAVENOUS | Status: AC
  Administered 2019-04-05: 860 mg via INTRAVENOUS
  Filled 2019-04-05: qty 10

## 2019-04-05 MED ORDER — SODIUM CHLORIDE 0.9 % IV SOLN
Freq: Once | INTRAVENOUS | Status: AC
  Administered 2019-04-05: 10:00:00 via INTRAVENOUS
  Filled 2019-04-05: qty 250

## 2019-04-05 NOTE — Patient Instructions (Signed)
[facility] Discharge Instructions for Patients Receiving Chemotherapy  Today you received the following chemotherapy agents: Imfinzi.  To help prevent nausea and vomiting after your treatment, we encourage you to take your nausea medication as directed.   If you develop nausea and vomiting that is not controlled by your nausea medication, call the clinic.   BELOW ARE SYMPTOMS THAT SHOULD BE REPORTED IMMEDIATELY:  *FEVER GREATER THAN 100.5 F  *CHILLS WITH OR WITHOUT FEVER  NAUSEA AND VOMITING THAT IS NOT CONTROLLED WITH YOUR NAUSEA MEDICATION  *UNUSUAL SHORTNESS OF BREATH  *UNUSUAL BRUISING OR BLEEDING  TENDERNESS IN MOUTH AND THROAT WITH OR WITHOUT PRESENCE OF ULCERS  *URINARY PROBLEMS  *BOWEL PROBLEMS  UNUSUAL RASH Items with * indicate a potential emergency and should be followed up as soon as possible.  Feel free to call the clinic should you have any questions or concerns. The clinic phone number is [phone].  Please show the CHEMO ALERT CARD at check-in to the Emergency Department and triage nurse.   

## 2019-04-06 ENCOUNTER — Telehealth: Payer: Self-pay | Admitting: Physician Assistant

## 2019-04-06 NOTE — Telephone Encounter (Signed)
Per 12/7 los appts already scheduled.

## 2019-04-07 ENCOUNTER — Encounter: Payer: Self-pay | Admitting: *Deleted

## 2019-04-19 ENCOUNTER — Inpatient Hospital Stay (HOSPITAL_BASED_OUTPATIENT_CLINIC_OR_DEPARTMENT_OTHER): Payer: Medicare HMO | Admitting: Internal Medicine

## 2019-04-19 ENCOUNTER — Encounter: Payer: Self-pay | Admitting: Internal Medicine

## 2019-04-19 ENCOUNTER — Other Ambulatory Visit: Payer: Self-pay

## 2019-04-19 ENCOUNTER — Inpatient Hospital Stay: Payer: Medicare HMO

## 2019-04-19 VITALS — BP 148/94 | HR 68 | Temp 98.0°F | Resp 17 | Ht 73.0 in | Wt 182.5 lb

## 2019-04-19 DIAGNOSIS — I10 Essential (primary) hypertension: Secondary | ICD-10-CM

## 2019-04-19 DIAGNOSIS — C3491 Malignant neoplasm of unspecified part of right bronchus or lung: Secondary | ICD-10-CM

## 2019-04-19 DIAGNOSIS — Z79899 Other long term (current) drug therapy: Secondary | ICD-10-CM | POA: Diagnosis not present

## 2019-04-19 DIAGNOSIS — Z5112 Encounter for antineoplastic immunotherapy: Secondary | ICD-10-CM | POA: Diagnosis not present

## 2019-04-19 DIAGNOSIS — C3431 Malignant neoplasm of lower lobe, right bronchus or lung: Secondary | ICD-10-CM

## 2019-04-19 DIAGNOSIS — C3490 Malignant neoplasm of unspecified part of unspecified bronchus or lung: Secondary | ICD-10-CM

## 2019-04-19 DIAGNOSIS — C342 Malignant neoplasm of middle lobe, bronchus or lung: Secondary | ICD-10-CM | POA: Diagnosis not present

## 2019-04-19 LAB — CBC WITH DIFFERENTIAL (CANCER CENTER ONLY)
Abs Immature Granulocytes: 0.02 10*3/uL (ref 0.00–0.07)
Basophils Absolute: 0.1 10*3/uL (ref 0.0–0.1)
Basophils Relative: 1 %
Eosinophils Absolute: 0.1 10*3/uL (ref 0.0–0.5)
Eosinophils Relative: 1 %
HCT: 45.1 % (ref 39.0–52.0)
Hemoglobin: 15.8 g/dL (ref 13.0–17.0)
Immature Granulocytes: 0 %
Lymphocytes Relative: 23 %
Lymphs Abs: 1.3 10*3/uL (ref 0.7–4.0)
MCH: 40.4 pg — ABNORMAL HIGH (ref 26.0–34.0)
MCHC: 35 g/dL (ref 30.0–36.0)
MCV: 115.3 fL — ABNORMAL HIGH (ref 80.0–100.0)
Monocytes Absolute: 0.6 10*3/uL (ref 0.1–1.0)
Monocytes Relative: 12 %
Neutro Abs: 3.4 10*3/uL (ref 1.7–7.7)
Neutrophils Relative %: 63 %
Platelet Count: 197 10*3/uL (ref 150–400)
RBC: 3.91 MIL/uL — ABNORMAL LOW (ref 4.22–5.81)
RDW: 12.3 % (ref 11.5–15.5)
WBC Count: 5.5 10*3/uL (ref 4.0–10.5)
nRBC: 0 % (ref 0.0–0.2)

## 2019-04-19 LAB — CMP (CANCER CENTER ONLY)
ALT: 16 U/L (ref 0–44)
AST: 21 U/L (ref 15–41)
Albumin: 3.4 g/dL — ABNORMAL LOW (ref 3.5–5.0)
Alkaline Phosphatase: 89 U/L (ref 38–126)
Anion gap: 14 (ref 5–15)
BUN: 9 mg/dL (ref 8–23)
CO2: 19 mmol/L — ABNORMAL LOW (ref 22–32)
Calcium: 9.2 mg/dL (ref 8.9–10.3)
Chloride: 103 mmol/L (ref 98–111)
Creatinine: 0.77 mg/dL (ref 0.61–1.24)
GFR, Est AFR Am: >60 mL/min (ref >60)
GFR, Estimated: >60 mL/min (ref >60)
Glucose, Bld: 93 mg/dL (ref 70–99)
Potassium: 4.9 mmol/L (ref 3.5–5.1)
Sodium: 136 mmol/L (ref 135–145)
Total Bilirubin: 0.4 mg/dL (ref 0.3–1.2)
Total Protein: 7.1 g/dL (ref 6.5–8.1)

## 2019-04-19 MED ORDER — SODIUM CHLORIDE 0.9 % IV SOLN
10.2000 mg/kg | Freq: Once | INTRAVENOUS | Status: AC
  Administered 2019-04-19: 860 mg via INTRAVENOUS
  Filled 2019-04-19: qty 10

## 2019-04-19 MED ORDER — SODIUM CHLORIDE 0.9 % IV SOLN
Freq: Once | INTRAVENOUS | Status: AC
  Filled 2019-04-19: qty 250

## 2019-04-19 NOTE — Patient Instructions (Signed)
Durvalumab injection What is this medicine? DURVALUMAB (dur VAL ue mab) is a monoclonal antibody. It is used to treat urothelial cancer and lung cancer. This medicine may be used for other purposes; ask your health care provider or pharmacist if you have questions. COMMON BRAND NAME(S): IMFINZI What should I tell my health care provider before I take this medicine? They need to know if you have any of these conditions:  diabetes  immune system problems  infection  inflammatory bowel disease  kidney disease  liver disease  lung or breathing disease  lupus  organ transplant  stomach or intestine problems  thyroid disease  an unusual or allergic reaction to durvalumab, other medicines, foods, dyes, or preservatives  pregnant or trying to get pregnant  breast-feeding How should I use this medicine? This medicine is for infusion into a vein. It is given by a health care professional in a hospital or clinic setting. A special MedGuide will be given to you before each treatment. Be sure to read this information carefully each time. Talk to your pediatrician regarding the use of this medicine in children. Special care may be needed. Overdosage: If you think you have taken too much of this medicine contact a poison control center or emergency room at once. NOTE: This medicine is only for you. Do not share this medicine with others. What if I miss a dose? It is important not to miss your dose. Call your doctor or health care professional if you are unable to keep an appointment. What may interact with this medicine? Interactions have not been studied. This list may not describe all possible interactions. Give your health care provider a list of all the medicines, herbs, non-prescription drugs, or dietary supplements you use. Also tell them if you smoke, drink alcohol, or use illegal drugs. Some items may interact with your medicine. What should I watch for while using this  medicine? This drug may make you feel generally unwell. Continue your course of treatment even though you feel ill unless your doctor tells you to stop. You may need blood work done while you are taking this medicine. Do not become pregnant while taking this medicine or for 3 months after stopping it. Women should inform their doctor if they wish to become pregnant or think they might be pregnant. There is a potential for serious side effects to an unborn child. Talk to your health care professional or pharmacist for more information. Do not breast-feed an infant while taking this medicine or for 3 months after stopping it. What side effects may I notice from receiving this medicine? Side effects that you should report to your doctor or health care professional as soon as possible:  allergic reactions like skin rash, itching or hives, swelling of the face, lips, or tongue  black, tarry stools  bloody or watery diarrhea  breathing problems  change in emotions or moods  change in sex drive  changes in vision  chest pain or chest tightness  chills  confusion  cough  facial flushing  fever  headache  signs and symptoms of high blood sugar such as dizziness; dry mouth; dry skin; fruity breath; nausea; stomach pain; increased hunger or thirst; increased urination  signs and symptoms of liver injury like dark yellow or brown urine; general ill feeling or flu-like symptoms; light-colored stools; loss of appetite; nausea; right upper belly pain; unusually weak or tired; yellowing of the eyes or skin  stomach pain  trouble passing urine or change in   the amount of urine  weight gain or weight loss Side effects that usually do not require medical attention (report these to your doctor or health care professional if they continue or are bothersome):  bone pain  constipation  loss of appetite  muscle pain  nausea  swelling of the ankles, feet, hands  tiredness This list  may not describe all possible side effects. Call your doctor for medical advice about side effects. You may report side effects to FDA at 1-800-FDA-1088. Where should I keep my medicine? This drug is given in a hospital or clinic and will not be stored at home. NOTE: This sheet is a summary. It may not cover all possible information. If you have questions about this medicine, talk to your doctor, pharmacist, or health care provider.  2020 Elsevier/Gold Standard (2016-06-25 19:25:04)  Coronavirus (COVID-19) Are you at risk?  Are you at risk for the Coronavirus (COVID-19)?  To be considered HIGH RISK for Coronavirus (COVID-19), you have to meet the following criteria:  . Traveled to [place], [place], [place], [place] or [place]; or in the [place] to [place], [place], [place], or [place]; and have fever, cough, and shortness of breath within the last 2 weeks of travel OR . Been in close contact with a person diagnosed with COVID-19 within the last 2 weeks and have fever, cough, and shortness of breath . IF YOU DO NOT MEET THESE CRITERIA, YOU ARE CONSIDERED LOW RISK FOR COVID-19.  What to do if you are HIGH RISK for COVID-19?  . If you are having a medical emergency, call 911. . Seek medical care right away. Before you go to a doctor's office, urgent care or emergency department, call ahead and tell them about your recent travel, contact with someone diagnosed with COVID-19, and your symptoms. You should receive instructions from your physician's office regarding next steps of care.  . When you arrive at healthcare provider, tell the healthcare staff immediately you have returned from visiting [place], [place], [place], [place] or [place]; or traveled in the [place] to [place], [place], [place], or [place]; in the last two weeks or you have been in close contact with a person diagnosed with COVID-19 in the last 2 weeks.   . Tell the health care staff about your symptoms:  fever, cough and shortness of breath. . After you have been seen by a medical provider, you will be either: o Tested for (COVID-19) and discharged home on quarantine except to seek medical care if symptoms worsen, and asked to  - Stay home and avoid contact with others until you get your results (4-5 days)  - Avoid travel on public transportation if possible (such as bus, train, or airplane) or o Sent to the Emergency Department by EMS for evaluation, COVID-19 testing, and possible admission depending on your condition and test results.  What to do if you are LOW RISK for COVID-19?  Reduce your risk of any infection by using the same precautions used for avoiding the common cold or flu:  . Wash your hands often with soap and warm water for at least 20 seconds.  If soap and water are not readily available, use an alcohol-based hand sanitizer with at least 60% alcohol.  . If coughing or sneezing, cover your mouth and nose by coughing or sneezing into the elbow areas of your shirt or coat, into a tissue or into your sleeve (not your hands). . Avoid shaking hands with others   and consider head nods or verbal greetings only. . Avoid touching your eyes, nose, or mouth with unwashed hands.  . Avoid close contact with people who are sick. . Avoid places or events with large numbers of people in one location, like concerts or sporting events. . Carefully consider travel plans you have or are making. . If you are planning any travel outside or inside the [place], visit the CDC's Travelers' Health webpage for the latest health notices. . If you have some symptoms but not all symptoms, continue to monitor at home and seek medical attention if your symptoms worsen. . If you are having a medical emergency, call 911.   ADDITIONAL HEALTHCARE OPTIONS FOR PATIENTS  [facility] / e-Visit: [email or web address]         [facility]: [phone]  [facility]: [phone]                   [facility]: [phone]   

## 2019-04-19 NOTE — Progress Notes (Signed)
[facility] Telephone:([phone]) [phone]   Fax:(336) [phone]  OFFICE PROGRESS NOTE  [name], MD [place] [address]  DIAGNOSIS: Stage IIIA (T3, N1, M0) non-small cell lung cancer, squamous cell carcinoma diagnosed in July 2020 and presented with right middle lobe mass and right hilar adenopathy with postobstructive pneumonia and suspicious right pleural effusion.  PRIOR THERAPY:  Weekly concurrent chemoradiation with Carboplatin for an AUC of 2 and paclitaxel 45 mg/m2. First dose 11/23/2018. Status post 7 cycles.   CURRENT THERAPY: Consolidation immunotherapy with Imfinzi 10 mg/KG every 2 weeks.  First dose February 08, 2019.  Status post 5 cycles.  INTERVAL HISTORY: Joe Cole 68 y.o. male returns to the clinic today for follow-up visit.  The patient has no complaints today except for mild fatigue.  He has been tolerating his treatment with consolidation Imfinzi fairly well.  He denied having any current chest pain, shortness of breath, cough or hemoptysis.  He denied having any fever or chills.  He has no nausea, vomiting, diarrhea or constipation.  He has no headache or visual changes.  He is here today for evaluation before starting cycle #6 of his treatment.  MEDICAL HISTORY: Past Medical History:  Diagnosis Date  . Arthritis   . Atrial fibrillation ([place]) 10/2018  . COPD with emphysema ([place])   . Dyspnea   . Hypertension   . lung ca dx'd 10/2018    ALLERGIES:  has No Known Allergies.  MEDICATIONS:  Current Outpatient Medications  Medication Sig Dispense Refill  . acetaminophen (TYLENOL) 500 MG tablet Take 1,000 mg by mouth every 6 (six) hours as needed for moderate pain or headache.    . albuterol (PROAIR HFA) 108 (90 Base) MCG/ACT inhaler Inhale 2 puffs into the lungs every 6 (six) hours as needed for wheezing or shortness of breath. 1 Inhaler 5  . apixaban (ELIQUIS) 5 MG TABS tablet Take 1 tablet (5 mg total) by  mouth 2 (two) times daily. 60 tablet 9  . atorvastatin (LIPITOR) 20 MG tablet Take 20 mg by mouth daily.    Marland Kitchen diltiazem (CARDIZEM CD) 360 MG 24 hr capsule Take 1 capsule (360 mg total) by mouth daily. 30 capsule 3  . metoprolol succinate (TOPROL XL) 25 MG 24 hr tablet Take 1 tablet (25 mg total) by mouth at bedtime. 30 tablet 11  . metoprolol succinate (TOPROL XL) 50 MG 24 hr tablet Take 1 tablet (50 mg total) by mouth at bedtime. Take with or immediately following a meal. 30 tablet 11  . Probiotic Product (ALIGN) 4 MG CAPS Take 4 mg by mouth daily.    . prochlorperazine (COMPAZINE) 10 MG tablet Take 1 tablet (10 mg total) by mouth every 6 (six) hours as needed for nausea or vomiting. 30 tablet 0  . traZODone (DESYREL) 50 MG tablet TAKE 1 TABLET BY MOUTH EVERYDAY AT BEDTIME 90 tablet 1  . umeclidinium-vilanterol (ANORO ELLIPTA) 62.5-25 MCG/INH AEPB Inhale 1 puff into the lungs daily. 1 each 6   No current facility-administered medications for this visit.    SURGICAL HISTORY:  Past Surgical History:  Procedure Laterality Date  . CARDIOVERSION N/A 01/11/2019   Procedure: CARDIOVERSION;  Surgeon: Josue Hector, MD;  Location: [facility] ENDOSCOPY;  Service: Cardiovascular;  Laterality: N/A;  . KNEE ARTHROSCOPY     BIL   . TOE SURGERY     PINNED LEFT BIG TOE  . TONSILLECTOMY     T+A  AS CHILD  . TOTAL KNEE ARTHROPLASTY Left 08/19/2017   Procedure: TOTAL KNEE ARTHROPLASTY;  Surgeon: Renette Butters, MD;  Location: [facility];  Service: Orthopedics;  Laterality: Left;  Marland Kitchen VIDEO BRONCHOSCOPY Bilateral 11/05/2018   Procedure: VIDEO BRONCHOSCOPY WITH FLUORO;  Surgeon: [name], MD;  Location[facility] END[facility]COPY;  Service: Cardiopulmonary;  Laterality: Bilateral;  . VIDEO BRONCHOSCOPY Bilateral 11/09/2018   Procedure: VIDEO BRONCHOSCOPY WITH FLUORO;  Surgeon: Rigoberto Noel, MD;  Location: [facility];  Service: Cardiopulmonary;  Laterality: Bilateral;    REVIEW OF SYSTEMS:  A comprehensive review of systems  was negative except for: Constitutional: positive for fatigue   PHYSICAL EXAMINATION: General appearance: alert, cooperative and no distress Head: Normocephalic, without obvious abnormality, atraumatic Neck: no adenopathy, no JVD, supple, symmetrical, trachea midline and thyroid not enlarged, symmetric, no tenderness/mass/nodules Lymph nodes: Cervical, supraclavicular, and axillary nodes normal. Resp: clear to auscultation bilaterally Back: symmetric, no curvature. ROM normal. No CVA tenderness. Cardio: regular rate and rhythm, S1, S2 normal, no murmur, click, rub or gallop GI: soft, non-tender; bowel sounds normal; no masses,  no organomegaly Extremities: extremities normal, atraumatic, no cyanosis or edema  ECOG PERFORMANCE STATUS: 1 - Symptomatic but completely ambulatory  Blood pressure (!) 148/94, pulse 68, temperature 98 F (36.7 C), temperature source Temporal, resp. rate 17, height 6\' 1"  (1.854 m), weight 182 lb 8 oz (82.8 kg), SpO2 100 %.  LABORATORY DATA: Lab Results  Component Value Date   WBC 5.5 04/19/2019   HGB 15.8 04/19/2019   HCT 45.1 04/19/2019   MCV 115.3 (H) 04/19/2019   PLT 197 04/19/2019      Chemistry      Component Value Date/Time   NA 136 04/19/2019 0859   K 4.9 04/19/2019 0859   CL 103 04/19/2019 0859   CO2 19 (L) 04/19/2019 0859   BUN 9 04/19/2019 0859   CREATININE 0.77 04/19/2019 0859      Component Value Date/Time   CALCIUM 9.2 04/19/2019 0859   ALKPHOS 89 04/19/2019 0859   AST 21 04/19/2019 0859   ALT 16 04/19/2019 0859   BILITOT 0.4 04/19/2019 0859       RADIOGRAPHIC STUDIES: No results found.  ASSESSMENT AND PLAN: This is a very pleasant 68 years old white male with stage IIIA non-small cell lung cancer, squamous cell carcinoma.  The patient underwent a course of concurrent chemoradiation with weekly carboplatin and paclitaxel status post 7 cycles.  The patient tolerated his treatment well except for mild fatigue and odynophagia. He  is currently undergoing consolidation treatment with immunotherapy with Imfinzi status post 5 cycles.   The patient continues to tolerate his treatment well with no concerning adverse effects. I recommended for him to proceed with cycle #6 today as planned. I will see him back for follow-up visit in 2 weeks for evaluation after repeating CT scan of the chest for restaging of his disease. For the atrial fibrillation, he will continue with Eliquis. The patient was advised to call immediately if he has any concerning symptoms in the interval. The patient voices understanding of current disease status and treatment options and is in agreement with the current care plan. All questions were answered. The patient knows to call the clinic with any problems, questions or concerns. We can certainly see the patient much sooner if necessary.  Disclaimer: This note was dictated with voice recognition software. Similar sounding words can inadvertently be transcribed and may not be corrected upon review.

## 2019-04-20 ENCOUNTER — Telehealth: Payer: Self-pay | Admitting: Internal Medicine

## 2019-04-20 NOTE — Telephone Encounter (Signed)
Scheduled per los called and left msg mailed printout

## 2019-04-22 ENCOUNTER — Encounter: Payer: Self-pay | Admitting: *Deleted

## 2019-04-29 ENCOUNTER — Ambulatory Visit (HOSPITAL_COMMUNITY): Payer: Medicare HMO

## 2019-04-29 ENCOUNTER — Ambulatory Visit (HOSPITAL_COMMUNITY)
Admission: RE | Admit: 2019-04-29 | Discharge: 2019-04-29 | Disposition: A | Discharge status: Home / Self Care | Payer: Medicare HMO | Source: Ambulatory Visit | Attending: Internal Medicine | Admitting: Internal Medicine

## 2019-04-29 ENCOUNTER — Other Ambulatory Visit: Payer: Self-pay

## 2019-04-29 DIAGNOSIS — C3490 Malignant neoplasm of unspecified part of unspecified bronchus or lung: Secondary | ICD-10-CM | POA: Diagnosis not present

## 2019-04-29 MED ORDER — IOHEXOL 300 MG/ML  SOLN
75.0000 mL | Freq: Once | INTRAMUSCULAR | Status: AC | PRN
  Administered 2019-04-29: 75 mL via INTRAVENOUS

## 2019-04-29 MED ORDER — SODIUM CHLORIDE (PF) 0.9 % IJ SOLN
INTRAMUSCULAR | Status: AC
  Filled 2019-04-29: qty 50

## 2019-05-03 ENCOUNTER — Other Ambulatory Visit: Payer: Self-pay

## 2019-05-03 ENCOUNTER — Inpatient Hospital Stay: Payer: Medicare HMO | Attending: Internal Medicine | Admitting: Internal Medicine

## 2019-05-03 ENCOUNTER — Inpatient Hospital Stay: Payer: Medicare HMO

## 2019-05-03 ENCOUNTER — Encounter: Payer: Self-pay | Admitting: Internal Medicine

## 2019-05-03 ENCOUNTER — Telehealth: Payer: Self-pay | Admitting: Internal Medicine

## 2019-05-03 VITALS — BP 128/113 | HR 69 | Temp 97.8°F | Resp 17 | Ht 73.0 in | Wt 183.4 lb

## 2019-05-03 VITALS — BP 134/91

## 2019-05-03 DIAGNOSIS — C3411 Malignant neoplasm of upper lobe, right bronchus or lung: Secondary | ICD-10-CM | POA: Diagnosis present

## 2019-05-03 DIAGNOSIS — I10 Essential (primary) hypertension: Secondary | ICD-10-CM | POA: Diagnosis not present

## 2019-05-03 DIAGNOSIS — Z5112 Encounter for antineoplastic immunotherapy: Secondary | ICD-10-CM

## 2019-05-03 DIAGNOSIS — Z79899 Other long term (current) drug therapy: Secondary | ICD-10-CM | POA: Insufficient documentation

## 2019-05-03 DIAGNOSIS — I4891 Unspecified atrial fibrillation: Secondary | ICD-10-CM

## 2019-05-03 DIAGNOSIS — C3431 Malignant neoplasm of lower lobe, right bronchus or lung: Secondary | ICD-10-CM

## 2019-05-03 DIAGNOSIS — C3491 Malignant neoplasm of unspecified part of right bronchus or lung: Secondary | ICD-10-CM

## 2019-05-03 LAB — CMP (CANCER CENTER ONLY)
ALT: 21 U/L (ref 0–44)
AST: 24 U/L (ref 15–41)
Albumin: 3.3 g/dL — ABNORMAL LOW (ref 3.5–5.0)
Alkaline Phosphatase: 92 U/L (ref 38–126)
Anion gap: 12 (ref 5–15)
BUN: 9 mg/dL (ref 8–23)
CO2: 20 mmol/L — ABNORMAL LOW (ref 22–32)
Calcium: 9 mg/dL (ref 8.9–10.3)
Chloride: 105 mmol/L (ref 98–111)
Creatinine: 0.81 mg/dL (ref 0.61–1.24)
GFR, Est AFR Am: >60 mL/min (ref >60)
GFR, Estimated: >60 mL/min (ref >60)
Glucose, Bld: 90 mg/dL (ref 70–99)
Potassium: 4.7 mmol/L (ref 3.5–5.1)
Sodium: 137 mmol/L (ref 135–145)
Total Bilirubin: 0.4 mg/dL (ref 0.3–1.2)
Total Protein: 7 g/dL (ref 6.5–8.1)

## 2019-05-03 LAB — CBC WITH DIFFERENTIAL (CANCER CENTER ONLY)
Abs Immature Granulocytes: 0.02 10*3/uL (ref 0.00–0.07)
Basophils Absolute: 0.1 10*3/uL (ref 0.0–0.1)
Basophils Relative: 1 %
Eosinophils Absolute: 0.1 10*3/uL (ref 0.0–0.5)
Eosinophils Relative: 1 %
HCT: 45 % (ref 39.0–52.0)
Hemoglobin: 16.1 g/dL (ref 13.0–17.0)
Immature Granulocytes: 0 %
Lymphocytes Relative: 20 %
Lymphs Abs: 1.2 10*3/uL (ref 0.7–4.0)
MCH: 39.3 pg — ABNORMAL HIGH (ref 26.0–34.0)
MCHC: 35.8 g/dL (ref 30.0–36.0)
MCV: 109.8 fL — ABNORMAL HIGH (ref 80.0–100.0)
Monocytes Absolute: 0.7 10*3/uL (ref 0.1–1.0)
Monocytes Relative: 12 %
Neutro Abs: 3.9 10*3/uL (ref 1.7–7.7)
Neutrophils Relative %: 66 %
Platelet Count: 198 10*3/uL (ref 150–400)
RBC: 4.1 MIL/uL — ABNORMAL LOW (ref 4.22–5.81)
RDW: 11.9 % (ref 11.5–15.5)
WBC Count: 6 10*3/uL (ref 4.0–10.5)
nRBC: 0 % (ref 0.0–0.2)

## 2019-05-03 LAB — TSH: TSH: 2.608 u[IU]/mL (ref 0.320–4.118)

## 2019-05-03 MED ORDER — SODIUM CHLORIDE 0.9 % IV SOLN
860.0000 mg | Freq: Once | INTRAVENOUS | Status: AC
  Administered 2019-05-03: 11:00:00 860 mg via INTRAVENOUS
  Filled 2019-05-03: qty 10

## 2019-05-03 MED ORDER — SODIUM CHLORIDE 0.9 % IV SOLN
Freq: Once | INTRAVENOUS | Status: AC
  Filled 2019-05-03: qty 250

## 2019-05-03 NOTE — Telephone Encounter (Signed)
Scheduled per los. Called and left msg. mailed printout  

## 2019-05-03 NOTE — Progress Notes (Signed)
[place] [phone]) [phone]   [phone]  OFFICE PROGRESS NOTE  [name], MD [place] [address]  DIAGNOSIS: Stage IIIA (T3, N1, M0) non-small cell lung cancer, squamous cell carcinoma diagnosed in July 2020 and presented with right middle lobe mass and right hilar adenopathy with postobstructive pneumonia and suspicious right pleural effusion.  PRIOR THERAPY:  Weekly concurrent chemoradiation with Carboplatin for an AUC of 2 and paclitaxel 45 mg/m2. First dose 11/23/2018. Status post 7 cycles.   CURRENT THERAPY: Consolidation immunotherapy with Imfinzi 10 mg/KG every 2 weeks.  First dose February 08, 2019.  Status post 6 cycles.  INTERVAL HISTORY: Joe Cole 69 y.o. male returns to the clinic today for follow-up visit.  His wife was available by phone during the visit.  The patient is feeling fine today with no concerning complaints except for dry cough.  He denied having any chest pain, shortness of breath or hemoptysis.  He denied having any recent weight loss or night sweats.  He has no nausea, vomiting, diarrhea or constipation.  He denied having any headache or visual changes.  He has been tolerating his treatment with Imfinzi fairly well.  His blood pressure is elevated today secondary to concern about his scan results.  The patient had repeat CT scan of the chest performed recently and he is here for evaluation and discussion of his scan results and treatment options.   MEDICAL HISTORY: Past Medical History:  Diagnosis Date  . Arthritis   . Atrial fibrillation ([place]) 10/2018  . COPD with emphysema ([place])   . Dyspnea   . Hypertension   . lung ca dx'd 10/2018    ALLERGIES:  has No Known Allergies.  MEDICATIONS:  Current Outpatient Medications  Medication Sig Dispense Refill  . acetaminophen (TYLENOL) 500 MG tablet Take 1,000 mg by mouth every 6 (six) hours as needed for moderate pain or headache.      . albuterol (PROAIR HFA) 108 (90 Base) MCG/ACT inhaler Inhale 2 puffs into the lungs every 6 (six) hours as needed for wheezing or shortness of breath. 1 Inhaler 5  . apixaban (ELIQUIS) 5 MG TABS tablet Take 1 tablet (5 mg total) by mouth 2 (two) times daily. 60 tablet 9  . atorvastatin (LIPITOR) 20 MG tablet Take 20 mg by mouth daily.    Marland Kitchen diltiazem (CARDIZEM CD) 360 MG 24 hr capsule Take 1 capsule (360 mg total) by mouth daily. 30 capsule 3  . metoprolol succinate (TOPROL XL) 25 MG 24 hr tablet Take 1 tablet (25 mg total) by mouth at bedtime. 30 tablet 11  . metoprolol succinate (TOPROL XL) 50 MG 24 hr tablet Take 1 tablet (50 mg total) by mouth at bedtime. Take with or immediately following a meal. 30 tablet 11  . Probiotic Product (ALIGN) 4 MG CAPS Take 4 mg by mouth daily.    . traZODone (DESYREL) 50 MG tablet TAKE 1 TABLET BY MOUTH EVERYDAY AT BEDTIME 90 tablet 1  . umeclidinium-vilanterol (ANORO ELLIPTA) 62.5-25 MCG/INH AEPB Inhale 1 puff into the lungs daily. 1 each 6  . prochlorperazine (COMPAZINE) 10 MG tablet Take 1 tablet (10 mg total) by mouth every 6 (six) hours as needed for nausea or vomiting. (Patient not taking: Reported on 05/03/2019) 30 tablet 0   No current facility-administered medications for this visit.    SURGICAL HISTORY:  Past Surgical History:  Procedure Laterality Date  . CARDIOVERSION N/A 01/11/2019  Procedure: CARDIOVERSION;  Surgeon: Josue Hector, MD;  Location: [facility] ENDOSCOPY;  Service: Cardiovascular;  Laterality: N/A;  . KNEE ARTHROSCOPY     BIL   . TOE SURGERY     PINNED LEFT BIG TOE  . TONSILLECTOMY     T+A  AS CHILD  . TOTAL KNEE ARTHROPLASTY Left 08/19/2017   Procedure: TOTAL KNEE ARTHROPLASTY;  Surgeon: Renette Butters, MD;  Location: [facility];  Service: Orthopedics;  Laterality: Left;  Marland Kitchen VIDEO BRONCHOSCOPY Bilateral 11/05/2018   Procedure: VIDEO BRONCHOSCOPY WITH FLUORO;  Surgeon: [name], MD;  Location: [facility] ENDOSCOPY;  Service: Cardiopulmonary;   Laterality: Bilateral;  . VIDEO BRONCHOSCOPY Bilateral 11/09/2018   Procedure: VIDEO BRONCHOSCOPY WITH FLUORO;  Surgeon: Rigoberto Noel, MD;  Location: [place];  Service: Cardiopulmonary;  Laterality: Bilateral;    REVIEW OF SYSTEMS:  Constitutional: negative Eyes: negative Ears, nose, mouth, throat, and face: negative Respiratory: positive for cough Cardiovascular: negative Gastrointestinal: negative Genitourinary:negative Integument/breast: negative Hematologic/lymphatic: negative Musculoskeletal:negative Neurological: negative Behavioral/Psych: negative Endocrine: negative Allergic/Immunologic: negative   PHYSICAL EXAMINATION: General appearance: alert, cooperative and no distress Head: Normocephalic, without obvious abnormality, atraumatic Neck: no adenopathy, no JVD, supple, symmetrical, trachea midline and thyroid not enlarged, symmetric, no tenderness/mass/nodules Lymph nodes: Cervical, supraclavicular, and axillary nodes normal. Resp: clear to auscultation bilaterally Back: symmetric, no curvature. ROM normal. No CVA tenderness. Cardio: regular rate and rhythm, S1, S2 normal, no murmur, click, rub or gallop GI: soft, non-tender; bowel sounds normal; no masses,  no organomegaly Extremities: extremities normal, atraumatic, no cyanosis or edema Neurologic: Alert and oriented X 3, normal strength and tone. Normal symmetric reflexes. Normal coordination and gait  ECOG PERFORMANCE STATUS: 1 - Symptomatic but completely ambulatory  Blood pressure (!) 128/113, pulse 69, temperature 97.8 F (36.6 C), temperature source Temporal, resp. rate 17, height 6\' 1"  (1.854 m), weight 183 lb 6.4 oz (83.2 kg), SpO2 99 %.  LABORATORY DATA: Lab Results  Component Value Date   WBC 6.0 05/03/2019   HGB 16.1 05/03/2019   HCT 45.0 05/03/2019   MCV 109.8 (H) 05/03/2019   PLT 198 05/03/2019      Chemistry      Component Value Date/Time   NA 136 04/19/2019 0859   K 4.9 04/19/2019 0859    CL 103 04/19/2019 0859   CO2 19 (L) 04/19/2019 0859   BUN 9 04/19/2019 0859   CREATININE 0.77 04/19/2019 0859      Component Value Date/Time   CALCIUM 9.2 04/19/2019 0859   ALKPHOS 89 04/19/2019 0859   AST 21 04/19/2019 0859   ALT 16 04/19/2019 0859   BILITOT 0.4 04/19/2019 0859       RADIOGRAPHIC STUDIES: CT Chest W Contrast  Result Date: 04/29/2019 CLINICAL DATA:  Restaging non-small cell lung cancer. EXAM: CT CHEST WITH CONTRAST TECHNIQUE: Multidetector CT imaging of the chest was performed during intravenous contrast administration. CONTRAST:  103mL OMNIPAQUE IOHEXOL 300 MG/ML  SOLN COMPARISON:  01/26/2019 FINDINGS: Cardiovascular: The heart size appears within normal limits. No pericardial effusion. Aortic atherosclerosis. Left main and LAD coronary artery calcifications. Mediastinum/Nodes: Normal appearance of the thyroid gland. The trachea appears patent and is midline. Normal appearance of the esophagus. Index right paratracheal lymph node measures 0.7 cm, image 66/2. Unchanged from previous exam. No new or progressive adenopathy. Lungs/Pleura: Mild changes of centrilobular and paraseptal emphysema. Small to moderate right pleural effusion identified, similar. Bandlike area of fibrosis and masslike architectural distortion within the right midlung is identified, new from previous exam, compatible with  changes secondary to external beam radiation. The central, perihilar lung lesion measures 1.9 by 1.2 cm, image 101/2. Previously 2.7 x 1.7 cm.There has been interval improved aeration to the right middle lobe. Upper Abdomen: Normal appearance of the adrenal glands. No acute abnormality noted within the upper abdomen. Musculoskeletal: Mild scoliosis and degenerative disc disease identified. IMPRESSION: 1. Interval decrease in size of central right perihilar lung mass. Resultant improvement in previous right middle lobe atelectasis is identified. 2. New bandlike area of fibrosis and masslike  architectural distortion within the right midlung is identified likely reflecting changes due to external beam radiation. 3. Coronary artery calcification Aortic Atherosclerosis (ICD10-I70.0) and Emphysema (ICD10-J43.9). Electronically Signed   By: Kerby Moors M.D.   On: 04/29/2019 14:37    ASSESSMENT AND PLAN: This is a very pleasant 69 years old white male with stage IIIA non-small cell lung cancer, squamous cell carcinoma.  The patient underwent a course of concurrent chemoradiation with weekly carboplatin and paclitaxel status post 7 cycles.  The patient tolerated his treatment well except for mild fatigue and odynophagia. He is currently undergoing consolidation treatment with immunotherapy with Imfinzi status post 6 cycles.   The patient has been tolerating this treatment fairly well with no concerning adverse effects. He had repeat CT scan of the chest performed recently.  I personally and independently reviewed the scans and discussed the results with the patient and his wife today. His scan showed further improvement of his disease in the right perihilar area but there was new development of radiation changes. I recommended for the patient to continue his current treatment with Imfinzi and he will proceed with cycle #7 today as planned. He will come back for follow-up visit in 2 weeks for evaluation before the next cycle of his treatment. For hypertension, he was advised to take his blood pressure medications and to monitor it closely at home and to discuss with his primary care physician if changes of his medication if needed. For the atrial fibrillation, the patient will continue his current treatment with Eliquis. He was advised to call immediately if he has any concerning symptoms in the interval. The patient voices understanding of current disease status and treatment options and is in agreement with the current care plan. All questions were answered. The patient knows to call the  clinic with any problems, questions or concerns. We can certainly see the patient much sooner if necessary.  Disclaimer: This note was dictated with voice recognition software. Similar sounding words can inadvertently be transcribed and may not be corrected upon review.

## 2019-05-03 NOTE — Patient Instructions (Signed)
[facility] Discharge Instructions for Patients Receiving Chemotherapy  Today you received the following agent: Imfinzi   If you develop nausea and vomiting that is not controlled by your nausea medication, call the clinic.   BELOW ARE SYMPTOMS THAT SHOULD BE REPORTED IMMEDIATELY:  *FEVER GREATER THAN 100.5 F  *CHILLS WITH OR WITHOUT FEVER  NAUSEA AND VOMITING THAT IS NOT CONTROLLED WITH YOUR NAUSEA MEDICATION  *UNUSUAL SHORTNESS OF BREATH  *UNUSUAL BRUISING OR BLEEDING  TENDERNESS IN MOUTH AND THROAT WITH OR WITHOUT PRESENCE OF ULCERS  *URINARY PROBLEMS  *BOWEL PROBLEMS  UNUSUAL RASH Items with * indicate a potential emergency and should be followed up as soon as possible.  Feel free to call the clinic should you have any questions or concerns. The clinic phone number is [phone].  Please show the [facility] at check-in to the Emergency Department and triage nurse.

## 2019-05-03 NOTE — Progress Notes (Signed)
OK to treat with today's elevated BP

## 2019-05-04 ENCOUNTER — Other Ambulatory Visit: Payer: Self-pay | Admitting: Pulmonary Disease

## 2019-05-06 ENCOUNTER — Other Ambulatory Visit: Payer: Self-pay | Admitting: Pulmonary Disease

## 2019-05-10 ENCOUNTER — Other Ambulatory Visit: Payer: Self-pay | Admitting: Cardiology

## 2019-05-10 MED ORDER — DILTIAZEM HCL ER COATED BEADS 360 MG PO CP24: 360.0000 mg | ORAL_CAPSULE | Freq: Every day | ORAL | 2 refills | Status: DC

## 2019-05-10 NOTE — Telephone Encounter (Signed)
Pt's medication was sent to pt's pharmacy as requested. Confirmation received.  °

## 2019-05-17 ENCOUNTER — Encounter: Payer: Self-pay | Admitting: Internal Medicine

## 2019-05-17 ENCOUNTER — Inpatient Hospital Stay (HOSPITAL_BASED_OUTPATIENT_CLINIC_OR_DEPARTMENT_OTHER): Payer: Medicare HMO | Admitting: Internal Medicine

## 2019-05-17 ENCOUNTER — Inpatient Hospital Stay: Payer: Medicare HMO

## 2019-05-17 ENCOUNTER — Other Ambulatory Visit: Payer: Self-pay

## 2019-05-17 VITALS — BP 112/81 | HR 78 | Temp 98.1°F | Resp 18 | Ht 73.0 in | Wt 184.8 lb

## 2019-05-17 DIAGNOSIS — I4891 Unspecified atrial fibrillation: Secondary | ICD-10-CM

## 2019-05-17 DIAGNOSIS — C3411 Malignant neoplasm of upper lobe, right bronchus or lung: Secondary | ICD-10-CM | POA: Diagnosis not present

## 2019-05-17 DIAGNOSIS — Z5112 Encounter for antineoplastic immunotherapy: Secondary | ICD-10-CM | POA: Diagnosis not present

## 2019-05-17 DIAGNOSIS — C3491 Malignant neoplasm of unspecified part of right bronchus or lung: Secondary | ICD-10-CM

## 2019-05-17 DIAGNOSIS — C3431 Malignant neoplasm of lower lobe, right bronchus or lung: Secondary | ICD-10-CM

## 2019-05-17 DIAGNOSIS — Z79899 Other long term (current) drug therapy: Secondary | ICD-10-CM | POA: Diagnosis not present

## 2019-05-17 LAB — CMP (CANCER CENTER ONLY)
ALT: 15 U/L (ref 0–44)
AST: 19 U/L (ref 15–41)
Albumin: 3.3 g/dL — ABNORMAL LOW (ref 3.5–5.0)
Alkaline Phosphatase: 93 U/L (ref 38–126)
Anion gap: 9 (ref 5–15)
BUN: 9 mg/dL (ref 8–23)
CO2: 22 mmol/L (ref 22–32)
Calcium: 8.8 mg/dL — ABNORMAL LOW (ref 8.9–10.3)
Chloride: 105 mmol/L (ref 98–111)
Creatinine: 0.78 mg/dL (ref 0.61–1.24)
GFR, Est AFR Am: >60 mL/min (ref >60)
GFR, Estimated: >60 mL/min (ref >60)
Glucose, Bld: 87 mg/dL (ref 70–99)
Potassium: 4.9 mmol/L (ref 3.5–5.1)
Sodium: 136 mmol/L (ref 135–145)
Total Bilirubin: 0.5 mg/dL (ref 0.3–1.2)
Total Protein: 6.8 g/dL (ref 6.5–8.1)

## 2019-05-17 LAB — CBC WITH DIFFERENTIAL (CANCER CENTER ONLY)
Abs Immature Granulocytes: 0.01 10*3/uL (ref 0.00–0.07)
Basophils Absolute: 0.1 10*3/uL (ref 0.0–0.1)
Basophils Relative: 1 %
Eosinophils Absolute: 0.1 10*3/uL (ref 0.0–0.5)
Eosinophils Relative: 2 %
HCT: 43.5 % (ref 39.0–52.0)
Hemoglobin: 15.3 g/dL (ref 13.0–17.0)
Immature Granulocytes: 0 %
Lymphocytes Relative: 24 %
Lymphs Abs: 1.4 10*3/uL (ref 0.7–4.0)
MCH: 38.4 pg — ABNORMAL HIGH (ref 26.0–34.0)
MCHC: 35.2 g/dL (ref 30.0–36.0)
MCV: 109.3 fL — ABNORMAL HIGH (ref 80.0–100.0)
Monocytes Absolute: 0.7 10*3/uL (ref 0.1–1.0)
Monocytes Relative: 12 %
Neutro Abs: 3.4 10*3/uL (ref 1.7–7.7)
Neutrophils Relative %: 61 %
Platelet Count: 198 10*3/uL (ref 150–400)
RBC: 3.98 MIL/uL — ABNORMAL LOW (ref 4.22–5.81)
RDW: 12.2 % (ref 11.5–15.5)
WBC Count: 5.6 10*3/uL (ref 4.0–10.5)
nRBC: 0 % (ref 0.0–0.2)

## 2019-05-17 MED ORDER — SODIUM CHLORIDE 0.9 % IV SOLN
10.2000 mg/kg | Freq: Once | INTRAVENOUS | Status: AC
  Administered 2019-05-17: 10:00:00 860 mg via INTRAVENOUS
  Filled 2019-05-17: qty 10

## 2019-05-17 MED ORDER — SODIUM CHLORIDE 0.9 % IV SOLN
Freq: Once | INTRAVENOUS | Status: AC
  Filled 2019-05-17: qty 250

## 2019-05-17 NOTE — Progress Notes (Signed)
[facility] [phone]   [phone]  OFFICE PROGRESS NOTE  [name], MD [place] [address]  DIAGNOSIS: Stage IIIA (T3, N1, M0) non-small cell lung cancer, squamous cell carcinoma diagnosed in July 2020 and presented with right middle lobe mass and right hilar adenopathy with postobstructive pneumonia and suspicious right pleural effusion.  PRIOR THERAPY:  Weekly concurrent chemoradiation with Carboplatin for an AUC of 2 and paclitaxel 45 mg/m2. First dose 11/23/2018. Status post 7 cycles.   CURRENT THERAPY: Consolidation immunotherapy with Imfinzi 10 mg/KG every 2 weeks.  First dose February 08, 2019.  Status post 7 cycles.  INTERVAL HISTORY: Joe Cole 69 y.o. male returns to the clinic today for follow-up visit.  The patient is feeling fine today with no concerning complaints.  He denied having any chest pain, shortness of breath, cough or hemoptysis.  He denied having any fever or chills.  He has no nausea, vomiting, diarrhea or constipation.  He has no headache or visual changes.  He continues to tolerate his treatment with immunotherapy fairly well.  The patient is here today for evaluation before starting cycle #8.    MEDICAL HISTORY: Past Medical History:  Diagnosis Date  . Arthritis   . Atrial fibrillation ([place]) 10/2018  . COPD with emphysema ([place])   . Dyspnea   . Hypertension   . lung ca dx'd 10/2018    ALLERGIES:  has No Known Allergies.  MEDICATIONS:  Current Outpatient Medications  Medication Sig Dispense Refill  . acetaminophen (TYLENOL) 500 MG tablet Take 1,000 mg by mouth every 6 (six) hours as needed for moderate pain or headache.    . albuterol (PROAIR HFA) 108 (90 Base) MCG/ACT inhaler Inhale 2 puffs into the lungs every 6 (six) hours as needed for wheezing or shortness of breath. 1 Inhaler 5  . apixaban (ELIQUIS) 5 MG TABS tablet Take 1 tablet (5 mg total) by mouth 2 (two)  times daily. 60 tablet 9  . atorvastatin (LIPITOR) 20 MG tablet Take 20 mg by mouth daily.    Marland Kitchen diltiazem (CARDIZEM CD) 360 MG 24 hr capsule Take 1 capsule (360 mg total) by mouth daily. 90 capsule 2  . metoprolol succinate (TOPROL XL) 25 MG 24 hr tablet Take 1 tablet (25 mg total) by mouth at bedtime. 30 tablet 11  . metoprolol succinate (TOPROL XL) 50 MG 24 hr tablet Take 1 tablet (50 mg total) by mouth at bedtime. Take with or immediately following a meal. 30 tablet 11  . Probiotic Product (ALIGN) 4 MG CAPS Take 4 mg by mouth daily.    . prochlorperazine (COMPAZINE) 10 MG tablet Take 1 tablet (10 mg total) by mouth every 6 (six) hours as needed for nausea or vomiting. (Patient not taking: Reported on 05/03/2019) 30 tablet 0  . traZODone (DESYREL) 50 MG tablet TAKE 1 TABLET BY MOUTH EVERYDAY AT BEDTIME 90 tablet 1  . umeclidinium-vilanterol (ANORO ELLIPTA) 62.5-25 MCG/INH AEPB Inhale 1 puff into the lungs daily. 1 each 6   No current facility-administered medications for this visit.    SURGICAL HISTORY:  Past Surgical History:  Procedure Laterality Date  . CARDIOVERSION N/A 01/11/2019   Procedure: CARDIOVERSION;  Surgeon: Josue Hector, MD;  Location: [facility] ENDOSCOPY;  Service: Cardiovascular;  Laterality: N/A;  . KNEE ARTHROSCOPY     BIL   . TOE SURGERY     PINNED LEFT BIG TOE  . TONSILLECTOMY  T+A  AS CHILD  . TOTAL KNEE ARTHROPLASTY Left 08/19/2017   Procedure: TOTAL KNEE ARTHROPLASTY;  Surgeon: Renette Butters, MD;  Location: [place];  Service: Orthopedics;  Laterality: Left;  Marland Kitchen VIDEO BRONCHOSCOPY Bilateral 11/05/2018   Procedure: VIDEO BRONCHOSCOPY WITH FLUORO;  Surgeon: [name], MD;  Location: [facility] ENDOSCOPY;  Service: Cardiopulmonary;  Laterality: Bilateral;  . VIDEO BRONCHOSCOPY Bilateral 11/09/2018   Procedure: VIDEO BRONCHOSCOPY WITH FLUORO;  Surgeon: Rigoberto Noel, MD;  Location: [place];  Service: Cardiopulmonary;  Laterality: Bilateral;    REVIEW OF SYSTEMS:  A  comprehensive review of systems was negative.   PHYSICAL EXAMINATION: General appearance: alert, cooperative and no distress Head: Normocephalic, without obvious abnormality, atraumatic Neck: no adenopathy, no JVD, supple, symmetrical, trachea midline and thyroid not enlarged, symmetric, no tenderness/mass/nodules Lymph nodes: Cervical, supraclavicular, and axillary nodes normal. Resp: clear to auscultation bilaterally Back: symmetric, no curvature. ROM normal. No CVA tenderness. Cardio: regular rate and rhythm, S1, S2 normal, no murmur, click, rub or gallop GI: soft, non-tender; bowel sounds normal; no masses,  no organomegaly Extremities: extremities normal, atraumatic, no cyanosis or edema  ECOG PERFORMANCE STATUS: 1 - Symptomatic but completely ambulatory  Blood pressure 112/81, pulse 78, temperature 98.1 F (36.7 C), temperature source Temporal, resp. rate 18, height 6\' 1"  (1.854 m), weight 184 lb 12.8 oz (83.8 kg), SpO2 98 %.  LABORATORY DATA: Lab Results  Component Value Date   WBC 5.6 05/17/2019   HGB 15.3 05/17/2019   HCT 43.5 05/17/2019   MCV 109.3 (H) 05/17/2019   PLT 198 05/17/2019      Chemistry      Component Value Date/Time   NA 137 05/03/2019 0905   K 4.7 05/03/2019 0905   CL 105 05/03/2019 0905   CO2 20 (L) 05/03/2019 0905   BUN 9 05/03/2019 0905   CREATININE 0.81 05/03/2019 0905      Component Value Date/Time   CALCIUM 9.0 05/03/2019 0905   ALKPHOS 92 05/03/2019 0905   AST 24 05/03/2019 0905   ALT 21 05/03/2019 0905   BILITOT 0.4 05/03/2019 0905       RADIOGRAPHIC STUDIES: CT Chest W Contrast  Result Date: 04/29/2019 CLINICAL DATA:  Restaging non-small cell lung cancer. EXAM: CT CHEST WITH CONTRAST TECHNIQUE: Multidetector CT imaging of the chest was performed during intravenous contrast administration. CONTRAST:  66mL OMNIPAQUE IOHEXOL 300 MG/ML  SOLN COMPARISON:  01/26/2019 FINDINGS: Cardiovascular: The heart size appears within normal limits. No  pericardial effusion. Aortic atherosclerosis. Left main and LAD coronary artery calcifications. Mediastinum/Nodes: Normal appearance of the thyroid gland. The trachea appears patent and is midline. Normal appearance of the esophagus. Index right paratracheal lymph node measures 0.7 cm, image 66/2. Unchanged from previous exam. No new or progressive adenopathy. Lungs/Pleura: Mild changes of centrilobular and paraseptal emphysema. Small to moderate right pleural effusion identified, similar. Bandlike area of fibrosis and masslike architectural distortion within the right midlung is identified, new from previous exam, compatible with changes secondary to external beam radiation. The central, perihilar lung lesion measures 1.9 by 1.2 cm, image 101/2. Previously 2.7 x 1.7 cm.There has been interval improved aeration to the right middle lobe. Upper Abdomen: Normal appearance of the adrenal glands. No acute abnormality noted within the upper abdomen. Musculoskeletal: Mild scoliosis and degenerative disc disease identified. IMPRESSION: 1. Interval decrease in size of central right perihilar lung mass. Resultant improvement in previous right middle lobe atelectasis is identified. 2. New bandlike area of fibrosis and masslike architectural distortion within the  right midlung is identified likely reflecting changes due to external beam radiation. 3. Coronary artery calcification Aortic Atherosclerosis (ICD10-I70.0) and Emphysema (ICD10-J43.9). Electronically Signed   By: Kerby Moors M.D.   On: 04/29/2019 14:37    ASSESSMENT AND PLAN: This is a very pleasant 69 years old white male with stage IIIA non-small cell lung cancer, squamous cell carcinoma.  The patient underwent a course of concurrent chemoradiation with weekly carboplatin and paclitaxel status post 7 cycles.  The patient tolerated his treatment well except for mild fatigue and odynophagia. He is currently undergoing consolidation treatment with immunotherapy  with Imfinzi status post 7 cycles.   The patient has been tolerating the treatment well with no concerning adverse effects. I recommended for him to proceed with cycle #8 today as planned. He will come back for follow-up visit in 2 weeks for evaluation before the next cycle of his treatment. For the atrial fibrillation, the patient will continue his current treatment with Eliquis. The patient was advised to call immediately if he has any concerning symptoms in the interval. The patient voices understanding of current disease status and treatment options and is in agreement with the current care plan. All questions were answered. The patient knows to call the clinic with any problems, questions or concerns. We can certainly see the patient much sooner if necessary.  Disclaimer: This note was dictated with voice recognition software. Similar sounding words can inadvertently be transcribed and may not be corrected upon review.

## 2019-05-17 NOTE — Patient Instructions (Signed)
[facility] Discharge Instructions for Patients Receiving Chemotherapy  Today you received the following chemotherapy agents: durvalumab.  To help prevent nausea and vomiting after your treatment, we encourage you to take your nausea medication as directed.   If you develop nausea and vomiting that is not controlled by your nausea medication, call the clinic.   BELOW ARE SYMPTOMS THAT SHOULD BE REPORTED IMMEDIATELY:  *FEVER GREATER THAN 100.5 F  *CHILLS WITH OR WITHOUT FEVER  NAUSEA AND VOMITING THAT IS NOT CONTROLLED WITH YOUR NAUSEA MEDICATION  *UNUSUAL SHORTNESS OF BREATH  *UNUSUAL BRUISING OR BLEEDING  TENDERNESS IN MOUTH AND THROAT WITH OR WITHOUT PRESENCE OF ULCERS  *URINARY PROBLEMS  *BOWEL PROBLEMS  UNUSUAL RASH Items with * indicate a potential emergency and should be followed up as soon as possible.  Feel free to call the clinic should you have any questions or concerns. The clinic phone number is [phone].  Please show the CHEMO ALERT CARD at check-in to the Emergency Department and triage nurse.   

## 2019-05-30 NOTE — Progress Notes (Signed)
[place] OFFICE PROGRESS NOTE  [name], MD [place] [address]  DIAGNOSIS:  Stage IIIA (T3, N1, M0) non-small cell lung cancer, squamous cell carcinoma diagnosed in July 2020 and presented with right middle lobe mass and right hilar adenopathy with postobstructive pneumonia and suspicious right pleural effusion.  PRIOR THERAPY: Weekly concurrent chemoradiation with Carboplatin for an AUC of 2 and paclitaxel 45 mg/m2. First dose 11/23/2018. Status post 7 cycles.  CURRENT THERAPY: Consolidation immunotherapy with Imfinzi 10 mg/KG every 2 weeks. First dose February 08, 2019. Status post 8cycles  INTERVAL HISTORY: Joe Cole 69 y.o. male returns to the clinic for a follow up visit. The patient is feeling well today without any concerning complaints. The patient continues to tolerate treatment with Imfinzi well without any adverse side effects except for mild fatigue the day following treatment. Denies any fever, chills, night sweats, or weight loss. Denies any chest pain, cough, or hemoptysis. He reports his usual unchanged shortness of breath with exertion. Denies any nausea, vomiting, diarrhea, or constipation. Denies any headache or visual changes. Denies any rashes or skin changes. The patient is here today for evaluation prior to starting cycle # 9  MEDICAL HISTORY: Past Medical History:  Diagnosis Date  . Arthritis   . Atrial fibrillation ([place]) 10/2018  . COPD with emphysema ([place])   . Dyspnea   . Hypertension   . lung ca dx'd 10/2018    ALLERGIES:  has No Known Allergies.  MEDICATIONS:  Current Outpatient Medications  Medication Sig Dispense Refill  . acetaminophen (TYLENOL) 500 MG tablet Take 1,000 mg by mouth every 6 (six) hours as needed for moderate pain or headache.    . albuterol (PROAIR HFA) 108 (90 Base) MCG/ACT inhaler Inhale 2 puffs into the lungs every 6 (six) hours as needed for wheezing or shortness of  breath. 1 Inhaler 5  . apixaban (ELIQUIS) 5 MG TABS tablet Take 1 tablet (5 mg total) by mouth 2 (two) times daily. 60 tablet 9  . atorvastatin (LIPITOR) 20 MG tablet Take 20 mg by mouth daily.    Marland Kitchen diltiazem (CARDIZEM CD) 360 MG 24 hr capsule Take 1 capsule (360 mg total) by mouth daily. 90 capsule 2  . metoprolol succinate (TOPROL XL) 25 MG 24 hr tablet Take 1 tablet (25 mg total) by mouth at bedtime. 30 tablet 11  . metoprolol succinate (TOPROL XL) 50 MG 24 hr tablet Take 1 tablet (50 mg total) by mouth at bedtime. Take with or immediately following a meal. 30 tablet 11  . Probiotic Product (ALIGN) 4 MG CAPS Take 4 mg by mouth daily.    . prochlorperazine (COMPAZINE) 10 MG tablet Take 1 tablet (10 mg total) by mouth every 6 (six) hours as needed for nausea or vomiting. 30 tablet 0  . traZODone (DESYREL) 50 MG tablet TAKE 1 TABLET BY MOUTH EVERYDAY AT BEDTIME 90 tablet 1  . umeclidinium-vilanterol (ANORO ELLIPTA) 62.5-25 MCG/INH AEPB Inhale 1 puff into the lungs daily. 1 each 6   No current facility-administered medications for this visit.    SURGICAL HISTORY:  Past Surgical History:  Procedure Laterality Date  . CARDIOVERSION N/A 01/11/2019   Procedure: CARDIOVERSION;  Surgeon: Josue Hector, MD;  Location: [facility] ENDOSCOPY;  Service: Cardiovascular;  Laterality: N/A;  . KNEE ARTHROSCOPY     BIL   . TOE SURGERY     PINNED LEFT BIG TOE  . TONSILLECTOMY     T+A  AS CHILD  . TOTAL KNEE ARTHROPLASTY Left 08/19/2017   Procedure: TOTAL KNEE ARTHROPLASTY;  Surgeon: Renette Butters, MD;  Location: [facility];  Service: Orthopedics;  Laterality: Left;  Marland Kitchen VIDEO BRONCHOSCOPY Bilateral 11/05/2018   Procedure: VIDEO BRONCHOSCOPY WITH FLUORO;  Surgeon: [name], MD;  Location: [facility] ENDOSCOPY;  Service: Cardiopulmonary;  Laterality: Bilateral;  . VIDEO BRONCHOSCOPY Bilateral 11/09/2018   Procedure: VIDEO BRONCHOSCOPY WITH FLUORO;  Surgeon: Rigoberto Noel, MD;  Location: [place];  Service:  Cardiopulmonary;  Laterality: Bilateral;    REVIEW OF SYSTEMS:   Review of Systems  Constitutional: Negative for appetite change, chills, fatigue, fever and unexpected weight change.  HENT: Negative for mouth sores, nosebleeds, sore throat and trouble swallowing.   Eyes: Negative for eye problems and icterus.  Respiratory: Positive for dyspnea on exertion. Negative for cough, hemoptysis, and wheezing.   Cardiovascular: Negative for chest pain and leg swelling.  Gastrointestinal: Negative for abdominal pain, constipation, diarrhea, nausea and vomiting.  Genitourinary: Negative for bladder incontinence, difficulty urinating, dysuria, frequency and hematuria.   Musculoskeletal: Negative for back pain, gait problem, neck pain and neck stiffness.  Skin: Negative for itching and rash.  Neurological: Negative for dizziness, extremity weakness, gait problem, headaches, light-headedness and seizures.  Hematological: Negative for adenopathy. Does not bruise/bleed easily.  Psychiatric/Behavioral: Negative for confusion, depression and sleep disturbance. The patient is not nervous/anxious.     PHYSICAL EXAMINATION:  Blood pressure 139/83, pulse 67, temperature 97.8 F (36.6 C), temperature source Temporal, resp. rate 17, height 6\' 1"  (1.854 m), weight 182 lb 14.4 oz (83 kg), SpO2 100 %.  ECOG PERFORMANCE STATUS: 1 - Symptomatic but completely ambulatory  Physical Exam  Constitutional: Oriented to person, place, and time and well-developed, well-nourished, and in no distress.  HENT:  Head: Normocephalic and atraumatic.  Mouth/Throat: Oropharynx is clear and moist. No oropharyngeal exudate.  Eyes: Conjunctivae are normal. Right eye exhibits no discharge. Left eye exhibits no discharge. No scleral icterus.  Neck: Normal range of motion. Neck supple.  Cardiovascular: Normal rate, irregular rhythm, normal heart sounds and intact distal pulses.   Pulmonary/Chest: Effort normal and breath sounds  normal. No respiratory distress. No wheezes. No rales.  Abdominal: Soft. Bowel sounds are normal. Exhibits no distension and no mass. There is no tenderness.  Musculoskeletal: Normal range of motion. Mild but stable bilateral lower extremity swelling. Lymphadenopathy:    No cervical adenopathy.  Neurological: Alert and oriented to person, place, and time. Exhibits normal muscle tone. Gait normal. Coordination normal.  Skin: Skin is warm and dry. No rash noted. Not diaphoretic. No erythema. No pallor.  Psychiatric: Mood, memory and judgment normal.  Vitals reviewed.  LABORATORY DATA: Lab Results  Component Value Date   WBC 5.0 05/31/2019   HGB 15.8 05/31/2019   HCT 46.2 05/31/2019   MCV 110.3 (H) 05/31/2019   PLT 182 05/31/2019      Chemistry      Component Value Date/Time   NA 136 05/31/2019 1005   K 4.9 05/31/2019 1005   CL 105 05/31/2019 1005   CO2 23 05/31/2019 1005   BUN 9 05/31/2019 1005   CREATININE 0.81 05/31/2019 1005      Component Value Date/Time   CALCIUM 9.2 05/31/2019 1005   ALKPHOS 91 05/31/2019 1005   AST 22 05/31/2019 1005   ALT 20 05/31/2019 1005   BILITOT 0.9 05/31/2019 1005       RADIOGRAPHIC STUDIES:  No results found.   ASSESSMENT/PLAN:  This is a very  pleasant 69 year old Caucasian male diagnosed with stage IIIa (T3,N1,M0) non-small cell lung cancer, squamous cell carcinoma.He presented with a right middle lobe lung mass and right hilar adenopathy with postobstructive pneumonia and a suspicious right pleural effusion. He was diagnosed in July 2020.  The patient completed 5 cycles of weekly concurrent chemoradiation with carboplatin for an AUC of 2 and paclitaxel 45 mgm/2.   He is currently undergoing consolidation immunotherapy with Imfinzi 10 mg/kg IV every 2 weeks. He is status post 8 cycles and he is tolerating it well without any adverse side effects.   Labs were reviewed. I recommend that he proceed with cycle 9 today as scheduled.  We will see him back for a follow up visit in 2 weeks for evaluation before starting cycle #10.  The patient was advised to call immediately if he has any concerning symptoms in the interval. The patient voices understanding of current disease status and treatment options and is in agreement with the current care plan. All questions were answered. The patient knows to call the clinic with any problems, questions or concerns. We can certainly see the patient much sooner if necessary   No orders of the defined types were placed in this encounter.    Bethan Adamek L Betania Dizon, PA-C 05/31/19

## 2019-05-31 ENCOUNTER — Encounter: Payer: Self-pay | Admitting: Physician Assistant

## 2019-05-31 ENCOUNTER — Inpatient Hospital Stay: Payer: Medicare HMO

## 2019-05-31 ENCOUNTER — Inpatient Hospital Stay: Payer: Medicare HMO | Attending: Internal Medicine | Admitting: Physician Assistant

## 2019-05-31 ENCOUNTER — Other Ambulatory Visit: Payer: Self-pay

## 2019-05-31 VITALS — BP 139/83 | HR 67 | Temp 97.8°F | Resp 17 | Ht 73.0 in | Wt 182.9 lb

## 2019-05-31 DIAGNOSIS — C3431 Malignant neoplasm of lower lobe, right bronchus or lung: Secondary | ICD-10-CM

## 2019-05-31 DIAGNOSIS — Z79899 Other long term (current) drug therapy: Secondary | ICD-10-CM | POA: Diagnosis not present

## 2019-05-31 DIAGNOSIS — C3491 Malignant neoplasm of unspecified part of right bronchus or lung: Secondary | ICD-10-CM

## 2019-05-31 DIAGNOSIS — C342 Malignant neoplasm of middle lobe, bronchus or lung: Secondary | ICD-10-CM | POA: Diagnosis present

## 2019-05-31 DIAGNOSIS — Z5111 Encounter for antineoplastic chemotherapy: Secondary | ICD-10-CM | POA: Diagnosis not present

## 2019-05-31 DIAGNOSIS — Z5112 Encounter for antineoplastic immunotherapy: Secondary | ICD-10-CM | POA: Diagnosis not present

## 2019-05-31 LAB — CBC WITH DIFFERENTIAL (CANCER CENTER ONLY)
Abs Immature Granulocytes: 0.01 10*3/uL (ref 0.00–0.07)
Basophils Absolute: 0.1 10*3/uL (ref 0.0–0.1)
Basophils Relative: 1 %
Eosinophils Absolute: 0.1 10*3/uL (ref 0.0–0.5)
Eosinophils Relative: 2 %
HCT: 46.2 % (ref 39.0–52.0)
Hemoglobin: 15.8 g/dL (ref 13.0–17.0)
Immature Granulocytes: 0 %
Lymphocytes Relative: 28 %
Lymphs Abs: 1.4 10*3/uL (ref 0.7–4.0)
MCH: 37.7 pg — ABNORMAL HIGH (ref 26.0–34.0)
MCHC: 34.2 g/dL (ref 30.0–36.0)
MCV: 110.3 fL — ABNORMAL HIGH (ref 80.0–100.0)
Monocytes Absolute: 0.7 10*3/uL (ref 0.1–1.0)
Monocytes Relative: 13 %
Neutro Abs: 2.8 10*3/uL (ref 1.7–7.7)
Neutrophils Relative %: 56 %
Platelet Count: 182 10*3/uL (ref 150–400)
RBC: 4.19 MIL/uL — ABNORMAL LOW (ref 4.22–5.81)
RDW: 12.9 % (ref 11.5–15.5)
WBC Count: 5 10*3/uL (ref 4.0–10.5)
nRBC: 0 % (ref 0.0–0.2)

## 2019-05-31 LAB — CMP (CANCER CENTER ONLY)
ALT: 20 U/L (ref 0–44)
AST: 22 U/L (ref 15–41)
Albumin: 3.4 g/dL — ABNORMAL LOW (ref 3.5–5.0)
Alkaline Phosphatase: 91 U/L (ref 38–126)
Anion gap: 8 (ref 5–15)
BUN: 9 mg/dL (ref 8–23)
CO2: 23 mmol/L (ref 22–32)
Calcium: 9.2 mg/dL (ref 8.9–10.3)
Chloride: 105 mmol/L (ref 98–111)
Creatinine: 0.81 mg/dL (ref 0.61–1.24)
GFR, Est AFR Am: >60 mL/min (ref >60)
GFR, Estimated: >60 mL/min (ref >60)
Glucose, Bld: 95 mg/dL (ref 70–99)
Potassium: 4.9 mmol/L (ref 3.5–5.1)
Sodium: 136 mmol/L (ref 135–145)
Total Bilirubin: 0.9 mg/dL (ref 0.3–1.2)
Total Protein: 6.9 g/dL (ref 6.5–8.1)

## 2019-05-31 LAB — TSH: TSH: 0.442 u[IU]/mL (ref 0.320–4.118)

## 2019-05-31 MED ORDER — SODIUM CHLORIDE 0.9 % IV SOLN
10.2000 mg/kg | Freq: Once | INTRAVENOUS | Status: AC
  Administered 2019-05-31: 12:00:00 860 mg via INTRAVENOUS
  Filled 2019-05-31: qty 10

## 2019-05-31 MED ORDER — SODIUM CHLORIDE 0.9 % IV SOLN
Freq: Once | INTRAVENOUS | Status: AC
  Filled 2019-05-31: qty 250

## 2019-05-31 NOTE — Patient Instructions (Signed)
[facility] Discharge Instructions for Patients Receiving Chemotherapy  Today you received the following chemotherapy agents: Imfinzi.  To help prevent nausea and vomiting after your treatment, we encourage you to take your nausea medication as directed.   If you develop nausea and vomiting that is not controlled by your nausea medication, call the clinic.   BELOW ARE SYMPTOMS THAT SHOULD BE REPORTED IMMEDIATELY:  *FEVER GREATER THAN 100.5 F  *CHILLS WITH OR WITHOUT FEVER  NAUSEA AND VOMITING THAT IS NOT CONTROLLED WITH YOUR NAUSEA MEDICATION  *UNUSUAL SHORTNESS OF BREATH  *UNUSUAL BRUISING OR BLEEDING  TENDERNESS IN MOUTH AND THROAT WITH OR WITHOUT PRESENCE OF ULCERS  *URINARY PROBLEMS  *BOWEL PROBLEMS  UNUSUAL RASH Items with * indicate a potential emergency and should be followed up as soon as possible.  Feel free to call the clinic should you have any questions or concerns. The clinic phone number is [phone].  Please show the CHEMO ALERT CARD at check-in to the Emergency Department and triage nurse.   

## 2019-06-02 ENCOUNTER — Telehealth: Payer: Self-pay | Admitting: Internal Medicine

## 2019-06-02 NOTE — Telephone Encounter (Signed)
Scheduled per los. Called and spoke with patient. Confirmed appts  

## 2019-06-14 ENCOUNTER — Inpatient Hospital Stay: Payer: Medicare HMO

## 2019-06-14 ENCOUNTER — Encounter: Payer: Self-pay | Admitting: Internal Medicine

## 2019-06-14 ENCOUNTER — Inpatient Hospital Stay (HOSPITAL_BASED_OUTPATIENT_CLINIC_OR_DEPARTMENT_OTHER): Payer: Medicare HMO | Admitting: Internal Medicine

## 2019-06-14 ENCOUNTER — Other Ambulatory Visit: Payer: Self-pay

## 2019-06-14 VITALS — BP 120/81 | HR 65 | Temp 98.5°F | Resp 16

## 2019-06-14 DIAGNOSIS — Z5112 Encounter for antineoplastic immunotherapy: Secondary | ICD-10-CM | POA: Diagnosis not present

## 2019-06-14 DIAGNOSIS — I4891 Unspecified atrial fibrillation: Secondary | ICD-10-CM

## 2019-06-14 DIAGNOSIS — Z5111 Encounter for antineoplastic chemotherapy: Secondary | ICD-10-CM | POA: Diagnosis not present

## 2019-06-14 DIAGNOSIS — C3491 Malignant neoplasm of unspecified part of right bronchus or lung: Secondary | ICD-10-CM

## 2019-06-14 DIAGNOSIS — C342 Malignant neoplasm of middle lobe, bronchus or lung: Secondary | ICD-10-CM | POA: Diagnosis not present

## 2019-06-14 DIAGNOSIS — Z79899 Other long term (current) drug therapy: Secondary | ICD-10-CM | POA: Diagnosis not present

## 2019-06-14 DIAGNOSIS — C3431 Malignant neoplasm of lower lobe, right bronchus or lung: Secondary | ICD-10-CM

## 2019-06-14 LAB — CBC WITH DIFFERENTIAL (CANCER CENTER ONLY)
Abs Immature Granulocytes: 0.01 10*3/uL (ref 0.00–0.07)
Basophils Absolute: 0.1 10*3/uL (ref 0.0–0.1)
Basophils Relative: 1 %
Eosinophils Absolute: 0.1 10*3/uL (ref 0.0–0.5)
Eosinophils Relative: 1 %
HCT: 44.7 % (ref 39.0–52.0)
Hemoglobin: 15.9 g/dL (ref 13.0–17.0)
Immature Granulocytes: 0 %
Lymphocytes Relative: 25 %
Lymphs Abs: 1.6 10*3/uL (ref 0.7–4.0)
MCH: 37.9 pg — ABNORMAL HIGH (ref 26.0–34.0)
MCHC: 35.6 g/dL (ref 30.0–36.0)
MCV: 106.7 fL — ABNORMAL HIGH (ref 80.0–100.0)
Monocytes Absolute: 0.7 10*3/uL (ref 0.1–1.0)
Monocytes Relative: 11 %
Neutro Abs: 3.9 10*3/uL (ref 1.7–7.7)
Neutrophils Relative %: 62 %
Platelet Count: 196 10*3/uL (ref 150–400)
RBC: 4.19 MIL/uL — ABNORMAL LOW (ref 4.22–5.81)
RDW: 13 % (ref 11.5–15.5)
WBC Count: 6.3 10*3/uL (ref 4.0–10.5)
nRBC: 0 % (ref 0.0–0.2)

## 2019-06-14 LAB — CMP (CANCER CENTER ONLY)
ALT: 20 U/L (ref 0–44)
AST: 23 U/L (ref 15–41)
Albumin: 3.5 g/dL (ref 3.5–5.0)
Alkaline Phosphatase: 82 U/L (ref 38–126)
Anion gap: 9 (ref 5–15)
BUN: 12 mg/dL (ref 8–23)
CO2: 22 mmol/L (ref 22–32)
Calcium: 8.9 mg/dL (ref 8.9–10.3)
Chloride: 102 mmol/L (ref 98–111)
Creatinine: 0.78 mg/dL (ref 0.61–1.24)
GFR, Est AFR Am: >60 mL/min (ref >60)
GFR, Estimated: >60 mL/min (ref >60)
Glucose, Bld: 90 mg/dL (ref 70–99)
Potassium: 4.6 mmol/L (ref 3.5–5.1)
Sodium: 133 mmol/L — ABNORMAL LOW (ref 135–145)
Total Bilirubin: 1.1 mg/dL (ref 0.3–1.2)
Total Protein: 7.1 g/dL (ref 6.5–8.1)

## 2019-06-14 MED ORDER — SODIUM CHLORIDE 0.9 % IV SOLN
10.0000 mg/kg | Freq: Once | INTRAVENOUS | Status: AC
  Administered 2019-06-14: 860 mg via INTRAVENOUS
  Filled 2019-06-14: qty 7.2

## 2019-06-14 MED ORDER — SODIUM CHLORIDE 0.9 % IV SOLN
Freq: Once | INTRAVENOUS | Status: AC
  Filled 2019-06-14: qty 250

## 2019-06-14 NOTE — Progress Notes (Signed)
[place] [phone]   [phone]  OFFICE PROGRESS NOTE  [name], MD [place] [address]  DIAGNOSIS: Stage IIIA (T3, N1, M0) non-small cell lung cancer, squamous cell carcinoma diagnosed in July 2020 and presented with right middle lobe mass and right hilar adenopathy with postobstructive pneumonia and suspicious right pleural effusion.  PRIOR THERAPY:  Weekly concurrent chemoradiation with Carboplatin for an AUC of 2 and paclitaxel 45 mg/m2. First dose 11/23/2018. Status post 7 cycles.   CURRENT THERAPY: Consolidation immunotherapy with Imfinzi 10 mg/KG every 2 weeks.  First dose February 08, 2019.  Status post 9 cycles.  INTERVAL HISTORY: Joe Cole 69 y.o. male returns to the clinic today for follow-up visit.  The patient is feeling fine today with no concerning complaints.  He denied having any chest pain, shortness of breath, cough or hemoptysis.  He denied having any fever or chills.  He has no nausea, vomiting, diarrhea or constipation.  He has no headache or visual changes.  He has no recent weight loss or night sweats.  He continues to tolerate his consolidation treatment with Imfinzi fairly well.  The patient is here today for evaluation before starting cycle #10.   MEDICAL HISTORY: Past Medical History:  Diagnosis Date  . Arthritis   . Atrial fibrillation ([place]) 10/2018  . COPD with emphysema ([place])   . Dyspnea   . Hypertension   . lung ca dx'd 10/2018    ALLERGIES:  has No Known Allergies.  MEDICATIONS:  Current Outpatient Medications  Medication Sig Dispense Refill  . acetaminophen (TYLENOL) 500 MG tablet Take 1,000 mg by mouth every 6 (six) hours as needed for moderate pain or headache.    . albuterol (PROAIR HFA) 108 (90 Base) MCG/ACT inhaler Inhale 2 puffs into the lungs every 6 (six) hours as needed for wheezing or shortness of breath. 1 Inhaler 5  . apixaban (ELIQUIS) 5 MG TABS  tablet Take 1 tablet (5 mg total) by mouth 2 (two) times daily. 60 tablet 9  . atorvastatin (LIPITOR) 20 MG tablet Take 20 mg by mouth daily.    Marland Kitchen diltiazem (CARDIZEM CD) 360 MG 24 hr capsule Take 1 capsule (360 mg total) by mouth daily. 90 capsule 2  . metoprolol succinate (TOPROL XL) 25 MG 24 hr tablet Take 1 tablet (25 mg total) by mouth at bedtime. 30 tablet 11  . metoprolol succinate (TOPROL XL) 50 MG 24 hr tablet Take 1 tablet (50 mg total) by mouth at bedtime. Take with or immediately following a meal. 30 tablet 11  . Probiotic Product (ALIGN) 4 MG CAPS Take 4 mg by mouth daily.    . prochlorperazine (COMPAZINE) 10 MG tablet Take 1 tablet (10 mg total) by mouth every 6 (six) hours as needed for nausea or vomiting. 30 tablet 0  . traZODone (DESYREL) 50 MG tablet TAKE 1 TABLET BY MOUTH EVERYDAY AT BEDTIME 90 tablet 1  . umeclidinium-vilanterol (ANORO ELLIPTA) 62.5-25 MCG/INH AEPB Inhale 1 puff into the lungs daily. 1 each 6   No current facility-administered medications for this visit.    SURGICAL HISTORY:  Past Surgical History:  Procedure Laterality Date  . CARDIOVERSION N/A 01/11/2019   Procedure: CARDIOVERSION;  Surgeon: Josue Hector, MD;  Location: [facility] ENDOSCOPY;  Service: Cardiovascular;  Laterality: N/A;  . KNEE ARTHROSCOPY     BIL   . TOE SURGERY     PINNED LEFT BIG TOE  .  TONSILLECTOMY     T+A  AS CHILD  . TOTAL KNEE ARTHROPLASTY Left 08/19/2017   Procedure: TOTAL KNEE ARTHROPLASTY;  Surgeon: Renette Butters, MD;  Location: [place];  Service: Orthopedics;  Laterality: Left;  Marland Kitchen VIDEO BRONCHOSCOPY Bilateral 11/05/2018   Procedure: VIDEO BRONCHOSCOPY WITH FLUORO;  Surgeon: [name], MD;  Location: [facility] ENDOSCOPY;  Service: Cardiopulmonary;  Laterality: Bilateral;  . VIDEO BRONCHOSCOPY Bilateral 11/09/2018   Procedure: VIDEO BRONCHOSCOPY WITH FLUORO;  Surgeon: Rigoberto Noel, MD;  Location: [place];  Service: Cardiopulmonary;  Laterality: Bilateral;    REVIEW OF SYSTEMS:   A comprehensive review of systems was negative.   PHYSICAL EXAMINATION: General appearance: alert, cooperative and no distress Head: Normocephalic, without obvious abnormality, atraumatic Neck: no adenopathy, no JVD, supple, symmetrical, trachea midline and thyroid not enlarged, symmetric, no tenderness/mass/nodules Lymph nodes: Cervical, supraclavicular, and axillary nodes normal. Resp: clear to auscultation bilaterally Back: symmetric, no curvature. ROM normal. No CVA tenderness. Cardio: regular rate and rhythm, S1, S2 normal, no murmur, click, rub or gallop GI: soft, non-tender; bowel sounds normal; no masses,  no organomegaly Extremities: extremities normal, atraumatic, no cyanosis or edema  ECOG PERFORMANCE STATUS: 1 - Symptomatic but completely ambulatory  There were no vitals taken for this visit.  LABORATORY DATA: Lab Results  Component Value Date   WBC 6.3 06/14/2019   HGB 15.9 06/14/2019   HCT 44.7 06/14/2019   MCV 106.7 (H) 06/14/2019   PLT 196 06/14/2019      Chemistry      Component Value Date/Time   NA 136 05/31/2019 1005   K 4.9 05/31/2019 1005   CL 105 05/31/2019 1005   CO2 23 05/31/2019 1005   BUN 9 05/31/2019 1005   CREATININE 0.81 05/31/2019 1005      Component Value Date/Time   CALCIUM 9.2 05/31/2019 1005   ALKPHOS 91 05/31/2019 1005   AST 22 05/31/2019 1005   ALT 20 05/31/2019 1005   BILITOT 0.9 05/31/2019 1005       RADIOGRAPHIC STUDIES: No results found.  ASSESSMENT AND PLAN: This is a very pleasant 69 years old white male with stage IIIA non-small cell lung cancer, squamous cell carcinoma.  The patient underwent a course of concurrent chemoradiation with weekly carboplatin and paclitaxel status post 7 cycles.  The patient tolerated his treatment well except for mild fatigue and odynophagia. He is currently undergoing consolidation treatment with immunotherapy with Imfinzi status post 9 cycles.   The patient has been tolerating this treatment  well with no concerning adverse effects. I recommended for him to proceed with cycle #10 today as planned. I will see him back for follow-up visit in 2 weeks for evaluation before the next cycle of his treatment. For the atrial fibrillation, the patient will continue his current treatment with Eliquis. He was advised to call immediately if he has any concerning symptoms in the interval. The patient voices understanding of current disease status and treatment options and is in agreement with the current care plan. All questions were answered. The patient knows to call the clinic with any problems, questions or concerns. We can certainly see the patient much sooner if necessary.  Disclaimer: This note was dictated with voice recognition software. Similar sounding words can inadvertently be transcribed and may not be corrected upon review.

## 2019-06-14 NOTE — Patient Instructions (Signed)
[facility] Discharge Instructions for Patients Receiving Chemotherapy  Today you received the following chemotherapy agents: durvalumab.  To help prevent nausea and vomiting after your treatment, we encourage you to take your nausea medication as directed.   If you develop nausea and vomiting that is not controlled by your nausea medication, call the clinic.   BELOW ARE SYMPTOMS THAT SHOULD BE REPORTED IMMEDIATELY:  *FEVER GREATER THAN 100.5 F  *CHILLS WITH OR WITHOUT FEVER  NAUSEA AND VOMITING THAT IS NOT CONTROLLED WITH YOUR NAUSEA MEDICATION  *UNUSUAL SHORTNESS OF BREATH  *UNUSUAL BRUISING OR BLEEDING  TENDERNESS IN MOUTH AND THROAT WITH OR WITHOUT PRESENCE OF ULCERS  *URINARY PROBLEMS  *BOWEL PROBLEMS  UNUSUAL RASH Items with * indicate a potential emergency and should be followed up as soon as possible.  Feel free to call the clinic should you have any questions or concerns. The clinic phone number is [phone].  Please show the CHEMO ALERT CARD at check-in to the Emergency Department and triage nurse.   

## 2019-06-28 ENCOUNTER — Inpatient Hospital Stay: Payer: Medicare HMO

## 2019-06-28 ENCOUNTER — Other Ambulatory Visit: Payer: Self-pay

## 2019-06-28 ENCOUNTER — Encounter: Payer: Self-pay | Admitting: Internal Medicine

## 2019-06-28 ENCOUNTER — Inpatient Hospital Stay: Payer: Medicare HMO | Attending: Internal Medicine | Admitting: Internal Medicine

## 2019-06-28 VITALS — BP 132/80 | HR 58 | Temp 98.3°F | Resp 17 | Ht 73.0 in | Wt 182.9 lb

## 2019-06-28 DIAGNOSIS — Z79899 Other long term (current) drug therapy: Secondary | ICD-10-CM | POA: Insufficient documentation

## 2019-06-28 DIAGNOSIS — I10 Essential (primary) hypertension: Secondary | ICD-10-CM | POA: Diagnosis not present

## 2019-06-28 DIAGNOSIS — C3491 Malignant neoplasm of unspecified part of right bronchus or lung: Secondary | ICD-10-CM

## 2019-06-28 DIAGNOSIS — I4891 Unspecified atrial fibrillation: Secondary | ICD-10-CM | POA: Diagnosis not present

## 2019-06-28 DIAGNOSIS — Z5112 Encounter for antineoplastic immunotherapy: Secondary | ICD-10-CM | POA: Diagnosis not present

## 2019-06-28 DIAGNOSIS — C342 Malignant neoplasm of middle lobe, bronchus or lung: Secondary | ICD-10-CM | POA: Insufficient documentation

## 2019-06-28 DIAGNOSIS — C3431 Malignant neoplasm of lower lobe, right bronchus or lung: Secondary | ICD-10-CM

## 2019-06-28 LAB — CBC WITH DIFFERENTIAL (CANCER CENTER ONLY)
Abs Immature Granulocytes: 0.01 10*3/uL (ref 0.00–0.07)
Basophils Absolute: 0.1 10*3/uL (ref 0.0–0.1)
Basophils Relative: 1 %
Eosinophils Absolute: 0.1 10*3/uL (ref 0.0–0.5)
Eosinophils Relative: 1 %
HCT: 42.4 % (ref 39.0–52.0)
Hemoglobin: 14.8 g/dL (ref 13.0–17.0)
Immature Granulocytes: 0 %
Lymphocytes Relative: 26 %
Lymphs Abs: 1.4 10*3/uL (ref 0.7–4.0)
MCH: 37.7 pg — ABNORMAL HIGH (ref 26.0–34.0)
MCHC: 34.9 g/dL (ref 30.0–36.0)
MCV: 107.9 fL — ABNORMAL HIGH (ref 80.0–100.0)
Monocytes Absolute: 0.6 10*3/uL (ref 0.1–1.0)
Monocytes Relative: 12 %
Neutro Abs: 3.2 10*3/uL (ref 1.7–7.7)
Neutrophils Relative %: 60 %
Platelet Count: 179 10*3/uL (ref 150–400)
RBC: 3.93 MIL/uL — ABNORMAL LOW (ref 4.22–5.81)
RDW: 13.5 % (ref 11.5–15.5)
WBC Count: 5.3 10*3/uL (ref 4.0–10.5)
nRBC: 0 % (ref 0.0–0.2)

## 2019-06-28 LAB — CMP (CANCER CENTER ONLY)
ALT: 21 U/L (ref 0–44)
AST: 23 U/L (ref 15–41)
Albumin: 3.3 g/dL — ABNORMAL LOW (ref 3.5–5.0)
Alkaline Phosphatase: 83 U/L (ref 38–126)
Anion gap: 7 (ref 5–15)
BUN: 8 mg/dL (ref 8–23)
CO2: 24 mmol/L (ref 22–32)
Calcium: 8.8 mg/dL — ABNORMAL LOW (ref 8.9–10.3)
Chloride: 105 mmol/L (ref 98–111)
Creatinine: 0.76 mg/dL (ref 0.61–1.24)
GFR, Est AFR Am: >60 mL/min (ref >60)
GFR, Estimated: >60 mL/min (ref >60)
Glucose, Bld: 121 mg/dL — ABNORMAL HIGH (ref 70–99)
Potassium: 4.4 mmol/L (ref 3.5–5.1)
Sodium: 136 mmol/L (ref 135–145)
Total Bilirubin: 0.9 mg/dL (ref 0.3–1.2)
Total Protein: 6.6 g/dL (ref 6.5–8.1)

## 2019-06-28 LAB — TSH: TSH: 1.848 u[IU]/mL (ref 0.320–4.118)

## 2019-06-28 MED ORDER — SODIUM CHLORIDE 0.9 % IV SOLN
Freq: Once | INTRAVENOUS | Status: AC
  Filled 2019-06-28: qty 250

## 2019-06-28 MED ORDER — SODIUM CHLORIDE 0.9 % IV SOLN
10.0000 mg/kg | Freq: Once | INTRAVENOUS | Status: AC
  Administered 2019-06-28: 860 mg via INTRAVENOUS
  Filled 2019-06-28: qty 7.2

## 2019-06-28 NOTE — Progress Notes (Signed)
[place] Telephone:(336) [phone]   Fax:(336) [phone]  OFFICE PROGRESS NOTE  [name], MD [place] [address]  DIAGNOSIS: Stage IIIA (T3, N1, M0) non-small cell lung cancer, squamous cell carcinoma diagnosed in July 2020 and presented with right middle lobe mass and right hilar adenopathy with postobstructive pneumonia and suspicious right pleural effusion.  PRIOR THERAPY:  Weekly concurrent chemoradiation with Carboplatin for an AUC of 2 and paclitaxel 45 mg/m2. First dose 11/23/2018. Status post 7 cycles.   CURRENT THERAPY: Consolidation immunotherapy with Imfinzi 10 mg/KG every 2 weeks.  First dose February 08, 2019.  Status post 10 cycles.  INTERVAL HISTORY: Joe Cole 69 y.o. male returns to the clinic today for follow-up visit.  The patient is feeling fine today with no concerning complaints.  He denied having any chest pain, shortness of breath except with exertion with no cough or hemoptysis.  The patient denied having any fever or chills.  He has no headache or visual changes.  He denied having any recent weight loss or night sweats.  He continues to tolerate his treatment with consolidation Imfinzi fairly well.  The patient is here today for evaluation before starting cycle #11.   MEDICAL HISTORY: Past Medical History:  Diagnosis Date  . Arthritis   . Atrial fibrillation ([place]) 10/2018  . COPD with emphysema ([place])   . Dyspnea   . Hypertension   . lung ca dx'd 10/2018    ALLERGIES:  has No Known Allergies.  MEDICATIONS:  Current Outpatient Medications  Medication Sig Dispense Refill  . acetaminophen (TYLENOL) 500 MG tablet Take 1,000 mg by mouth every 6 (six) hours as needed for moderate pain or headache.    . albuterol (PROAIR HFA) 108 (90 Base) MCG/ACT inhaler Inhale 2 puffs into the lungs every 6 (six) hours as needed for wheezing or shortness of breath. 1 Inhaler 5  . apixaban (ELIQUIS) 5 MG TABS tablet  Take 1 tablet (5 mg total) by mouth 2 (two) times daily. 60 tablet 9  . atorvastatin (LIPITOR) 20 MG tablet Take 20 mg by mouth daily.    Marland Kitchen diltiazem (CARDIZEM CD) 360 MG 24 hr capsule Take 1 capsule (360 mg total) by mouth daily. 90 capsule 2  . metoprolol succinate (TOPROL XL) 25 MG 24 hr tablet Take 1 tablet (25 mg total) by mouth at bedtime. 30 tablet 11  . metoprolol succinate (TOPROL XL) 50 MG 24 hr tablet Take 1 tablet (50 mg total) by mouth at bedtime. Take with or immediately following a meal. 30 tablet 11  . Probiotic Product (ALIGN) 4 MG CAPS Take 4 mg by mouth daily.    . prochlorperazine (COMPAZINE) 10 MG tablet Take 1 tablet (10 mg total) by mouth every 6 (six) hours as needed for nausea or vomiting. (Patient not taking: Reported on 06/14/2019) 30 tablet 0  . traZODone (DESYREL) 50 MG tablet TAKE 1 TABLET BY MOUTH EVERYDAY AT BEDTIME 90 tablet 1  . umeclidinium-vilanterol (ANORO ELLIPTA) 62.5-25 MCG/INH AEPB Inhale 1 puff into the lungs daily. 1 each 6   No current facility-administered medications for this visit.    SURGICAL HISTORY:  Past Surgical History:  Procedure Laterality Date  . CARDIOVERSION N/A 01/11/2019   Procedure: CARDIOVERSION;  Surgeon: Josue Hector, MD;  Location: [facility] ENDOSCOPY;  Service: Cardiovascular;  Laterality: N/A;  . KNEE ARTHROSCOPY     BIL   . TOE SURGERY     PINNED  LEFT BIG TOE  . TONSILLECTOMY     T+A  AS CHILD  . TOTAL KNEE ARTHROPLASTY Left 08/19/2017   Procedure: TOTAL KNEE ARTHROPLASTY;  Surgeon: Renette Butters, MD;  Location: [place];  Service: Orthopedics;  Laterality: Left;  Marland Kitchen VIDEO BRONCHOSCOPY Bilateral 11/05/2018   Procedure: VIDEO BRONCHOSCOPY WITH FLUORO;  Surgeon: [name], MD;  Location: [facility] ENDOSCOPY;  Service: Cardiopulmonary;  Laterality: Bilateral;  . VIDEO BRONCHOSCOPY Bilateral 11/09/2018   Procedure: VIDEO BRONCHOSCOPY WITH FLUORO;  Surgeon: Rigoberto Noel, MD;  Location: [place];  Service: Cardiopulmonary;   Laterality: Bilateral;    REVIEW OF SYSTEMS:  A comprehensive review of systems was negative except for: Respiratory: positive for dyspnea on exertion   PHYSICAL EXAMINATION: General appearance: alert, cooperative and no distress Head: Normocephalic, without obvious abnormality, atraumatic Neck: no adenopathy, no JVD, supple, symmetrical, trachea midline and thyroid not enlarged, symmetric, no tenderness/mass/nodules Lymph nodes: Cervical, supraclavicular, and axillary nodes normal. Resp: clear to auscultation bilaterally Back: symmetric, no curvature. ROM normal. No CVA tenderness. Cardio: regular rate and rhythm, S1, S2 normal, no murmur, click, rub or gallop GI: soft, non-tender; bowel sounds normal; no masses,  no organomegaly Extremities: extremities normal, atraumatic, no cyanosis or edema  ECOG PERFORMANCE STATUS: 1 - Symptomatic but completely ambulatory  Blood pressure 132/80, pulse (!) 58, temperature 98.3 F (36.8 C), temperature source Temporal, resp. rate 17, height 6\' 1"  (1.854 m), weight 182 lb 14.4 oz (83 kg), SpO2 100 %.  LABORATORY DATA: Lab Results  Component Value Date   WBC 6.3 06/14/2019   HGB 15.9 06/14/2019   HCT 44.7 06/14/2019   MCV 106.7 (H) 06/14/2019   PLT 196 06/14/2019      Chemistry      Component Value Date/Time   NA 133 (L) 06/14/2019 1146   K 4.6 06/14/2019 1146   CL 102 06/14/2019 1146   CO2 22 06/14/2019 1146   BUN 12 06/14/2019 1146   CREATININE 0.78 06/14/2019 1146      Component Value Date/Time   CALCIUM 8.9 06/14/2019 1146   ALKPHOS 82 06/14/2019 1146   AST 23 06/14/2019 1146   ALT 20 06/14/2019 1146   BILITOT 1.1 06/14/2019 1146       RADIOGRAPHIC STUDIES: No results found.  ASSESSMENT AND PLAN: This is a very pleasant 69 years old white male with stage IIIA non-small cell lung cancer, squamous cell carcinoma.  The patient underwent a course of concurrent chemoradiation with weekly carboplatin and paclitaxel status post 7  cycles.  The patient tolerated his treatment well except for mild fatigue and odynophagia. He is currently undergoing consolidation treatment with immunotherapy with Imfinzi status post 10 cycles.   He continues to tolerate his treatment well with no concerning adverse effects. I recommended for him to proceed with cycle #11 today as planned. He will come back for follow-up visit in 2 weeks for evaluation before cycle #12. For the atrial fibrillation, the patient will continue his current treatment with Eliquis. The patient was advised to call immediately if he has any concerning symptoms in the interval. The patient voices understanding of current disease status and treatment options and is in agreement with the current care plan. All questions were answered. The patient knows to call the clinic with any problems, questions or concerns. We can certainly see the patient much sooner if necessary.  Disclaimer: This note was dictated with voice recognition software. Similar sounding words can inadvertently be transcribed and may not be corrected upon review.

## 2019-06-28 NOTE — Patient Instructions (Signed)
[facility] Discharge Instructions for Patients Receiving Chemotherapy  Today you received the following chemotherapy agents: durvalumab.  To help prevent nausea and vomiting after your treatment, we encourage you to take your nausea medication as directed.   If you develop nausea and vomiting that is not controlled by your nausea medication, call the clinic.   BELOW ARE SYMPTOMS THAT SHOULD BE REPORTED IMMEDIATELY:  *FEVER GREATER THAN 100.5 F  *CHILLS WITH OR WITHOUT FEVER  NAUSEA AND VOMITING THAT IS NOT CONTROLLED WITH YOUR NAUSEA MEDICATION  *UNUSUAL SHORTNESS OF BREATH  *UNUSUAL BRUISING OR BLEEDING  TENDERNESS IN MOUTH AND THROAT WITH OR WITHOUT PRESENCE OF ULCERS  *URINARY PROBLEMS  *BOWEL PROBLEMS  UNUSUAL RASH Items with * indicate a potential emergency and should be followed up as soon as possible.  Feel free to call the clinic should you have any questions or concerns. The clinic phone number is [phone].  Please show the CHEMO ALERT CARD at check-in to the Emergency Department and triage nurse.   

## 2019-07-10 ENCOUNTER — Other Ambulatory Visit: Payer: Self-pay | Admitting: Pulmonary Disease

## 2019-07-12 ENCOUNTER — Inpatient Hospital Stay (HOSPITAL_BASED_OUTPATIENT_CLINIC_OR_DEPARTMENT_OTHER): Payer: Medicare HMO | Admitting: Internal Medicine

## 2019-07-12 ENCOUNTER — Inpatient Hospital Stay: Payer: Medicare HMO

## 2019-07-12 ENCOUNTER — Encounter: Payer: Self-pay | Admitting: *Deleted

## 2019-07-12 ENCOUNTER — Other Ambulatory Visit: Payer: Self-pay

## 2019-07-12 ENCOUNTER — Encounter: Payer: Self-pay | Admitting: Internal Medicine

## 2019-07-12 VITALS — BP 137/93 | HR 69 | Temp 98.0°F | Resp 17 | Ht 73.0 in | Wt 184.8 lb

## 2019-07-12 DIAGNOSIS — C3491 Malignant neoplasm of unspecified part of right bronchus or lung: Secondary | ICD-10-CM

## 2019-07-12 DIAGNOSIS — C3490 Malignant neoplasm of unspecified part of unspecified bronchus or lung: Secondary | ICD-10-CM | POA: Diagnosis not present

## 2019-07-12 DIAGNOSIS — C3431 Malignant neoplasm of lower lobe, right bronchus or lung: Secondary | ICD-10-CM

## 2019-07-12 DIAGNOSIS — Z5112 Encounter for antineoplastic immunotherapy: Secondary | ICD-10-CM

## 2019-07-12 LAB — CMP (CANCER CENTER ONLY)
ALT: 24 U/L (ref 0–44)
AST: 27 U/L (ref 15–41)
Albumin: 3.4 g/dL — ABNORMAL LOW (ref 3.5–5.0)
Alkaline Phosphatase: 91 U/L (ref 38–126)
Anion gap: 8 (ref 5–15)
BUN: 8 mg/dL (ref 8–23)
CO2: 25 mmol/L (ref 22–32)
Calcium: 8.9 mg/dL (ref 8.9–10.3)
Chloride: 103 mmol/L (ref 98–111)
Creatinine: 0.75 mg/dL (ref 0.61–1.24)
GFR, Est AFR Am: >60 mL/min (ref >60)
GFR, Estimated: >60 mL/min (ref >60)
Glucose, Bld: 96 mg/dL (ref 70–99)
Potassium: 4.6 mmol/L (ref 3.5–5.1)
Sodium: 136 mmol/L (ref 135–145)
Total Bilirubin: 0.9 mg/dL (ref 0.3–1.2)
Total Protein: 6.7 g/dL (ref 6.5–8.1)

## 2019-07-12 LAB — CBC WITH DIFFERENTIAL (CANCER CENTER ONLY)
Abs Immature Granulocytes: 0.01 10*3/uL (ref 0.00–0.07)
Basophils Absolute: 0.1 10*3/uL (ref 0.0–0.1)
Basophils Relative: 1 %
Eosinophils Absolute: 0.1 10*3/uL (ref 0.0–0.5)
Eosinophils Relative: 2 %
HCT: 42.3 % (ref 39.0–52.0)
Hemoglobin: 14.9 g/dL (ref 13.0–17.0)
Immature Granulocytes: 0 %
Lymphocytes Relative: 30 %
Lymphs Abs: 1.5 10*3/uL (ref 0.7–4.0)
MCH: 38.1 pg — ABNORMAL HIGH (ref 26.0–34.0)
MCHC: 35.2 g/dL (ref 30.0–36.0)
MCV: 108.2 fL — ABNORMAL HIGH (ref 80.0–100.0)
Monocytes Absolute: 0.6 10*3/uL (ref 0.1–1.0)
Monocytes Relative: 12 %
Neutro Abs: 2.7 10*3/uL (ref 1.7–7.7)
Neutrophils Relative %: 55 %
Platelet Count: 186 10*3/uL (ref 150–400)
RBC: 3.91 MIL/uL — ABNORMAL LOW (ref 4.22–5.81)
RDW: 14.3 % (ref 11.5–15.5)
WBC Count: 5 10*3/uL (ref 4.0–10.5)
nRBC: 0 % (ref 0.0–0.2)

## 2019-07-12 MED ORDER — SODIUM CHLORIDE 0.9 % IV SOLN
Freq: Once | INTRAVENOUS | Status: AC
  Filled 2019-07-12: qty 250

## 2019-07-12 MED ORDER — SODIUM CHLORIDE 0.9 % IV SOLN
10.0000 mg/kg | Freq: Once | INTRAVENOUS | Status: AC
  Administered 2019-07-12: 860 mg via INTRAVENOUS
  Filled 2019-07-12: qty 7.2

## 2019-07-12 NOTE — Patient Instructions (Signed)
[facility] Discharge Instructions for Patients Receiving Chemotherapy  Today you received the following chemotherapy agents: Imfinzi.  To help prevent nausea and vomiting after your treatment, we encourage you to take your nausea medication as directed.   If you develop nausea and vomiting that is not controlled by your nausea medication, call the clinic.   BELOW ARE SYMPTOMS THAT SHOULD BE REPORTED IMMEDIATELY:  *FEVER GREATER THAN 100.5 F  *CHILLS WITH OR WITHOUT FEVER  NAUSEA AND VOMITING THAT IS NOT CONTROLLED WITH YOUR NAUSEA MEDICATION  *UNUSUAL SHORTNESS OF BREATH  *UNUSUAL BRUISING OR BLEEDING  TENDERNESS IN MOUTH AND THROAT WITH OR WITHOUT PRESENCE OF ULCERS  *URINARY PROBLEMS  *BOWEL PROBLEMS  UNUSUAL RASH Items with * indicate a potential emergency and should be followed up as soon as possible.  Feel free to call the clinic should you have any questions or concerns. The clinic phone number is [phone].  Please show the CHEMO ALERT CARD at check-in to the Emergency Department and triage nurse.   

## 2019-07-12 NOTE — Progress Notes (Signed)
[place] Telephone:(336) [phone]   Fax:(336) [phone]  OFFICE PROGRESS NOTE  [name], MD [place] [address]  DIAGNOSIS: Stage IIIA (T3, N1, M0) non-small cell lung cancer, squamous cell carcinoma diagnosed in July 2020 and presented with right middle lobe mass and right hilar adenopathy with postobstructive pneumonia and suspicious right pleural effusion.  PRIOR THERAPY:  Weekly concurrent chemoradiation with Carboplatin for an AUC of 2 and paclitaxel 45 mg/m2. First dose 11/23/2018. Status post 7 cycles.   CURRENT THERAPY: Consolidation immunotherapy with Imfinzi 10 mg/KG every 2 weeks.  First dose February 08, 2019.  Status post 11 cycles.  INTERVAL HISTORY: Joe Cole 69 y.o. male returns to the clinic today for follow-up visit.  The patient is feeling fine today with no concerning complaints.  He continues to tolerate his treatment with Imfinzi fairly well.  He denied having any current chest pain, shortness of breath, cough or hemoptysis.  He denied having any fever or chills.  He has no nausea, vomiting, diarrhea or constipation.  He denied having any headache or visual changes.  He is here today for evaluation before starting cycle #12 of his treatment.  MEDICAL HISTORY: Past Medical History:  Diagnosis Date  . Arthritis   . Atrial fibrillation ([place]) 10/2018  . COPD with emphysema ([place])   . Dyspnea   . Hypertension   . lung ca dx'd 10/2018    ALLERGIES:  has No Known Allergies.  MEDICATIONS:  Current Outpatient Medications  Medication Sig Dispense Refill  . acetaminophen (TYLENOL) 500 MG tablet Take 1,000 mg by mouth every 6 (six) hours as needed for moderate pain or headache.    . albuterol (PROAIR HFA) 108 (90 Base) MCG/ACT inhaler Inhale 2 puffs into the lungs every 6 (six) hours as needed for wheezing or shortness of breath. 1 Inhaler 5  . apixaban (ELIQUIS) 5 MG TABS tablet Take 1 tablet (5 mg total) by  mouth 2 (two) times daily. 60 tablet 9  . atorvastatin (LIPITOR) 20 MG tablet Take 20 mg by mouth daily.    Marland Kitchen diltiazem (CARDIZEM CD) 360 MG 24 hr capsule Take 1 capsule (360 mg total) by mouth daily. 90 capsule 2  . metoprolol succinate (TOPROL XL) 25 MG 24 hr tablet Take 1 tablet (25 mg total) by mouth at bedtime. 30 tablet 11  . metoprolol succinate (TOPROL XL) 50 MG 24 hr tablet Take 1 tablet (50 mg total) by mouth at bedtime. Take with or immediately following a meal. 30 tablet 11  . Probiotic Product (ALIGN) 4 MG CAPS Take 4 mg by mouth daily.    . prochlorperazine (COMPAZINE) 10 MG tablet Take 1 tablet (10 mg total) by mouth every 6 (six) hours as needed for nausea or vomiting. 30 tablet 0  . traZODone (DESYREL) 50 MG tablet TAKE 1 TABLET BY MOUTH EVERYDAY AT BEDTIME 30 tablet 0  . umeclidinium-vilanterol (ANORO ELLIPTA) 62.5-25 MCG/INH AEPB Inhale 1 puff into the lungs daily. 1 each 6   No current facility-administered medications for this visit.    SURGICAL HISTORY:  Past Surgical History:  Procedure Laterality Date  . CARDIOVERSION N/A 01/11/2019   Procedure: CARDIOVERSION;  Surgeon: Josue Hector, MD;  Location: [facility] ENDOSCOPY;  Service: Cardiovascular;  Laterality: N/A;  . KNEE ARTHROSCOPY     BIL   . TOE SURGERY     PINNED LEFT BIG TOE  . TONSILLECTOMY     T+A  AS CHILD  . TOTAL KNEE ARTHROPLASTY Left 08/19/2017   Procedure: TOTAL KNEE ARTHROPLASTY;  Surgeon: Renette Butters, MD;  Location: [place];  Service: Orthopedics;  Laterality: Left;  Marland Kitchen VIDEO BRONCHOSCOPY Bilateral 11/05/2018   Procedure: VIDEO BRONCHOSCOPY WITH FLUORO;  Surgeon: [name], MD;  Location: [facility] ENDOSCOPY;  Service: Cardiopulmonary;  Laterality: Bilateral;  . VIDEO BRONCHOSCOPY Bilateral 11/09/2018   Procedure: VIDEO BRONCHOSCOPY WITH FLUORO;  Surgeon: Rigoberto Noel, MD;  Location: [place];  Service: Cardiopulmonary;  Laterality: Bilateral;    REVIEW OF SYSTEMS:  A comprehensive review of systems  was negative.   PHYSICAL EXAMINATION: General appearance: alert, cooperative and no distress Head: Normocephalic, without obvious abnormality, atraumatic Neck: no adenopathy, no JVD, supple, symmetrical, trachea midline and thyroid not enlarged, symmetric, no tenderness/mass/nodules Lymph nodes: Cervical, supraclavicular, and axillary nodes normal. Resp: clear to auscultation bilaterally Back: symmetric, no curvature. ROM normal. No CVA tenderness. Cardio: regular rate and rhythm, S1, S2 normal, no murmur, click, rub or gallop GI: soft, non-tender; bowel sounds normal; no masses,  no organomegaly Extremities: extremities normal, atraumatic, no cyanosis or edema  ECOG PERFORMANCE STATUS: 1 - Symptomatic but completely ambulatory  Blood pressure (!) 137/93, pulse 69, temperature 98 F (36.7 C), temperature source Oral, resp. rate 17, height 6\' 1"  (1.854 m), weight 184 lb 12.8 oz (83.8 kg), SpO2 100 %.  LABORATORY DATA: Lab Results  Component Value Date   WBC 5.0 07/12/2019   HGB 14.9 07/12/2019   HCT 42.3 07/12/2019   MCV 108.2 (H) 07/12/2019   PLT 186 07/12/2019      Chemistry      Component Value Date/Time   NA 136 06/28/2019 1200   K 4.4 06/28/2019 1200   CL 105 06/28/2019 1200   CO2 24 06/28/2019 1200   BUN 8 06/28/2019 1200   CREATININE 0.76 06/28/2019 1200      Component Value Date/Time   CALCIUM 8.8 (L) 06/28/2019 1200   ALKPHOS 83 06/28/2019 1200   AST 23 06/28/2019 1200   ALT 21 06/28/2019 1200   BILITOT 0.9 06/28/2019 1200       RADIOGRAPHIC STUDIES: No results found.  ASSESSMENT AND PLAN: This is a very pleasant 69 years old white male with stage IIIA non-small cell lung cancer, squamous cell carcinoma.  The patient underwent a course of concurrent chemoradiation with weekly carboplatin and paclitaxel status post 7 cycles.  The patient tolerated his treatment well except for mild fatigue and odynophagia. He is currently undergoing consolidation treatment  with immunotherapy with Imfinzi status post 11 cycles.   I recommended for the patient to continue his current treatment with Imfinzi and he will proceed with cycle #12 today. I will see him back for follow-up visit in 2 weeks for evaluation with repeat CT scan of the chest for restaging of his disease. For the hypertension, he was advised to take his blood pressure medications as prescribed and to monitor it closely at home. For the atrial fibrillation, the patient will continue his current treatment with Eliquis. The patient was advised to call immediately if he has any concerning symptoms in the interval. The patient voices understanding of current disease status and treatment options and is in agreement with the current care plan. All questions were answered. The patient knows to call the clinic with any problems, questions or concerns. We can certainly see the patient much sooner if necessary.  Disclaimer: This note was dictated with voice recognition software. Similar sounding words can inadvertently be transcribed  and may not be corrected upon review.

## 2019-07-20 NOTE — Progress Notes (Signed)
Pharmacist Chemotherapy Monitoring - Follow Up Assessment    I verify that I have reviewed each item in the below checklist:  . Regimen for the patient is scheduled for the appropriate day and plan matches scheduled date. Marland Kitchen Appropriate non-routine labs are ordered dependent on drug ordered. . If applicable, additional medications reviewed and ordered per protocol based on lifetime cumulative doses and/or treatment regimen.   Plan for follow-up and/or issues identified: No . I-vent associated with next due treatment: No . MD and/or nursing notified: No  Philomena Course 07/20/2019 8:21 AM

## 2019-07-26 ENCOUNTER — Inpatient Hospital Stay: Payer: Medicare HMO

## 2019-07-26 ENCOUNTER — Other Ambulatory Visit: Payer: Self-pay | Admitting: Internal Medicine

## 2019-07-26 ENCOUNTER — Encounter: Payer: Self-pay | Admitting: Internal Medicine

## 2019-07-26 ENCOUNTER — Other Ambulatory Visit: Payer: Self-pay

## 2019-07-26 ENCOUNTER — Inpatient Hospital Stay (HOSPITAL_BASED_OUTPATIENT_CLINIC_OR_DEPARTMENT_OTHER): Payer: Medicare HMO | Admitting: Internal Medicine

## 2019-07-26 VITALS — BP 129/73

## 2019-07-26 VITALS — BP 131/93 | HR 78 | Temp 97.8°F | Resp 18 | Ht 73.0 in | Wt 185.3 lb

## 2019-07-26 DIAGNOSIS — C3491 Malignant neoplasm of unspecified part of right bronchus or lung: Secondary | ICD-10-CM

## 2019-07-26 DIAGNOSIS — Z5112 Encounter for antineoplastic immunotherapy: Secondary | ICD-10-CM | POA: Diagnosis not present

## 2019-07-26 DIAGNOSIS — I4891 Unspecified atrial fibrillation: Secondary | ICD-10-CM | POA: Diagnosis not present

## 2019-07-26 DIAGNOSIS — C3431 Malignant neoplasm of lower lobe, right bronchus or lung: Secondary | ICD-10-CM

## 2019-07-26 DIAGNOSIS — I10 Essential (primary) hypertension: Secondary | ICD-10-CM | POA: Diagnosis not present

## 2019-07-26 LAB — CBC WITH DIFFERENTIAL (CANCER CENTER ONLY)
Abs Immature Granulocytes: 0.03 10*3/uL (ref 0.00–0.07)
Basophils Absolute: 0.1 10*3/uL (ref 0.0–0.1)
Basophils Relative: 2 %
Eosinophils Absolute: 0.1 10*3/uL (ref 0.0–0.5)
Eosinophils Relative: 2 %
HCT: 42.9 % (ref 39.0–52.0)
Hemoglobin: 14.8 g/dL (ref 13.0–17.0)
Immature Granulocytes: 1 %
Lymphocytes Relative: 26 %
Lymphs Abs: 1.4 10*3/uL (ref 0.7–4.0)
MCH: 38.6 pg — ABNORMAL HIGH (ref 26.0–34.0)
MCHC: 34.5 g/dL (ref 30.0–36.0)
MCV: 112 fL — ABNORMAL HIGH (ref 80.0–100.0)
Monocytes Absolute: 0.5 10*3/uL (ref 0.1–1.0)
Monocytes Relative: 10 %
Neutro Abs: 3 10*3/uL (ref 1.7–7.7)
Neutrophils Relative %: 59 %
Platelet Count: 190 10*3/uL (ref 150–400)
RBC: 3.83 MIL/uL — ABNORMAL LOW (ref 4.22–5.81)
RDW: 15 % (ref 11.5–15.5)
WBC Count: 5.2 10*3/uL (ref 4.0–10.5)
nRBC: 0 % (ref 0.0–0.2)

## 2019-07-26 LAB — CMP (CANCER CENTER ONLY)
ALT: 18 U/L (ref 0–44)
AST: 23 U/L (ref 15–41)
Albumin: 3.7 g/dL (ref 3.5–5.0)
Alkaline Phosphatase: 91 U/L (ref 38–126)
Anion gap: 11 (ref 5–15)
BUN: 9 mg/dL (ref 8–23)
CO2: 23 mmol/L (ref 22–32)
Calcium: 9.2 mg/dL (ref 8.9–10.3)
Chloride: 103 mmol/L (ref 98–111)
Creatinine: 0.83 mg/dL (ref 0.61–1.24)
GFR, Est AFR Am: >60 mL/min (ref >60)
GFR, Estimated: >60 mL/min (ref >60)
Glucose, Bld: 85 mg/dL (ref 70–99)
Potassium: 4.7 mmol/L (ref 3.5–5.1)
Sodium: 137 mmol/L (ref 135–145)
Total Bilirubin: 1.1 mg/dL (ref 0.3–1.2)
Total Protein: 7.2 g/dL (ref 6.5–8.1)

## 2019-07-26 LAB — TSH: TSH: 10.473 u[IU]/mL — ABNORMAL HIGH (ref 0.320–4.118)

## 2019-07-26 MED ORDER — SODIUM CHLORIDE 0.9 % IV SOLN
10.0000 mg/kg | Freq: Once | INTRAVENOUS | Status: AC
  Administered 2019-07-26: 860 mg via INTRAVENOUS
  Filled 2019-07-26: qty 10

## 2019-07-26 MED ORDER — LEVOTHYROXINE SODIUM 50 MCG PO TABS: 50.0000 ug | ORAL_TABLET | Freq: Every day | ORAL | 1 refills | Status: DC

## 2019-07-26 MED ORDER — SODIUM CHLORIDE 0.9 % IV SOLN
Freq: Once | INTRAVENOUS | Status: AC
  Filled 2019-07-26: qty 250

## 2019-07-26 NOTE — Progress Notes (Signed)
[name] [phone]   Fax:(336) [phone]  OFFICE PROGRESS NOTE  [name], MD [place] [address]  DIAGNOSIS: Stage IIIA (T3, N1, M0) non-small cell lung cancer, squamous cell carcinoma diagnosed in July 2020 and presented with right middle lobe mass and right hilar adenopathy with postobstructive pneumonia and suspicious right pleural effusion.  PRIOR THERAPY:  Weekly concurrent chemoradiation with Carboplatin for an AUC of 2 and paclitaxel 45 mg/m2. First dose 11/23/2018. Status post 7 cycles.   CURRENT THERAPY: Consolidation immunotherapy with Imfinzi 10 mg/KG every 2 weeks.  First dose February 08, 2019.  Status post 12 cycles.  INTERVAL HISTORY: Joe Cole 69 y.o. male returns to the clinic today for follow-up visit.  The patient is feeling fine today with no concerning complaints.  He denied having any chest pain, shortness of breath, cough or hemoptysis.  The patient denied having any fever or chills.  He has no nausea, vomiting, diarrhea or constipation.  He has no headache or visual changes.  He continues to tolerate his consolidation treatment with Imfinzi fairly well.  The patient was supposed to have repeat CT scan of the chest before this visit but unfortunately it is not scheduled to be done in 2 days.  He is here today for evaluation before starting cycle #13.  MEDICAL HISTORY: Past Medical History:  Diagnosis Date  . Arthritis   . Atrial fibrillation ([place]) 10/2018  . COPD with emphysema ([place])   . Dyspnea   . Hypertension   . lung ca dx'd 10/2018    ALLERGIES:  has No Known Allergies.  MEDICATIONS:  Current Outpatient Medications  Medication Sig Dispense Refill  . acetaminophen (TYLENOL) 500 MG tablet Take 1,000 mg by mouth every 6 (six) hours as needed for moderate pain or headache.    . albuterol (PROAIR HFA) 108 (90 Base) MCG/ACT inhaler Inhale 2 puffs into the lungs every 6 (six) hours  as needed for wheezing or shortness of breath. 1 Inhaler 5  . apixaban (ELIQUIS) 5 MG TABS tablet Take 1 tablet (5 mg total) by mouth 2 (two) times daily. 60 tablet 9  . atorvastatin (LIPITOR) 20 MG tablet Take 20 mg by mouth daily.    Marland Kitchen diltiazem (CARDIZEM CD) 360 MG 24 hr capsule Take 1 capsule (360 mg total) by mouth daily. 90 capsule 2  . metoprolol succinate (TOPROL XL) 25 MG 24 hr tablet Take 1 tablet (25 mg total) by mouth at bedtime. 30 tablet 11  . metoprolol succinate (TOPROL XL) 50 MG 24 hr tablet Take 1 tablet (50 mg total) by mouth at bedtime. Take with or immediately following a meal. 30 tablet 11  . Probiotic Product (ALIGN) 4 MG CAPS Take 4 mg by mouth daily.    . prochlorperazine (COMPAZINE) 10 MG tablet Take 1 tablet (10 mg total) by mouth every 6 (six) hours as needed for nausea or vomiting. 30 tablet 0  . traZODone (DESYREL) 50 MG tablet TAKE 1 TABLET BY MOUTH EVERYDAY AT BEDTIME 30 tablet 0  . umeclidinium-vilanterol (ANORO ELLIPTA) 62.5-25 MCG/INH AEPB Inhale 1 puff into the lungs daily. 1 each 6   No current facility-administered medications for this visit.    SURGICAL HISTORY:  Past Surgical History:  Procedure Laterality Date  . CARDIOVERSION N/A 01/11/2019   Procedure: CARDIOVERSION;  Surgeon: Josue Hector, MD;  Location: [facility] ENDOSCOPY;  Service: Cardiovascular;  Laterality: N/A;  . KNEE ARTHROSCOPY  BIL   . TOE SURGERY     PINNED LEFT BIG TOE  . TONSILLECTOMY     T+A  AS CHILD  . TOTAL KNEE ARTHROPLASTY Left 08/19/2017   Procedure: TOTAL KNEE ARTHROPLASTY;  Surgeon: Renette Butters, MD;  Location: [place];  Service: Orthopedics;  Laterality: Left;  Marland Kitchen VIDEO BRONCHOSCOPY Bilateral 11/05/2018   Procedure: VIDEO BRONCHOSCOPY WITH FLUORO;  Surgeon: [name], MD;  Location: [facility] ENDOSCOPY;  Service: Cardiopulmonary;  Laterality: Bilateral;  . VIDEO BRONCHOSCOPY Bilateral 11/09/2018   Procedure: VIDEO BRONCHOSCOPY WITH FLUORO;  Surgeon: Rigoberto Noel, MD;   Location: [place];  Service: Cardiopulmonary;  Laterality: Bilateral;    REVIEW OF SYSTEMS:  A comprehensive review of systems was negative.   PHYSICAL EXAMINATION: General appearance: alert, cooperative and no distress Head: Normocephalic, without obvious abnormality, atraumatic Neck: no adenopathy, no JVD, supple, symmetrical, trachea midline and thyroid not enlarged, symmetric, no tenderness/mass/nodules Lymph nodes: Cervical, supraclavicular, and axillary nodes normal. Resp: clear to auscultation bilaterally Back: symmetric, no curvature. ROM normal. No CVA tenderness. Cardio: regular rate and rhythm, S1, S2 normal, no murmur, click, rub or gallop GI: soft, non-tender; bowel sounds normal; no masses,  no organomegaly Extremities: extremities normal, atraumatic, no cyanosis or edema  ECOG PERFORMANCE STATUS: 1 - Symptomatic but completely ambulatory  Blood pressure (!) 131/93, pulse 78, temperature 97.8 F (36.6 C), temperature source Oral, resp. rate 18, height 6\' 1"  (1.854 m), weight 185 lb 4.8 oz (84.1 kg), SpO2 100 %.  LABORATORY DATA: Lab Results  Component Value Date   WBC 5.2 07/26/2019   HGB 14.8 07/26/2019   HCT 42.9 07/26/2019   MCV 112.0 (H) 07/26/2019   PLT 190 07/26/2019      Chemistry      Component Value Date/Time   NA 136 07/12/2019 0942   K 4.6 07/12/2019 0942   CL 103 07/12/2019 0942   CO2 25 07/12/2019 0942   BUN 8 07/12/2019 0942   CREATININE 0.75 07/12/2019 0942      Component Value Date/Time   CALCIUM 8.9 07/12/2019 0942   ALKPHOS 91 07/12/2019 0942   AST 27 07/12/2019 0942   ALT 24 07/12/2019 0942   BILITOT 0.9 07/12/2019 0942       RADIOGRAPHIC STUDIES: No results found.  ASSESSMENT AND PLAN: This is a very pleasant 69 years old white male with stage IIIA non-small cell lung cancer, squamous cell carcinoma.  The patient underwent a course of concurrent chemoradiation with weekly carboplatin and paclitaxel status post 7 cycles.  The  patient tolerated his treatment well except for mild fatigue and odynophagia. He is currently undergoing consolidation treatment with immunotherapy with Imfinzi status post 12 cycles.   He has been tolerating the treatment well with no concerning complaints. I recommended for him to proceed with cycle #13 today as planned. He has scan scheduled to be done in 2 days and I will discuss results with him next visit. He was advised to call immediately if he has any concerning symptoms in the interval. For the atrial fibrillation, the patient will continue his current treatment with Eliquis.  The patient voices understanding of current disease status and treatment options and is in agreement with the current care plan. All questions were answered. The patient knows to call the clinic with any problems, questions or concerns. We can certainly see the patient much sooner if necessary.  Disclaimer: This note was dictated with voice recognition software. Similar sounding words can inadvertently be transcribed and may  not be corrected upon review.

## 2019-07-26 NOTE — Patient Instructions (Addendum)
[facility] Discharge Instructions for Patients Receiving Chemotherapy  Today you received the following Immunotherapy agent: Durvalumab (Imfinzi)  To help prevent nausea and vomiting after your treatment, we encourage you to take your nausea medication as directed by your MD.   If you develop nausea and vomiting that is not controlled by your nausea medication, call the clinic.   BELOW ARE SYMPTOMS THAT SHOULD BE REPORTED IMMEDIATELY:  *FEVER GREATER THAN 100.5 F  *CHILLS WITH OR WITHOUT FEVER  NAUSEA AND VOMITING THAT IS NOT CONTROLLED WITH YOUR NAUSEA MEDICATION  *UNUSUAL SHORTNESS OF BREATH  *UNUSUAL BRUISING OR BLEEDING  TENDERNESS IN MOUTH AND THROAT WITH OR WITHOUT PRESENCE OF ULCERS  *URINARY PROBLEMS  *BOWEL PROBLEMS  UNUSUAL RASH Items with * indicate a potential emergency and should be followed up as soon as possible.  Feel free to call the clinic should you have any questions or concerns. The clinic phone number is [phone].  Please show the [place] at check-in to the Emergency Department and triage nurse.  Levothyroxine oral capsules What is this medicine? LEVOTHYROXINE (lee voe thye ROX een) is a thyroid hormone. This medicine can improve symptoms of thyroid deficiency such as slow speech, lack of energy, weight gain, hair loss, dry skin, and feeling cold. It also helps to treat goiter (an enlarged thyroid gland). It is also used to treat some kinds of thyroid cancer along with surgery and other medicines. This medicine may be used for other purposes; ask your health care provider or pharmacist if you have questions. COMMON BRAND NAME(S): Tirosint What should I tell my health care provider before I take this medicine? They need to know if you have any of these conditions:  Addison's disease or other adrenal gland problem  angina  bone problems  diabetes  dieting or on a weight loss program  fertility problems  heart  disease  pituitary gland problem  take medicines that treat or prevent blood clots  an unusual or allergic reaction to levothyroxine, thyroid hormones, other medicines, foods, dyes, or preservatives  pregnant or trying to get pregnant  breast-feeding How should I use this medicine? Take this medicine by mouth with a glass of water. It is best to take on an empty stomach, at least 30 minutes to one hour before breakfast. Avoid taking antacids containing aluminum or magnesium, simethicone, bile acid sequestrants, calcium carbonate, sodium polystyrene sulfonate, ferrous sulfate, sevelamer, lanthanum, or sucralfate within 4 hours of taking this medicine. Do not cut, crush or chew this medicine. Follow the directions on the prescription label. Take at the same time each day. Do not take your medicine more often than directed. Talk to your pediatrician regarding the use of this medicine in children. While this drug may be prescribed for selected conditions, precautions do apply. Since the capsules cannot be crushed or placed in water, they may only be given to infants and children who are able to swallow an intact capsule. Overdosage: If you think you have taken too much of this medicine contact a poison control center or emergency room at once. NOTE: This medicine is only for you. Do not share this medicine with others. What if I miss a dose? If you miss a dose, take it as soon as you can. If it is almost time for your next dose, take only that dose. Do not take double or extra doses. What may interact with this medicine?  amiodarone  antacids  anti-thyroid medicines  calcium supplements  carbamazepine  certain medicines for depression  certain medicines to treat cancer  cholestyramine  clofibrate  colesevelam  colestipol  digoxin  male hormones, like estrogens and birth control pills, patches, rings, or injections  iron  supplements  glyoxylate  ketamine  lanthanum  liquid nutrition products like Ensure  lithium  medicines for colds and breathing difficulties  medicines for diabetes  medicines or dietary supplements for weight loss  methadone  niacin  orlistat  oxandrolone  phenobarbital or other barbiturates  phenytoin  rifampin  sevelamer  simethicone  sodium polystyrene sulfonate  soy isoflavones  steroid medicines like prednisone or cortisone  sucralfate  testosterone  theophylline  warfarin This list may not describe all possible interactions. Give your health care provider a list of all the medicines, herbs, non-prescription drugs, or dietary supplements you use. Also tell them if you smoke, drink alcohol, or use illegal drugs. Some items may interact with your medicine. What should I watch for while using this medicine? Do not switch brands of this medicine unless your health care professional agrees with the change. Ask questions if you are uncertain. You will need regular exams and occasional blood tests to check the response to treatment. If you are receiving this medicine for an underactive thyroid, it may be several weeks before you notice an improvement. Check with your doctor or health care professional if your symptoms do not improve. It may be necessary for you to take this medicine for the rest of your life. Do not stop using this medicine unless your doctor or health care professional advises you to. This medicine can affect blood sugar levels. If you have diabetes, check your blood sugar as directed. You may lose some of your hair when you first start treatment. With time, this usually corrects itself. If you are going to have surgery, tell your doctor or health care professional that you are taking this medicine. What side effects may I notice from receiving this medicine? Side effects that you should report to your doctor or health care professional as  soon as possible:  allergic reactions like skin rash, itching or hives, swelling of the face, lips, or tongue  anxious  breathing problems  changes in menstrual periods  chest pain  diarrhea  excessive sweating or intolerance to heat  fast or irregular heartbeat  leg cramps  nervousness  swelling of ankles, feet, or legs  tremors  trouble sleeping  vomiting Side effects that usually do not require medical attention (report to your doctor or health care professional if they continue or are bothersome):  changes in appetite  headache  irritable  nausea  weight loss This list may not describe all possible side effects. Call your doctor for medical advice about side effects. You may report side effects to FDA at 1-800-FDA-1088. Where should I keep my medicine? Keep out of the reach of children. Store at room temperature between 15 and 30 degrees C (59 and 86 degrees F). Protect from light and moisture. Keep container tightly closed. Throw away any unused medicine after the expiration date. NOTE: This sheet is a summary. It may not cover all possible information. If you have questions about this medicine, talk to your doctor, pharmacist, or health care provider.  2020 Elsevier/Gold Standard (2018-11-20 14:57:53)   Coronavirus (COVID-19) Are you at risk?  Are you at risk for the Coronavirus (COVID-19)?  To be considered HIGH RISK for Coronavirus (COVID-19), you have to meet the following criteria:  . Traveled to [place],  [place], [place], [place] or [place]; or in the [place] to [place], [place], [place], or [place]; and have fever, cough, and shortness of breath within the last 2 weeks of travel OR . Been in close contact with a person diagnosed with COVID-19 within the last 2 weeks and have fever, cough, and shortness of breath . IF YOU DO NOT MEET THESE CRITERIA, YOU ARE CONSIDERED LOW RISK FOR COVID-19.  What to do if you are HIGH RISK for  COVID-19?  Marland Kitchen If you are having a medical emergency, call 911. . Seek medical care right away. Before you go to a doctor's office, urgent care or emergency department, call ahead and tell them about your recent travel, contact with someone diagnosed with COVID-19, and your symptoms. You should receive instructions from your physician's office regarding next steps of care.  . When you arrive at healthcare provider, tell the healthcare staff immediately you have returned from visiting [place], [place], [place], [place] or [place]; or traveled in the [place] to [place], [place], [place], or [place]; in the last two weeks or you have been in close contact with a person diagnosed with COVID-19 in the last 2 weeks.   . Tell the health care staff about your symptoms: fever, cough and shortness of breath. . After you have been seen by a medical provider, you will be either: o Tested for (COVID-19) and discharged home on quarantine except to seek medical care if symptoms worsen, and asked to  - Stay home and avoid contact with others until you get your results (4-5 days)  - Avoid travel on public transportation if possible (such as bus, train, or airplane) or o Sent to the Emergency Department by EMS for evaluation, COVID-19 testing, and possible admission depending on your condition and test results.  What to do if you are LOW RISK for COVID-19?  Reduce your risk of any infection by using the same precautions used for avoiding the common cold or flu:  Marland Kitchen Wash your hands often with soap and warm water for at least 20 seconds.  If soap and water are not readily available, use an alcohol-based hand sanitizer with at least 60% alcohol.  . If coughing or sneezing, cover your mouth and nose by coughing or sneezing into the elbow areas of your shirt or coat, into a tissue or into your sleeve (not your hands). . Avoid shaking hands with others and consider head nods or verbal greetings only. . Avoid  touching your eyes, nose, or mouth with unwashed hands.  . Avoid close contact with people who are sick. . Avoid places or events with large numbers of people in one location, like concerts or sporting events. . Carefully consider travel plans you have or are making. . If you are planning any travel outside or inside the [place], visit the CDC's Travelers' Health webpage for the latest health notices. . If you have some symptoms but not all symptoms, continue to monitor at home and seek medical attention if your symptoms worsen. . If you are having a medical emergency, call 911.   [place] / e-Visit: [email or web address]         [facility]: [facility]: [phone]                   [facility]: [phone]

## 2019-07-27 ENCOUNTER — Telehealth: Payer: Self-pay | Admitting: Internal Medicine

## 2019-07-27 NOTE — Telephone Encounter (Signed)
Scheduled per los. Called and left msg. Mailed printout  °

## 2019-07-28 ENCOUNTER — Other Ambulatory Visit: Payer: Self-pay

## 2019-07-28 ENCOUNTER — Encounter (HOSPITAL_COMMUNITY): Payer: Self-pay

## 2019-07-28 ENCOUNTER — Ambulatory Visit (HOSPITAL_COMMUNITY)
Admission: RE | Admit: 2019-07-28 | Discharge: 2019-07-28 | Disposition: A | Discharge status: Home / Self Care | Payer: Medicare HMO | Source: Ambulatory Visit | Attending: Internal Medicine | Admitting: Internal Medicine

## 2019-07-28 DIAGNOSIS — C3490 Malignant neoplasm of unspecified part of unspecified bronchus or lung: Secondary | ICD-10-CM | POA: Diagnosis not present

## 2019-07-28 MED ORDER — IOHEXOL 300 MG/ML  SOLN
75.0000 mL | Freq: Once | INTRAMUSCULAR | Status: AC | PRN
  Administered 2019-07-28: 75 mL via INTRAVENOUS

## 2019-07-28 MED ORDER — SODIUM CHLORIDE (PF) 0.9 % IJ SOLN
INTRAMUSCULAR | Status: AC
  Filled 2019-07-28: qty 50

## 2019-08-03 ENCOUNTER — Other Ambulatory Visit: Payer: Self-pay | Admitting: Pulmonary Disease

## 2019-08-03 NOTE — Progress Notes (Signed)
Pharmacist Chemotherapy Monitoring - Follow Up Assessment    I verify that I have reviewed each item in the below checklist:  . Regimen for the patient is scheduled for the appropriate day and plan matches scheduled date. Marland Kitchen Appropriate non-routine labs are ordered dependent on drug ordered. . If applicable, additional medications reviewed and ordered per protocol based on lifetime cumulative doses and/or treatment regimen.   Plan for follow-up and/or issues identified: No . I-vent associated with next due treatment: No . MD and/or nursing notified: No  .   Joe Cole D 08/03/2019 4:23 PM

## 2019-08-04 NOTE — Telephone Encounter (Signed)
Med last filled for pt 07/12/19. Pt is requesting a 90-day supply. Dr. Halford Chessman, please advise if you are fine changing pt's rx to a 90-day supply.

## 2019-08-09 ENCOUNTER — Inpatient Hospital Stay: Payer: Medicare HMO | Attending: Internal Medicine | Admitting: Internal Medicine

## 2019-08-09 ENCOUNTER — Encounter: Payer: Self-pay | Admitting: Internal Medicine

## 2019-08-09 ENCOUNTER — Other Ambulatory Visit: Payer: Self-pay

## 2019-08-09 ENCOUNTER — Inpatient Hospital Stay: Payer: Medicare HMO

## 2019-08-09 VITALS — BP 115/79 | HR 71 | Temp 98.5°F | Resp 17 | Ht 73.0 in | Wt 185.2 lb

## 2019-08-09 DIAGNOSIS — Z5112 Encounter for antineoplastic immunotherapy: Secondary | ICD-10-CM | POA: Diagnosis not present

## 2019-08-09 DIAGNOSIS — I10 Essential (primary) hypertension: Secondary | ICD-10-CM

## 2019-08-09 DIAGNOSIS — C3491 Malignant neoplasm of unspecified part of right bronchus or lung: Secondary | ICD-10-CM | POA: Diagnosis not present

## 2019-08-09 DIAGNOSIS — C342 Malignant neoplasm of middle lobe, bronchus or lung: Secondary | ICD-10-CM | POA: Insufficient documentation

## 2019-08-09 DIAGNOSIS — C3431 Malignant neoplasm of lower lobe, right bronchus or lung: Secondary | ICD-10-CM

## 2019-08-09 DIAGNOSIS — Z79899 Other long term (current) drug therapy: Secondary | ICD-10-CM | POA: Insufficient documentation

## 2019-08-09 DIAGNOSIS — I4891 Unspecified atrial fibrillation: Secondary | ICD-10-CM

## 2019-08-09 LAB — CMP (CANCER CENTER ONLY)
ALT: 21 U/L (ref 0–44)
AST: 26 U/L (ref 15–41)
Albumin: 3.7 g/dL (ref 3.5–5.0)
Alkaline Phosphatase: 90 U/L (ref 38–126)
Anion gap: 10 (ref 5–15)
BUN: 11 mg/dL (ref 8–23)
CO2: 22 mmol/L (ref 22–32)
Calcium: 9 mg/dL (ref 8.9–10.3)
Chloride: 104 mmol/L (ref 98–111)
Creatinine: 0.83 mg/dL (ref 0.61–1.24)
GFR, Est AFR Am: >60 mL/min (ref >60)
GFR, Estimated: >60 mL/min (ref >60)
Glucose, Bld: 130 mg/dL — ABNORMAL HIGH (ref 70–99)
Potassium: 4.4 mmol/L (ref 3.5–5.1)
Sodium: 136 mmol/L (ref 135–145)
Total Bilirubin: 0.7 mg/dL (ref 0.3–1.2)
Total Protein: 7.2 g/dL (ref 6.5–8.1)

## 2019-08-09 LAB — CBC WITH DIFFERENTIAL (CANCER CENTER ONLY)
Abs Immature Granulocytes: 0.02 10*3/uL (ref 0.00–0.07)
Basophils Absolute: 0.1 10*3/uL (ref 0.0–0.1)
Basophils Relative: 1 %
Eosinophils Absolute: 0.1 10*3/uL (ref 0.0–0.5)
Eosinophils Relative: 2 %
HCT: 43.3 % (ref 39.0–52.0)
Hemoglobin: 15 g/dL (ref 13.0–17.0)
Immature Granulocytes: 0 %
Lymphocytes Relative: 19 %
Lymphs Abs: 1.2 10*3/uL (ref 0.7–4.0)
MCH: 38.1 pg — ABNORMAL HIGH (ref 26.0–34.0)
MCHC: 34.6 g/dL (ref 30.0–36.0)
MCV: 109.9 fL — ABNORMAL HIGH (ref 80.0–100.0)
Monocytes Absolute: 0.4 10*3/uL (ref 0.1–1.0)
Monocytes Relative: 7 %
Neutro Abs: 4.6 10*3/uL (ref 1.7–7.7)
Neutrophils Relative %: 71 %
Platelet Count: 195 10*3/uL (ref 150–400)
RBC: 3.94 MIL/uL — ABNORMAL LOW (ref 4.22–5.81)
RDW: 14.9 % (ref 11.5–15.5)
WBC Count: 6.5 10*3/uL (ref 4.0–10.5)
nRBC: 0 % (ref 0.0–0.2)

## 2019-08-09 MED ORDER — SODIUM CHLORIDE 0.9 % IV SOLN
10.0000 mg/kg | Freq: Once | INTRAVENOUS | Status: AC
  Administered 2019-08-09: 860 mg via INTRAVENOUS
  Filled 2019-08-09: qty 10

## 2019-08-09 MED ORDER — SODIUM CHLORIDE 0.9 % IV SOLN
Freq: Once | INTRAVENOUS | Status: AC
  Filled 2019-08-09: qty 250

## 2019-08-09 NOTE — Patient Instructions (Signed)
[facility] Discharge Instructions for Patients Receiving Chemotherapy  Today you received the following chemotherapy agents: durvalumab.  To help prevent nausea and vomiting after your treatment, we encourage you to take your nausea medication as directed.   If you develop nausea and vomiting that is not controlled by your nausea medication, call the clinic.   BELOW ARE SYMPTOMS THAT SHOULD BE REPORTED IMMEDIATELY:  *FEVER GREATER THAN 100.5 F  *CHILLS WITH OR WITHOUT FEVER  NAUSEA AND VOMITING THAT IS NOT CONTROLLED WITH YOUR NAUSEA MEDICATION  *UNUSUAL SHORTNESS OF BREATH  *UNUSUAL BRUISING OR BLEEDING  TENDERNESS IN MOUTH AND THROAT WITH OR WITHOUT PRESENCE OF ULCERS  *URINARY PROBLEMS  *BOWEL PROBLEMS  UNUSUAL RASH Items with * indicate a potential emergency and should be followed up as soon as possible.  Feel free to call the clinic should you have any questions or concerns. The clinic phone number is [phone].  Please show the CHEMO ALERT CARD at check-in to the Emergency Department and triage nurse.   

## 2019-08-09 NOTE — Progress Notes (Signed)
[name] [phone]   [phone]  OFFICE PROGRESS NOTE  [place] Frees, MD [place] [address]  DIAGNOSIS: Stage IIIA (T3, N1, M0) non-small cell lung cancer, squamous cell carcinoma diagnosed in July 2020 and presented with right middle lobe mass and right hilar adenopathy with postobstructive pneumonia and suspicious right pleural effusion.  PRIOR THERAPY:  Weekly concurrent chemoradiation with Carboplatin for an AUC of 2 and paclitaxel 45 mg/m2. First dose 11/23/2018. Status post 7 cycles.   CURRENT THERAPY: Consolidation immunotherapy with Imfinzi 10 mg/KG every 2 weeks.  First dose February 08, 2019.  Status post 13 cycles.  INTERVAL HISTORY: Joe Cole 69 y.o. male returns to the clinic today for follow-up visit.  The patient is feeling fine today with no concerning complaints except for mild cough.  He denied having any chest pain, shortness of breath or hemoptysis.  He denied having any nausea, vomiting, diarrhea or constipation.  He has no headache or visual changes.  He denied having any recent weight loss or night sweats.  He continues to tolerate his treatment with consolidation Imfinzi fairly well.  The patient had repeat CT scan of the chest performed recently and he is here for evaluation and discussion of his scan results.  MEDICAL HISTORY: Past Medical History:  Diagnosis Date  . Arthritis   . Atrial fibrillation ([place]) 10/2018  . COPD with emphysema ([place])   . Dyspnea   . Hypertension   . lung ca dx'd 10/2018    ALLERGIES:  has No Known Allergies.  MEDICATIONS:  Current Outpatient Medications  Medication Sig Dispense Refill  . acetaminophen (TYLENOL) 500 MG tablet Take 1,000 mg by mouth every 6 (six) hours as needed for moderate pain or headache.    . albuterol (PROAIR HFA) 108 (90 Base) MCG/ACT inhaler Inhale 2 puffs into the lungs every 6 (six) hours as needed for wheezing or shortness  of breath. 1 Inhaler 5  . apixaban (ELIQUIS) 5 MG TABS tablet Take 1 tablet (5 mg total) by mouth 2 (two) times daily. 60 tablet 9  . atorvastatin (LIPITOR) 20 MG tablet Take 20 mg by mouth daily.    Marland Kitchen diltiazem (CARDIZEM CD) 360 MG 24 hr capsule Take 1 capsule (360 mg total) by mouth daily. 90 capsule 2  . levothyroxine (SYNTHROID) 50 MCG tablet Take 1 tablet (50 mcg total) by mouth daily before breakfast. 30 tablet 1  . metoprolol succinate (TOPROL XL) 25 MG 24 hr tablet Take 1 tablet (25 mg total) by mouth at bedtime. 30 tablet 11  . metoprolol succinate (TOPROL XL) 50 MG 24 hr tablet Take 1 tablet (50 mg total) by mouth at bedtime. Take with or immediately following a meal. 30 tablet 11  . Probiotic Product (ALIGN) 4 MG CAPS Take 4 mg by mouth daily.    . prochlorperazine (COMPAZINE) 10 MG tablet Take 1 tablet (10 mg total) by mouth every 6 (six) hours as needed for nausea or vomiting. 30 tablet 0  . traZODone (DESYREL) 50 MG tablet TAKE 1 TABLET BY MOUTH EVERYDAY AT BEDTIME 90 tablet 1  . umeclidinium-vilanterol (ANORO ELLIPTA) 62.5-25 MCG/INH AEPB Inhale 1 puff into the lungs daily. 1 each 6   No current facility-administered medications for this visit.    SURGICAL HISTORY:  Past Surgical History:  Procedure Laterality Date  . CARDIOVERSION N/A 01/11/2019   Procedure: CARDIOVERSION;  Surgeon: Josue Hector, MD;  Location: [facility];  Service: Cardiovascular;  Laterality: N/A;  . KNEE ARTHROSCOPY     BIL   . TOE SURGERY     PINNED LEFT BIG TOE  . TONSILLECTOMY     T+A  AS CHILD  . TOTAL KNEE ARTHROPLASTY Left 08/19/2017   Procedure: TOTAL KNEE ARTHROPLASTY;  Surgeon: Renette Butters, MD;  Location: [facility];  Service: Orthopedics;  Laterality: Left;  Marland Kitchen VIDEO BRONCHOSCOPY Bilateral 11/05/2018   Procedure: VIDEO BRONCHOSCOPY WITH FLUORO;  Surgeon: [name], MD;  Location: [facility] END[facility]COPY;  Service: Cardiopulmonary;  Laterality: Bilateral;  . VIDEO BRONCHOSCOPY Bilateral 11/09/2018    Procedure: VIDEO BRONCHOSCOPY WITH FLUORO;  Surgeon: Rigoberto Noel, MD;  Location: [facility];  Service: Cardiopulmonary;  Laterality: Bilateral;    REVIEW OF SYSTEMS:  Constitutional: negative Eyes: negative Ears, nose, mouth, throat, and face: negative Respiratory: positive for cough Cardiovascular: negative Gastrointestinal: negative Genitourinary:negative Integument/breast: negative Hematologic/lymphatic: negative Musculoskeletal:negative Neurological: negative Behavioral/Psych: negative Endocrine: negative Allergic/Immunologic: negative   PHYSICAL EXAMINATION: General appearance: alert, cooperative and no distress Head: Normocephalic, without obvious abnormality, atraumatic Neck: no adenopathy, no JVD, supple, symmetrical, trachea midline and thyroid not enlarged, symmetric, no tenderness/mass/nodules Lymph nodes: Cervical, supraclavicular, and axillary nodes normal. Resp: clear to auscultation bilaterally Back: symmetric, no curvature. ROM normal. No CVA tenderness. Cardio: regular rate and rhythm, S1, S2 normal, no murmur, click, rub or gallop GI: soft, non-tender; bowel sounds normal; no masses,  no organomegaly Extremities: extremities normal, atraumatic, no cyanosis or edema Neurologic: Alert and oriented X 3, normal strength and tone. Normal symmetric reflexes. Normal coordination and gait  ECOG PERFORMANCE STATUS: 1 - Symptomatic but completely ambulatory  Blood pressure 115/79, pulse 71, temperature 98.5 F (36.9 C), temperature source Temporal, resp. rate 17, height 6\' 1"  (1.854 m), weight 185 lb 3.2 oz (84 kg), SpO2 98 %.  LABORATORY DATA: Lab Results  Component Value Date   WBC 6.5 08/09/2019   HGB 15.0 08/09/2019   HCT 43.3 08/09/2019   MCV 109.9 (H) 08/09/2019   PLT 195 08/09/2019      Chemistry      Component Value Date/Time   NA 137 07/26/2019 0942   K 4.7 07/26/2019 0942   CL 103 07/26/2019 0942   CO2 23 07/26/2019 0942   BUN 9 07/26/2019  0942   CREATININE 0.83 07/26/2019 0942      Component Value Date/Time   CALCIUM 9.2 07/26/2019 0942   ALKPHOS 91 07/26/2019 0942   AST 23 07/26/2019 0942   ALT 18 07/26/2019 0942   BILITOT 1.1 07/26/2019 0942       RADIOGRAPHIC STUDIES: CT Chest W Contrast  Result Date: 07/28/2019 CLINICAL DATA:  Primary Cancer Type: Lung Imaging Indication: Routine surveillance Interval therapy since last imaging? Yes - immunotherapy Initial Cancer Diagnosis Date: 11/09/2018 Established by: Biopsy-proven Detailed Pathology: Squamous cell lung carcinoma Primary Tumor location: Right perihilar Radiation therapy? Yes Target: Right perihilar Date Range: 11/19/2018-01/05/2019 EXAM: CT CHEST WITH CONTRAST TECHNIQUE: Multidetector CT imaging of the chest was performed during intravenous contrast administration. CONTRAST:  2mL OMNIPAQUE IOHEXOL 300 MG/ML  SOLN COMPARISON:  04/29/2019 chest CT. 10/29/2018 PET-CT. FINDINGS: Cardiovascular: Normal heart size. No significant pericardial effusion/thickening. Left anterior descending coronary atherosclerosis. Atherosclerotic nonaneurysmal thoracic aorta. Normal caliber pulmonary arteries. No central pulmonary emboli. Mediastinum/Nodes: No discrete thyroid nodules. Unremarkable esophagus. No pathologically enlarged axillary, mediastinal or hilar lymph nodes. Lungs/Pleura: No pneumothorax. Small dependent right pleural effusion, unchanged. No left pleural effusion. Mild centrilobular emphysema. Right perihilar sharply marginated consolidation with associated volume loss,  bronchiectasis and distortion, unchanged, compatible with postradiation change. No acute interval consolidative airspace disease. No new significant pulmonary nodules. Upper abdomen: No acute abnormality. Musculoskeletal: No aggressive appearing focal osseous lesions. Moderate thoracic spondylosis. IMPRESSION: 1. Stable posttreatment change in the parahilar right lung with no evidence of local tumor recurrence. 2.  No evidence of metastatic disease in the chest. 3. Stable small dependent right pleural effusion. 4. One vessel coronary atherosclerosis. 5. Aortic Atherosclerosis (ICD10-I70.0) and Emphysema (ICD10-J43.9). Electronically Signed   By: Ilona Sorrel M.D.   On: 07/28/2019 09:58    ASSESSMENT AND PLAN: This is a very pleasant 69 years old white male with stage IIIA non-small cell lung cancer, squamous cell carcinoma.  The patient underwent a course of concurrent chemoradiation with weekly carboplatin and paclitaxel status post 7 cycles.  The patient tolerated his treatment well except for mild fatigue and odynophagia. He is currently undergoing consolidation treatment with immunotherapy with Imfinzi status post 13 cycles.   The patient continues to tolerate this treatment well with no concerning adverse effects. He had repeat CT scan of the chest performed recently.  I personally and independently reviewed the scans and discussed the results with the patient today. His scan showed no concerning findings for disease progression. I recommended for the patient to continue his current treatment with Imfinzi and he will proceed with cycle #14 today as planned. I will see the patient back for follow-up visit in 2 weeks for evaluation before the next cycle of his treatment. For the history of atrial fibrillation, the patient will continue his current treatment with Eliquis. The patient was advised to call immediately if he has any concerning symptoms in the interval. The patient voices understanding of current disease status and treatment options and is in agreement with the current care plan. All questions were answered. The patient knows to call the clinic with any problems, questions or concerns. We can certainly see the patient much sooner if necessary.  Disclaimer: This note was dictated with voice recognition software. Similar sounding words can inadvertently be transcribed and may not be corrected upon  review.

## 2019-08-19 ENCOUNTER — Other Ambulatory Visit: Payer: Self-pay | Admitting: Internal Medicine

## 2019-08-24 ENCOUNTER — Inpatient Hospital Stay: Payer: Medicare HMO

## 2019-08-24 ENCOUNTER — Other Ambulatory Visit: Payer: Self-pay

## 2019-08-24 ENCOUNTER — Inpatient Hospital Stay (HOSPITAL_BASED_OUTPATIENT_CLINIC_OR_DEPARTMENT_OTHER): Payer: Medicare HMO | Admitting: Internal Medicine

## 2019-08-24 ENCOUNTER — Encounter: Payer: Self-pay | Admitting: Internal Medicine

## 2019-08-24 VITALS — BP 138/86 | HR 69 | Temp 98.3°F | Resp 17 | Ht 73.0 in | Wt 186.0 lb

## 2019-08-24 DIAGNOSIS — C3431 Malignant neoplasm of lower lobe, right bronchus or lung: Secondary | ICD-10-CM

## 2019-08-24 DIAGNOSIS — C3491 Malignant neoplasm of unspecified part of right bronchus or lung: Secondary | ICD-10-CM | POA: Diagnosis not present

## 2019-08-24 DIAGNOSIS — Z5112 Encounter for antineoplastic immunotherapy: Secondary | ICD-10-CM | POA: Diagnosis not present

## 2019-08-24 LAB — CMP (CANCER CENTER ONLY)
ALT: 25 U/L (ref 0–44)
AST: 27 U/L (ref 15–41)
Albumin: 3.6 g/dL (ref 3.5–5.0)
Alkaline Phosphatase: 88 U/L (ref 38–126)
Anion gap: 9 (ref 5–15)
BUN: 11 mg/dL (ref 8–23)
CO2: 23 mmol/L (ref 22–32)
Calcium: 9 mg/dL (ref 8.9–10.3)
Chloride: 101 mmol/L (ref 98–111)
Creatinine: 0.81 mg/dL (ref 0.61–1.24)
GFR, Est AFR Am: >60 mL/min (ref >60)
GFR, Estimated: >60 mL/min (ref >60)
Glucose, Bld: 108 mg/dL — ABNORMAL HIGH (ref 70–99)
Potassium: 4.8 mmol/L (ref 3.5–5.1)
Sodium: 133 mmol/L — ABNORMAL LOW (ref 135–145)
Total Bilirubin: 0.9 mg/dL (ref 0.3–1.2)
Total Protein: 6.9 g/dL (ref 6.5–8.1)

## 2019-08-24 LAB — CBC WITH DIFFERENTIAL (CANCER CENTER ONLY)
Abs Immature Granulocytes: 0.02 10*3/uL (ref 0.00–0.07)
Basophils Absolute: 0.1 10*3/uL (ref 0.0–0.1)
Basophils Relative: 1 %
Eosinophils Absolute: 0.1 10*3/uL (ref 0.0–0.5)
Eosinophils Relative: 2 %
HCT: 42 % (ref 39.0–52.0)
Hemoglobin: 14.6 g/dL (ref 13.0–17.0)
Immature Granulocytes: 0 %
Lymphocytes Relative: 26 %
Lymphs Abs: 1.2 10*3/uL (ref 0.7–4.0)
MCH: 38.7 pg — ABNORMAL HIGH (ref 26.0–34.0)
MCHC: 34.8 g/dL (ref 30.0–36.0)
MCV: 111.4 fL — ABNORMAL HIGH (ref 80.0–100.0)
Monocytes Absolute: 0.4 10*3/uL (ref 0.1–1.0)
Monocytes Relative: 9 %
Neutro Abs: 2.9 10*3/uL (ref 1.7–7.7)
Neutrophils Relative %: 62 %
Platelet Count: 172 10*3/uL (ref 150–400)
RBC: 3.77 MIL/uL — ABNORMAL LOW (ref 4.22–5.81)
RDW: 15.5 % (ref 11.5–15.5)
WBC Count: 4.7 10*3/uL (ref 4.0–10.5)
nRBC: 0 % (ref 0.0–0.2)

## 2019-08-24 LAB — TSH: TSH: 16.469 u[IU]/mL — ABNORMAL HIGH (ref 0.320–4.118)

## 2019-08-24 MED ORDER — LEVOTHYROXINE SODIUM 50 MCG PO TABS: 50.0000 ug | ORAL_TABLET | Freq: Every day | ORAL | 2 refills | Status: DC

## 2019-08-24 MED ORDER — SODIUM CHLORIDE 0.9 % IV SOLN
10.0000 mg/kg | Freq: Once | INTRAVENOUS | Status: AC
  Administered 2019-08-24: 860 mg via INTRAVENOUS
  Filled 2019-08-24: qty 7.2

## 2019-08-24 MED ORDER — LEVOTHYROXINE SODIUM 75 MCG PO TABS: 75.0000 ug | ORAL_TABLET | Freq: Every day | ORAL | 2 refills | Status: DC

## 2019-08-24 MED ORDER — SODIUM CHLORIDE 0.9 % IV SOLN
Freq: Once | INTRAVENOUS | Status: AC
  Filled 2019-08-24: qty 250

## 2019-08-24 NOTE — Progress Notes (Signed)
[place] [phone]   [phone]  OFFICE PROGRESS NOTE  [name], MD [place] [address]  DIAGNOSIS: Stage IIIA (T3, N1, M0) non-small cell lung cancer, squamous cell carcinoma diagnosed in July 2020 and presented with right middle lobe mass and right hilar adenopathy with postobstructive pneumonia and suspicious right pleural effusion.  PRIOR THERAPY:  Weekly concurrent chemoradiation with Carboplatin for an AUC of 2 and paclitaxel 45 mg/m2. First dose 11/23/2018. Status post 7 cycles.   CURRENT THERAPY: Consolidation immunotherapy with Imfinzi 10 mg/KG every 2 weeks.  First dose February 08, 2019.  Status post 14 cycles.  INTERVAL HISTORY: Joe Cole 69 y.o. male returns to the clinic today for follow-up visit.  The patient has no complaints today except for the shortness of breath with exertion but no significant chest pain, cough or hemoptysis.  He denied having any fever or chills.  He has no nausea, vomiting, diarrhea or constipation.  He denied having any weight loss or night sweats.  He has been tolerating his treatment with consolidation Imfinzi fairly well.  The patient is here today for evaluation before starting cycle #15.  MEDICAL HISTORY: Past Medical History:  Diagnosis Date  . Arthritis   . Atrial fibrillation ([place]) 10/2018  . COPD with emphysema ([place])   . Dyspnea   . Hypertension   . lung ca dx'd 10/2018    ALLERGIES:  has No Known Allergies.  MEDICATIONS:  Current Outpatient Medications  Medication Sig Dispense Refill  . acetaminophen (TYLENOL) 500 MG tablet Take 1,000 mg by mouth every 6 (six) hours as needed for moderate pain or headache.    . albuterol (PROAIR HFA) 108 (90 Base) MCG/ACT inhaler Inhale 2 puffs into the lungs every 6 (six) hours as needed for wheezing or shortness of breath. 1 Inhaler 5  . apixaban (ELIQUIS) 5 MG TABS tablet Take 1 tablet (5 mg total) by mouth  2 (two) times daily. 60 tablet 9  . atorvastatin (LIPITOR) 20 MG tablet Take 20 mg by mouth daily.    Marland Kitchen diltiazem (CARDIZEM CD) 360 MG 24 hr capsule Take 1 capsule (360 mg total) by mouth daily. 90 capsule 2  . levothyroxine (SYNTHROID) 50 MCG tablet TAKE 1 TABLET BY MOUTH DAILY BEFORE BREAKFAST 30 tablet 1  . metoprolol succinate (TOPROL XL) 25 MG 24 hr tablet Take 1 tablet (25 mg total) by mouth at bedtime. 30 tablet 11  . metoprolol succinate (TOPROL XL) 50 MG 24 hr tablet Take 1 tablet (50 mg total) by mouth at bedtime. Take with or immediately following a meal. 30 tablet 11  . Probiotic Product (ALIGN) 4 MG CAPS Take 4 mg by mouth daily.    . prochlorperazine (COMPAZINE) 10 MG tablet Take 1 tablet (10 mg total) by mouth every 6 (six) hours as needed for nausea or vomiting. 30 tablet 0  . traZODone (DESYREL) 50 MG tablet TAKE 1 TABLET BY MOUTH EVERYDAY AT BEDTIME 90 tablet 1  . umeclidinium-vilanterol (ANORO ELLIPTA) 62.5-25 MCG/INH AEPB Inhale 1 puff into the lungs daily. 1 each 6   No current facility-administered medications for this visit.    SURGICAL HISTORY:  Past Surgical History:  Procedure Laterality Date  . CARDIOVERSION N/A 01/11/2019   Procedure: CARDIOVERSION;  Surgeon: Josue Hector, MD;  Location: [facility] ENDOSCOPY;  Service: Cardiovascular;  Laterality: N/A;  . KNEE ARTHROSCOPY     BIL   . TOE SURGERY  PINNED LEFT BIG TOE  . TONSILLECTOMY     T+A  AS CHILD  . TOTAL KNEE ARTHROPLASTY Left 08/19/2017   Procedure: TOTAL KNEE ARTHROPLASTY;  Surgeon: Renette Butters, MD;  Location: [place];  Service: Orthopedics;  Laterality: Left;  Marland Kitchen VIDEO BRONCHOSCOPY Bilateral 11/05/2018   Procedure: VIDEO BRONCHOSCOPY WITH FLUORO;  Surgeon: [name], MD;  Location: [facility] ENDOSCOPY;  Service: Cardiopulmonary;  Laterality: Bilateral;  . VIDEO BRONCHOSCOPY Bilateral 11/09/2018   Procedure: VIDEO BRONCHOSCOPY WITH FLUORO;  Surgeon: Rigoberto Noel, MD;  Location: [place];  Service:  Cardiopulmonary;  Laterality: Bilateral;    REVIEW OF SYSTEMS:  Constitutional: positive for fatigue Eyes: negative Ears, nose, mouth, throat, and face: negative Respiratory: positive for dyspnea on exertion Cardiovascular: negative Gastrointestinal: negative Genitourinary:negative Integument/breast: negative Hematologic/lymphatic: negative Musculoskeletal:negative Neurological: negative Behavioral/Psych: negative Endocrine: negative Allergic/Immunologic: negative   PHYSICAL EXAMINATION: General appearance: alert, cooperative and no distress Head: Normocephalic, without obvious abnormality, atraumatic Neck: no adenopathy, no JVD, supple, symmetrical, trachea midline and thyroid not enlarged, symmetric, no tenderness/mass/nodules Lymph nodes: Cervical, supraclavicular, and axillary nodes normal. Resp: clear to auscultation bilaterally Back: symmetric, no curvature. ROM normal. No CVA tenderness. Cardio: regular rate and rhythm, S1, S2 normal, no murmur, click, rub or gallop GI: soft, non-tender; bowel sounds normal; no masses,  no organomegaly Extremities: extremities normal, atraumatic, no cyanosis or edema Neurologic: Alert and oriented X 3, normal strength and tone. Normal symmetric reflexes. Normal coordination and gait  ECOG PERFORMANCE STATUS: 1 - Symptomatic but completely ambulatory  Blood pressure 138/86, pulse 69, temperature 98.3 F (36.8 C), temperature source Temporal, resp. rate 17, height 6\' 1"  (1.854 m), weight 186 lb (84.4 kg), SpO2 (!) 87 %.  LABORATORY DATA: Lab Results  Component Value Date   WBC 4.7 08/24/2019   HGB 14.6 08/24/2019   HCT 42.0 08/24/2019   MCV 111.4 (H) 08/24/2019   PLT 172 08/24/2019      Chemistry      Component Value Date/Time   NA 133 (L) 08/24/2019 1149   K 4.8 08/24/2019 1149   CL 101 08/24/2019 1149   CO2 23 08/24/2019 1149   BUN 11 08/24/2019 1149   CREATININE 0.81 08/24/2019 1149      Component Value Date/Time    CALCIUM 9.0 08/24/2019 1149   ALKPHOS 88 08/24/2019 1149   AST 27 08/24/2019 1149   ALT 25 08/24/2019 1149   BILITOT 0.9 08/24/2019 1149       RADIOGRAPHIC STUDIES: CT Chest W Contrast  Result Date: 07/28/2019 CLINICAL DATA:  Primary Cancer Type: Lung Imaging Indication: Routine surveillance Interval therapy since last imaging? Yes - immunotherapy Initial Cancer Diagnosis Date: 11/09/2018 Established by: Biopsy-proven Detailed Pathology: Squamous cell lung carcinoma Primary Tumor location: Right perihilar Radiation therapy? Yes Target: Right perihilar Date Range: 11/19/2018-01/05/2019 EXAM: CT CHEST WITH CONTRAST TECHNIQUE: Multidetector CT imaging of the chest was performed during intravenous contrast administration. CONTRAST:  23mL OMNIPAQUE IOHEXOL 300 MG/ML  SOLN COMPARISON:  04/29/2019 chest CT. 10/29/2018 PET-CT. FINDINGS: Cardiovascular: Normal heart size. No significant pericardial effusion/thickening. Left anterior descending coronary atherosclerosis. Atherosclerotic nonaneurysmal thoracic aorta. Normal caliber pulmonary arteries. No central pulmonary emboli. Mediastinum/Nodes: No discrete thyroid nodules. Unremarkable esophagus. No pathologically enlarged axillary, mediastinal or hilar lymph nodes. Lungs/Pleura: No pneumothorax. Small dependent right pleural effusion, unchanged. No left pleural effusion. Mild centrilobular emphysema. Right perihilar sharply marginated consolidation with associated volume loss, bronchiectasis and distortion, unchanged, compatible with postradiation change. No acute interval consolidative airspace disease. No new significant pulmonary nodules.  Upper abdomen: No acute abnormality. Musculoskeletal: No aggressive appearing focal osseous lesions. Moderate thoracic spondylosis. IMPRESSION: 1. Stable posttreatment change in the parahilar right lung with no evidence of local tumor recurrence. 2. No evidence of metastatic disease in the chest. 3. Stable small dependent  right pleural effusion. 4. One vessel coronary atherosclerosis. 5. Aortic Atherosclerosis (ICD10-I70.0) and Emphysema (ICD10-J43.9). Electronically Signed   By: Ilona Sorrel M.D.   On: 07/28/2019 09:58    ASSESSMENT AND PLAN: This is a very pleasant 69 years old white male with stage IIIA non-small cell lung cancer, squamous cell carcinoma.  The patient underwent a course of concurrent chemoradiation with weekly carboplatin and paclitaxel status post 7 cycles.  The patient tolerated his treatment well except for mild fatigue and odynophagia. He is currently undergoing consolidation treatment with immunotherapy with Imfinzi status post 14 cycles.   The patient continues to tolerate his treatment well with no concerning adverse effects. I recommended for him to proceed with cycle #15 today as planned. For the hypothyroidism, this is likely secondary to the immunotherapy treatment.  I started the patient on levothyroxine 50 mcg p.o. daily. For the history of atrial fibrillation, he is currently on treatment with Eliquis. I will see him back for follow-up visit in 2 weeks for evaluation before starting the next cycle of his treatment. He was advised to call immediately if he has any concerning symptoms in the interval. The patient voices understanding of current disease status and treatment options and is in agreement with the current care plan. All questions were answered. The patient knows to call the clinic with any problems, questions or concerns. We can certainly see the patient much sooner if necessary.  Disclaimer: This note was dictated with voice recognition software. Similar sounding words can inadvertently be transcribed and may not be corrected upon review.

## 2019-08-24 NOTE — Patient Instructions (Signed)
[facility] Discharge Instructions for Patients Receiving Chemotherapy  Today you received the following chemotherapy agents: durvalumab.  To help prevent nausea and vomiting after your treatment, we encourage you to take your nausea medication as directed.   If you develop nausea and vomiting that is not controlled by your nausea medication, call the clinic.   BELOW ARE SYMPTOMS THAT SHOULD BE REPORTED IMMEDIATELY:  *FEVER GREATER THAN 100.5 F  *CHILLS WITH OR WITHOUT FEVER  NAUSEA AND VOMITING THAT IS NOT CONTROLLED WITH YOUR NAUSEA MEDICATION  *UNUSUAL SHORTNESS OF BREATH  *UNUSUAL BRUISING OR BLEEDING  TENDERNESS IN MOUTH AND THROAT WITH OR WITHOUT PRESENCE OF ULCERS  *URINARY PROBLEMS  *BOWEL PROBLEMS  UNUSUAL RASH Items with * indicate a potential emergency and should be followed up as soon as possible.  Feel free to call the clinic should you have any questions or concerns. The clinic phone number is [phone].  Please show the CHEMO ALERT CARD at check-in to the Emergency Department and triage nurse.   

## 2019-08-27 ENCOUNTER — Telehealth: Payer: Self-pay | Admitting: Internal Medicine

## 2019-08-27 NOTE — Telephone Encounter (Signed)
Scheduled per los. Called and left msg. Mailed printout  °

## 2019-08-31 NOTE — Progress Notes (Signed)
Pharmacist Chemotherapy Monitoring - Follow Up Assessment    I verify that I have reviewed each item in the below checklist:  . Regimen for the patient is scheduled for the appropriate day and plan matches scheduled date. Marland Kitchen Appropriate non-routine labs are ordered dependent on drug ordered. . If applicable, additional medications reviewed and ordered per protocol based on lifetime cumulative doses and/or treatment regimen.   Plan for follow-up and/or issues identified: No . I-vent associated with next due treatment: Yes . MD and/or nursing notified: No    Kennith Center, Pharm.D., CPP 08/31/2019@2 :53 PM

## 2019-09-06 ENCOUNTER — Other Ambulatory Visit: Payer: Self-pay

## 2019-09-06 ENCOUNTER — Inpatient Hospital Stay: Payer: Medicare HMO

## 2019-09-06 ENCOUNTER — Inpatient Hospital Stay: Payer: Medicare HMO | Attending: Internal Medicine | Admitting: Internal Medicine

## 2019-09-06 ENCOUNTER — Encounter: Payer: Self-pay | Admitting: Internal Medicine

## 2019-09-06 VITALS — BP 128/85 | HR 90 | Temp 98.7°F | Resp 18 | Ht 73.0 in | Wt 186.1 lb

## 2019-09-06 DIAGNOSIS — C342 Malignant neoplasm of middle lobe, bronchus or lung: Secondary | ICD-10-CM | POA: Insufficient documentation

## 2019-09-06 DIAGNOSIS — E039 Hypothyroidism, unspecified: Secondary | ICD-10-CM | POA: Diagnosis not present

## 2019-09-06 DIAGNOSIS — Z7901 Long term (current) use of anticoagulants: Secondary | ICD-10-CM | POA: Diagnosis not present

## 2019-09-06 DIAGNOSIS — Z9221 Personal history of antineoplastic chemotherapy: Secondary | ICD-10-CM | POA: Insufficient documentation

## 2019-09-06 DIAGNOSIS — Z923 Personal history of irradiation: Secondary | ICD-10-CM | POA: Diagnosis not present

## 2019-09-06 DIAGNOSIS — C3491 Malignant neoplasm of unspecified part of right bronchus or lung: Secondary | ICD-10-CM

## 2019-09-06 DIAGNOSIS — C3431 Malignant neoplasm of lower lobe, right bronchus or lung: Secondary | ICD-10-CM

## 2019-09-06 DIAGNOSIS — I4891 Unspecified atrial fibrillation: Secondary | ICD-10-CM | POA: Insufficient documentation

## 2019-09-06 DIAGNOSIS — Z79899 Other long term (current) drug therapy: Secondary | ICD-10-CM | POA: Diagnosis not present

## 2019-09-06 DIAGNOSIS — Z5112 Encounter for antineoplastic immunotherapy: Secondary | ICD-10-CM | POA: Diagnosis not present

## 2019-09-06 DIAGNOSIS — I10 Essential (primary) hypertension: Secondary | ICD-10-CM

## 2019-09-06 LAB — CMP (CANCER CENTER ONLY)
ALT: 21 U/L (ref 0–44)
AST: 28 U/L (ref 15–41)
Albumin: 3.9 g/dL (ref 3.5–5.0)
Alkaline Phosphatase: 90 U/L (ref 38–126)
Anion gap: 12 (ref 5–15)
BUN: 10 mg/dL (ref 8–23)
CO2: 22 mmol/L (ref 22–32)
Calcium: 9.2 mg/dL (ref 8.9–10.3)
Chloride: 101 mmol/L (ref 98–111)
Creatinine: 0.82 mg/dL (ref 0.61–1.24)
GFR, Est AFR Am: >60 mL/min (ref >60)
GFR, Estimated: >60 mL/min (ref >60)
Glucose, Bld: 66 mg/dL — ABNORMAL LOW (ref 70–99)
Potassium: 5 mmol/L (ref 3.5–5.1)
Sodium: 135 mmol/L (ref 135–145)
Total Bilirubin: 0.9 mg/dL (ref 0.3–1.2)
Total Protein: 7.4 g/dL (ref 6.5–8.1)

## 2019-09-06 LAB — CBC WITH DIFFERENTIAL (CANCER CENTER ONLY)
Abs Immature Granulocytes: 0.02 10*3/uL (ref 0.00–0.07)
Basophils Absolute: 0.1 10*3/uL (ref 0.0–0.1)
Basophils Relative: 1 %
Eosinophils Absolute: 0.1 10*3/uL (ref 0.0–0.5)
Eosinophils Relative: 2 %
HCT: 42.3 % (ref 39.0–52.0)
Hemoglobin: 15 g/dL (ref 13.0–17.0)
Immature Granulocytes: 0 %
Lymphocytes Relative: 27 %
Lymphs Abs: 1.4 10*3/uL (ref 0.7–4.0)
MCH: 39 pg — ABNORMAL HIGH (ref 26.0–34.0)
MCHC: 35.5 g/dL (ref 30.0–36.0)
MCV: 109.9 fL — ABNORMAL HIGH (ref 80.0–100.0)
Monocytes Absolute: 0.5 10*3/uL (ref 0.1–1.0)
Monocytes Relative: 9 %
Neutro Abs: 3.2 10*3/uL (ref 1.7–7.7)
Neutrophils Relative %: 61 %
Platelet Count: 186 10*3/uL (ref 150–400)
RBC: 3.85 MIL/uL — ABNORMAL LOW (ref 4.22–5.81)
RDW: 15.7 % — ABNORMAL HIGH (ref 11.5–15.5)
WBC Count: 5.3 10*3/uL (ref 4.0–10.5)
nRBC: 0 % (ref 0.0–0.2)

## 2019-09-06 MED ORDER — SODIUM CHLORIDE 0.9 % IV SOLN
Freq: Once | INTRAVENOUS | Status: AC
  Filled 2019-09-06: qty 250

## 2019-09-06 MED ORDER — SODIUM CHLORIDE 0.9 % IV SOLN
10.0000 mg/kg | Freq: Once | INTRAVENOUS | Status: AC
  Administered 2019-09-06: 860 mg via INTRAVENOUS
  Filled 2019-09-06: qty 10

## 2019-09-06 NOTE — Patient Instructions (Signed)
[facility] Discharge Instructions for Patients Receiving Chemotherapy  Today you received the following chemotherapy agents: durvalumab.  To help prevent nausea and vomiting after your treatment, we encourage you to take your nausea medication as directed.   If you develop nausea and vomiting that is not controlled by your nausea medication, call the clinic.   BELOW ARE SYMPTOMS THAT SHOULD BE REPORTED IMMEDIATELY:  *FEVER GREATER THAN 100.5 F  *CHILLS WITH OR WITHOUT FEVER  NAUSEA AND VOMITING THAT IS NOT CONTROLLED WITH YOUR NAUSEA MEDICATION  *UNUSUAL SHORTNESS OF BREATH  *UNUSUAL BRUISING OR BLEEDING  TENDERNESS IN MOUTH AND THROAT WITH OR WITHOUT PRESENCE OF ULCERS  *URINARY PROBLEMS  *BOWEL PROBLEMS  UNUSUAL RASH Items with * indicate a potential emergency and should be followed up as soon as possible.  Feel free to call the clinic should you have any questions or concerns. The clinic phone number is [phone].  Please show the CHEMO ALERT CARD at check-in to the Emergency Department and triage nurse.   

## 2019-09-06 NOTE — Progress Notes (Signed)
[place] [phone]   [phone]  OFFICE PROGRESS NOTE  [name], MD [place] [address]  DIAGNOSIS: Stage IIIA (T3, N1, M0) non-small cell lung cancer, squamous cell carcinoma diagnosed in July 2020 and presented with right middle lobe mass and right hilar adenopathy with postobstructive pneumonia and suspicious right pleural effusion.  PRIOR THERAPY:  Weekly concurrent chemoradiation with Carboplatin for an AUC of 2 and paclitaxel 45 mg/m2. First dose 11/23/2018. Status post 7 cycles.   CURRENT THERAPY: Consolidation immunotherapy with Imfinzi 10 mg/KG every 2 weeks.  First dose February 08, 2019.  Status post 15 cycles.  INTERVAL HISTORY: Joe Cole 69 y.o. male returns to the clinic today for follow-up visit.  The patient is feeling fine today with no concerning complaints except for shortness of breath with exertion.  He denied having any current chest pain, cough or hemoptysis.  He denied having any recent weight loss or night sweats.  He has no nausea, vomiting, diarrhea or constipation.  He is here today for evaluation before starting cycle #16 of his treatment.   MEDICAL HISTORY: Past Medical History:  Diagnosis Date  . Arthritis   . Atrial fibrillation ([place]) 10/2018  . COPD with emphysema ([place])   . Dyspnea   . Hypertension   . lung ca dx'd 10/2018    ALLERGIES:  has No Known Allergies.  MEDICATIONS:  Current Outpatient Medications  Medication Sig Dispense Refill  . acetaminophen (TYLENOL) 500 MG tablet Take 1,000 mg by mouth every 6 (six) hours as needed for moderate pain or headache.    . albuterol (PROAIR HFA) 108 (90 Base) MCG/ACT inhaler Inhale 2 puffs into the lungs every 6 (six) hours as needed for wheezing or shortness of breath. 1 Inhaler 5  . apixaban (ELIQUIS) 5 MG TABS tablet Take 1 tablet (5 mg total) by mouth 2 (two) times daily. 60 tablet 9  . atorvastatin (LIPITOR) 20  MG tablet Take 20 mg by mouth daily.    Marland Kitchen diltiazem (CARDIZEM CD) 360 MG 24 hr capsule Take 1 capsule (360 mg total) by mouth daily. 90 capsule 2  . levothyroxine (SYNTHROID) 50 MCG tablet Take 1 tablet (50 mcg total) by mouth daily before breakfast. 30 tablet 2  . metoprolol succinate (TOPROL XL) 25 MG 24 hr tablet Take 1 tablet (25 mg total) by mouth at bedtime. 30 tablet 11  . metoprolol succinate (TOPROL XL) 50 MG 24 hr tablet Take 1 tablet (50 mg total) by mouth at bedtime. Take with or immediately following a meal. 30 tablet 11  . Probiotic Product (ALIGN) 4 MG CAPS Take 4 mg by mouth daily.    . prochlorperazine (COMPAZINE) 10 MG tablet Take 1 tablet (10 mg total) by mouth every 6 (six) hours as needed for nausea or vomiting. 30 tablet 0  . traZODone (DESYREL) 50 MG tablet TAKE 1 TABLET BY MOUTH EVERYDAY AT BEDTIME 90 tablet 1  . umeclidinium-vilanterol (ANORO ELLIPTA) 62.5-25 MCG/INH AEPB Inhale 1 puff into the lungs daily. 1 each 6   No current facility-administered medications for this visit.    SURGICAL HISTORY:  Past Surgical History:  Procedure Laterality Date  . CARDIOVERSION N/A 01/11/2019   Procedure: CARDIOVERSION;  Surgeon: Josue Hector, MD;  Location: [facility] ENDOSCOPY;  Service: Cardiovascular;  Laterality: N/A;  . KNEE ARTHROSCOPY     BIL   . TOE SURGERY     PINNED LEFT BIG TOE  .  TONSILLECTOMY     T+A  AS CHILD  . TOTAL KNEE ARTHROPLASTY Left 08/19/2017   Procedure: TOTAL KNEE ARTHROPLASTY;  Surgeon: Renette Butters, MD;  Location: [place];  Service: Orthopedics;  Laterality: Left;  Marland Kitchen VIDEO BRONCHOSCOPY Bilateral 11/05/2018   Procedure: VIDEO BRONCHOSCOPY WITH FLUORO;  Surgeon: [name], MD;  Location: [facility] ENDOSCOPY;  Service: Cardiopulmonary;  Laterality: Bilateral;  . VIDEO BRONCHOSCOPY Bilateral 11/09/2018   Procedure: VIDEO BRONCHOSCOPY WITH FLUORO;  Surgeon: Rigoberto Noel, MD;  Location: [place];  Service: Cardiopulmonary;  Laterality: Bilateral;    REVIEW  OF SYSTEMS:  A comprehensive review of systems was negative except for: Respiratory: positive for dyspnea on exertion   PHYSICAL EXAMINATION: General appearance: alert, cooperative and no distress Head: Normocephalic, without obvious abnormality, atraumatic Neck: no adenopathy, no JVD, supple, symmetrical, trachea midline and thyroid not enlarged, symmetric, no tenderness/mass/nodules Lymph nodes: Cervical, supraclavicular, and axillary nodes normal. Resp: clear to auscultation bilaterally Back: symmetric, no curvature. ROM normal. No CVA tenderness. Cardio: regular rate and rhythm, S1, S2 normal, no murmur, click, rub or gallop GI: soft, non-tender; bowel sounds normal; no masses,  no organomegaly Extremities: extremities normal, atraumatic, no cyanosis or edema  ECOG PERFORMANCE STATUS: 1 - Symptomatic but completely ambulatory  Blood pressure 128/85, pulse 90, temperature 98.7 F (37.1 C), temperature source Temporal, resp. rate 18, height 6\' 1"  (1.854 m), weight 186 lb 1.6 oz (84.4 kg), SpO2 99 %.  LABORATORY DATA: Lab Results  Component Value Date   WBC 5.3 09/06/2019   HGB 15.0 09/06/2019   HCT 42.3 09/06/2019   MCV 109.9 (H) 09/06/2019   PLT 186 09/06/2019      Chemistry      Component Value Date/Time   NA 135 09/06/2019 1123   K 5.0 09/06/2019 1123   CL 101 09/06/2019 1123   CO2 22 09/06/2019 1123   BUN 10 09/06/2019 1123   CREATININE 0.82 09/06/2019 1123      Component Value Date/Time   CALCIUM 9.2 09/06/2019 1123   ALKPHOS 90 09/06/2019 1123   AST 28 09/06/2019 1123   ALT 21 09/06/2019 1123   BILITOT 0.9 09/06/2019 1123       RADIOGRAPHIC STUDIES: No results found.  ASSESSMENT AND PLAN: This is a very pleasant 69 years old white male with stage IIIA non-small cell lung cancer, squamous cell carcinoma.  The patient underwent a course of concurrent chemoradiation with weekly carboplatin and paclitaxel status post 7 cycles.  The patient tolerated his treatment  well except for mild fatigue and odynophagia. He is currently undergoing consolidation treatment with immunotherapy with Imfinzi status post 15 cycles.   He continues to tolerate his treatment well with no concerning adverse effects. I recommended for him to proceed with cycle #16 today as planned. For the hypothyroidism, he is currently on levothyroxine 50 mcg p.o. daily.  We will continue to monitor his TSH closely. For the history of atrial fibrillation, he is currently on treatment with Eliquis. The patient will come back for follow-up visit in 2 weeks before the next cycle of his treatment. He was advised to call immediately if he has any concerning symptoms in the interval. The patient voices understanding of current disease status and treatment options and is in agreement with the current care plan. All questions were answered. The patient knows to call the clinic with any problems, questions or concerns. We can certainly see the patient much sooner if necessary.  Disclaimer: This note was dictated with voice  recognition software. Similar sounding words can inadvertently be transcribed and may not be corrected upon review.

## 2019-09-13 ENCOUNTER — Ambulatory Visit: Payer: Medicare HMO | Admitting: Pulmonary Disease

## 2019-09-14 NOTE — Progress Notes (Signed)
Pharmacist Chemotherapy Monitoring - Follow Up Assessment    I verify that I have reviewed each item in the below checklist:  . Regimen for the patient is scheduled for the appropriate day and plan matches scheduled date. Marland Kitchen Appropriate non-routine labs are ordered dependent on drug ordered. . If applicable, additional medications reviewed and ordered per protocol based on lifetime cumulative doses and/or treatment regimen.   Plan for follow-up and/or issues identified: No . I-vent associated with next due treatment: No . MD and/or nursing notified: No  James,Teldrin D 09/14/2019 9:21 AM

## 2019-09-16 ENCOUNTER — Other Ambulatory Visit: Payer: Self-pay | Admitting: Internal Medicine

## 2019-09-20 ENCOUNTER — Inpatient Hospital Stay: Payer: Medicare HMO

## 2019-09-20 ENCOUNTER — Inpatient Hospital Stay (HOSPITAL_BASED_OUTPATIENT_CLINIC_OR_DEPARTMENT_OTHER): Payer: Medicare HMO | Admitting: Internal Medicine

## 2019-09-20 ENCOUNTER — Encounter: Payer: Self-pay | Admitting: Internal Medicine

## 2019-09-20 ENCOUNTER — Telehealth: Payer: Self-pay | Admitting: Medical Oncology

## 2019-09-20 ENCOUNTER — Other Ambulatory Visit: Payer: Self-pay | Admitting: Internal Medicine

## 2019-09-20 ENCOUNTER — Other Ambulatory Visit: Payer: Self-pay

## 2019-09-20 VITALS — BP 129/69 | HR 75 | Temp 97.9°F | Resp 18 | Ht 73.0 in | Wt 188.9 lb

## 2019-09-20 DIAGNOSIS — C3491 Malignant neoplasm of unspecified part of right bronchus or lung: Secondary | ICD-10-CM

## 2019-09-20 DIAGNOSIS — Z5112 Encounter for antineoplastic immunotherapy: Secondary | ICD-10-CM | POA: Diagnosis not present

## 2019-09-20 DIAGNOSIS — C3431 Malignant neoplasm of lower lobe, right bronchus or lung: Secondary | ICD-10-CM

## 2019-09-20 LAB — CMP (CANCER CENTER ONLY)
ALT: 24 U/L (ref 0–44)
AST: 31 U/L (ref 15–41)
Albumin: 3.7 g/dL (ref 3.5–5.0)
Alkaline Phosphatase: 90 U/L (ref 38–126)
Anion gap: 10 (ref 5–15)
BUN: 10 mg/dL (ref 8–23)
CO2: 23 mmol/L (ref 22–32)
Calcium: 9.2 mg/dL (ref 8.9–10.3)
Chloride: 102 mmol/L (ref 98–111)
Creatinine: 0.85 mg/dL (ref 0.61–1.24)
GFR, Est AFR Am: >60 mL/min (ref >60)
GFR, Estimated: >60 mL/min (ref >60)
Glucose, Bld: 113 mg/dL — ABNORMAL HIGH (ref 70–99)
Potassium: 4.7 mmol/L (ref 3.5–5.1)
Sodium: 135 mmol/L (ref 135–145)
Total Bilirubin: 0.6 mg/dL (ref 0.3–1.2)
Total Protein: 7.3 g/dL (ref 6.5–8.1)

## 2019-09-20 LAB — CBC WITH DIFFERENTIAL (CANCER CENTER ONLY)
Abs Immature Granulocytes: 0.04 10*3/uL (ref 0.00–0.07)
Basophils Absolute: 0.1 10*3/uL (ref 0.0–0.1)
Basophils Relative: 1 %
Eosinophils Absolute: 0.1 10*3/uL (ref 0.0–0.5)
Eosinophils Relative: 2 %
HCT: 42.4 % (ref 39.0–52.0)
Hemoglobin: 14.7 g/dL (ref 13.0–17.0)
Immature Granulocytes: 1 %
Lymphocytes Relative: 23 %
Lymphs Abs: 1.1 10*3/uL (ref 0.7–4.0)
MCH: 39.2 pg — ABNORMAL HIGH (ref 26.0–34.0)
MCHC: 34.7 g/dL (ref 30.0–36.0)
MCV: 113.1 fL — ABNORMAL HIGH (ref 80.0–100.0)
Monocytes Absolute: 0.3 10*3/uL (ref 0.1–1.0)
Monocytes Relative: 7 %
Neutro Abs: 3.2 10*3/uL (ref 1.7–7.7)
Neutrophils Relative %: 66 %
Platelet Count: 187 10*3/uL (ref 150–400)
RBC: 3.75 MIL/uL — ABNORMAL LOW (ref 4.22–5.81)
RDW: 15.7 % — ABNORMAL HIGH (ref 11.5–15.5)
WBC Count: 4.8 10*3/uL (ref 4.0–10.5)
nRBC: 0 % (ref 0.0–0.2)

## 2019-09-20 LAB — TSH: TSH: 23.643 u[IU]/mL — ABNORMAL HIGH (ref 0.320–4.118)

## 2019-09-20 MED ORDER — SODIUM CHLORIDE 0.9% FLUSH
10.0000 mL | INTRAVENOUS | Status: DC | PRN
  Filled 2019-09-20: qty 10

## 2019-09-20 MED ORDER — LEVOTHYROXINE SODIUM 75 MCG PO TABS: 75.0000 ug | ORAL_TABLET | Freq: Every day | ORAL | 1 refills | Status: DC

## 2019-09-20 MED ORDER — SODIUM CHLORIDE 0.9 % IV SOLN
Freq: Once | INTRAVENOUS | Status: AC
  Filled 2019-09-20: qty 250

## 2019-09-20 MED ORDER — SODIUM CHLORIDE 0.9 % IV SOLN
10.0000 mg/kg | Freq: Once | INTRAVENOUS | Status: AC
  Administered 2019-09-20: 860 mg via INTRAVENOUS
  Filled 2019-09-20: qty 7.2

## 2019-09-20 NOTE — Telephone Encounter (Signed)
Wife notified of increase in thyroid medication.

## 2019-09-20 NOTE — Progress Notes (Signed)
[place] [phone]   [phone]  OFFICE PROGRESS NOTE  [name], MD [place] [address]  DIAGNOSIS: Stage IIIA (T3, N1, M0) non-small cell lung cancer, squamous cell carcinoma diagnosed in July 2020 and presented with right middle lobe mass and right hilar adenopathy with postobstructive pneumonia and suspicious right pleural effusion.  PRIOR THERAPY:  Weekly concurrent chemoradiation with Carboplatin for an AUC of 2 and paclitaxel 45 mg/m2. First dose 11/23/2018. Status post 7 cycles.   CURRENT THERAPY: Consolidation immunotherapy with Imfinzi 10 mg/KG every 2 weeks.  First dose February 08, 2019.  Status post 16 cycles.  INTERVAL HISTORY: Joe Cole 69 y.o. male returns to the clinic today for follow-up visit.  The patient is feeling fine today with no concerning complaints.  He denied having any current chest pain, shortness of breath, cough or hemoptysis.  He denied having any fever or chills.  He has no nausea, vomiting, diarrhea or constipation.  He has no headache or visual changes.  The patient is here today for evaluation before starting cycle #17.  MEDICAL HISTORY: Past Medical History:  Diagnosis Date  . Arthritis   . Atrial fibrillation ([place]) 10/2018  . COPD with emphysema ([place])   . Dyspnea   . Hypertension   . lung ca dx'd 10/2018    ALLERGIES:  has No Known Allergies.  MEDICATIONS:  Current Outpatient Medications  Medication Sig Dispense Refill  . acetaminophen (TYLENOL) 500 MG tablet Take 1,000 mg by mouth every 6 (six) hours as needed for moderate pain or headache.    . albuterol (PROAIR HFA) 108 (90 Base) MCG/ACT inhaler Inhale 2 puffs into the lungs every 6 (six) hours as needed for wheezing or shortness of breath. 1 Inhaler 5  . apixaban (ELIQUIS) 5 MG TABS tablet Take 1 tablet (5 mg total) by mouth 2 (two) times daily. 60 tablet 9  . atorvastatin (LIPITOR) 20 MG tablet Take  20 mg by mouth daily.    Marland Kitchen diltiazem (CARDIZEM CD) 360 MG 24 hr capsule Take 1 capsule (360 mg total) by mouth daily. 90 capsule 2  . levothyroxine (SYNTHROID) 50 MCG tablet TAKE 1 TABLET BY MOUTH EVERY DAY BEFORE BREAKFAST 30 tablet 1  . metoprolol succinate (TOPROL XL) 25 MG 24 hr tablet Take 1 tablet (25 mg total) by mouth at bedtime. 30 tablet 11  . metoprolol succinate (TOPROL XL) 50 MG 24 hr tablet Take 1 tablet (50 mg total) by mouth at bedtime. Take with or immediately following a meal. 30 tablet 11  . Probiotic Product (ALIGN) 4 MG CAPS Take 4 mg by mouth daily.    . prochlorperazine (COMPAZINE) 10 MG tablet Take 1 tablet (10 mg total) by mouth every 6 (six) hours as needed for nausea or vomiting. 30 tablet 0  . traZODone (DESYREL) 50 MG tablet TAKE 1 TABLET BY MOUTH EVERYDAY AT BEDTIME 90 tablet 1  . umeclidinium-vilanterol (ANORO ELLIPTA) 62.5-25 MCG/INH AEPB Inhale 1 puff into the lungs daily. 1 each 6   No current facility-administered medications for this visit.    SURGICAL HISTORY:  Past Surgical History:  Procedure Laterality Date  . CARDIOVERSION N/A 01/11/2019   Procedure: CARDIOVERSION;  Surgeon: Josue Hector, MD;  Location: [facility] ENDOSCOPY;  Service: Cardiovascular;  Laterality: N/A;  . KNEE ARTHROSCOPY     BIL   . TOE SURGERY     PINNED LEFT BIG TOE  . TONSILLECTOMY  T+A  AS CHILD  . TOTAL KNEE ARTHROPLASTY Left 08/19/2017   Procedure: TOTAL KNEE ARTHROPLASTY;  Surgeon: Renette Butters, MD;  Location: [place];  Service: Orthopedics;  Laterality: Left;  Marland Kitchen VIDEO BRONCHOSCOPY Bilateral 11/05/2018   Procedure: VIDEO BRONCHOSCOPY WITH FLUORO;  Surgeon: [name], MD;  Location: [facility] ENDOSCOPY;  Service: Cardiopulmonary;  Laterality: Bilateral;  . VIDEO BRONCHOSCOPY Bilateral 11/09/2018   Procedure: VIDEO BRONCHOSCOPY WITH FLUORO;  Surgeon: Rigoberto Noel, MD;  Location: [place];  Service: Cardiopulmonary;  Laterality: Bilateral;    REVIEW OF SYSTEMS:  A  comprehensive review of systems was negative.   PHYSICAL EXAMINATION: General appearance: alert, cooperative and no distress Head: Normocephalic, without obvious abnormality, atraumatic Neck: no adenopathy, no JVD, supple, symmetrical, trachea midline and thyroid not enlarged, symmetric, no tenderness/mass/nodules Lymph nodes: Cervical, supraclavicular, and axillary nodes normal. Resp: clear to auscultation bilaterally Back: symmetric, no curvature. ROM normal. No CVA tenderness. Cardio: regular rate and rhythm, S1, S2 normal, no murmur, click, rub or gallop GI: soft, non-tender; bowel sounds normal; no masses,  no organomegaly Extremities: extremities normal, atraumatic, no cyanosis or edema  ECOG PERFORMANCE STATUS: 1 - Symptomatic but completely ambulatory  Blood pressure 129/69, pulse 75, temperature 97.9 F (36.6 C), temperature source Temporal, resp. rate 18, height 6\' 1"  (1.854 m), weight 188 lb 14.4 oz (85.7 kg), SpO2 100 %.  LABORATORY DATA: Lab Results  Component Value Date   WBC 4.8 09/20/2019   HGB 14.7 09/20/2019   HCT 42.4 09/20/2019   MCV 113.1 (H) 09/20/2019   PLT 187 09/20/2019      Chemistry      Component Value Date/Time   NA 135 09/06/2019 1123   K 5.0 09/06/2019 1123   CL 101 09/06/2019 1123   CO2 22 09/06/2019 1123   BUN 10 09/06/2019 1123   CREATININE 0.82 09/06/2019 1123      Component Value Date/Time   CALCIUM 9.2 09/06/2019 1123   ALKPHOS 90 09/06/2019 1123   AST 28 09/06/2019 1123   ALT 21 09/06/2019 1123   BILITOT 0.9 09/06/2019 1123       RADIOGRAPHIC STUDIES: No results found.  ASSESSMENT AND PLAN: This is a very pleasant 69 years old white male with stage IIIA non-small cell lung cancer, squamous cell carcinoma.  The patient underwent a course of concurrent chemoradiation with weekly carboplatin and paclitaxel status post 7 cycles.  The patient tolerated his treatment well except for mild fatigue and odynophagia. He is currently  undergoing consolidation treatment with immunotherapy with Imfinzi status post 16 cycles.   The patient has no concerning complaints and he continues to tolerate his treatment well. I recommended for him to proceed with cycle #17 today as planned. He will come back for follow-up visit in 2 weeks for evaluation before the next cycle of his treatment. For the hypothyroidism, he is currently on levothyroxine 50 mcg p.o. daily.  We will continue to monitor his TSH closely. For the history of atrial fibrillation, he is currently on treatment with Eliquis. He was advised to call immediately if he has any concerning symptoms in the interval. The patient voices understanding of current disease status and treatment options and is in agreement with the current care plan. All questions were answered. The patient knows to call the clinic with any problems, questions or concerns. We can certainly see the patient much sooner if necessary.  Disclaimer: This note was dictated with voice recognition software. Similar sounding words can inadvertently be transcribed and  may not be corrected upon review.

## 2019-09-20 NOTE — Patient Instructions (Signed)
[facility] Discharge Instructions for Patients Receiving Chemotherapy  Today you received the following chemotherapy agents: Imfinzi.  To help prevent nausea and vomiting after your treatment, we encourage you to take your nausea medication as directed.   If you develop nausea and vomiting that is not controlled by your nausea medication, call the clinic.   BELOW ARE SYMPTOMS THAT SHOULD BE REPORTED IMMEDIATELY:  *FEVER GREATER THAN 100.5 F  *CHILLS WITH OR WITHOUT FEVER  NAUSEA AND VOMITING THAT IS NOT CONTROLLED WITH YOUR NAUSEA MEDICATION  *UNUSUAL SHORTNESS OF BREATH  *UNUSUAL BRUISING OR BLEEDING  TENDERNESS IN MOUTH AND THROAT WITH OR WITHOUT PRESENCE OF ULCERS  *URINARY PROBLEMS  *BOWEL PROBLEMS  UNUSUAL RASH Items with * indicate a potential emergency and should be followed up as soon as possible.  Feel free to call the clinic should you have any questions or concerns. The clinic phone number is [phone].  Please show the CHEMO ALERT CARD at check-in to the Emergency Department and triage nurse.   

## 2019-09-23 DIAGNOSIS — J449 Chronic obstructive pulmonary disease, unspecified: Secondary | ICD-10-CM | POA: Diagnosis not present

## 2019-09-23 DIAGNOSIS — M1990 Unspecified osteoarthritis, unspecified site: Secondary | ICD-10-CM | POA: Diagnosis not present

## 2019-09-23 DIAGNOSIS — E785 Hyperlipidemia, unspecified: Secondary | ICD-10-CM | POA: Diagnosis not present

## 2019-09-23 DIAGNOSIS — I10 Essential (primary) hypertension: Secondary | ICD-10-CM | POA: Diagnosis not present

## 2019-09-23 DIAGNOSIS — C3491 Malignant neoplasm of unspecified part of right bronchus or lung: Secondary | ICD-10-CM | POA: Diagnosis not present

## 2019-09-23 DIAGNOSIS — E7800 Pure hypercholesterolemia, unspecified: Secondary | ICD-10-CM | POA: Diagnosis not present

## 2019-10-04 ENCOUNTER — Inpatient Hospital Stay: Payer: Medicare HMO

## 2019-10-04 ENCOUNTER — Inpatient Hospital Stay: Payer: Medicare HMO | Attending: Internal Medicine | Admitting: Internal Medicine

## 2019-10-04 ENCOUNTER — Encounter: Payer: Self-pay | Admitting: Internal Medicine

## 2019-10-04 ENCOUNTER — Other Ambulatory Visit: Payer: Self-pay

## 2019-10-04 VITALS — BP 131/90 | HR 84 | Temp 97.9°F | Resp 18 | Ht 73.0 in | Wt 187.6 lb

## 2019-10-04 DIAGNOSIS — Z79899 Other long term (current) drug therapy: Secondary | ICD-10-CM | POA: Diagnosis not present

## 2019-10-04 DIAGNOSIS — C342 Malignant neoplasm of middle lobe, bronchus or lung: Secondary | ICD-10-CM | POA: Insufficient documentation

## 2019-10-04 DIAGNOSIS — C3431 Malignant neoplasm of lower lobe, right bronchus or lung: Secondary | ICD-10-CM

## 2019-10-04 DIAGNOSIS — Z5112 Encounter for antineoplastic immunotherapy: Secondary | ICD-10-CM

## 2019-10-04 DIAGNOSIS — C3491 Malignant neoplasm of unspecified part of right bronchus or lung: Secondary | ICD-10-CM

## 2019-10-04 DIAGNOSIS — I10 Essential (primary) hypertension: Secondary | ICD-10-CM | POA: Diagnosis not present

## 2019-10-04 DIAGNOSIS — C3490 Malignant neoplasm of unspecified part of unspecified bronchus or lung: Secondary | ICD-10-CM

## 2019-10-04 LAB — CMP (CANCER CENTER ONLY)
ALT: 22 U/L (ref 0–44)
AST: 26 U/L (ref 15–41)
Albumin: 3.6 g/dL (ref 3.5–5.0)
Alkaline Phosphatase: 89 U/L (ref 38–126)
Anion gap: 10 (ref 5–15)
BUN: 11 mg/dL (ref 8–23)
CO2: 21 mmol/L — ABNORMAL LOW (ref 22–32)
Calcium: 9.2 mg/dL (ref 8.9–10.3)
Chloride: 103 mmol/L (ref 98–111)
Creatinine: 0.87 mg/dL (ref 0.61–1.24)
GFR, Est AFR Am: >60 mL/min (ref >60)
GFR, Estimated: >60 mL/min (ref >60)
Glucose, Bld: 128 mg/dL — ABNORMAL HIGH (ref 70–99)
Potassium: 4.3 mmol/L (ref 3.5–5.1)
Sodium: 134 mmol/L — ABNORMAL LOW (ref 135–145)
Total Bilirubin: 0.9 mg/dL (ref 0.3–1.2)
Total Protein: 6.9 g/dL (ref 6.5–8.1)

## 2019-10-04 LAB — CBC WITH DIFFERENTIAL (CANCER CENTER ONLY)
Abs Immature Granulocytes: 0.03 10*3/uL (ref 0.00–0.07)
Basophils Absolute: 0.1 10*3/uL (ref 0.0–0.1)
Basophils Relative: 2 %
Eosinophils Absolute: 0.1 10*3/uL (ref 0.0–0.5)
Eosinophils Relative: 2 %
HCT: 40.8 % (ref 39.0–52.0)
Hemoglobin: 14.4 g/dL (ref 13.0–17.0)
Immature Granulocytes: 1 %
Lymphocytes Relative: 27 %
Lymphs Abs: 1.3 10*3/uL (ref 0.7–4.0)
MCH: 40.2 pg — ABNORMAL HIGH (ref 26.0–34.0)
MCHC: 35.3 g/dL (ref 30.0–36.0)
MCV: 114 fL — ABNORMAL HIGH (ref 80.0–100.0)
Monocytes Absolute: 0.5 10*3/uL (ref 0.1–1.0)
Monocytes Relative: 10 %
Neutro Abs: 2.8 10*3/uL (ref 1.7–7.7)
Neutrophils Relative %: 58 %
Platelet Count: 199 10*3/uL (ref 150–400)
RBC: 3.58 MIL/uL — ABNORMAL LOW (ref 4.22–5.81)
RDW: 15.5 % (ref 11.5–15.5)
WBC Count: 4.7 10*3/uL (ref 4.0–10.5)
nRBC: 0 % (ref 0.0–0.2)

## 2019-10-04 MED ORDER — SODIUM CHLORIDE 0.9 % IV SOLN
10.0000 mg/kg | Freq: Once | INTRAVENOUS | Status: AC
  Administered 2019-10-04: 860 mg via INTRAVENOUS
  Filled 2019-10-04: qty 10

## 2019-10-04 MED ORDER — SODIUM CHLORIDE 0.9 % IV SOLN
Freq: Once | INTRAVENOUS | Status: AC
  Filled 2019-10-04: qty 250

## 2019-10-04 NOTE — Patient Instructions (Signed)
[facility] Discharge Instructions for Patients Receiving Chemotherapy  Today you received the following chemotherapy agents: Imfinzi.  To help prevent nausea and vomiting after your treatment, we encourage you to take your nausea medication as directed.   If you develop nausea and vomiting that is not controlled by your nausea medication, call the clinic.   BELOW ARE SYMPTOMS THAT SHOULD BE REPORTED IMMEDIATELY:  *FEVER GREATER THAN 100.5 F  *CHILLS WITH OR WITHOUT FEVER  NAUSEA AND VOMITING THAT IS NOT CONTROLLED WITH YOUR NAUSEA MEDICATION  *UNUSUAL SHORTNESS OF BREATH  *UNUSUAL BRUISING OR BLEEDING  TENDERNESS IN MOUTH AND THROAT WITH OR WITHOUT PRESENCE OF ULCERS  *URINARY PROBLEMS  *BOWEL PROBLEMS  UNUSUAL RASH Items with * indicate a potential emergency and should be followed up as soon as possible.  Feel free to call the clinic should you have any questions or concerns. The clinic phone number is [phone].  Please show the CHEMO ALERT CARD at check-in to the Emergency Department and triage nurse.   

## 2019-10-04 NOTE — Progress Notes (Signed)
[place] Telephone:(336) [phone]   Fax:(336) [phone]  OFFICE PROGRESS NOTE  [name], MD [place] [address]  DIAGNOSIS: Stage IIIA (T3, N1, M0) non-small cell lung cancer, squamous cell carcinoma diagnosed in July 2020 and presented with right middle lobe mass and right hilar adenopathy with postobstructive pneumonia and suspicious right pleural effusion.  PRIOR THERAPY:  Weekly concurrent chemoradiation with Carboplatin for an AUC of 2 and paclitaxel 45 mg/m2. First dose 11/23/2018. Status post 7 cycles.   CURRENT THERAPY: Consolidation immunotherapy with Imfinzi 10 mg/KG every 2 weeks.  First dose February 08, 2019.  Status post 17 cycles.  INTERVAL HISTORY: Joe Cole 69 y.o. male returns to the clinic today for follow-up visit.  The patient is feeling fine today with no concerning complaints except for shortness of breath with exertion.  He denied having any chest pain, cough or hemoptysis.  He has no recent weight loss or night sweats.  He has no nausea, vomiting, diarrhea or constipation.  He denied having any headache or visual changes.  He continues to tolerate his treatment with Imfinzi fairly well.  The patient is here today for evaluation before starting cycle #18.  MEDICAL HISTORY: Past Medical History:  Diagnosis Date   Arthritis    Atrial fibrillation ([place]) 10/2018   COPD with emphysema ([place])    Dyspnea    Hypertension    lung ca dx'd 10/2018    ALLERGIES:  has No Known Allergies.  MEDICATIONS:  Current Outpatient Medications  Medication Sig Dispense Refill   acetaminophen (TYLENOL) 500 MG tablet Take 1,000 mg by mouth every 6 (six) hours as needed for moderate pain or headache.     albuterol (PROAIR HFA) 108 (90 Base) MCG/ACT inhaler Inhale 2 puffs into the lungs every 6 (six) hours as needed for wheezing or shortness of breath. 1 Inhaler 5   apixaban (ELIQUIS) 5 MG TABS tablet Take 1 tablet  (5 mg total) by mouth 2 (two) times daily. 60 tablet 9   atorvastatin (LIPITOR) 20 MG tablet Take 20 mg by mouth daily.     diltiazem (CARDIZEM CD) 360 MG 24 hr capsule Take 1 capsule (360 mg total) by mouth daily. 90 capsule 2   levothyroxine (SYNTHROID) 75 MCG tablet Take 1 tablet (75 mcg total) by mouth daily before breakfast. 30 tablet 1   metoprolol succinate (TOPROL XL) 25 MG 24 hr tablet Take 1 tablet (25 mg total) by mouth at bedtime. 30 tablet 11   metoprolol succinate (TOPROL XL) 50 MG 24 hr tablet Take 1 tablet (50 mg total) by mouth at bedtime. Take with or immediately following a meal. 30 tablet 11   Probiotic Product (ALIGN) 4 MG CAPS Take 4 mg by mouth daily.     prochlorperazine (COMPAZINE) 10 MG tablet Take 1 tablet (10 mg total) by mouth every 6 (six) hours as needed for nausea or vomiting. 30 tablet 0   traZODone (DESYREL) 50 MG tablet TAKE 1 TABLET BY MOUTH EVERYDAY AT BEDTIME 90 tablet 1   umeclidinium-vilanterol (ANORO ELLIPTA) 62.5-25 MCG/INH AEPB Inhale 1 puff into the lungs daily. 1 each 6   No current facility-administered medications for this visit.    SURGICAL HISTORY:  Past Surgical History:  Procedure Laterality Date   CARDIOVERSION N/A 01/11/2019   Procedure: CARDIOVERSION;  Surgeon: Josue Hector, MD;  Location: [facility] ENDOSCOPY;  Service: Cardiovascular;  Laterality: N/A;   KNEE ARTHROSCOPY  BIL    TOE SURGERY     PINNED LEFT BIG TOE   TONSILLECTOMY     T+A  AS CHILD   TOTAL KNEE ARTHROPLASTY Left 08/19/2017   Procedure: TOTAL KNEE ARTHROPLASTY;  Surgeon: Renette Butters, MD;  Location: [place];  Service: Orthopedics;  Laterality: Left;   VIDEO BRONCHOSCOPY Bilateral 11/05/2018   Procedure: VIDEO BRONCHOSCOPY WITH FLUORO;  Surgeon: [name], MD;  Location: [place];  Service: Cardiopulmonary;  Laterality: Bilateral;   VIDEO BRONCHOSCOPY Bilateral 11/09/2018   Procedure: VIDEO BRONCHOSCOPY WITH FLUORO;  Surgeon: Rigoberto Noel, MD;   Location: [place];  Service: Cardiopulmonary;  Laterality: Bilateral;    REVIEW OF SYSTEMS:  A comprehensive review of systems was negative except for: Respiratory: positive for dyspnea on exertion   PHYSICAL EXAMINATION: General appearance: alert, cooperative and no distress Head: Normocephalic, without obvious abnormality, atraumatic Neck: no adenopathy, no JVD, supple, symmetrical, trachea midline and thyroid not enlarged, symmetric, no tenderness/mass/nodules Lymph nodes: Cervical, supraclavicular, and axillary nodes normal. Resp: clear to auscultation bilaterally Back: symmetric, no curvature. ROM normal. No CVA tenderness. Cardio: regular rate and rhythm, S1, S2 normal, no murmur, click, rub or gallop GI: soft, non-tender; bowel sounds normal; no masses,  no organomegaly Extremities: extremities normal, atraumatic, no cyanosis or edema  ECOG PERFORMANCE STATUS: 1 - Symptomatic but completely ambulatory  Blood pressure 131/90, pulse 84, temperature 97.9 F (36.6 C), temperature source Temporal, resp. rate 18, height 6\' 1"  (1.854 m), weight 187 lb 9.6 oz (85.1 kg), SpO2 100 %.  LABORATORY DATA: Lab Results  Component Value Date   WBC 4.7 10/04/2019   HGB 14.4 10/04/2019   HCT 40.8 10/04/2019   MCV 114.0 (H) 10/04/2019   PLT 199 10/04/2019      Chemistry      Component Value Date/Time   NA 135 09/20/2019 1140   K 4.7 09/20/2019 1140   CL 102 09/20/2019 1140   CO2 23 09/20/2019 1140   BUN 10 09/20/2019 1140   CREATININE 0.85 09/20/2019 1140      Component Value Date/Time   CALCIUM 9.2 09/20/2019 1140   ALKPHOS 90 09/20/2019 1140   AST 31 09/20/2019 1140   ALT 24 09/20/2019 1140   BILITOT 0.6 09/20/2019 1140       RADIOGRAPHIC STUDIES: No results found.  ASSESSMENT AND PLAN: This is a very pleasant 69 years old white male with stage IIIA non-small cell lung cancer, squamous cell carcinoma.  The patient underwent a course of concurrent chemoradiation with  weekly carboplatin and paclitaxel status post 7 cycles.  The patient tolerated his treatment well except for mild fatigue and odynophagia. He is currently undergoing consolidation treatment with immunotherapy with Imfinzi status post 17 cycles.   The patient continues to tolerate this treatment well with no concerning adverse effects. I recommended for him to proceed with cycle #18 today as planned. I will see him back for follow-up visit in 2 weeks for evaluation with repeat CT scan of the chest before starting cycle #19. For the hypothyroidism, he is currently on levothyroxine 50 mcg p.o. daily.  We will continue to monitor his TSH closely. For the history of atrial fibrillation, he is currently on treatment with Eliquis. The patient was advised to call immediately if he has any concerning symptoms in the interval. The patient voices understanding of current disease status and treatment options and is in agreement with the current care plan. All questions were answered. The patient knows to call the clinic  with any problems, questions or concerns. We can certainly see the patient much sooner if necessary.  Disclaimer: This note was dictated with voice recognition software. Similar sounding words can inadvertently be transcribed and may not be corrected upon review.

## 2019-10-06 ENCOUNTER — Telehealth: Payer: Self-pay | Admitting: Internal Medicine

## 2019-10-06 NOTE — Telephone Encounter (Signed)
Scheduled appt per 6/7 los - pt to get an updated schedule next visit.

## 2019-10-13 ENCOUNTER — Ambulatory Visit (HOSPITAL_COMMUNITY)
Admission: RE | Admit: 2019-10-13 | Discharge: 2019-10-13 | Disposition: A | Discharge status: Home / Self Care | Payer: Medicare HMO | Source: Ambulatory Visit | Attending: Physician Assistant | Admitting: Physician Assistant

## 2019-10-13 ENCOUNTER — Other Ambulatory Visit: Payer: Self-pay

## 2019-10-13 ENCOUNTER — Encounter (HOSPITAL_COMMUNITY): Payer: Self-pay | Admitting: Physician Assistant

## 2019-10-13 VITALS — BP 130/80 | HR 75 | Ht 73.0 in | Wt 187.2 lb

## 2019-10-13 DIAGNOSIS — Z8249 Family history of ischemic heart disease and other diseases of the circulatory system: Secondary | ICD-10-CM | POA: Insufficient documentation

## 2019-10-13 DIAGNOSIS — I4819 Other persistent atrial fibrillation: Secondary | ICD-10-CM

## 2019-10-13 DIAGNOSIS — Z836 Family history of other diseases of the respiratory system: Secondary | ICD-10-CM | POA: Diagnosis not present

## 2019-10-13 DIAGNOSIS — D6869 Other thrombophilia: Secondary | ICD-10-CM | POA: Diagnosis not present

## 2019-10-13 DIAGNOSIS — I10 Essential (primary) hypertension: Secondary | ICD-10-CM | POA: Diagnosis not present

## 2019-10-13 DIAGNOSIS — Z79899 Other long term (current) drug therapy: Secondary | ICD-10-CM | POA: Diagnosis not present

## 2019-10-13 DIAGNOSIS — J439 Emphysema, unspecified: Secondary | ICD-10-CM | POA: Insufficient documentation

## 2019-10-13 DIAGNOSIS — Z7901 Long term (current) use of anticoagulants: Secondary | ICD-10-CM | POA: Diagnosis not present

## 2019-10-13 DIAGNOSIS — E785 Hyperlipidemia, unspecified: Secondary | ICD-10-CM | POA: Insufficient documentation

## 2019-10-13 DIAGNOSIS — Z87891 Personal history of nicotine dependence: Secondary | ICD-10-CM | POA: Insufficient documentation

## 2019-10-13 DIAGNOSIS — Z85118 Personal history of other malignant neoplasm of bronchus and lung: Secondary | ICD-10-CM | POA: Insufficient documentation

## 2019-10-13 NOTE — Progress Notes (Signed)
Primary Care Physician: [name], MD Primary Cardiologist: Dr Marlou Porch Primary Electrophysiologist: none Referring Physician: Cecilie Kicks NP   Joe Cole is a 69 y.o. male with a history of tobacco abuse, HTN, HLD, COPD w/ emphysema, persistent atrial fibrillation, and lung cancer who presents for follow up in the [facility].  The patient was initially diagnosed with atrial fibrillation on 11/05/18 after presenting to the hospital for bronchoscopy. His heart rate was 140 bpm but the patient was asymptomatic. He does admit to swelling in his legs which has worsened over the last couple months. He was recently started on Toprol for better rate control. He denies any missed doses of anticoagulation. He denies significant snoring. He does admit to alcohol use, 1-2 drinks daily. Patient is s/p successful DCCV on 01/12/19. Unfortunately, patient had ERAF 1-2 days later. He states that he felt only minimal improvement after the DCCV.  On follow up today, patient reports doing reasonably well. He remains unaware of his arrhythmia. He denies bleeding issues on anticoagulation. He is followed closely for his lung cancer by Dr Julien Nordmann.   Today, he denies symptoms of palpitations, chest pain, orthopnea, PND, dizziness, presyncope, syncope, snoring, daytime somnolence, bleeding, or neurologic sequela. The patient is tolerating medications without difficulties and is otherwise without complaint today.    Atrial Fibrillation Risk Factors:  he does not have symptoms or diagnosis of sleep apnea. he does not have a history of rheumatic fever. he does have a history of alcohol use. The patient does have a history of early familial atrial fibrillation or other arrhythmias. Mother had afib.  he has a BMI of Body mass index is 24.7 kg/m.Marland Kitchen Filed Weights   10/13/19 1032  Weight: 84.9 kg    Family History  Problem Relation Age of Onset   Atrial fibrillation Mother     Emphysema Father    Pneumonia Father      Atrial Fibrillation Management history:  Previous antiarrhythmic drugs: none Previous cardioversions: 01/12/19 Previous ablations: none CHADS2VASC score: 2 Anticoagulation history: Eliquis   Past Medical History:  Diagnosis Date   Arthritis    Atrial fibrillation ([place]) 10/2018   COPD with emphysema ([place])    Dyspnea    Hypertension    lung ca dx'd 10/2018   Past Surgical History:  Procedure Laterality Date   CARDIOVERSION N/A 01/11/2019   Procedure: CARDIOVERSION;  Surgeon: Josue Hector, MD;  Location: [facility];  Service: Cardiovascular;  Laterality: N/A;   KNEE ARTHROSCOPY     BIL    TOE SURGERY     PINNED LEFT BIG TOE   TONSILLECTOMY     T+A  AS CHILD   TOTAL KNEE ARTHROPLASTY Left 08/19/2017   Procedure: TOTAL KNEE ARTHROPLASTY;  Surgeon: Renette Butters, MD;  Location: [facility];  Service: Orthopedics;  Laterality: Left;   VIDEO BRONCHOSCOPY Bilateral 11/05/2018   Procedure: VIDEO BRONCHOSCOPY WITH FLUORO;  Surgeon: [name], MD;  Location: [facility];  Service: Cardiopulmonary;  Laterality: Bilateral;   VIDEO BRONCHOSCOPY Bilateral 11/09/2018   Procedure: VIDEO BRONCHOSCOPY WITH FLUORO;  Surgeon: Rigoberto Noel, MD;  Location: [facility];  Service: Cardiopulmonary;  Laterality: Bilateral;    Current Outpatient Medications  Medication Sig Dispense Refill   acetaminophen (TYLENOL) 500 MG tablet Take 1,000 mg by mouth every 6 (six) hours as needed for moderate pain or headache.     albuterol (PROAIR HFA) 108 (90 Base) MCG/ACT inhaler Inhale 2 puffs into the lungs every 6 (  six) hours as needed for wheezing or shortness of breath. 1 Inhaler 5   apixaban (ELIQUIS) 5 MG TABS tablet Take 1 tablet (5 mg total) by mouth 2 (two) times daily. 60 tablet 9   atorvastatin (LIPITOR) 20 MG tablet Take 20 mg by mouth daily.     diltiazem (CARDIZEM CD) 360 MG 24 hr capsule Take 1 capsule (360 mg total) by mouth daily.  90 capsule 2   levothyroxine (SYNTHROID) 75 MCG tablet Take 1 tablet (75 mcg total) by mouth daily before breakfast. 30 tablet 1   metoprolol succinate (TOPROL XL) 25 MG 24 hr tablet Take 1 tablet (25 mg total) by mouth at bedtime. 30 tablet 11   metoprolol succinate (TOPROL XL) 50 MG 24 hr tablet Take 1 tablet (50 mg total) by mouth at bedtime. Take with or immediately following a meal. 30 tablet 11   Omega-3 Fatty Acids (FISH OIL) 1200 MG CAPS Take by mouth.     prochlorperazine (COMPAZINE) 10 MG tablet Take 1 tablet (10 mg total) by mouth every 6 (six) hours as needed for nausea or vomiting. 30 tablet 0   traZODone (DESYREL) 50 MG tablet TAKE 1 TABLET BY MOUTH EVERYDAY AT BEDTIME 90 tablet 1   umeclidinium-vilanterol (ANORO ELLIPTA) 62.5-25 MCG/INH AEPB Inhale 1 puff into the lungs daily. 1 each 6   No current facility-administered medications for this encounter.    No Known Allergies  Social History   Socioeconomic History   Marital status: Married    Spouse name: Not on file   Number of children: Not on file   Years of education: Not on file   Highest education level: Not on file  Occupational History   Not on file  Tobacco Use   Smoking status: Former Smoker    Packs/day: 1.50    Years: 46.00    Pack years: 69.00    Types: Cigarettes    Quit date: 09/08/2018    Years since quitting: 1.0   Smokeless tobacco: Never Used  Vaping Use   Vaping Use: Never used  Substance and Sexual Activity   Alcohol use: Yes    Alcohol/week: 15.0 - 16.0 standard drinks    Types: 12 Shots of liquor, 2 Standard drinks or equivalent, 1 - 2 Cans of beer per week    Comment: FEW DRINKS AFTER WORK   Drug use: Never   Sexual activity: Not on file  Other Topics Concern   Not on file  Social History Narrative   Not on file   Social Determinants of Health   Financial Resource Strain:    Difficulty of Paying Living Expenses:   Food Insecurity:    Worried About Paediatric nurse in the Last Year:    Arboriculturist in the Last Year:   Transportation Needs:    Film/video editor (Medical):    Lack of Transportation (Non-Medical):   Physical Activity:    Days of Exercise per Week:    Minutes of Exercise per Session:   Stress:    Feeling of Stress :   Social Connections:    Frequency of Communication with Friends and Family:    Frequency of Social Gatherings with Friends and Family:    Attends Religious Services:    Active Member of Clubs or Organizations:    Attends Archivist Meetings:    Marital Status:   Intimate Partner Violence:    Fear of Current or Ex-Partner:    Emotionally Abused:  Physically Abused:    Sexually Abused:      ROS- All systems are reviewed and negative except as per the HPI above.  Physical Exam: Vitals:   10/13/19 1032  BP: 130/80  Pulse: 75  Weight: 84.9 kg  Height: 6\' 1"  (1.854 m)    GEN- The patient is well appearing, alert and oriented x 3 today.   HEENT-head normocephalic, atraumatic, sclera clear, conjunctiva pink, hearing intact, trachea midline. Lungs- Clear to ausculation bilaterally, diminished breath sounds, normal work of breathing Heart- irregular rate and rhythm, no murmurs, rubs or gallops  GI- soft, NT, ND, + BS Extremities- no clubbing, cyanosis, or edema MS- no significant deformity or atrophy Skin- no rash or lesion Psych- euthymic mood, full affect Neuro- strength and sensation are intact   Wt Readings from Last 3 Encounters:  10/13/19 84.9 kg  10/04/19 85.1 kg  09/20/19 85.7 kg    EKG today demonstrates afib HR 75, QRS 90, QTc 422  Echo 11/06/18 demonstrated  1. The left ventricle has low normal systolic function, with an ejection fraction of 50-55%. The cavity size was mildly dilated. Left ventricular diastolic Doppler parameters are indeterminate.  2. Extremely poor acoustic windows limit study.  3. The right ventricle was not well visualized.  The cavity was moderately enlarged. There is not assessed.  4. RV is not well seen RVEF is at least mildly depressed.  5. Left atrial size was severely dilated.  6. Right atrial size was severely dilated.  7. The mitral valve is abnormal. Mild thickening of the mitral valve leaflet. There is mild mitral annular calcification present.  8. The tricuspid valve is grossly normal.  9. The aortic valve is abnormal. Mild thickening of the aortic valve. Mild calcification of the aortic valve. Aortic valve regurgitation is trivial by color flow Doppler. 10. The inferior vena cava was dilated in size with <50% respiratory variability.  Epic records are reviewed at length today  Assessment and Plan:  1. Persistent atrial fibrillation Patient remains asymptomatic and rate controlled. Continue Eliquis 5 mg BID.  Continue diltiazem 360 mg daily Continue Toprol 75 mg daily   This patients CHA2DS2-VASc Score and unadjusted Ischemic Stroke Rate (% per year) is equal to 2.2 % stroke rate/year from a score of 2  Above score calculated as 1 point each if present [CHF, HTN, DM, Vascular=MI/PAD/Aortic Plaque, Age if 65-74, or Male] Above score calculated as 2 points each if present [Age > 75, or Stroke/TIA/TE]  2. HTN Stable, no changes today.  3. Lung Cancer Plans per Dr Julien Nordmann.     Follow up with Dr Marlou Porch per recall. AF clinic in one year.   [facility] [address] [place], [place] [address] [phone] 10/13/2019 11:50 AM

## 2019-10-15 ENCOUNTER — Ambulatory Visit (HOSPITAL_COMMUNITY)
Admission: RE | Admit: 2019-10-15 | Discharge: 2019-10-15 | Disposition: A | Discharge status: Home / Self Care | Payer: Medicare HMO | Source: Ambulatory Visit | Attending: Internal Medicine | Admitting: Internal Medicine

## 2019-10-15 ENCOUNTER — Other Ambulatory Visit: Payer: Self-pay

## 2019-10-15 DIAGNOSIS — J90 Pleural effusion, not elsewhere classified: Secondary | ICD-10-CM | POA: Diagnosis not present

## 2019-10-15 DIAGNOSIS — C3490 Malignant neoplasm of unspecified part of unspecified bronchus or lung: Secondary | ICD-10-CM | POA: Insufficient documentation

## 2019-10-15 MED ORDER — SODIUM CHLORIDE (PF) 0.9 % IJ SOLN
INTRAMUSCULAR | Status: AC
  Filled 2019-10-15: qty 50

## 2019-10-15 MED ORDER — IOHEXOL 300 MG/ML  SOLN
75.0000 mL | Freq: Once | INTRAMUSCULAR | Status: AC
  Administered 2019-10-15: 75 mL via INTRAVENOUS

## 2019-10-16 NOTE — Progress Notes (Signed)
[facility] OFFICE PROGRESS NOTE  [name], MD [place] [address]  DIAGNOSIS: Stage IIIA (T3, N1, M0) non-small cell lung cancer, squamous cell carcinoma diagnosed in July 2020 and presented with right middle lobe mass and right hilar adenopathy with postobstructive pneumonia and suspicious right pleural effusion.  PRIOR THERAPY: Weekly concurrent chemoradiation with Carboplatin for an AUC of 2 and paclitaxel 45 mg/m2. First dose 11/23/2018. Status post 7 cycles.  CURRENT THERAPY: Consolidation immunotherapy with Imfinzi 10 mg/KG every 2 weeks. First dose February 08, 2019. Status post18cycles  INTERVAL HISTORY: Joe Cole 69 y.o. male returns Returns to the clinic for a follow up visit. The patient is feeling well today without any concerning complaints. The patient continues to tolerate treatment with immunotherapy with Imfinzi well without any adverse effects. Denies any fever, chills, night sweats, or weight loss. Denies any chest pain or hemoptysis. He reports his baseline dyspnea on exertion and mild cough in the morning secondary to post nasal drainage. Denies any nausea, vomiting, diarrhea, or constipation. Denies any headache or visual changes. Denies any rashes or skin changes. The patient recently had a restaging CT scan performed. The patient is here today for evaluation prior to starting cycle # 19   MEDICAL HISTORY: Past Medical History:  Diagnosis Date  . Arthritis   . Atrial fibrillation ([place]) 10/2018  . COPD with emphysema ([place])   . Dyspnea   . Hypertension   . lung ca dx'd 10/2018    ALLERGIES:  has No Known Allergies.  MEDICATIONS:  Current Outpatient Medications  Medication Sig Dispense Refill  . acetaminophen (TYLENOL) 500 MG tablet Take 1,000 mg by mouth every 6 (six) hours as needed for moderate pain or headache.    . albuterol (PROAIR HFA) 108 (90 Base) MCG/ACT inhaler Inhale 2 puffs into the lungs  every 6 (six) hours as needed for wheezing or shortness of breath. 1 Inhaler 5  . apixaban (ELIQUIS) 5 MG TABS tablet Take 1 tablet (5 mg total) by mouth 2 (two) times daily. 60 tablet 9  . atorvastatin (LIPITOR) 20 MG tablet Take 20 mg by mouth daily.    Marland Kitchen diltiazem (CARDIZEM CD) 360 MG 24 hr capsule Take 1 capsule (360 mg total) by mouth daily. 90 capsule 2  . levothyroxine (SYNTHROID) 75 MCG tablet Take 1 tablet (75 mcg total) by mouth daily before breakfast. 30 tablet 1  . metoprolol succinate (TOPROL XL) 25 MG 24 hr tablet Take 1 tablet (25 mg total) by mouth at bedtime. 30 tablet 11  . metoprolol succinate (TOPROL XL) 50 MG 24 hr tablet Take 1 tablet (50 mg total) by mouth at bedtime. Take with or immediately following a meal. 30 tablet 11  . Omega-3 Fatty Acids (FISH OIL) 1200 MG CAPS Take by mouth.    . prochlorperazine (COMPAZINE) 10 MG tablet Take 1 tablet (10 mg total) by mouth every 6 (six) hours as needed for nausea or vomiting. 30 tablet 0  . traZODone (DESYREL) 50 MG tablet TAKE 1 TABLET BY MOUTH EVERYDAY AT BEDTIME 90 tablet 1  . umeclidinium-vilanterol (ANORO ELLIPTA) 62.5-25 MCG/INH AEPB Inhale 1 puff into the lungs daily. 1 each 6   No current facility-administered medications for this visit.    SURGICAL HISTORY:  Past Surgical History:  Procedure Laterality Date  . CARDIOVERSION N/A 01/11/2019   Procedure: CARDIOVERSION;  Surgeon: Josue Hector, MD;  Location: [facility]OSCOPY;  Service: Cardiovascular;  Laterality: N/A;  .  KNEE ARTHROSCOPY     BIL   . TOE SURGERY     PINNED LEFT BIG TOE  . TONSILLECTOMY     T+A  AS CHILD  . TOTAL KNEE ARTHROPLASTY Left 08/19/2017   Procedure: TOTAL KNEE ARTHROPLASTY;  Surgeon: Renette Butters, MD;  Location: [facility];  Service: Orthopedics;  Laterality: Left;  Marland Kitchen VIDEO BRONCHOSCOPY Bilateral 11/05/2018   Procedure: VIDEO BRONCHOSCOPY WITH FLUORO;  Surgeon: [name], MD;  Location: [facility]COPY;  Service: Cardiopulmonary;  Laterality:  Bilateral;  . VIDEO BRONCHOSCOPY Bilateral 11/09/2018   Procedure: VIDEO BRONCHOSCOPY WITH FLUORO;  Surgeon: Rigoberto Noel, MD;  Location: [facility];  Service: Cardiopulmonary;  Laterality: Bilateral;    REVIEW OF SYSTEMS:   Review of Systems  Constitutional: Negative for appetite change, chills, fatigue, fever and unexpected weight change.  HENT: Negative for mouth sores, nosebleeds, sore throat and trouble swallowing.   Eyes: Negative for eye problems and icterus.  Respiratory: Positive for baseline dyspnea on exertion and mild cough. Negative for  hemoptysis and wheezing.   Cardiovascular: Negative for chest pain and leg swelling.  Gastrointestinal: Negative for abdominal pain, constipation, diarrhea, nausea and vomiting.  Genitourinary: Negative for bladder incontinence, difficulty urinating, dysuria, frequency and hematuria.   Musculoskeletal: Negative for back pain, gait problem, neck pain and neck stiffness.  Skin: Negative for itching and rash.  Neurological: Negative for dizziness, extremity weakness, gait problem, headaches, light-headedness and seizures.  Hematological: Negative for adenopathy. Does not bruise/bleed easily.  Psychiatric/Behavioral: Negative for confusion, depression and sleep disturbance. The patient is not nervous/anxious.     PHYSICAL EXAMINATION:  Blood pressure (!) 135/96, pulse 68, temperature 98.2 F (36.8 C), temperature source Temporal, resp. rate 20, height 6\' 1"  (1.854 m), weight 186 lb 14.4 oz (84.8 kg), SpO2 99 %.  ECOG PERFORMANCE STATUS: 1 - Symptomatic but completely ambulatory  Physical Exam  Constitutional: Oriented to person, place, and time and well-developed, well-nourished, and in no distress.  HENT:  Head: Normocephalic and atraumatic.  Mouth/Throat: Oropharynx is clear and moist. No oropharyngeal exudate.  Eyes: Conjunctivae are normal. Right eye exhibits no discharge. Left eye exhibits no discharge. No scleral icterus.  Neck:  Normal range of motion. Neck supple.  Cardiovascular: Normal rate, irregular rhythm, normal heart sounds and intact distal pulses.  Pulmonary/Chest: Effort normal and breath sounds normal. No respiratory distress. No wheezes. No rales.  Abdominal: Soft. Bowel sounds are normal. Exhibits no distension and no mass. There is no tenderness.  Musculoskeletal: Normal range of motion. Exhibits no edema.  Lymphadenopathy:    No cervical adenopathy.  Neurological: Alert and oriented to person, place, and time. Exhibits normal muscle tone. Gait normal. Coordination normal.  Skin: Skin is warm and dry. No rash noted. Not diaphoretic. No erythema. No pallor.  Psychiatric: Mood, memory and judgment normal.  Vitals reviewed.  LABORATORY DATA: Lab Results  Component Value Date   WBC 4.6 10/18/2019   HGB 14.6 10/18/2019   HCT 41.5 10/18/2019   MCV 113.1 (H) 10/18/2019   PLT 164 10/18/2019      Chemistry      Component Value Date/Time   NA 134 (L) 10/04/2019 1140   K 4.3 10/04/2019 1140   CL 103 10/04/2019 1140   CO2 21 (L) 10/04/2019 1140   BUN 11 10/04/2019 1140   CREATININE 0.87 10/04/2019 1140      Component Value Date/Time   CALCIUM 9.2 10/04/2019 1140   ALKPHOS 89 10/04/2019 1140   AST 26 10/04/2019 1140  ALT 22 10/04/2019 1140   BILITOT 0.9 10/04/2019 1140       RADIOGRAPHIC STUDIES:  CT Chest W Contrast  Result Date: 10/15/2019 CLINICAL DATA:  Primary Cancer Type: Lung Imaging Indication: Routine surveillance Interval therapy since last imaging? Yes Initial Cancer Diagnosis Date: 11/09/2018; Established by: Biopsy-proven Detailed Pathology: Stage IIIA non-small cell lung cancer, squamous cell carcinoma Primary Tumor location: Right middle lobe Chemotherapy: Yes; Ongoing?  No; Most recent administration: 12/2018 Immunotherapy?  Yes; Type: Imfinzi; Ongoing? Yes Radiation therapy? Yes; Date Range: 11/19/2018-01/05/2019; Target: Right perihilar EXAM: CT CHEST WITH CONTRAST  TECHNIQUE: Multidetector CT imaging of the chest was performed during intravenous contrast administration. CONTRAST:  53mL OMNIPAQUE IOHEXOL 300 MG/ML  SOLN COMPARISON:  Most recent CT chest 07/28/2019.  10/29/2018 PET-CT. FINDINGS: Cardiovascular: Aortic and branch vessel atherosclerosis. Normal heart size with minimal anterior pericardial fluid, similar. Multivessel coronary artery atherosclerosis. No central pulmonary embolism, on this non-dedicated study. Mediastinum/Nodes: Small middle mediastinal nodes are unchanged. No hilar adenopathy. Lungs/Pleura: Similar small right pleural effusion. Centrilobular and paraseptal emphysema, moderate. Minimal exclusion of the left lung base laterally. No pulmonary nodule or mass. Right perihilar radiation induced consolidation is minimally decreased. No evidence of locally recurrent disease. Upper Abdomen: Normal imaged portions of the liver, spleen, stomach, pancreas, kidneys. Right greater than left adrenal thickening is unchanged. Musculoskeletal: Probable sebaceous cyst about the right posterior chest wall, similar. Mild osteopenia. Degenerative or remote posttraumatic changes about the anterior left glenoid. Lower thoracic and lower cervical spondylosis. IMPRESSION: 1. Slight decrease in right perihilar radiation induced consolidation. No evidence of locally recurrent or metastatic disease. 2. Similar small right pleural effusion. 3. Aortic Atherosclerosis (ICD10-I70.0) and Emphysema (ICD10-J43.9). Coronary artery atherosclerosis. Electronically Signed   By: Abigail Miyamoto M.D.   On: 10/15/2019 11:52     ASSESSMENT/PLAN:  This is a very pleasant 69 year old Caucasian male diagnosed with stage IIIa (T3,N1,M0) non-small cell lung cancer, squamous cell carcinoma.He presented with a right middle lobe lung mass and right hilar adenopathy with postobstructive pneumonia and a suspicious right pleural effusion. He was diagnosed in July 2020.  The patient completed 5  cycles of weekly concurrent chemoradiation with carboplatin for an AUC of 2 and paclitaxel 45 mgm/2.   He is currently undergoing consolidation immunotherapy with Imfinzi 10 mg/kg IV every 2 weeks. He is status post18 cycles andhe istoleratingit well without any adverse side effects.   The patient recently had a restaging CT scan performed. Dr. Julien Nordmann personally and independently reviewed the scan and discussed the results with the patient. The scan did not show any evidence of disease progression. Dr. Julien Nordmann recommends that the patient continue on the same treatment at the same dose. He will receive cycle #19 today as scheduled.    We will see him back for a follow up visit in 2 weeks for evaluation before starting cycle #20  The patient was advised to call immediately if he has any concerning symptoms in the interval. The patient voices understanding of current disease status and treatment options and is in agreement with the current care plan. All questions were answered. The patient knows to call the clinic with any problems, questions or concerns. We can certainly see the patient much sooner if necessary  No orders of the defined types were placed in this encounter.    Anum Palecek L Taquanna Borras, PA-C 10/18/19  ADDENDUM: Hematology/Oncology Attending: I had a face-to-face encounter with the patient today.  I recommended his care plan.  This is a very pleasant 69  years old white male with stage IIIa non-small cell lung cancer, squamous cell carcinoma diagnosed in July 2020 status post concurrent chemoradiation with partial response.  The patient is currently undergoing consolidation treatment with immunotherapy with Imfinzi every 2 weeks is status post 18 cycles.  The patient has been doing well with no concerning adverse effects. He had repeat CT scan of the chest performed recently.  I personally and independently reviewed the scan and discussed the results with the patient  today. His scan showed no concerning findings for disease progression. I recommended for the patient to continue his current treatment with consolidation Imfinzi and he will proceed with cycle #19 today. I will see him back for follow-up visit in 2 weeks for evaluation before the next cycle of his treatment. The patient was advised to call immediately if he has any concerning symptoms in the interval.  Disclaimer: This note was dictated with voice recognition software. Similar sounding words can inadvertently be transcribed and may be missed upon review. Eilleen Kempf, MD 10/18/19

## 2019-10-18 ENCOUNTER — Other Ambulatory Visit: Payer: Self-pay | Admitting: Lab

## 2019-10-18 ENCOUNTER — Inpatient Hospital Stay (HOSPITAL_BASED_OUTPATIENT_CLINIC_OR_DEPARTMENT_OTHER): Payer: Medicare HMO | Admitting: Physician Assistant

## 2019-10-18 ENCOUNTER — Encounter: Payer: Self-pay | Admitting: Physician Assistant

## 2019-10-18 ENCOUNTER — Other Ambulatory Visit: Payer: Self-pay

## 2019-10-18 ENCOUNTER — Inpatient Hospital Stay: Payer: Medicare HMO

## 2019-10-18 VITALS — BP 135/96 | HR 68 | Temp 98.2°F | Resp 20 | Ht 73.0 in | Wt 186.9 lb

## 2019-10-18 DIAGNOSIS — C3431 Malignant neoplasm of lower lobe, right bronchus or lung: Secondary | ICD-10-CM

## 2019-10-18 DIAGNOSIS — C3491 Malignant neoplasm of unspecified part of right bronchus or lung: Secondary | ICD-10-CM | POA: Diagnosis not present

## 2019-10-18 DIAGNOSIS — Z5112 Encounter for antineoplastic immunotherapy: Secondary | ICD-10-CM | POA: Diagnosis not present

## 2019-10-18 LAB — CMP (CANCER CENTER ONLY)
ALT: 28 U/L (ref 0–44)
AST: 31 U/L (ref 15–41)
Albumin: 3.7 g/dL (ref 3.5–5.0)
Alkaline Phosphatase: 86 U/L (ref 38–126)
Anion gap: 12 (ref 5–15)
BUN: 10 mg/dL (ref 8–23)
CO2: 21 mmol/L — ABNORMAL LOW (ref 22–32)
Calcium: 9.3 mg/dL (ref 8.9–10.3)
Chloride: 102 mmol/L (ref 98–111)
Creatinine: 0.84 mg/dL (ref 0.61–1.24)
GFR, Est AFR Am: >60 mL/min (ref >60)
GFR, Estimated: >60 mL/min (ref >60)
Glucose, Bld: 97 mg/dL (ref 70–99)
Potassium: 4.8 mmol/L (ref 3.5–5.1)
Sodium: 135 mmol/L (ref 135–145)
Total Bilirubin: 1.1 mg/dL (ref 0.3–1.2)
Total Protein: 7 g/dL (ref 6.5–8.1)

## 2019-10-18 LAB — CBC WITH DIFFERENTIAL (CANCER CENTER ONLY)
Abs Immature Granulocytes: 0.02 10*3/uL (ref 0.00–0.07)
Basophils Absolute: 0.1 10*3/uL (ref 0.0–0.1)
Basophils Relative: 2 %
Eosinophils Absolute: 0.1 10*3/uL (ref 0.0–0.5)
Eosinophils Relative: 2 %
HCT: 41.5 % (ref 39.0–52.0)
Hemoglobin: 14.6 g/dL (ref 13.0–17.0)
Immature Granulocytes: 0 %
Lymphocytes Relative: 28 %
Lymphs Abs: 1.3 10*3/uL (ref 0.7–4.0)
MCH: 39.8 pg — ABNORMAL HIGH (ref 26.0–34.0)
MCHC: 35.2 g/dL (ref 30.0–36.0)
MCV: 113.1 fL — ABNORMAL HIGH (ref 80.0–100.0)
Monocytes Absolute: 0.5 10*3/uL (ref 0.1–1.0)
Monocytes Relative: 11 %
Neutro Abs: 2.6 10*3/uL (ref 1.7–7.7)
Neutrophils Relative %: 57 %
Platelet Count: 164 10*3/uL (ref 150–400)
RBC: 3.67 MIL/uL — ABNORMAL LOW (ref 4.22–5.81)
RDW: 14.3 % (ref 11.5–15.5)
WBC Count: 4.6 10*3/uL (ref 4.0–10.5)
nRBC: 0 % (ref 0.0–0.2)

## 2019-10-18 LAB — TSH: TSH: 26.071 u[IU]/mL — ABNORMAL HIGH (ref 0.320–4.118)

## 2019-10-18 MED ORDER — SODIUM CHLORIDE 0.9 % IV SOLN
10.0000 mg/kg | Freq: Once | INTRAVENOUS | Status: AC
  Administered 2019-10-18: 860 mg via INTRAVENOUS
  Filled 2019-10-18: qty 10

## 2019-10-18 MED ORDER — SODIUM CHLORIDE 0.9 % IV SOLN
Freq: Once | INTRAVENOUS | Status: AC
  Filled 2019-10-18: qty 250

## 2019-10-18 NOTE — Patient Instructions (Signed)
[facility] Discharge Instructions for Patients Receiving Chemotherapy  Today you received the following chemotherapy agents Imfinzi.  To help prevent nausea and vomiting after your treatment, we encourage you to take your nausea medication as directed. If you develop nausea and vomiting that is not controlled by your nausea medication, call the clinic.   BELOW ARE SYMPTOMS THAT SHOULD BE REPORTED IMMEDIATELY:  *FEVER GREATER THAN 100.5 F  *CHILLS WITH OR WITHOUT FEVER  NAUSEA AND VOMITING THAT IS NOT CONTROLLED WITH YOUR NAUSEA MEDICATION  *UNUSUAL SHORTNESS OF BREATH  *UNUSUAL BRUISING OR BLEEDING  TENDERNESS IN MOUTH AND THROAT WITH OR WITHOUT PRESENCE OF ULCERS  *URINARY PROBLEMS  *BOWEL PROBLEMS  UNUSUAL RASH Items with * indicate a potential emergency and should be followed up as soon as possible.  Feel free to call the clinic you have any questions or concerns. The clinic phone number is [phone].  Please show the [place] at check-in to the Emergency Department and triage nurse.

## 2019-10-22 DIAGNOSIS — J449 Chronic obstructive pulmonary disease, unspecified: Secondary | ICD-10-CM | POA: Diagnosis not present

## 2019-10-22 DIAGNOSIS — M1990 Unspecified osteoarthritis, unspecified site: Secondary | ICD-10-CM | POA: Diagnosis not present

## 2019-10-22 DIAGNOSIS — C3491 Malignant neoplasm of unspecified part of right bronchus or lung: Secondary | ICD-10-CM | POA: Diagnosis not present

## 2019-10-22 DIAGNOSIS — E7800 Pure hypercholesterolemia, unspecified: Secondary | ICD-10-CM | POA: Diagnosis not present

## 2019-10-22 DIAGNOSIS — I10 Essential (primary) hypertension: Secondary | ICD-10-CM | POA: Diagnosis not present

## 2019-10-22 DIAGNOSIS — E785 Hyperlipidemia, unspecified: Secondary | ICD-10-CM | POA: Diagnosis not present

## 2019-11-02 ENCOUNTER — Inpatient Hospital Stay: Payer: Medicare HMO

## 2019-11-02 ENCOUNTER — Inpatient Hospital Stay (HOSPITAL_BASED_OUTPATIENT_CLINIC_OR_DEPARTMENT_OTHER): Payer: Medicare HMO | Admitting: Internal Medicine

## 2019-11-02 ENCOUNTER — Other Ambulatory Visit: Payer: Self-pay

## 2019-11-02 ENCOUNTER — Inpatient Hospital Stay: Payer: Medicare HMO | Attending: Internal Medicine

## 2019-11-02 ENCOUNTER — Encounter: Payer: Self-pay | Admitting: Internal Medicine

## 2019-11-02 VITALS — BP 142/92 | HR 62 | Temp 98.2°F | Resp 20 | Ht 73.0 in | Wt 191.7 lb

## 2019-11-02 DIAGNOSIS — C3491 Malignant neoplasm of unspecified part of right bronchus or lung: Secondary | ICD-10-CM

## 2019-11-02 DIAGNOSIS — E032 Hypothyroidism due to medicaments and other exogenous substances: Secondary | ICD-10-CM

## 2019-11-02 DIAGNOSIS — I10 Essential (primary) hypertension: Secondary | ICD-10-CM

## 2019-11-02 DIAGNOSIS — Z5112 Encounter for antineoplastic immunotherapy: Secondary | ICD-10-CM | POA: Insufficient documentation

## 2019-11-02 DIAGNOSIS — C342 Malignant neoplasm of middle lobe, bronchus or lung: Secondary | ICD-10-CM | POA: Insufficient documentation

## 2019-11-02 DIAGNOSIS — C3431 Malignant neoplasm of lower lobe, right bronchus or lung: Secondary | ICD-10-CM

## 2019-11-02 LAB — CBC WITH DIFFERENTIAL (CANCER CENTER ONLY)
Abs Immature Granulocytes: 0.04 10*3/uL (ref 0.00–0.07)
Basophils Absolute: 0.1 10*3/uL (ref 0.0–0.1)
Basophils Relative: 2 %
Eosinophils Absolute: 0.1 10*3/uL (ref 0.0–0.5)
Eosinophils Relative: 2 %
HCT: 41 % (ref 39.0–52.0)
Hemoglobin: 14.7 g/dL (ref 13.0–17.0)
Immature Granulocytes: 1 %
Lymphocytes Relative: 25 %
Lymphs Abs: 1.2 10*3/uL (ref 0.7–4.0)
MCH: 40.3 pg — ABNORMAL HIGH (ref 26.0–34.0)
MCHC: 35.9 g/dL (ref 30.0–36.0)
MCV: 112.3 fL — ABNORMAL HIGH (ref 80.0–100.0)
Monocytes Absolute: 0.5 10*3/uL (ref 0.1–1.0)
Monocytes Relative: 11 %
Neutro Abs: 2.7 10*3/uL (ref 1.7–7.7)
Neutrophils Relative %: 59 %
Platelet Count: 198 10*3/uL (ref 150–400)
RBC: 3.65 MIL/uL — ABNORMAL LOW (ref 4.22–5.81)
RDW: 13.8 % (ref 11.5–15.5)
WBC Count: 4.6 10*3/uL (ref 4.0–10.5)
nRBC: 0 % (ref 0.0–0.2)

## 2019-11-02 LAB — CMP (CANCER CENTER ONLY)
ALT: 25 U/L (ref 0–44)
AST: 30 U/L (ref 15–41)
Albumin: 3.7 g/dL (ref 3.5–5.0)
Alkaline Phosphatase: 82 U/L (ref 38–126)
Anion gap: 12 (ref 5–15)
BUN: 9 mg/dL (ref 8–23)
CO2: 20 mmol/L — ABNORMAL LOW (ref 22–32)
Calcium: 9.2 mg/dL (ref 8.9–10.3)
Chloride: 103 mmol/L (ref 98–111)
Creatinine: 0.83 mg/dL (ref 0.61–1.24)
GFR, Est AFR Am: >60 mL/min (ref >60)
GFR, Estimated: >60 mL/min (ref >60)
Glucose, Bld: 77 mg/dL (ref 70–99)
Potassium: 4.7 mmol/L (ref 3.5–5.1)
Sodium: 135 mmol/L (ref 135–145)
Total Bilirubin: 0.6 mg/dL (ref 0.3–1.2)
Total Protein: 7.2 g/dL (ref 6.5–8.1)

## 2019-11-02 MED ORDER — SODIUM CHLORIDE 0.9 % IV SOLN
10.0000 mg/kg | Freq: Once | INTRAVENOUS | Status: AC
  Administered 2019-11-02: 860 mg via INTRAVENOUS
  Filled 2019-11-02: qty 7.2

## 2019-11-02 MED ORDER — SODIUM CHLORIDE 0.9 % IV SOLN
Freq: Once | INTRAVENOUS | Status: AC
  Filled 2019-11-02: qty 250

## 2019-11-02 NOTE — Progress Notes (Signed)
[place] [phone]   [phone]  OFFICE PROGRESS NOTE  [name], MD [place] [address]  DIAGNOSIS: Stage IIIA (T3, N1, M0) non-small cell lung cancer, squamous cell carcinoma diagnosed in July 2020 and presented with right middle lobe mass and right hilar adenopathy with postobstructive pneumonia and suspicious right pleural effusion.  PRIOR THERAPY:  Weekly concurrent chemoradiation with Carboplatin for an AUC of 2 and paclitaxel 45 mg/m2. First dose 11/23/2018. Status post 7 cycles.   CURRENT THERAPY: Consolidation immunotherapy with Imfinzi 10 mg/KG every 2 weeks.  First dose February 08, 2019.  Status post 19 cycles.  INTERVAL HISTORY: [facility] 69 y.o. male returns to the clinic today for follow-up visit.  The patient is feeling fine today with no concerning complaints except for the baseline shortness of breath.  He denied having any chest pain, cough or hemoptysis.  He denied having any fever or chills.  He has no nausea, vomiting, diarrhea or constipation.  He denied having any headache or visual changes.  He has no recent weight loss or night sweats.  The patient is here today for evaluation before starting cycle #20 of his treatment.   MEDICAL HISTORY: Past Medical History:  Diagnosis Date  . Arthritis   . Atrial fibrillation ([place]) 10/2018  . COPD with emphysema ([place])   . Dyspnea   . Hypertension   . lung ca dx'd 10/2018    ALLERGIES:  has No Known Allergies.  MEDICATIONS:  Current Outpatient Medications  Medication Sig Dispense Refill  . acetaminophen (TYLENOL) 500 MG tablet Take 1,000 mg by mouth every 6 (six) hours as needed for moderate pain or headache.    . albuterol (PROAIR HFA) 108 (90 Base) MCG/ACT inhaler Inhale 2 puffs into the lungs every 6 (six) hours as needed for wheezing or shortness of breath. 1 Inhaler 5  . apixaban (ELIQUIS) 5 MG TABS tablet Take 1 tablet (5 mg  total) by mouth 2 (two) times daily. 60 tablet 9  . atorvastatin (LIPITOR) 20 MG tablet Take 20 mg by mouth daily.    Marland Kitchen diltiazem (CARDIZEM CD) 360 MG 24 hr capsule Take 1 capsule (360 mg total) by mouth daily. 90 capsule 2  . levothyroxine (SYNTHROID) 75 MCG tablet Take 1 tablet (75 mcg total) by mouth daily before breakfast. 30 tablet 1  . metoprolol succinate (TOPROL XL) 25 MG 24 hr tablet Take 1 tablet (25 mg total) by mouth at bedtime. 30 tablet 11  . metoprolol succinate (TOPROL XL) 50 MG 24 hr tablet Take 1 tablet (50 mg total) by mouth at bedtime. Take with or immediately following a meal. 30 tablet 11  . Omega-3 Fatty Acids (FISH OIL) 1200 MG CAPS Take by mouth.    . prochlorperazine (COMPAZINE) 10 MG tablet Take 1 tablet (10 mg total) by mouth every 6 (six) hours as needed for nausea or vomiting. 30 tablet 0  . traZODone (DESYREL) 50 MG tablet TAKE 1 TABLET BY MOUTH EVERYDAY AT BEDTIME 90 tablet 1  . umeclidinium-vilanterol (ANORO ELLIPTA) 62.5-25 MCG/INH AEPB Inhale 1 puff into the lungs daily. 1 each 6   No current facility-administered medications for this visit.    SURGICAL HISTORY:  Past Surgical History:  Procedure Laterality Date  . CARDIOVERSION N/A 01/11/2019   Procedure: CARDIOVERSION;  Surgeon: Josue Hector, MD;  Location: [facility] ENDOSCOPY;  Service: Cardiovascular;  Laterality: N/A;  . KNEE ARTHROSCOPY  BIL   . TOE SURGERY     PINNED LEFT BIG TOE  . TONSILLECTOMY     T+A  AS CHILD  . TOTAL KNEE ARTHROPLASTY Left 08/19/2017   Procedure: TOTAL KNEE ARTHROPLASTY;  Surgeon: Renette Butters, MD;  Location: [place];  Service: Orthopedics;  Laterality: Left;  Marland Kitchen VIDEO BRONCHOSCOPY Bilateral 11/05/2018   Procedure: VIDEO BRONCHOSCOPY WITH FLUORO;  Surgeon: [name], MD;  Location: [facility] ENDOSCOPY;  Service: Cardiopulmonary;  Laterality: Bilateral;  . VIDEO BRONCHOSCOPY Bilateral 11/09/2018   Procedure: VIDEO BRONCHOSCOPY WITH FLUORO;  Surgeon: Rigoberto Noel, MD;  Location:  [facility];  Service: Cardiopulmonary;  Laterality: Bilateral;    REVIEW OF SYSTEMS:  A comprehensive review of systems was negative except for: Respiratory: positive for dyspnea on exertion   PHYSICAL EXAMINATION: General appearance: alert, cooperative and no distress Head: Normocephalic, without obvious abnormality, atraumatic Neck: no adenopathy, no JVD, supple, symmetrical, trachea midline and thyroid not enlarged, symmetric, no tenderness/mass/nodules Lymph nodes: Cervical, supraclavicular, and axillary nodes normal. Resp: clear to auscultation bilaterally Back: symmetric, no curvature. ROM normal. No CVA tenderness. Cardio: regular rate and rhythm, S1, S2 normal, no murmur, click, rub or gallop GI: soft, non-tender; bowel sounds normal; no masses,  no organomegaly Extremities: extremities normal, atraumatic, no cyanosis or edema  ECOG PERFORMANCE STATUS: 1 - Symptomatic but completely ambulatory  Blood pressure (!) 142/92, pulse 62, temperature 98.2 F (36.8 C), temperature source Temporal, resp. rate 20, height 6\' 1"  (1.854 m), weight 191 lb 11.2 oz (87 kg), SpO2 100 %.  LABORATORY DATA: Lab Results  Component Value Date   WBC 4.6 11/02/2019   HGB 14.7 11/02/2019   HCT 41.0 11/02/2019   MCV 112.3 (H) 11/02/2019   PLT 198 11/02/2019      Chemistry      Component Value Date/Time   NA 135 10/18/2019 0826   K 4.8 10/18/2019 0826   CL 102 10/18/2019 0826   CO2 21 (L) 10/18/2019 0826   BUN 10 10/18/2019 0826   CREATININE 0.84 10/18/2019 0826      Component Value Date/Time   CALCIUM 9.3 10/18/2019 0826   ALKPHOS 86 10/18/2019 0826   AST 31 10/18/2019 0826   ALT 28 10/18/2019 0826   BILITOT 1.1 10/18/2019 0826       RADIOGRAPHIC STUDIES: CT Chest W Contrast  Result Date: 10/15/2019 CLINICAL DATA:  Primary Cancer Type: Lung Imaging Indication: Routine surveillance Interval therapy since last imaging? Yes Initial Cancer Diagnosis Date: 11/09/2018; Established by:  Biopsy-proven Detailed Pathology: Stage IIIA non-small cell lung cancer, squamous cell carcinoma Primary Tumor location: Right middle lobe Chemotherapy: Yes; Ongoing?  No; Most recent administration: 12/2018 Immunotherapy?  Yes; Type: Imfinzi; Ongoing? Yes Radiation therapy? Yes; Date Range: 11/19/2018-01/05/2019; Target: Right perihilar EXAM: CT CHEST WITH CONTRAST TECHNIQUE: Multidetector CT imaging of the chest was performed during intravenous contrast administration. CONTRAST:  43mL OMNIPAQUE IOHEXOL 300 MG/ML  SOLN COMPARISON:  Most recent CT chest 07/28/2019.  10/29/2018 PET-CT. FINDINGS: Cardiovascular: Aortic and branch vessel atherosclerosis. Normal heart size with minimal anterior pericardial fluid, similar. Multivessel coronary artery atherosclerosis. No central pulmonary embolism, on this non-dedicated study. Mediastinum/Nodes: Small middle mediastinal nodes are unchanged. No hilar adenopathy. Lungs/Pleura: Similar small right pleural effusion. Centrilobular and paraseptal emphysema, moderate. Minimal exclusion of the left lung base laterally. No pulmonary nodule or mass. Right perihilar radiation induced consolidation is minimally decreased. No evidence of locally recurrent disease. Upper Abdomen: Normal imaged portions of the liver, spleen, stomach, pancreas, kidneys. Right greater  than left adrenal thickening is unchanged. Musculoskeletal: Probable sebaceous cyst about the right posterior chest wall, similar. Mild osteopenia. Degenerative or remote posttraumatic changes about the anterior left glenoid. Lower thoracic and lower cervical spondylosis. IMPRESSION: 1. Slight decrease in right perihilar radiation induced consolidation. No evidence of locally recurrent or metastatic disease. 2. Similar small right pleural effusion. 3. Aortic Atherosclerosis (ICD10-I70.0) and Emphysema (ICD10-J43.9). Coronary artery atherosclerosis. Electronically Signed   By: Abigail Miyamoto M.D.   On: 10/15/2019 11:52     ASSESSMENT AND PLAN: This is a very pleasant 69 years old white male with stage IIIA non-small cell lung cancer, squamous cell carcinoma.  The patient underwent a course of concurrent chemoradiation with weekly carboplatin and paclitaxel status post 7 cycles.  The patient tolerated his treatment well except for mild fatigue and odynophagia. He is currently undergoing consolidation treatment with immunotherapy with Imfinzi status post 19 cycles.   He continues to tolerate this treatment fairly well with no concerning complaints. I recommended for him to proceed with cycle #20 today as planned. For the hypothyroidism, he is currently on levothyroxine 50 mcg p.o. daily.  We will continue to monitor his TSH closely. For the history of atrial fibrillation, he is currently on treatment with Eliquis. He will come back for follow-up visit in 2 weeks for evaluation before the next cycle of his treatment. The patient was advised to call immediately if he has any concerning symptoms in the interval. The patient voices understanding of current disease status and treatment options and is in agreement with the current care plan. All questions were answered. The patient knows to call the clinic with any problems, questions or concerns. We can certainly see the patient much sooner if necessary.  Disclaimer: This note was dictated with voice recognition software. Similar sounding words can inadvertently be transcribed and may not be corrected upon review.

## 2019-11-02 NOTE — Patient Instructions (Signed)
Cancer Center Discharge Instructions for Patients Receiving Chemotherapy  Today you received the following chemotherapy agents: durvalumab.  To help prevent nausea and vomiting after your treatment, we encourage you to take your nausea medication as directed.   If you develop nausea and vomiting that is not controlled by your nausea medication, call the clinic.   BELOW ARE SYMPTOMS THAT SHOULD BE REPORTED IMMEDIATELY:  *FEVER GREATER THAN 100.5 F  *CHILLS WITH OR WITHOUT FEVER  NAUSEA AND VOMITING THAT IS NOT CONTROLLED WITH YOUR NAUSEA MEDICATION  *UNUSUAL SHORTNESS OF BREATH  *UNUSUAL BRUISING OR BLEEDING  TENDERNESS IN MOUTH AND THROAT WITH OR WITHOUT PRESENCE OF ULCERS  *URINARY PROBLEMS  *BOWEL PROBLEMS  UNUSUAL RASH Items with * indicate a potential emergency and should be followed up as soon as possible.  Feel free to call the clinic should you have any questions or concerns. The clinic phone number is [phone].  Please show the CHEMO ALERT CARD at check-in to the Emergency Department and triage nurse.   

## 2019-11-04 ENCOUNTER — Telehealth: Payer: Self-pay | Admitting: Internal Medicine

## 2019-11-04 NOTE — Telephone Encounter (Signed)
Scheduled appt per 7/6 los - pt to get an updated schedule next visit.

## 2019-11-08 DIAGNOSIS — E7800 Pure hypercholesterolemia, unspecified: Secondary | ICD-10-CM | POA: Diagnosis not present

## 2019-11-08 DIAGNOSIS — C3491 Malignant neoplasm of unspecified part of right bronchus or lung: Secondary | ICD-10-CM | POA: Diagnosis not present

## 2019-11-08 DIAGNOSIS — J449 Chronic obstructive pulmonary disease, unspecified: Secondary | ICD-10-CM | POA: Diagnosis not present

## 2019-11-08 DIAGNOSIS — M1990 Unspecified osteoarthritis, unspecified site: Secondary | ICD-10-CM | POA: Diagnosis not present

## 2019-11-08 DIAGNOSIS — I10 Essential (primary) hypertension: Secondary | ICD-10-CM | POA: Diagnosis not present

## 2019-11-08 DIAGNOSIS — E785 Hyperlipidemia, unspecified: Secondary | ICD-10-CM | POA: Diagnosis not present

## 2019-11-09 ENCOUNTER — Encounter: Payer: Self-pay | Admitting: Pulmonary Disease

## 2019-11-09 ENCOUNTER — Other Ambulatory Visit: Payer: Self-pay

## 2019-11-09 ENCOUNTER — Telehealth (HOSPITAL_COMMUNITY): Payer: Self-pay

## 2019-11-09 ENCOUNTER — Ambulatory Visit: Payer: Medicare HMO | Admitting: Pulmonary Disease

## 2019-11-09 VITALS — BP 136/84 | HR 74 | Temp 98.1°F | Ht 73.0 in | Wt 188.8 lb

## 2019-11-09 DIAGNOSIS — J449 Chronic obstructive pulmonary disease, unspecified: Secondary | ICD-10-CM | POA: Diagnosis not present

## 2019-11-09 NOTE — Progress Notes (Signed)
[facility]  Chief Complaint  Patient presents with  . Follow-up    shortness of breath with exertion    Constitutional:  BP 136/84 (BP Location: Right Arm, Cuff Size: Normal)   Pulse 74   Temp 98.1 F (36.7 C) (Oral)   Ht 6\' 1"  (1.854 m)   Wt 188 lb 12.8 oz (85.6 kg)   SpO2 99%   BMI 24.91 kg/m   Past Medical History:  Arthritis, Hypertension, NSCLC cancer dx July 2020, A fib  Brief Summary:  Joe Cole is a 69 y.o. male former smoker with COPD/emphysema.    Subjective:  He has noticed getting more short of breath with activity.  He has to sit and rest for a few minutes after bringing his garbage can back into his house.  He is not having much cough.  Not having wheeze, chest pain, sputum, hemoptysis, or leg swelling.  Uses proair occasionally and this helps.      Labs from 11/02/19 showed Hb 14.7, creatinine 0.83.  Physical Exam:   Appearance - well kempt   ENMT - no sinus tenderness, no oral exudate, no LAN, Mallampati 3 airway, no stridor  Respiratory - equal breath sounds bilaterally, no wheezing or rales  CV - s1s2 regular rate and rhythm, no murmurs  Ext - no clubbing, no edema  Skin - no rashes  Psych - normal mood and affect  Assessment/Plan:   COPD with emphysema. - continue anoro, prn albuterol - will arrange for referral to pulmonary rehab; suspect deconditioning playing role in his symptoms of dyspnea with exertion  Stage 3 NSCLC (squamous cell). - s/p chemotherapy with carboplatin, paclitaxel - started consolidation immunotherapy with imfinzi 02/08/19 - CT chest reviewed with him today - f/u with Dr. Julien Nordmann  Persistent A fib. - followed by Dr. Marlou Porch with cardiology  Hypothyroidism. - recently had thyroid medication adjusted  A total of  23 minutes spent addressing patient care issues on day of visit.   Follow up:   Patient Instructions  Will arrange for referral to pulmonary  rehab  Follow up in 6 months   Signature:  [name], MD [place] Pager: [phone] 11/09/2019, 12:06 PM  Flow Sheet     Pulmonary tests:    PFT 05/04/18 >> FEV1 2.35 (64%), FEV1% 59, TLC 9.00 (121%), RV 4.89 (197%), DLCO 60%  A1AT level 05/04/18 >> 167, MM  Chest imaging:   CT angio chest 10/20/18 >> Rt hilar LAN 2.2 cm, centrilobular and paraseptal emphysema, mass Rt perihilar region  PET scan 7/32/20 >> hypermetabolic Rt hila with postobstructive collapse of RML, nodule RML, small Rt effusion  CT chest 10/15/19 >> small Rt effusion, moderate centrilobular and paraseptal emphysema, changes of XRT Rt perihilar area  Cardiac tests:   Echo 11/06/18 >> EF 50 to 55%, severe LA/RA dilation   Medications:   Allergies as of 11/09/2019   No Known Allergies     Medication List       Accurate as of November 09, 2019 12:06 PM. If you have any questions, ask your nurse or doctor.        acetaminophen 500 MG tablet Commonly known as: TYLENOL Take 1,000 mg by mouth every 6 (six) hours as needed for moderate pain or headache.   albuterol 108 (90 Base) MCG/ACT inhaler Commonly known as: ProAir HFA Inhale 2 puffs into the lungs every 6 (six) hours as needed for wheezing or shortness of breath.   Anoro  Ellipta 62.5-25 MCG/INH Aepb Generic drug: umeclidinium-vilanterol Inhale 1 puff into the lungs daily.   apixaban 5 MG Tabs tablet Commonly known as: ELIQUIS Take 1 tablet (5 mg total) by mouth 2 (two) times daily.   atorvastatin 20 MG tablet Commonly known as: LIPITOR Take 20 mg by mouth daily.   diltiazem 360 MG 24 hr capsule Commonly known as: CARDIZEM CD Take 1 capsule (360 mg total) by mouth daily.   Fish Oil 1200 MG Caps Take by mouth.   levothyroxine 75 MCG tablet Commonly known as: SYNTHROID Take 1 tablet (75 mcg total) by mouth daily before breakfast.   metoprolol succinate 50 MG 24 hr tablet Commonly known as: Toprol XL Take 1  tablet (50 mg total) by mouth at bedtime. Take with or immediately following a meal.   metoprolol succinate 25 MG 24 hr tablet Commonly known as: Toprol XL Take 1 tablet (25 mg total) by mouth at bedtime.   prochlorperazine 10 MG tablet Commonly known as: COMPAZINE Take 1 tablet (10 mg total) by mouth every 6 (six) hours as needed for nausea or vomiting.   traZODone 50 MG tablet Commonly known as: DESYREL TAKE 1 TABLET BY MOUTH EVERYDAY AT BEDTIME       Past Surgical History:  He  has a past surgical history that includes Knee arthroscopy; Toe Surgery; Tonsillectomy; Total knee arthroplasty (Left, 08/19/2017); Video bronchoscopy (Bilateral, 11/05/2018); Video bronchoscopy (Bilateral, 11/09/2018); and Cardioversion (N/A, 01/11/2019).  Family History:  His family history includes Atrial fibrillation in his mother; Emphysema in his father; Pneumonia in his father.  Social History:  He  reports that he has been smoking cigarettes. He has a 69.00 pack-year smoking history. He has never used smokeless tobacco. He reports current alcohol use of about 15.0 - 16.0 standard drinks of alcohol per week. He reports that he does not use drugs.

## 2019-11-09 NOTE — Patient Instructions (Signed)
Will arrange for referral to pulmonary rehab  Follow up in 6 months

## 2019-11-09 NOTE — Telephone Encounter (Signed)
Pt insurance is active and benefits verified through [employer] Co-pay $30, DED 0/0 met, out of pocket $5,000/$5,000 met, co-insurance 0%. no pre-authorization required. Passport, Ron/[employer] 11/09/2019@1:37pm, REF# [id number] 

## 2019-11-15 ENCOUNTER — Inpatient Hospital Stay: Payer: Medicare HMO

## 2019-11-15 ENCOUNTER — Inpatient Hospital Stay: Payer: Medicare HMO | Admitting: Internal Medicine

## 2019-11-15 ENCOUNTER — Telehealth: Payer: Self-pay

## 2019-11-15 NOTE — Telephone Encounter (Signed)
Okay to skip this treatment and come back in 2 weeks for the next dose.  Thank you.

## 2019-11-15 NOTE — Telephone Encounter (Signed)
Called to see if patient was coming to his appointments and spoke to his wife.   She called this morning at 8 to cancel everything as he is not feeling well. She wanted to know if they should reschedule or if just skip this treatment and come on August 2.  I told her Dr. Julien Nordmann would have to decide this and someone would call her back.  Message routed to him and Abelina Bachelor.  Gardiner Rhyme, RN

## 2019-11-15 NOTE — Telephone Encounter (Signed)
Pt.notified

## 2019-11-17 ENCOUNTER — Encounter (HOSPITAL_COMMUNITY): Payer: Self-pay | Admitting: *Deleted

## 2019-11-17 NOTE — Progress Notes (Signed)
Received referral from [facility] for this pt to participate in pulmonary rehab with the the diagnosis of COPD with Chronic Bronchitis and Emphysema.  Pt completed Pulmonary Function Test on 1/6/202 which showed a FEV1 pred post of 64.  . Clinical review of pt follow up appt on 11/09/19 Pulmonary office note. Also reviewed pt oncology office note from 7/6.  Pt cancelled his 7/19 appt and infusion due to "not feeling well"  Pt next appt is on 11/29/19.  Will have pulmonary rehab staff check in with pt to see how he generally feels after his infusion and whether he can participate in group exercise.   Pt with Covid Risk Score - 5. Pt appropriate for scheduling for Pulmonary rehab.  Will forward to support staff for scheduling and verification of insurance eligibility/benefits with pt consent. Cherre Huger, BSN Cardiac and Training and development officer

## 2019-11-29 ENCOUNTER — Inpatient Hospital Stay: Payer: Medicare HMO

## 2019-11-29 ENCOUNTER — Inpatient Hospital Stay (HOSPITAL_BASED_OUTPATIENT_CLINIC_OR_DEPARTMENT_OTHER): Payer: Medicare HMO | Admitting: Internal Medicine

## 2019-11-29 ENCOUNTER — Other Ambulatory Visit: Payer: Self-pay

## 2019-11-29 ENCOUNTER — Encounter: Payer: Self-pay | Admitting: Internal Medicine

## 2019-11-29 ENCOUNTER — Inpatient Hospital Stay: Payer: Medicare HMO | Attending: Internal Medicine

## 2019-11-29 VITALS — BP 117/80 | HR 73 | Temp 99.0°F | Resp 17 | Ht 73.0 in | Wt 190.2 lb

## 2019-11-29 DIAGNOSIS — C3491 Malignant neoplasm of unspecified part of right bronchus or lung: Secondary | ICD-10-CM | POA: Diagnosis not present

## 2019-11-29 DIAGNOSIS — C342 Malignant neoplasm of middle lobe, bronchus or lung: Secondary | ICD-10-CM | POA: Diagnosis present

## 2019-11-29 DIAGNOSIS — Z79899 Other long term (current) drug therapy: Secondary | ICD-10-CM | POA: Insufficient documentation

## 2019-11-29 DIAGNOSIS — C3431 Malignant neoplasm of lower lobe, right bronchus or lung: Secondary | ICD-10-CM

## 2019-11-29 DIAGNOSIS — I10 Essential (primary) hypertension: Secondary | ICD-10-CM

## 2019-11-29 DIAGNOSIS — Z5112 Encounter for antineoplastic immunotherapy: Secondary | ICD-10-CM

## 2019-11-29 LAB — CMP (CANCER CENTER ONLY)
ALT: 27 U/L (ref 0–44)
AST: 33 U/L (ref 15–41)
Albumin: 3.7 g/dL (ref 3.5–5.0)
Alkaline Phosphatase: 78 U/L (ref 38–126)
Anion gap: 9 (ref 5–15)
BUN: 11 mg/dL (ref 8–23)
CO2: 23 mmol/L (ref 22–32)
Calcium: 9.8 mg/dL (ref 8.9–10.3)
Chloride: 103 mmol/L (ref 98–111)
Creatinine: 0.89 mg/dL (ref 0.61–1.24)
GFR, Est AFR Am: >60 mL/min (ref >60)
GFR, Estimated: >60 mL/min (ref >60)
Glucose, Bld: 117 mg/dL — ABNORMAL HIGH (ref 70–99)
Potassium: 4.6 mmol/L (ref 3.5–5.1)
Sodium: 135 mmol/L (ref 135–145)
Total Bilirubin: 0.9 mg/dL (ref 0.3–1.2)
Total Protein: 6.9 g/dL (ref 6.5–8.1)

## 2019-11-29 LAB — CBC WITH DIFFERENTIAL (CANCER CENTER ONLY)
Abs Immature Granulocytes: 0.02 10*3/uL (ref 0.00–0.07)
Basophils Absolute: 0.1 10*3/uL (ref 0.0–0.1)
Basophils Relative: 2 %
Eosinophils Absolute: 0.1 10*3/uL (ref 0.0–0.5)
Eosinophils Relative: 2 %
HCT: 39.6 % (ref 39.0–52.0)
Hemoglobin: 14.3 g/dL (ref 13.0–17.0)
Immature Granulocytes: 0 %
Lymphocytes Relative: 29 %
Lymphs Abs: 1.5 10*3/uL (ref 0.7–4.0)
MCH: 41 pg — ABNORMAL HIGH (ref 26.0–34.0)
MCHC: 36.1 g/dL — ABNORMAL HIGH (ref 30.0–36.0)
MCV: 113.5 fL — ABNORMAL HIGH (ref 80.0–100.0)
Monocytes Absolute: 0.5 10*3/uL (ref 0.1–1.0)
Monocytes Relative: 9 %
Neutro Abs: 3.1 10*3/uL (ref 1.7–7.7)
Neutrophils Relative %: 58 %
Platelet Count: 190 10*3/uL (ref 150–400)
RBC: 3.49 MIL/uL — ABNORMAL LOW (ref 4.22–5.81)
RDW: 13.6 % (ref 11.5–15.5)
WBC Count: 5.3 10*3/uL (ref 4.0–10.5)
nRBC: 0 % (ref 0.0–0.2)

## 2019-11-29 LAB — TSH: TSH: 20.132 u[IU]/mL — ABNORMAL HIGH (ref 0.320–4.118)

## 2019-11-29 MED ORDER — SODIUM CHLORIDE 0.9 % IV SOLN
Freq: Once | INTRAVENOUS | Status: AC
  Filled 2019-11-29: qty 250

## 2019-11-29 MED ORDER — SODIUM CHLORIDE 0.9 % IV SOLN
10.0000 mg/kg | Freq: Once | INTRAVENOUS | Status: AC
  Administered 2019-11-29: 860 mg via INTRAVENOUS
  Filled 2019-11-29: qty 10

## 2019-11-29 NOTE — Progress Notes (Signed)
[place] [phone]   [phone]  OFFICE PROGRESS NOTE  [name], MD [place] [address]  DIAGNOSIS: Stage IIIA (T3, N1, M0) non-small cell lung cancer, squamous cell carcinoma diagnosed in July 2020 and presented with right middle lobe mass and right hilar adenopathy with postobstructive pneumonia and suspicious right pleural effusion.  PRIOR THERAPY:  Weekly concurrent chemoradiation with Carboplatin for an AUC of 2 and paclitaxel 45 mg/m2. First dose 11/23/2018. Status post 7 cycles.   CURRENT THERAPY: Consolidation immunotherapy with Imfinzi 10 mg/KG every 2 weeks.  First dose February 08, 2019.  Status post 20 cycles.  INTERVAL HISTORY: Joe Cole 69 y.o. male returns to the clinic today for follow-up visit.  The patient is feeling fine today with no concerning complaints.  He missed the last cycle of his treatment because he was not feeling well with upset stomach.  He denied having any current chest pain, shortness of breath, cough or hemoptysis.  He denied having any fever or chills.  He has no nausea, vomiting, diarrhea or constipation.  He denied having any headache or visual changes.  He is here today for evaluation and to resume his treatment with consolidation immunotherapy.   MEDICAL HISTORY: Past Medical History:  Diagnosis Date   Arthritis    Atrial fibrillation ([place]) 10/2018   COPD with emphysema ([place])    Dyspnea    Hypertension    lung ca dx'd 10/2018    ALLERGIES:  has No Known Allergies.  MEDICATIONS:  Current Outpatient Medications  Medication Sig Dispense Refill   acetaminophen (TYLENOL) 500 MG tablet Take 1,000 mg by mouth every 6 (six) hours as needed for moderate pain or headache.     albuterol (PROAIR HFA) 108 (90 Base) MCG/ACT inhaler Inhale 2 puffs into the lungs every 6 (six) hours as needed for wheezing or shortness of breath. 1 Inhaler 5   apixaban  (ELIQUIS) 5 MG TABS tablet Take 1 tablet (5 mg total) by mouth 2 (two) times daily. 60 tablet 9   atorvastatin (LIPITOR) 20 MG tablet Take 20 mg by mouth daily.     diltiazem (CARDIZEM CD) 360 MG 24 hr capsule Take 1 capsule (360 mg total) by mouth daily. 90 capsule 2   levothyroxine (SYNTHROID) 50 MCG tablet Take 50 mcg by mouth daily.     levothyroxine (SYNTHROID) 75 MCG tablet Take 1 tablet (75 mcg total) by mouth daily before breakfast. 30 tablet 1   metoprolol succinate (TOPROL XL) 25 MG 24 hr tablet Take 1 tablet (25 mg total) by mouth at bedtime. 30 tablet 11   metoprolol succinate (TOPROL XL) 50 MG 24 hr tablet Take 1 tablet (50 mg total) by mouth at bedtime. Take with or immediately following a meal. 30 tablet 11   Omega-3 Fatty Acids (FISH OIL) 1200 MG CAPS Take by mouth.     prochlorperazine (COMPAZINE) 10 MG tablet Take 1 tablet (10 mg total) by mouth every 6 (six) hours as needed for nausea or vomiting. 30 tablet 0   traZODone (DESYREL) 50 MG tablet TAKE 1 TABLET BY MOUTH EVERYDAY AT BEDTIME 90 tablet 1   umeclidinium-vilanterol (ANORO ELLIPTA) 62.5-25 MCG/INH AEPB Inhale 1 puff into the lungs daily. (Patient not taking: Reported on 11/09/2019) 1 each 6   No current facility-administered medications for this visit.    SURGICAL HISTORY:  Past Surgical History:  Procedure Laterality Date   CARDIOVERSION N/A 01/11/2019  Procedure: CARDIOVERSION;  Surgeon: Josue Hector, MD;  Location: [facility] ENDOSCOPY;  Service: Cardiovascular;  Laterality: N/A;   KNEE ARTHROSCOPY     BIL    TOE SURGERY     PINNED LEFT BIG TOE   TONSILLECTOMY     T+A  AS CHILD   TOTAL KNEE ARTHROPLASTY Left 08/19/2017   Procedure: TOTAL KNEE ARTHROPLASTY;  Surgeon: Renette Butters, MD;  Location: [facility];  Service: Orthopedics;  Laterality: Left;   VIDEO BRONCHOSCOPY Bilateral 11/05/2018   Procedure: VIDEO BRONCHOSCOPY WITH FLUORO;  Surgeon: [name], MD;  Location: [facility];  Service:  Cardiopulmonary;  Laterality: Bilateral;   VIDEO BRONCHOSCOPY Bilateral 11/09/2018   Procedure: VIDEO BRONCHOSCOPY WITH FLUORO;  Surgeon: Rigoberto Noel, MD;  Location: [facility];  Service: Cardiopulmonary;  Laterality: Bilateral;    REVIEW OF SYSTEMS:  A comprehensive review of systems was negative except for: Respiratory: positive for dyspnea on exertion   PHYSICAL EXAMINATION: General appearance: alert, cooperative and no distress Head: Normocephalic, without obvious abnormality, atraumatic Neck: no adenopathy, no JVD, supple, symmetrical, trachea midline and thyroid not enlarged, symmetric, no tenderness/mass/nodules Lymph nodes: Cervical, supraclavicular, and axillary nodes normal. Resp: clear to auscultation bilaterally Back: symmetric, no curvature. ROM normal. No CVA tenderness. Cardio: regular rate and rhythm, S1, S2 normal, no murmur, click, rub or gallop GI: soft, non-tender; bowel sounds normal; no masses,  no organomegaly Extremities: extremities normal, atraumatic, no cyanosis or edema  ECOG PERFORMANCE STATUS: 1 - Symptomatic but completely ambulatory  Blood pressure 117/80, pulse 73, temperature 99 F (37.2 C), temperature source Temporal, resp. rate 17, height 6\' 1"  (1.854 m), weight 190 lb 3.2 oz (86.3 kg), SpO2 99 %.  LABORATORY DATA: Lab Results  Component Value Date   WBC 5.3 11/29/2019   HGB 14.3 11/29/2019   HCT 39.6 11/29/2019   MCV 113.5 (H) 11/29/2019   PLT 190 11/29/2019      Chemistry      Component Value Date/Time   NA 135 11/02/2019 0741   K 4.7 11/02/2019 0741   CL 103 11/02/2019 0741   CO2 20 (L) 11/02/2019 0741   BUN 9 11/02/2019 0741   CREATININE 0.83 11/02/2019 0741      Component Value Date/Time   CALCIUM 9.2 11/02/2019 0741   ALKPHOS 82 11/02/2019 0741   AST 30 11/02/2019 0741   ALT 25 11/02/2019 0741   BILITOT 0.6 11/02/2019 0741       RADIOGRAPHIC STUDIES: No results found.  ASSESSMENT AND PLAN: This is a very pleasant  69 years old white male with stage IIIA non-small cell lung cancer, squamous cell carcinoma.  The patient underwent a course of concurrent chemoradiation with weekly carboplatin and paclitaxel status post 7 cycles.  The patient tolerated his treatment well except for mild fatigue and odynophagia. He is currently undergoing consolidation treatment with immunotherapy with Imfinzi status post 20 cycles.   The patient is tolerating his treatment with immunotherapy fairly well. I recommended for him to proceed with cycle #21 today as planned. For the hypothyroidism, he is currently on levothyroxine 75 mcg p.o. daily.  We will continue to monitor his TSH closely. For the history of atrial fibrillation, he is currently on treatment with Eliquis. He will come back for follow-up visit in 2 weeks for evaluation before the next cycle of his treatment. The patient was advised to call immediately if he has any concerning symptoms in the interval. The patient voices understanding of current disease status and treatment options and  is in agreement with the current care plan. All questions were answered. The patient knows to call the clinic with any problems, questions or concerns. We can certainly see the patient much sooner if necessary.  Disclaimer: This note was dictated with voice recognition software. Similar sounding words can inadvertently be transcribed and may not be corrected upon review.

## 2019-11-29 NOTE — Patient Instructions (Signed)
[facility] Discharge Instructions for Patients Receiving Chemotherapy  Today you received the following chemotherapy agents: durvalumab.  To help prevent nausea and vomiting after your treatment, we encourage you to take your nausea medication as directed.   If you develop nausea and vomiting that is not controlled by your nausea medication, call the clinic.   BELOW ARE SYMPTOMS THAT SHOULD BE REPORTED IMMEDIATELY:  *FEVER GREATER THAN 100.5 F  *CHILLS WITH OR WITHOUT FEVER  NAUSEA AND VOMITING THAT IS NOT CONTROLLED WITH YOUR NAUSEA MEDICATION  *UNUSUAL SHORTNESS OF BREATH  *UNUSUAL BRUISING OR BLEEDING  TENDERNESS IN MOUTH AND THROAT WITH OR WITHOUT PRESENCE OF ULCERS  *URINARY PROBLEMS  *BOWEL PROBLEMS  UNUSUAL RASH Items with * indicate a potential emergency and should be followed up as soon as possible.  Feel free to call the clinic should you have any questions or concerns. The clinic phone number is [phone].  Please show the CHEMO ALERT CARD at check-in to the Emergency Department and triage nurse.   

## 2019-11-30 ENCOUNTER — Telehealth (HOSPITAL_COMMUNITY): Payer: Self-pay

## 2019-12-03 ENCOUNTER — Encounter (HOSPITAL_COMMUNITY): Payer: Self-pay

## 2019-12-03 ENCOUNTER — Telehealth: Payer: Self-pay | Admitting: *Deleted

## 2019-12-03 ENCOUNTER — Other Ambulatory Visit: Payer: Self-pay

## 2019-12-03 NOTE — Telephone Encounter (Signed)
Pt needed medication sent to new pharmacy, [facility]. This is a A-Fib pt. Please address

## 2019-12-03 NOTE — Telephone Encounter (Signed)
Asking for Dr. Julien Nordmann to refill his metoprolol 25 mg and 50 mg and send to [facility]. Forward this request to [employer], [place] who ordered this originally.

## 2019-12-06 ENCOUNTER — Encounter (HOSPITAL_COMMUNITY): Payer: Self-pay

## 2019-12-06 ENCOUNTER — Other Ambulatory Visit (HOSPITAL_COMMUNITY): Payer: Self-pay

## 2019-12-06 MED ORDER — METOPROLOL SUCCINATE ER 25 MG PO TB24: 25.0000 mg | ORAL_TABLET | Freq: Every day | ORAL | 11 refills | Status: DC

## 2019-12-06 MED ORDER — METOPROLOL SUCCINATE ER 50 MG PO TB24: 50.0000 mg | ORAL_TABLET | Freq: Every day | ORAL | 11 refills | Status: DC

## 2019-12-11 ENCOUNTER — Other Ambulatory Visit: Payer: Self-pay | Admitting: Internal Medicine

## 2019-12-13 ENCOUNTER — Inpatient Hospital Stay: Payer: Medicare HMO

## 2019-12-13 ENCOUNTER — Encounter: Payer: Self-pay | Admitting: Internal Medicine

## 2019-12-13 ENCOUNTER — Other Ambulatory Visit: Payer: Self-pay | Admitting: Internal Medicine

## 2019-12-13 ENCOUNTER — Other Ambulatory Visit: Payer: Self-pay

## 2019-12-13 ENCOUNTER — Inpatient Hospital Stay (HOSPITAL_BASED_OUTPATIENT_CLINIC_OR_DEPARTMENT_OTHER): Payer: Medicare HMO | Admitting: Internal Medicine

## 2019-12-13 VITALS — BP 145/86 | HR 76 | Temp 98.7°F | Resp 18 | Ht 73.0 in | Wt 189.2 lb

## 2019-12-13 DIAGNOSIS — I10 Essential (primary) hypertension: Secondary | ICD-10-CM

## 2019-12-13 DIAGNOSIS — Z5112 Encounter for antineoplastic immunotherapy: Secondary | ICD-10-CM | POA: Diagnosis not present

## 2019-12-13 DIAGNOSIS — C3491 Malignant neoplasm of unspecified part of right bronchus or lung: Secondary | ICD-10-CM

## 2019-12-13 DIAGNOSIS — C3431 Malignant neoplasm of lower lobe, right bronchus or lung: Secondary | ICD-10-CM

## 2019-12-13 LAB — CMP (CANCER CENTER ONLY)
ALT: 45 U/L — ABNORMAL HIGH (ref 0–44)
AST: 58 U/L — ABNORMAL HIGH (ref 15–41)
Albumin: 3.7 g/dL (ref 3.5–5.0)
Alkaline Phosphatase: 85 U/L (ref 38–126)
Anion gap: 13 (ref 5–15)
BUN: 9 mg/dL (ref 8–23)
CO2: 20 mmol/L — ABNORMAL LOW (ref 22–32)
Calcium: 9.8 mg/dL (ref 8.9–10.3)
Chloride: 101 mmol/L (ref 98–111)
Creatinine: 0.84 mg/dL (ref 0.61–1.24)
GFR, Est AFR Am: >60 mL/min (ref >60)
GFR, Estimated: >60 mL/min (ref >60)
Glucose, Bld: 82 mg/dL (ref 70–99)
Potassium: 4.6 mmol/L (ref 3.5–5.1)
Sodium: 134 mmol/L — ABNORMAL LOW (ref 135–145)
Total Bilirubin: 0.9 mg/dL (ref 0.3–1.2)
Total Protein: 7.1 g/dL (ref 6.5–8.1)

## 2019-12-13 LAB — CBC WITH DIFFERENTIAL (CANCER CENTER ONLY)
Abs Immature Granulocytes: 0.02 10*3/uL (ref 0.00–0.07)
Basophils Absolute: 0.1 10*3/uL (ref 0.0–0.1)
Basophils Relative: 2 %
Eosinophils Absolute: 0.1 10*3/uL (ref 0.0–0.5)
Eosinophils Relative: 2 %
HCT: 40.1 % (ref 39.0–52.0)
Hemoglobin: 14.6 g/dL (ref 13.0–17.0)
Immature Granulocytes: 0 %
Lymphocytes Relative: 26 %
Lymphs Abs: 1.2 10*3/uL (ref 0.7–4.0)
MCH: 40.6 pg — ABNORMAL HIGH (ref 26.0–34.0)
MCHC: 36.4 g/dL — ABNORMAL HIGH (ref 30.0–36.0)
MCV: 111.4 fL — ABNORMAL HIGH (ref 80.0–100.0)
Monocytes Absolute: 0.5 10*3/uL (ref 0.1–1.0)
Monocytes Relative: 11 %
Neutro Abs: 2.8 10*3/uL (ref 1.7–7.7)
Neutrophils Relative %: 59 %
Platelet Count: 173 10*3/uL (ref 150–400)
RBC: 3.6 MIL/uL — ABNORMAL LOW (ref 4.22–5.81)
RDW: 13.2 % (ref 11.5–15.5)
WBC Count: 4.7 10*3/uL (ref 4.0–10.5)
nRBC: 0 % (ref 0.0–0.2)

## 2019-12-13 MED ORDER — SODIUM CHLORIDE 0.9 % IV SOLN
Freq: Once | INTRAVENOUS | Status: AC
  Filled 2019-12-13: qty 250

## 2019-12-13 MED ORDER — SODIUM CHLORIDE 0.9 % IV SOLN
10.0000 mg/kg | Freq: Once | INTRAVENOUS | Status: AC
  Administered 2019-12-13: 860 mg via INTRAVENOUS
  Filled 2019-12-13: qty 7.2

## 2019-12-13 NOTE — Patient Instructions (Signed)
[facility] Discharge Instructions for Patients Receiving Chemotherapy  Today you received the following chemotherapy agents Durvalumab (IMFINZI).  To help prevent nausea and vomiting after your treatment, we encourage you to take your nausea medication as prescribed.   If you develop nausea and vomiting that is not controlled by your nausea medication, call the clinic.   BELOW ARE SYMPTOMS THAT SHOULD BE REPORTED IMMEDIATELY:  *FEVER GREATER THAN 100.5 F  *CHILLS WITH OR WITHOUT FEVER  NAUSEA AND VOMITING THAT IS NOT CONTROLLED WITH YOUR NAUSEA MEDICATION  *UNUSUAL SHORTNESS OF BREATH  *UNUSUAL BRUISING OR BLEEDING  TENDERNESS IN MOUTH AND THROAT WITH OR WITHOUT PRESENCE OF ULCERS  *URINARY PROBLEMS  *BOWEL PROBLEMS  UNUSUAL RASH Items with * indicate a potential emergency and should be followed up as soon as possible.  Feel free to call the clinic should you have any questions or concerns. The clinic phone number is [phone].  Please show the [facility] at check-in to the Emergency Department and triage nurse.

## 2019-12-13 NOTE — Progress Notes (Signed)
[facility] [phone]   [phone]  OFFICE PROGRESS NOTE  [name], MD [place] [address]  DIAGNOSIS: Stage IIIA (T3, N1, M0) non-small cell lung cancer, squamous cell carcinoma diagnosed in July 2020 and presented with right middle lobe mass and right hilar adenopathy with postobstructive pneumonia and suspicious right pleural effusion.  PRIOR THERAPY:  Weekly concurrent chemoradiation with Carboplatin for an AUC of 2 and paclitaxel 45 mg/m2. First dose 11/23/2018. Status post 7 cycles.   CURRENT THERAPY: Consolidation immunotherapy with Imfinzi 10 mg/KG every 2 weeks.  First dose February 08, 2019.  Status post 21 cycles.  INTERVAL HISTORY: Joe Cole 69 y.o. male returns to the clinic today for follow-up visit.  The patient is feeling fine today with no concerning complaints except for 1 or 2 episodes of lightheadedness especially in the morning.  He is feeling fine today.  He denied having any current chest pain, shortness of breath, cough or hemoptysis.  He denied having any fever or chills.  He has no nausea, vomiting, diarrhea or constipation.  The patient denied having any headache or visual changes.  He is here today for evaluation before starting cycle #22.  MEDICAL HISTORY: Past Medical History:  Diagnosis Date  . Arthritis   . Atrial fibrillation ([place]) 10/2018  . COPD with emphysema ([place])   . Dyspnea   . Hypertension   . lung ca dx'd 10/2018    ALLERGIES:  has No Known Allergies.  MEDICATIONS:  Current Outpatient Medications  Medication Sig Dispense Refill  . acetaminophen (TYLENOL) 500 MG tablet Take 1,000 mg by mouth every 6 (six) hours as needed for moderate pain or headache.    . albuterol (PROAIR HFA) 108 (90 Base) MCG/ACT inhaler Inhale 2 puffs into the lungs every 6 (six) hours as needed for wheezing or shortness of breath. 1 Inhaler 5  . apixaban (ELIQUIS) 5 MG TABS tablet Take  1 tablet (5 mg total) by mouth 2 (two) times daily. 60 tablet 9  . atorvastatin (LIPITOR) 20 MG tablet Take 20 mg by mouth daily.    Marland Kitchen diltiazem (CARDIZEM CD) 360 MG 24 hr capsule Take 1 capsule (360 mg total) by mouth daily. 90 capsule 2  . levothyroxine (SYNTHROID) 50 MCG tablet TAKE 1 TABLET BY MOUTH DAILY BEFORE BREAKFAST 90 tablet 0  . levothyroxine (SYNTHROID) 75 MCG tablet Take 1 tablet (75 mcg total) by mouth daily before breakfast. 30 tablet 1  . metoprolol succinate (TOPROL XL) 25 MG 24 hr tablet Take 1 tablet (25 mg total) by mouth at bedtime. 30 tablet 11  . metoprolol succinate (TOPROL XL) 50 MG 24 hr tablet Take 1 tablet (50 mg total) by mouth at bedtime. Take with or immediately following a meal. 30 tablet 11  . Omega-3 Fatty Acids (FISH OIL) 1200 MG CAPS Take by mouth.    . prochlorperazine (COMPAZINE) 10 MG tablet Take 1 tablet (10 mg total) by mouth every 6 (six) hours as needed for nausea or vomiting. 30 tablet 0  . traZODone (DESYREL) 50 MG tablet TAKE 1 TABLET BY MOUTH EVERYDAY AT BEDTIME 90 tablet 1  . umeclidinium-vilanterol (ANORO ELLIPTA) 62.5-25 MCG/INH AEPB Inhale 1 puff into the lungs daily. (Patient not taking: Reported on 11/09/2019) 1 each 6   No current facility-administered medications for this visit.    SURGICAL HISTORY:  Past Surgical History:  Procedure Laterality Date  . CARDIOVERSION N/A 01/11/2019  Procedure: CARDIOVERSION;  Surgeon: Josue Hector, MD;  Location: [facility] ENDOSCOPY;  Service: Cardiovascular;  Laterality: N/A;  . KNEE ARTHROSCOPY     BIL   . TOE SURGERY     PINNED LEFT BIG TOE  . TONSILLECTOMY     T+A  AS CHILD  . TOTAL KNEE ARTHROPLASTY Left 08/19/2017   Procedure: TOTAL KNEE ARTHROPLASTY;  Surgeon: Renette Butters, MD;  Location: [place];  Service: Orthopedics;  Laterality: Left;  Marland Kitchen VIDEO BRONCHOSCOPY Bilateral 11/05/2018   Procedure: VIDEO BRONCHOSCOPY WITH FLUORO;  Surgeon: [name], MD;  Location: [facility] ENDOSCOPY;  Service:  Cardiopulmonary;  Laterality: Bilateral;  . VIDEO BRONCHOSCOPY Bilateral 11/09/2018   Procedure: VIDEO BRONCHOSCOPY WITH FLUORO;  Surgeon: Rigoberto Noel, MD;  Location: [place];  Service: Cardiopulmonary;  Laterality: Bilateral;    REVIEW OF SYSTEMS:  A comprehensive review of systems was negative except for: Neurological: positive for dizziness   PHYSICAL EXAMINATION: General appearance: alert, cooperative and no distress Head: Normocephalic, without obvious abnormality, atraumatic Neck: no adenopathy, no JVD, supple, symmetrical, trachea midline and thyroid not enlarged, symmetric, no tenderness/mass/nodules Lymph nodes: Cervical, supraclavicular, and axillary nodes normal. Resp: clear to auscultation bilaterally Back: symmetric, no curvature. ROM normal. No CVA tenderness. Cardio: regular rate and rhythm, S1, S2 normal, no murmur, click, rub or gallop GI: soft, non-tender; bowel sounds normal; no masses,  no organomegaly Extremities: extremities normal, atraumatic, no cyanosis or edema  ECOG PERFORMANCE STATUS: 1 - Symptomatic but completely ambulatory  Blood pressure (!) 145/86, pulse 76, temperature 98.7 F (37.1 C), temperature source Temporal, resp. rate 18, height 6\' 1"  (1.854 m), weight 189 lb 3.2 oz (85.8 kg), SpO2 100 %.  LABORATORY DATA: Lab Results  Component Value Date   WBC 4.7 12/13/2019   HGB 14.6 12/13/2019   HCT 40.1 12/13/2019   MCV 111.4 (H) 12/13/2019   PLT 173 12/13/2019      Chemistry      Component Value Date/Time   NA 135 11/29/2019 1244   K 4.6 11/29/2019 1244   CL 103 11/29/2019 1244   CO2 23 11/29/2019 1244   BUN 11 11/29/2019 1244   CREATININE 0.89 11/29/2019 1244      Component Value Date/Time   CALCIUM 9.8 11/29/2019 1244   ALKPHOS 78 11/29/2019 1244   AST 33 11/29/2019 1244   ALT 27 11/29/2019 1244   BILITOT 0.9 11/29/2019 1244       RADIOGRAPHIC STUDIES: No results found.  ASSESSMENT AND PLAN: This is a very pleasant 69  years old white male with stage IIIA non-small cell lung cancer, squamous cell carcinoma.  The patient underwent a course of concurrent chemoradiation with weekly carboplatin and paclitaxel status post 7 cycles.  The patient tolerated his treatment well except for mild fatigue and odynophagia. The patient is currently undergoing consolidation treatment with immunotherapy with Imfinzi every 2 weeks status post 21 cycles.  He has been tolerating this treatment well with no concerning adverse effects. I recommended for him to proceed with cycle #22 today as planned. For the hypothyroidism, he is currently on levothyroxine 75 mcg p.o. daily.  We will continue to monitor his TSH closely. For the history of atrial fibrillation, he is currently on treatment with Eliquis. He will come back for follow-up visit in 2 weeks for evaluation before the next cycle of his treatment. The patient was advised to call immediately if he has any concerning symptoms in the interval. The patient voices understanding of current disease status and  treatment options and is in agreement with the current care plan. All questions were answered. The patient knows to call the clinic with any problems, questions or concerns. We can certainly see the patient much sooner if necessary.  Disclaimer: This note was dictated with voice recognition software. Similar sounding words can inadvertently be transcribed and may not be corrected upon review.

## 2019-12-13 NOTE — Patient Instructions (Signed)
Steps to Quit Smoking Smoking tobacco is the leading cause of preventable death. It can affect almost every organ in the body. Smoking puts you and people around you at risk for many serious, long-lasting (chronic) diseases. Quitting smoking can be hard, but it is one of the best things that you can do for your health. It is never too late to quit. How do I get ready to quit? When you decide to quit smoking, make a plan to help you succeed. Before you quit:  Pick a date to quit. Set a date within the next 2 weeks to give you time to prepare.  Write down the reasons why you are quitting. Keep this list in places where you will see it often.  Tell your family, friends, and co-workers that you are quitting. Their support is important.  Talk with your doctor about the choices that may help you quit.  Find out if your health insurance will pay for these treatments.  Know the people, places, things, and activities that make you want to smoke (triggers). Avoid them. What first steps can I take to quit smoking?  Throw away all cigarettes at home, at work, and in your car.  Throw away the things that you use when you smoke, such as ashtrays and lighters.  Clean your car. Make sure to empty the ashtray.  Clean your home, including curtains and carpets. What can I do to help me quit smoking? Talk with your doctor about taking medicines and seeing a counselor at the same time. You are more likely to succeed when you do both.  If you are pregnant or breastfeeding, talk with your doctor about counseling or other ways to quit smoking. Do not take medicine to help you quit smoking unless your doctor tells you to do so. To quit smoking: Quit right away  Quit smoking totally, instead of slowly cutting back on how much you smoke over a period of time.  Go to counseling. You are more likely to quit if you go to counseling sessions regularly. Take medicine You may take medicines to help you quit. Some  medicines need a prescription, and some you can buy over-the-counter. Some medicines may contain a drug called nicotine to replace the nicotine in cigarettes. Medicines may:  Help you to stop having the desire to smoke (cravings).  Help to stop the problems that come when you stop smoking (withdrawal symptoms). Your doctor may ask you to use:  Nicotine patches, gum, or lozenges.  Nicotine inhalers or sprays.  Non-nicotine medicine that is taken by mouth. Find resources Find resources and other ways to help you quit smoking and remain smoke-free after you quit. These resources are most helpful when you use them often. They include:  Online chats with a counselor.  Phone quitlines.  Printed self-help materials.  Support groups or group counseling.  Text messaging programs.  Mobile phone apps. Use apps on your mobile phone or tablet that can help you stick to your quit plan. There are many free apps for mobile phones and tablets as well as websites. Examples include Quit Guide from the CDC and smokefree.gov  What things can I do to make it easier to quit?   Talk to your family and friends. Ask them to support and encourage you.  Call a phone quitline (1-800-QUIT-NOW), reach out to support groups, or work with a counselor.  Ask people who smoke to not smoke around you.  Avoid places that make you want to smoke,   such as: ? Bars. ? Parties. ? Smoke-break areas at work.  Spend time with people who do not smoke.  Lower the stress in your life. Stress can make you want to smoke. Try these things to help your stress: ? Getting regular exercise. ? Doing deep-breathing exercises. ? Doing yoga. ? Meditating. ? Doing a body scan. To do this, close your eyes, focus on one area of your body at a time from head to toe. Notice which parts of your body are tense. Try to relax the muscles in those areas. How will I feel when I quit smoking? Day 1 to 3 weeks Within the first 24 hours,  you may start to have some problems that come from quitting tobacco. These problems are very bad 2-3 days after you quit, but they do not often last for more than 2-3 weeks. You may get these symptoms:  Mood swings.  Feeling restless, nervous, angry, or annoyed.  Trouble concentrating.  Dizziness.  Strong desire for high-sugar foods and nicotine.  Weight gain.  Trouble pooping (constipation).  Feeling like you may vomit (nausea).  Coughing or a sore throat.  Changes in how the medicines that you take for other issues work in your body.  Depression.  Trouble sleeping (insomnia). Week 3 and afterward After the first 2-3 weeks of quitting, you may start to notice more positive results, such as:  Better sense of smell and taste.  Less coughing and sore throat.  Slower heart rate.  Lower blood pressure.  Clearer skin.  Better breathing.  Fewer sick days. Quitting smoking can be hard. Do not give up if you fail the first time. Some people need to try a few times before they succeed. Do your best to stick to your quit plan, and talk with your doctor if you have any questions or concerns. Summary  Smoking tobacco is the leading cause of preventable death. Quitting smoking can be hard, but it is one of the best things that you can do for your health.  When you decide to quit smoking, make a plan to help you succeed.  Quit smoking right away, not slowly over a period of time.  When you start quitting, seek help from your doctor, family, or friends. This information is not intended to replace advice given to you by your health care provider. Make sure you discuss any questions you have with your health care provider. Document Revised: 01/08/2019 Document Reviewed: 07/04/2018 Elsevier Patient Education  2020 Elsevier Inc.  

## 2019-12-14 ENCOUNTER — Telehealth: Payer: Self-pay | Admitting: Internal Medicine

## 2019-12-14 ENCOUNTER — Other Ambulatory Visit (HOSPITAL_COMMUNITY): Payer: Self-pay | Admitting: Physician Assistant

## 2019-12-14 NOTE — Telephone Encounter (Signed)
Scheduled per los. Called and spoke with patient. Confirmed appt 

## 2019-12-21 DIAGNOSIS — J449 Chronic obstructive pulmonary disease, unspecified: Secondary | ICD-10-CM | POA: Diagnosis not present

## 2019-12-21 DIAGNOSIS — E785 Hyperlipidemia, unspecified: Secondary | ICD-10-CM | POA: Diagnosis not present

## 2019-12-21 DIAGNOSIS — I10 Essential (primary) hypertension: Secondary | ICD-10-CM | POA: Diagnosis not present

## 2019-12-21 DIAGNOSIS — C3491 Malignant neoplasm of unspecified part of right bronchus or lung: Secondary | ICD-10-CM | POA: Diagnosis not present

## 2019-12-21 DIAGNOSIS — E7800 Pure hypercholesterolemia, unspecified: Secondary | ICD-10-CM | POA: Diagnosis not present

## 2019-12-21 DIAGNOSIS — M1990 Unspecified osteoarthritis, unspecified site: Secondary | ICD-10-CM | POA: Diagnosis not present

## 2019-12-24 ENCOUNTER — Other Ambulatory Visit: Payer: Self-pay | Admitting: Cardiology

## 2019-12-24 NOTE — Telephone Encounter (Signed)
Prescription refill request for Eliquis received.  Last office visit: [place], 10/13/2019 Scr: 0.84, 12/13/2019 Age: 69 y.o. Weight: 85.8 kg   Prescription refill sent.

## 2019-12-28 ENCOUNTER — Inpatient Hospital Stay: Payer: Medicare HMO

## 2019-12-28 ENCOUNTER — Inpatient Hospital Stay: Payer: Medicare HMO | Admitting: Internal Medicine

## 2020-01-10 ENCOUNTER — Encounter: Payer: Self-pay | Admitting: Internal Medicine

## 2020-01-10 ENCOUNTER — Inpatient Hospital Stay: Payer: Medicare HMO | Attending: Internal Medicine | Admitting: Internal Medicine

## 2020-01-10 ENCOUNTER — Inpatient Hospital Stay: Payer: Medicare HMO

## 2020-01-10 ENCOUNTER — Other Ambulatory Visit: Payer: Self-pay

## 2020-01-10 VITALS — BP 129/97 | HR 60 | Temp 97.8°F | Resp 18 | Wt 188.7 lb

## 2020-01-10 DIAGNOSIS — Z79899 Other long term (current) drug therapy: Secondary | ICD-10-CM | POA: Diagnosis not present

## 2020-01-10 DIAGNOSIS — I10 Essential (primary) hypertension: Secondary | ICD-10-CM | POA: Diagnosis not present

## 2020-01-10 DIAGNOSIS — C342 Malignant neoplasm of middle lobe, bronchus or lung: Secondary | ICD-10-CM | POA: Diagnosis present

## 2020-01-10 DIAGNOSIS — C3491 Malignant neoplasm of unspecified part of right bronchus or lung: Secondary | ICD-10-CM

## 2020-01-10 DIAGNOSIS — C3431 Malignant neoplasm of lower lobe, right bronchus or lung: Secondary | ICD-10-CM | POA: Diagnosis not present

## 2020-01-10 DIAGNOSIS — Z5112 Encounter for antineoplastic immunotherapy: Secondary | ICD-10-CM | POA: Diagnosis not present

## 2020-01-10 LAB — CMP (CANCER CENTER ONLY)
ALT: 30 U/L (ref 0–44)
AST: 32 U/L (ref 15–41)
Albumin: 3.8 g/dL (ref 3.5–5.0)
Alkaline Phosphatase: 83 U/L (ref 38–126)
Anion gap: 10 (ref 5–15)
BUN: 13 mg/dL (ref 8–23)
CO2: 21 mmol/L — ABNORMAL LOW (ref 22–32)
Calcium: 9.5 mg/dL (ref 8.9–10.3)
Chloride: 102 mmol/L (ref 98–111)
Creatinine: 0.86 mg/dL (ref 0.61–1.24)
GFR, Est AFR Am: >60 mL/min (ref >60)
GFR, Estimated: >60 mL/min (ref >60)
Glucose, Bld: 88 mg/dL (ref 70–99)
Potassium: 4.8 mmol/L (ref 3.5–5.1)
Sodium: 133 mmol/L — ABNORMAL LOW (ref 135–145)
Total Bilirubin: 0.8 mg/dL (ref 0.3–1.2)
Total Protein: 7.1 g/dL (ref 6.5–8.1)

## 2020-01-10 LAB — CBC WITH DIFFERENTIAL (CANCER CENTER ONLY)
Abs Immature Granulocytes: 0.03 10*3/uL (ref 0.00–0.07)
Basophils Absolute: 0.1 10*3/uL (ref 0.0–0.1)
Basophils Relative: 2 %
Eosinophils Absolute: 0.1 10*3/uL (ref 0.0–0.5)
Eosinophils Relative: 2 %
HCT: 39.6 % (ref 39.0–52.0)
Hemoglobin: 14.4 g/dL (ref 13.0–17.0)
Immature Granulocytes: 1 %
Lymphocytes Relative: 27 %
Lymphs Abs: 1.3 10*3/uL (ref 0.7–4.0)
MCH: 40.7 pg — ABNORMAL HIGH (ref 26.0–34.0)
MCHC: 36.4 g/dL — ABNORMAL HIGH (ref 30.0–36.0)
MCV: 111.9 fL — ABNORMAL HIGH (ref 80.0–100.0)
Monocytes Absolute: 0.5 10*3/uL (ref 0.1–1.0)
Monocytes Relative: 10 %
Neutro Abs: 2.7 10*3/uL (ref 1.7–7.7)
Neutrophils Relative %: 58 %
Platelet Count: 180 10*3/uL (ref 150–400)
RBC: 3.54 MIL/uL — ABNORMAL LOW (ref 4.22–5.81)
RDW: 13.5 % (ref 11.5–15.5)
WBC Count: 4.7 10*3/uL (ref 4.0–10.5)
nRBC: 0 % (ref 0.0–0.2)

## 2020-01-10 LAB — TSH: TSH: 24.573 u[IU]/mL — ABNORMAL HIGH (ref 0.320–4.118)

## 2020-01-10 MED ORDER — SODIUM CHLORIDE 0.9 % IV SOLN
Freq: Once | INTRAVENOUS | Status: AC
  Filled 2020-01-10: qty 250

## 2020-01-10 MED ORDER — SODIUM CHLORIDE 0.9 % IV SOLN
10.0000 mg/kg | Freq: Once | INTRAVENOUS | Status: AC
  Administered 2020-01-10: 860 mg via INTRAVENOUS
  Filled 2020-01-10: qty 10

## 2020-01-10 NOTE — Patient Instructions (Signed)
[facility] Discharge Instructions for Patients Receiving Chemotherapy  Today you received the following chemotherapy agents Durvalumab (IMFINZI).  To help prevent nausea and vomiting after your treatment, we encourage you to take your nausea medication as prescribed.   If you develop nausea and vomiting that is not controlled by your nausea medication, call the clinic.   BELOW ARE SYMPTOMS THAT SHOULD BE REPORTED IMMEDIATELY:  *FEVER GREATER THAN 100.5 F  *CHILLS WITH OR WITHOUT FEVER  NAUSEA AND VOMITING THAT IS NOT CONTROLLED WITH YOUR NAUSEA MEDICATION  *UNUSUAL SHORTNESS OF BREATH  *UNUSUAL BRUISING OR BLEEDING  TENDERNESS IN MOUTH AND THROAT WITH OR WITHOUT PRESENCE OF ULCERS  *URINARY PROBLEMS  *BOWEL PROBLEMS  UNUSUAL RASH Items with * indicate a potential emergency and should be followed up as soon as possible.  Feel free to call the clinic should you have any questions or concerns. The clinic phone number is [phone].  Please show the [place] at check-in to the Emergency Department and triage nurse.

## 2020-01-10 NOTE — Progress Notes (Signed)
[facility] [phone]   [phone]  OFFICE PROGRESS NOTE  [name], MD [place] [address]  DIAGNOSIS: Stage IIIA (T3, N1, M0) non-small cell lung cancer, squamous cell carcinoma diagnosed in July 2020 and presented with right middle lobe mass and right hilar adenopathy with postobstructive pneumonia and suspicious right pleural effusion.  PRIOR THERAPY:  Weekly concurrent chemoradiation with Carboplatin for an AUC of 2 and paclitaxel 45 mg/m2. First dose 11/23/2018. Status post 7 cycles.   CURRENT THERAPY: Consolidation immunotherapy with Imfinzi 10 mg/KG every 2 weeks.  First dose February 08, 2019.  Status post 22 cycles.  INTERVAL HISTORY: Joe Cole 69 y.o. male returns to the clinic today for follow-up visit.  The patient is feeling fine today with no concerning complaints except for the baseline shortness of breath.  He missed the last cycle of his treatment because of family emergency.  He denied having any current chest pain, cough or hemoptysis.  He denied having any fever or chills.  He has no nausea, vomiting, diarrhea or constipation.  He is here today for evaluation before starting cycle #23 of his treatment.   MEDICAL HISTORY: Past Medical History:  Diagnosis Date  . Arthritis   . Atrial fibrillation ([place]) 10/2018  . COPD with emphysema ([place])   . Dyspnea   . Hypertension   . lung ca dx'd 10/2018    ALLERGIES:  has No Known Allergies.  MEDICATIONS:  Current Outpatient Medications  Medication Sig Dispense Refill  . acetaminophen (TYLENOL) 500 MG tablet Take 1,000 mg by mouth every 6 (six) hours as needed for moderate pain or headache.    . albuterol (PROAIR HFA) 108 (90 Base) MCG/ACT inhaler Inhale 2 puffs into the lungs every 6 (six) hours as needed for wheezing or shortness of breath. 1 Inhaler 5  . atorvastatin (LIPITOR) 20 MG tablet Take 20 mg by mouth daily.    Marland Kitchen diltiazem  (CARDIZEM CD) 360 MG 24 hr capsule Take 1 capsule (360 mg total) by mouth daily. 90 capsule 2  . ELIQUIS 5 MG TABS tablet TAKE ONE TABLET BY MOUTH TWICE DAILY 60 tablet 5  . levothyroxine (SYNTHROID) 75 MCG tablet Take 1 tablet (75 mcg total) by mouth daily before breakfast. 30 tablet 1  . metoprolol succinate (TOPROL XL) 50 MG 24 hr tablet Take 1 tablet (50 mg total) by mouth at bedtime. Take with or immediately following a meal. 30 tablet 11  . metoprolol succinate (TOPROL-XL) 25 MG 24 hr tablet TAKE 1 TABLET BY MOUTH EVERYDAY AT BEDTIME 90 tablet 3  . Omega-3 Fatty Acids (FISH OIL) 1200 MG CAPS Take by mouth.    . prochlorperazine (COMPAZINE) 10 MG tablet Take 1 tablet (10 mg total) by mouth every 6 (six) hours as needed for nausea or vomiting. 30 tablet 0  . traZODone (DESYREL) 50 MG tablet TAKE 1 TABLET BY MOUTH EVERYDAY AT BEDTIME 90 tablet 1  . umeclidinium-vilanterol (ANORO ELLIPTA) 62.5-25 MCG/INH AEPB Inhale 1 puff into the lungs daily. (Patient not taking: Reported on 11/09/2019) 1 each 6   No current facility-administered medications for this visit.    SURGICAL HISTORY:  Past Surgical History:  Procedure Laterality Date  . CARDIOVERSION N/A 01/11/2019   Procedure: CARDIOVERSION;  Surgeon: Josue Hector, MD;  Location: [facility] ENDOSCOPY;  Service: Cardiovascular;  Laterality: N/A;  . KNEE ARTHROSCOPY     BIL   . TOE SURGERY  PINNED LEFT BIG TOE  . TONSILLECTOMY     T+A  AS CHILD  . TOTAL KNEE ARTHROPLASTY Left 08/19/2017   Procedure: TOTAL KNEE ARTHROPLASTY;  Surgeon: Renette Butters, MD;  Location: [facility];  Service: Orthopedics;  Laterality: Left;  Marland Kitchen VIDEO BRONCHOSCOPY Bilateral 11/05/2018   Procedure: VIDEO BRONCHOSCOPY WITH FLUORO;  Surgeon: [name], MD;  Location: [facility] ENDOSCOPY;  Service: Cardiopulmonary;  Laterality: Bilateral;  . VIDEO BRONCHOSCOPY Bilateral 11/09/2018   Procedure: VIDEO BRONCHOSCOPY WITH FLUORO;  Surgeon: Rigoberto Noel, MD;  Location: [facility];   Service: Cardiopulmonary;  Laterality: Bilateral;    REVIEW OF SYSTEMS:  A comprehensive review of systems was negative except for: Respiratory: positive for dyspnea on exertion   PHYSICAL EXAMINATION: General appearance: alert, cooperative and no distress Head: Normocephalic, without obvious abnormality, atraumatic Neck: no adenopathy, no JVD, supple, symmetrical, trachea midline and thyroid not enlarged, symmetric, no tenderness/mass/nodules Lymph nodes: Cervical, supraclavicular, and axillary nodes normal. Resp: clear to auscultation bilaterally Back: symmetric, no curvature. ROM normal. No CVA tenderness. Cardio: regular rate and rhythm, S1, S2 normal, no murmur, click, rub or gallop GI: soft, non-tender; bowel sounds normal; no masses,  no organomegaly Extremities: extremities normal, atraumatic, no cyanosis or edema  ECOG PERFORMANCE STATUS: 1 - Symptomatic but completely ambulatory  Blood pressure (!) 129/97, pulse 60, temperature 97.8 F (36.6 C), temperature source Tympanic, resp. rate 18, weight 188 lb 11.2 oz (85.6 kg), SpO2 100 %.  LABORATORY DATA: Lab Results  Component Value Date   WBC 4.7 01/10/2020   HGB 14.4 01/10/2020   HCT 39.6 01/10/2020   MCV 111.9 (H) 01/10/2020   PLT 180 01/10/2020      Chemistry      Component Value Date/Time   NA 134 (L) 12/13/2019 0800   K 4.6 12/13/2019 0800   CL 101 12/13/2019 0800   CO2 20 (L) 12/13/2019 0800   BUN 9 12/13/2019 0800   CREATININE 0.84 12/13/2019 0800      Component Value Date/Time   CALCIUM 9.8 12/13/2019 0800   ALKPHOS 85 12/13/2019 0800   AST 58 (H) 12/13/2019 0800   ALT 45 (H) 12/13/2019 0800   BILITOT 0.9 12/13/2019 0800       RADIOGRAPHIC STUDIES: No results found.  ASSESSMENT AND PLAN: This is a very pleasant 69 years old white male with stage IIIA non-small cell lung cancer, squamous cell carcinoma.  The patient underwent a course of concurrent chemoradiation with weekly carboplatin and  paclitaxel status post 7 cycles.  The patient tolerated his treatment well except for mild fatigue and odynophagia. The patient is currently undergoing consolidation treatment with immunotherapy with Imfinzi every 2 weeks status post 22 cycles.   The patient continues to tolerate his treatment well with no concerning adverse effects. I recommended for him to proceed with cycle #23 today as planned. For the hypothyroidism, he is currently on levothyroxine 75 mcg p.o. daily.  We will continue to monitor his TSH closely. For the history of atrial fibrillation, he is currently on treatment with Eliquis. I will see him back for follow-up visit in 2 weeks for evaluation before starting cycle #24. He was advised to call immediately if he has any concerning symptoms in the interval. The patient voices understanding of current disease status and treatment options and is in agreement with the current care plan. All questions were answered. The patient knows to call the clinic with any problems, questions or concerns. We can certainly see the patient much sooner  if necessary.  Disclaimer: This note was dictated with voice recognition software. Similar sounding words can inadvertently be transcribed and may not be corrected upon review.

## 2020-01-24 ENCOUNTER — Inpatient Hospital Stay: Payer: Medicare HMO

## 2020-01-24 ENCOUNTER — Other Ambulatory Visit: Payer: Self-pay

## 2020-01-24 ENCOUNTER — Encounter: Payer: Self-pay | Admitting: Internal Medicine

## 2020-01-24 ENCOUNTER — Inpatient Hospital Stay (HOSPITAL_BASED_OUTPATIENT_CLINIC_OR_DEPARTMENT_OTHER): Payer: Medicare HMO | Admitting: Internal Medicine

## 2020-01-24 VITALS — BP 137/71 | HR 58 | Temp 98.2°F | Resp 18

## 2020-01-24 VITALS — BP 133/82 | HR 68 | Temp 96.8°F | Resp 16 | Ht 73.0 in | Wt 192.9 lb

## 2020-01-24 DIAGNOSIS — C3431 Malignant neoplasm of lower lobe, right bronchus or lung: Secondary | ICD-10-CM

## 2020-01-24 DIAGNOSIS — M1990 Unspecified osteoarthritis, unspecified site: Secondary | ICD-10-CM | POA: Diagnosis not present

## 2020-01-24 DIAGNOSIS — C3491 Malignant neoplasm of unspecified part of right bronchus or lung: Secondary | ICD-10-CM

## 2020-01-24 DIAGNOSIS — E785 Hyperlipidemia, unspecified: Secondary | ICD-10-CM | POA: Diagnosis not present

## 2020-01-24 DIAGNOSIS — Z5112 Encounter for antineoplastic immunotherapy: Secondary | ICD-10-CM

## 2020-01-24 DIAGNOSIS — E7800 Pure hypercholesterolemia, unspecified: Secondary | ICD-10-CM | POA: Diagnosis not present

## 2020-01-24 DIAGNOSIS — J449 Chronic obstructive pulmonary disease, unspecified: Secondary | ICD-10-CM | POA: Diagnosis not present

## 2020-01-24 DIAGNOSIS — I10 Essential (primary) hypertension: Secondary | ICD-10-CM | POA: Diagnosis not present

## 2020-01-24 LAB — CBC WITH DIFFERENTIAL (CANCER CENTER ONLY)
Abs Immature Granulocytes: 0.04 10*3/uL (ref 0.00–0.07)
Basophils Absolute: 0.1 10*3/uL (ref 0.0–0.1)
Basophils Relative: 1 %
Eosinophils Absolute: 0.1 10*3/uL (ref 0.0–0.5)
Eosinophils Relative: 1 %
HCT: 40 % (ref 39.0–52.0)
Hemoglobin: 14.5 g/dL (ref 13.0–17.0)
Immature Granulocytes: 1 %
Lymphocytes Relative: 23 %
Lymphs Abs: 1.3 10*3/uL (ref 0.7–4.0)
MCH: 41.2 pg — ABNORMAL HIGH (ref 26.0–34.0)
MCHC: 36.3 g/dL — ABNORMAL HIGH (ref 30.0–36.0)
MCV: 113.6 fL — ABNORMAL HIGH (ref 80.0–100.0)
Monocytes Absolute: 0.6 10*3/uL (ref 0.1–1.0)
Monocytes Relative: 11 %
Neutro Abs: 3.5 10*3/uL (ref 1.7–7.7)
Neutrophils Relative %: 63 %
Platelet Count: 183 10*3/uL (ref 150–400)
RBC: 3.52 MIL/uL — ABNORMAL LOW (ref 4.22–5.81)
RDW: 13.7 % (ref 11.5–15.5)
WBC Count: 5.6 10*3/uL (ref 4.0–10.5)
nRBC: 0 % (ref 0.0–0.2)

## 2020-01-24 LAB — CMP (CANCER CENTER ONLY)
ALT: 33 U/L (ref 0–44)
AST: 36 U/L (ref 15–41)
Albumin: 3.7 g/dL (ref 3.5–5.0)
Alkaline Phosphatase: 75 U/L (ref 38–126)
Anion gap: 10 (ref 5–15)
BUN: 12 mg/dL (ref 8–23)
CO2: 22 mmol/L (ref 22–32)
Calcium: 9.4 mg/dL (ref 8.9–10.3)
Chloride: 101 mmol/L (ref 98–111)
Creatinine: 0.85 mg/dL (ref 0.61–1.24)
GFR, Est AFR Am: >60 mL/min (ref >60)
GFR, Estimated: >60 mL/min (ref >60)
Glucose, Bld: 85 mg/dL (ref 70–99)
Potassium: 4.7 mmol/L (ref 3.5–5.1)
Sodium: 133 mmol/L — ABNORMAL LOW (ref 135–145)
Total Bilirubin: 0.8 mg/dL (ref 0.3–1.2)
Total Protein: 7.1 g/dL (ref 6.5–8.1)

## 2020-01-24 MED ORDER — SODIUM CHLORIDE 0.9 % IV SOLN
Freq: Once | INTRAVENOUS | Status: AC
  Filled 2020-01-24: qty 250

## 2020-01-24 MED ORDER — SODIUM CHLORIDE 0.9 % IV SOLN
10.0000 mg/kg | Freq: Once | INTRAVENOUS | Status: AC
  Administered 2020-01-24: 860 mg via INTRAVENOUS
  Filled 2020-01-24: qty 10

## 2020-01-24 NOTE — Patient Instructions (Signed)
[facility] Discharge Instructions for Patients Receiving Chemotherapy  Today you received the following chemotherapy agents: DURVALUMAB.  To help prevent nausea and vomiting after your treatment, we encourage you to take your nausea medication as directed.   If you develop nausea and vomiting that is not controlled by your nausea medication, call the clinic.   BELOW ARE SYMPTOMS THAT SHOULD BE REPORTED IMMEDIATELY:  *FEVER GREATER THAN 100.5 F  *CHILLS WITH OR WITHOUT FEVER  NAUSEA AND VOMITING THAT IS NOT CONTROLLED WITH YOUR NAUSEA MEDICATION  *UNUSUAL SHORTNESS OF BREATH  *UNUSUAL BRUISING OR BLEEDING  TENDERNESS IN MOUTH AND THROAT WITH OR WITHOUT PRESENCE OF ULCERS  *URINARY PROBLEMS  *BOWEL PROBLEMS  UNUSUAL RASH Items with * indicate a potential emergency and should be followed up as soon as possible.  Feel free to call the clinic should you have any questions or concerns. The clinic phone number is [phone].  Please show the [facility] at check-in to the Emergency Department and triage nurse.

## 2020-01-24 NOTE — Progress Notes (Signed)
[facility] [phone]   [phone]) [phone]  OFFICE PROGRESS NOTE  [name], MD [place] [address]  DIAGNOSIS: Stage IIIA (T3, N1, M0) non-small cell lung cancer, squamous cell carcinoma diagnosed in July 2020 and presented with right middle lobe mass and right hilar adenopathy with postobstructive pneumonia and suspicious right pleural effusion.  PRIOR THERAPY:  Weekly concurrent chemoradiation with Carboplatin for an AUC of 2 and paclitaxel 45 mg/m2. First dose 11/23/2018. Status post 7 cycles.   CURRENT THERAPY: Consolidation immunotherapy with Imfinzi 10 mg/KG every 2 weeks.  First dose February 08, 2019.  Status post 23 cycles.  INTERVAL HISTORY: Joe Cole 69 y.o. male returns to the clinic today for follow-up visit.  The patient is feeling fine today with no concerning complaints.  He denied having any chest pain, but has shortness of breath with exertion and mild cough with no hemoptysis.  He denied having any recent weight loss or night sweats.  He has no nausea, vomiting, diarrhea or constipation.  He denied having any headache or visual changes.  The patient continues to tolerate his treatment with Imfinzi fairly well.  He is here today for evaluation before starting cycle #24.   MEDICAL HISTORY: Past Medical History:  Diagnosis Date  . Arthritis   . Atrial fibrillation ([place]) 10/2018  . COPD with emphysema ([place])   . Dyspnea   . Hypertension   . lung ca dx'd 10/2018    ALLERGIES:  has No Known Allergies.  MEDICATIONS:  Current Outpatient Medications  Medication Sig Dispense Refill  . acetaminophen (TYLENOL) 500 MG tablet Take 1,000 mg by mouth every 6 (six) hours as needed for moderate pain or headache.    . albuterol (PROAIR HFA) 108 (90 Base) MCG/ACT inhaler Inhale 2 puffs into the lungs every 6 (six) hours as needed for wheezing or shortness of breath. 1 Inhaler 5  . atorvastatin (LIPITOR) 20  MG tablet Take 20 mg by mouth daily.    Marland Kitchen diltiazem (CARDIZEM CD) 360 MG 24 hr capsule Take 1 capsule (360 mg total) by mouth daily. 90 capsule 2  . ELIQUIS 5 MG TABS tablet TAKE ONE TABLET BY MOUTH TWICE DAILY 60 tablet 5  . levothyroxine (SYNTHROID) 75 MCG tablet Take 1 tablet (75 mcg total) by mouth daily before breakfast. 30 tablet 1  . metoprolol succinate (TOPROL XL) 50 MG 24 hr tablet Take 1 tablet (50 mg total) by mouth at bedtime. Take with or immediately following a meal. 30 tablet 11  . metoprolol succinate (TOPROL-XL) 25 MG 24 hr tablet TAKE 1 TABLET BY MOUTH EVERYDAY AT BEDTIME 90 tablet 3  . Omega-3 Fatty Acids (FISH OIL) 1200 MG CAPS Take by mouth.    . prochlorperazine (COMPAZINE) 10 MG tablet Take 1 tablet (10 mg total) by mouth every 6 (six) hours as needed for nausea or vomiting. 30 tablet 0  . traZODone (DESYREL) 50 MG tablet TAKE 1 TABLET BY MOUTH EVERYDAY AT BEDTIME 90 tablet 1   No current facility-administered medications for this visit.    SURGICAL HISTORY:  Past Surgical History:  Procedure Laterality Date  . CARDIOVERSION N/A 01/11/2019   Procedure: CARDIOVERSION;  Surgeon: Josue Hector, MD;  Location: [facility] ENDOSCOPY;  Service: Cardiovascular;  Laterality: N/A;  . KNEE ARTHROSCOPY     BIL   . TOE SURGERY     PINNED LEFT BIG TOE  . TONSILLECTOMY     T+A  AS CHILD  . TOTAL KNEE ARTHROPLASTY Left 08/19/2017   Procedure: TOTAL KNEE ARTHROPLASTY;  Surgeon: Renette Butters, MD;  Location: [facility];  Service: Orthopedics;  Laterality: Left;  Marland Kitchen VIDEO BRONCHOSCOPY Bilateral 11/05/2018   Procedure: VIDEO BRONCHOSCOPY WITH FLUORO;  Surgeon: [name], MD;  Location: [facility] ENDOSCOPY;  Service: Cardiopulmonary;  Laterality: Bilateral;  . VIDEO BRONCHOSCOPY Bilateral 11/09/2018   Procedure: VIDEO BRONCHOSCOPY WITH FLUORO;  Surgeon: Rigoberto Noel, MD;  Location: [facility];  Service: Cardiopulmonary;  Laterality: Bilateral;    REVIEW OF SYSTEMS:  A comprehensive review of  systems was negative except for: Respiratory: positive for cough and dyspnea on exertion   PHYSICAL EXAMINATION: General appearance: alert, cooperative and no distress Head: Normocephalic, without obvious abnormality, atraumatic Neck: no adenopathy, no JVD, supple, symmetrical, trachea midline and thyroid not enlarged, symmetric, no tenderness/mass/nodules Lymph nodes: Cervical, supraclavicular, and axillary nodes normal. Resp: clear to auscultation bilaterally Back: symmetric, no curvature. ROM normal. No CVA tenderness. Cardio: regular rate and rhythm, S1, S2 normal, no murmur, click, rub or gallop GI: soft, non-tender; bowel sounds normal; no masses,  no organomegaly Extremities: extremities normal, atraumatic, no cyanosis or edema  ECOG PERFORMANCE STATUS: 1 - Symptomatic but completely ambulatory  Blood pressure 133/82, pulse 68, temperature (!) 96.8 F (36 C), temperature source Tympanic, resp. rate 16, height 6\' 1"  (1.854 m), weight 192 lb 14.4 oz (87.5 kg), SpO2 100 %.  LABORATORY DATA: Lab Results  Component Value Date   WBC 5.6 01/24/2020   HGB 14.5 01/24/2020   HCT 40.0 01/24/2020   MCV 113.6 (H) 01/24/2020   PLT 183 01/24/2020      Chemistry      Component Value Date/Time   NA 133 (L) 01/10/2020 0759   K 4.8 01/10/2020 0759   CL 102 01/10/2020 0759   CO2 21 (L) 01/10/2020 0759   BUN 13 01/10/2020 0759   CREATININE 0.86 01/10/2020 0759      Component Value Date/Time   CALCIUM 9.5 01/10/2020 0759   ALKPHOS 83 01/10/2020 0759   AST 32 01/10/2020 0759   ALT 30 01/10/2020 0759   BILITOT 0.8 01/10/2020 0759       RADIOGRAPHIC STUDIES: No results found.  ASSESSMENT AND PLAN: This is a very pleasant 69 years old white male with stage IIIA non-small cell lung cancer, squamous cell carcinoma.  The patient underwent a course of concurrent chemoradiation with weekly carboplatin and paclitaxel status post 7 cycles.  The patient tolerated his treatment well except for  mild fatigue and odynophagia. The patient is currently undergoing consolidation treatment with immunotherapy with Imfinzi every 2 weeks status post 23 cycles.   The patient continues to tolerate his treatment well with no concerning adverse effects. I recommended for him to proceed with cycle #24 today as planned. The patient will come back for follow-up visit in 2 weeks for evaluation before the next cycle of his treatment. For the hypothyroidism, he is currently on levothyroxine 75 mcg p.o. daily.  We will continue to monitor his TSH closely. For the history of atrial fibrillation, he is currently on treatment with Eliquis. He was advised to call immediately if he has any concerning symptoms in the interval. The patient voices understanding of current disease status and treatment options and is in agreement with the current care plan. All questions were answered. The patient knows to call the clinic with any problems, questions or concerns. We can certainly see the patient much sooner if necessary.  Disclaimer: This  note was dictated with voice recognition software. Similar sounding words can inadvertently be transcribed and may not be corrected upon review.

## 2020-01-31 DIAGNOSIS — J449 Chronic obstructive pulmonary disease, unspecified: Secondary | ICD-10-CM | POA: Diagnosis not present

## 2020-01-31 DIAGNOSIS — E7800 Pure hypercholesterolemia, unspecified: Secondary | ICD-10-CM | POA: Diagnosis not present

## 2020-01-31 DIAGNOSIS — E785 Hyperlipidemia, unspecified: Secondary | ICD-10-CM | POA: Diagnosis not present

## 2020-01-31 DIAGNOSIS — C3491 Malignant neoplasm of unspecified part of right bronchus or lung: Secondary | ICD-10-CM | POA: Diagnosis not present

## 2020-01-31 DIAGNOSIS — I10 Essential (primary) hypertension: Secondary | ICD-10-CM | POA: Diagnosis not present

## 2020-01-31 DIAGNOSIS — M1990 Unspecified osteoarthritis, unspecified site: Secondary | ICD-10-CM | POA: Diagnosis not present

## 2020-02-04 ENCOUNTER — Other Ambulatory Visit: Payer: Self-pay

## 2020-02-04 ENCOUNTER — Telehealth: Payer: Self-pay | Admitting: Internal Medicine

## 2020-02-04 ENCOUNTER — Telehealth: Payer: Self-pay

## 2020-02-04 NOTE — Telephone Encounter (Signed)
Scheduled appts per sch msg. Called and left detailed message about why there is a gap between MD and infusion.

## 2020-02-04 NOTE — Telephone Encounter (Signed)
Pts wife, Rosetta Posner wanting to know when pts next treatment is as he is due 02/07/20.  Schedule message has been sent. I have also spoken with the pt and advised of this. Pt was advised to expect a call from the scheduling department with his day/time of his appt. Pt v/u of this information.

## 2020-02-05 IMAGING — CT CT CHEST W/ CM
2 of 4 series · 13 of 36 positions shown, 16 images · IV contrast (omnipaque)
Comparison: 10/20/2018 chest CT angiogram.  10/29/2018 PET-CT.

CLINICAL DATA: Stage IIIA squamous cell right lung cancer diagnosed
October 2018 with interval concurrent chemo radiation therapy.
Restaging.

EXAM:
CT CHEST WITH CONTRAST
TECHNIQUE: Multidetector CT imaging of the chest was performed during
intravenous contrast administration.
CONTRAST:  75mL OMNIPAQUE IOHEXOL 300 MG/ML  SOLN

[Series 2: axial st · axial · 0.82mm/px · z∈[-514,-230]mm · 10 of 172 slices shown, 13 images]
[im 15/172  mediastinal]
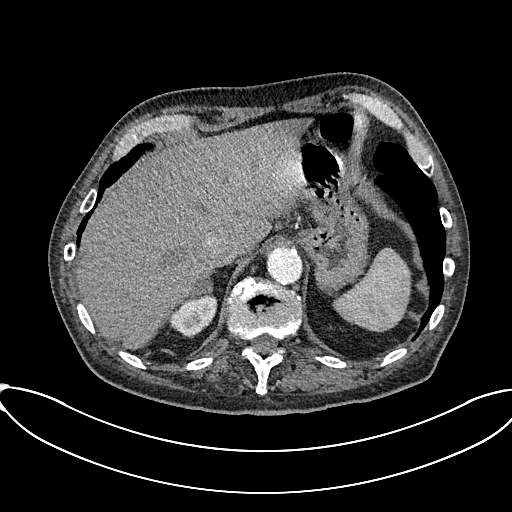
[im 15/172  lung]
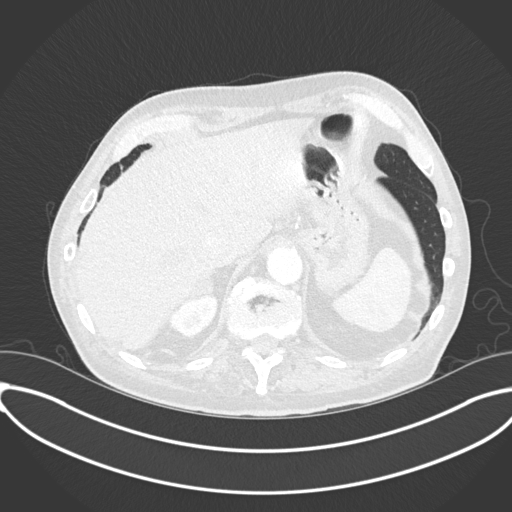
[im 30/172  lung]
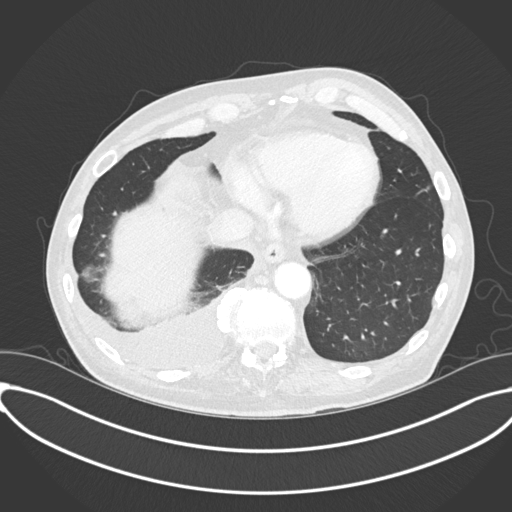
[im 45/172  lung]
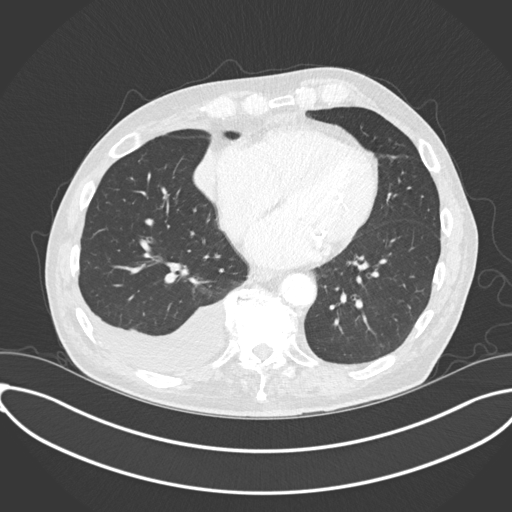
[im 60/172  lung]
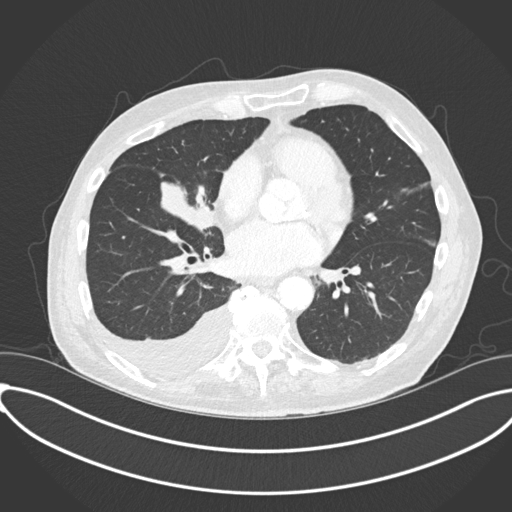
[im 75/172  mediastinal]
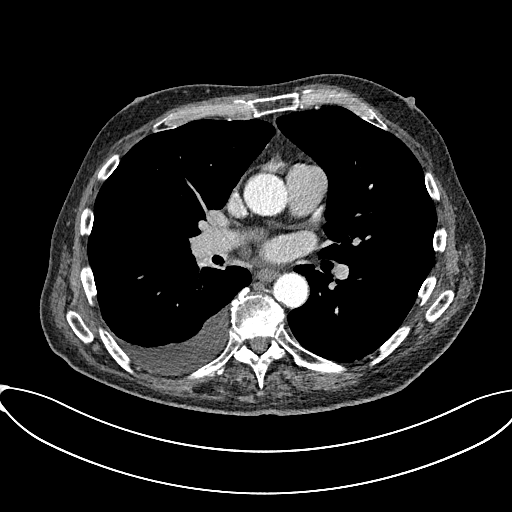
[im 75/172  lung]
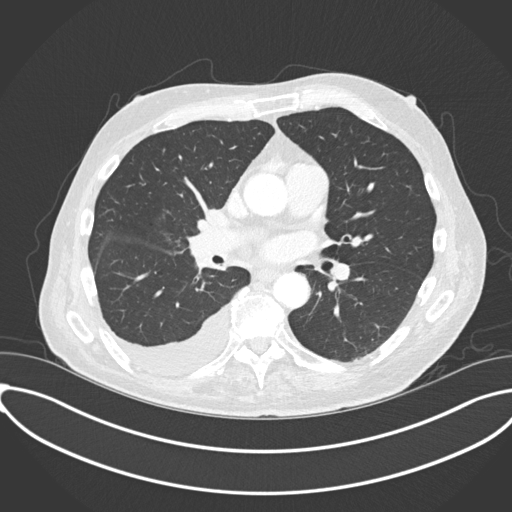
[im 97/172  lung]
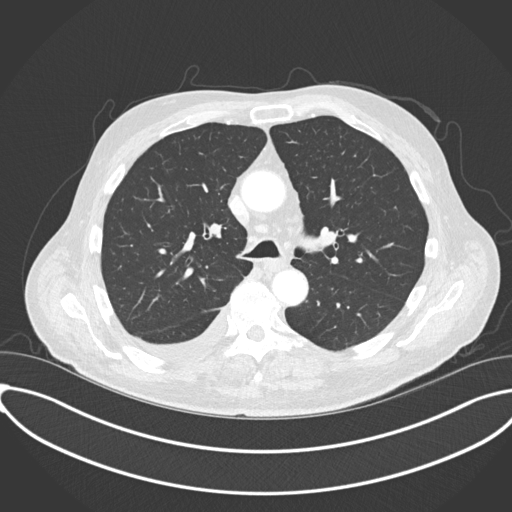
[im 112/172  lung]
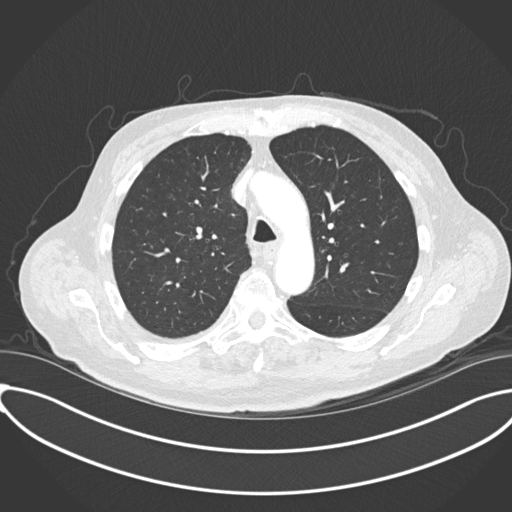
[im 127/172  lung]
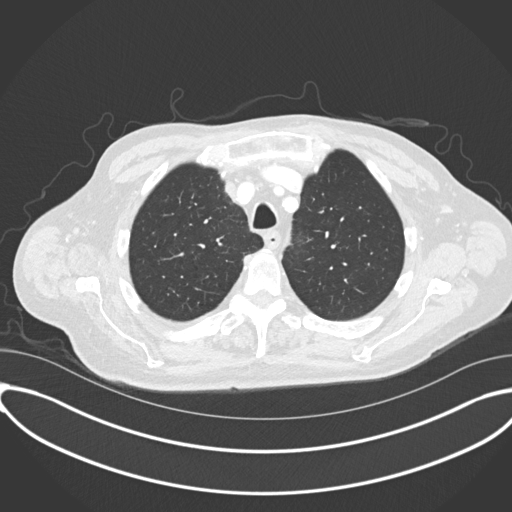
[im 142/172  mediastinal]
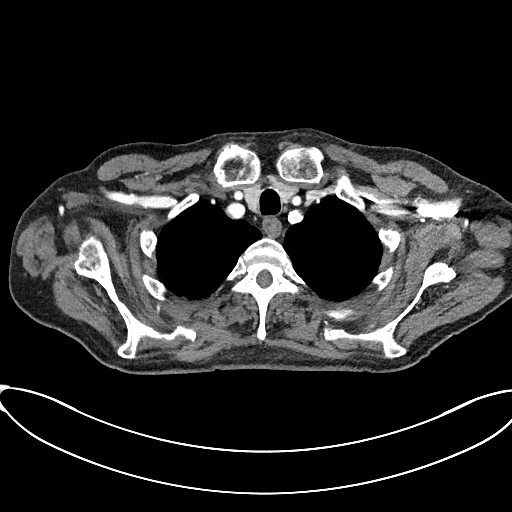
[im 142/172  lung]
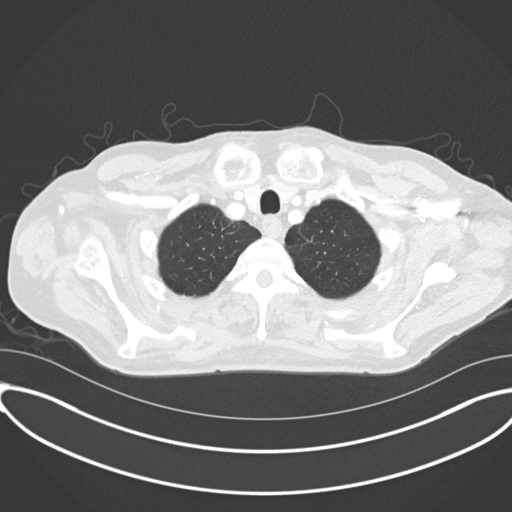
[im 157/172  lung]
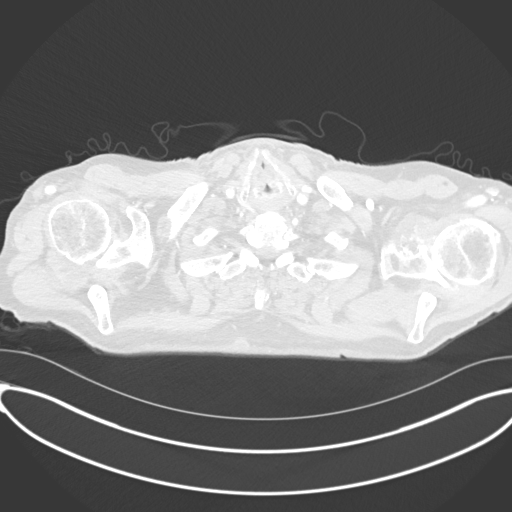

[Series 8: coronal · coronal · 0.69mm/px · 3 of 132 slices shown]
[im 27/132  lung]
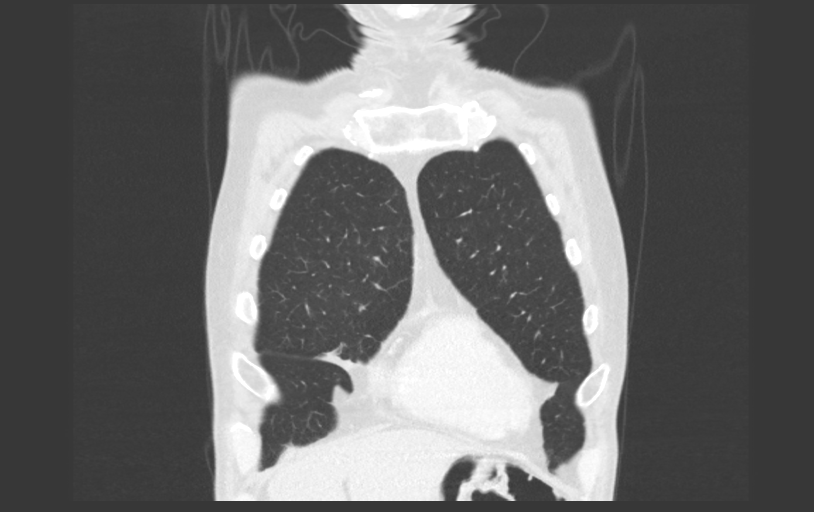
[im 53/132  lung]
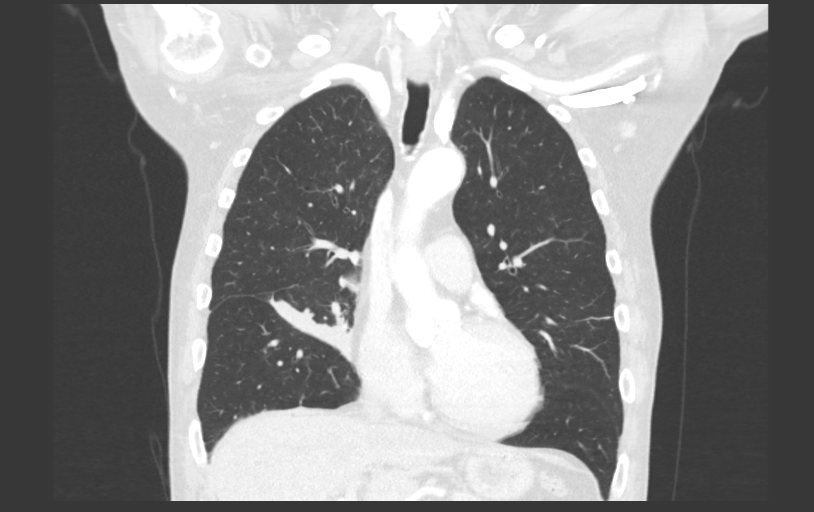
[im 79/132  lung]
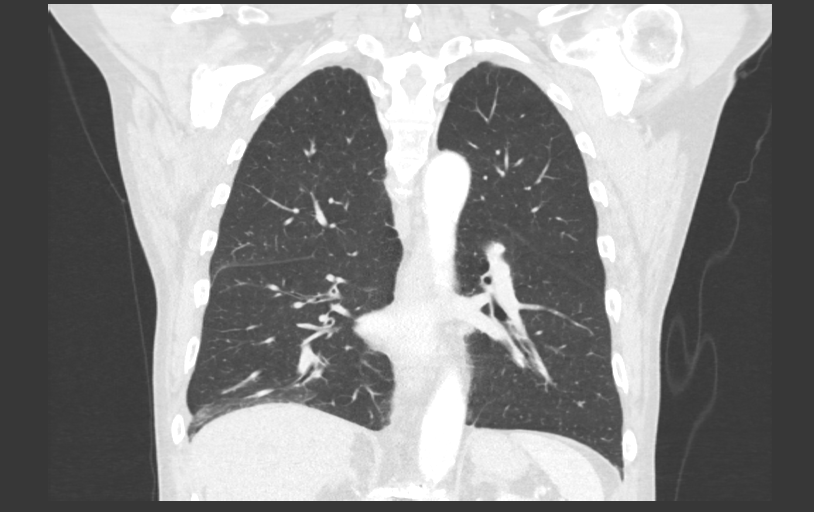

[13 of 36 positions shown; findings below may reference images not displayed]

FINDINGS: Cardiovascular: Normal heart size. No significant pericardial
effusion/thickening. Left anterior descending coronary
atherosclerosis. Atherosclerotic nonaneurysmal thoracic aorta.
Normal caliber pulmonary arteries. No central pulmonary emboli.

Mediastinum/Nodes: No discrete thyroid nodules. Unremarkable
esophagus. No axillary adenopathy. Previously visualized mildly
enlarged 1.1 cm right paratracheal node on 10/20/2018 chest CT
angiogram study is decreased to 0.7 cm (series 2/image 69). No
pathologically enlarged mediastinal nodes. No left hilar adenopathy.

Lungs/Pleura: No pneumothorax. Small dependent right pleural
effusion slightly increased. No left pleural effusion. Poorly
marginated 2.7 x 1.7 cm right infrahilar pulmonary nodule (series
2/image 104), decreased from 4.6 x 3.1 cm on 10/20/2018 chest CT
using similar measurement technique. Persistent but improved
narrowing of the right middle lobe bronchus. Right lower lobe
bronchus is now patent. Near complete consolidation of the right
middle lobe is similar, with worsened volume loss in the right
middle lobe. Residual thin parenchymal bands in the basilar right
lower lobe. No acute consolidative airspace disease or new
significant pulmonary nodules. Stable tiny calcified right upper
lobe granuloma. Mild centrilobular emphysema.

Upper abdomen: Stable mild thickening of both adrenal glands without
discrete adrenal nodules.

Musculoskeletal: No aggressive appearing focal osseous lesions.
Marked thoracic spondylosis.
IMPRESSION: 1. Interval response to therapy. Poorly marginated right infrahilar
mass is decreased in size. Persistent but improved narrowing of the
right middle lobe bronchus. Right lower lobe bronchus is now patent.
2. No new or progressive metastatic disease in the chest. Right
paratracheal adenopathy has decreased.
3. Small dependent right pleural effusion is slightly increased.
4. Persistent near complete consolidation of the right middle lobe
with worsening right middle lobe volume loss, compatible with
evolving postobstructive scarring/atelectasis.

Aortic Atherosclerosis (DU1ML-AA0.0) and Emphysema (DU1ML-DN9.4).

## 2020-02-07 ENCOUNTER — Inpatient Hospital Stay: Payer: Medicare HMO

## 2020-02-07 ENCOUNTER — Other Ambulatory Visit: Payer: Self-pay

## 2020-02-07 ENCOUNTER — Encounter: Payer: Self-pay | Admitting: Internal Medicine

## 2020-02-07 ENCOUNTER — Inpatient Hospital Stay: Payer: Medicare HMO | Attending: Internal Medicine | Admitting: Internal Medicine

## 2020-02-07 VITALS — BP 128/89 | HR 64 | Temp 97.6°F | Resp 18 | Ht 73.0 in | Wt 193.3 lb

## 2020-02-07 DIAGNOSIS — Z7901 Long term (current) use of anticoagulants: Secondary | ICD-10-CM | POA: Diagnosis not present

## 2020-02-07 DIAGNOSIS — Z5112 Encounter for antineoplastic immunotherapy: Secondary | ICD-10-CM | POA: Diagnosis not present

## 2020-02-07 DIAGNOSIS — C3431 Malignant neoplasm of lower lobe, right bronchus or lung: Secondary | ICD-10-CM | POA: Diagnosis not present

## 2020-02-07 DIAGNOSIS — C3491 Malignant neoplasm of unspecified part of right bronchus or lung: Secondary | ICD-10-CM

## 2020-02-07 DIAGNOSIS — Z23 Encounter for immunization: Secondary | ICD-10-CM | POA: Insufficient documentation

## 2020-02-07 DIAGNOSIS — E039 Hypothyroidism, unspecified: Secondary | ICD-10-CM | POA: Insufficient documentation

## 2020-02-07 DIAGNOSIS — Z79899 Other long term (current) drug therapy: Secondary | ICD-10-CM | POA: Diagnosis not present

## 2020-02-07 DIAGNOSIS — I4891 Unspecified atrial fibrillation: Secondary | ICD-10-CM | POA: Insufficient documentation

## 2020-02-07 DIAGNOSIS — I10 Essential (primary) hypertension: Secondary | ICD-10-CM

## 2020-02-07 DIAGNOSIS — C342 Malignant neoplasm of middle lobe, bronchus or lung: Secondary | ICD-10-CM | POA: Diagnosis present

## 2020-02-07 LAB — CBC WITH DIFFERENTIAL (CANCER CENTER ONLY)
Abs Immature Granulocytes: 0.03 10*3/uL (ref 0.00–0.07)
Basophils Absolute: 0.1 10*3/uL (ref 0.0–0.1)
Basophils Relative: 1 %
Eosinophils Absolute: 0.1 10*3/uL (ref 0.0–0.5)
Eosinophils Relative: 2 %
HCT: 38.2 % — ABNORMAL LOW (ref 39.0–52.0)
Hemoglobin: 13.7 g/dL (ref 13.0–17.0)
Immature Granulocytes: 1 %
Lymphocytes Relative: 20 %
Lymphs Abs: 1.1 10*3/uL (ref 0.7–4.0)
MCH: 41 pg — ABNORMAL HIGH (ref 26.0–34.0)
MCHC: 35.9 g/dL (ref 30.0–36.0)
MCV: 114.4 fL — ABNORMAL HIGH (ref 80.0–100.0)
Monocytes Absolute: 0.7 10*3/uL (ref 0.1–1.0)
Monocytes Relative: 12 %
Neutro Abs: 3.4 10*3/uL (ref 1.7–7.7)
Neutrophils Relative %: 64 %
Platelet Count: 182 10*3/uL (ref 150–400)
RBC: 3.34 MIL/uL — ABNORMAL LOW (ref 4.22–5.81)
RDW: 13.7 % (ref 11.5–15.5)
WBC Count: 5.3 10*3/uL (ref 4.0–10.5)
nRBC: 0 % (ref 0.0–0.2)

## 2020-02-07 LAB — TSH: TSH: 24.665 u[IU]/mL — ABNORMAL HIGH (ref 0.320–4.118)

## 2020-02-07 LAB — CMP (CANCER CENTER ONLY)
ALT: 34 U/L (ref 0–44)
AST: 32 U/L (ref 15–41)
Albumin: 3.7 g/dL (ref 3.5–5.0)
Alkaline Phosphatase: 79 U/L (ref 38–126)
Anion gap: 9 (ref 5–15)
BUN: 11 mg/dL (ref 8–23)
CO2: 22 mmol/L (ref 22–32)
Calcium: 9.4 mg/dL (ref 8.9–10.3)
Chloride: 101 mmol/L (ref 98–111)
Creatinine: 0.93 mg/dL (ref 0.61–1.24)
GFR, Estimated: >60 mL/min
Glucose, Bld: 93 mg/dL (ref 70–99)
Potassium: 4.8 mmol/L (ref 3.5–5.1)
Sodium: 132 mmol/L — ABNORMAL LOW (ref 135–145)
Total Bilirubin: 0.7 mg/dL (ref 0.3–1.2)
Total Protein: 6.9 g/dL (ref 6.5–8.1)

## 2020-02-07 MED ORDER — SODIUM CHLORIDE 0.9 % IV SOLN
Freq: Once | INTRAVENOUS | Status: AC
  Filled 2020-02-07: qty 250

## 2020-02-07 MED ORDER — SODIUM CHLORIDE 0.9 % IV SOLN
10.0000 mg/kg | Freq: Once | INTRAVENOUS | Status: AC
  Administered 2020-02-07: 860 mg via INTRAVENOUS
  Filled 2020-02-07: qty 7.2

## 2020-02-07 NOTE — Patient Instructions (Signed)
[facility] Discharge Instructions for Patients Receiving Chemotherapy  Today you received the following chemotherapy agents: DURVALUMAB.  To help prevent nausea and vomiting after your treatment, we encourage you to take your nausea medication as directed.   If you develop nausea and vomiting that is not controlled by your nausea medication, call the clinic.   BELOW ARE SYMPTOMS THAT SHOULD BE REPORTED IMMEDIATELY:  *FEVER GREATER THAN 100.5 F  *CHILLS WITH OR WITHOUT FEVER  NAUSEA AND VOMITING THAT IS NOT CONTROLLED WITH YOUR NAUSEA MEDICATION  *UNUSUAL SHORTNESS OF BREATH  *UNUSUAL BRUISING OR BLEEDING  TENDERNESS IN MOUTH AND THROAT WITH OR WITHOUT PRESENCE OF ULCERS  *URINARY PROBLEMS  *BOWEL PROBLEMS  UNUSUAL RASH Items with * indicate a potential emergency and should be followed up as soon as possible.  Feel free to call the clinic should you have any questions or concerns. The clinic phone number is [phone].  Please show the [facility] at check-in to the Emergency Department and triage nurse.

## 2020-02-07 NOTE — Progress Notes (Signed)
[place] [phone]   [phone]  OFFICE PROGRESS NOTE  [name], MD [place] [address]  DIAGNOSIS: Stage IIIA (T3, N1, M0) non-small cell lung cancer, squamous cell carcinoma diagnosed in July 2020 and presented with right middle lobe mass and right hilar adenopathy with postobstructive pneumonia and suspicious right pleural effusion.  PRIOR THERAPY:  Weekly concurrent chemoradiation with Carboplatin for an AUC of 2 and paclitaxel 45 mg/m2. First dose 11/23/2018. Status post 7 cycles.   CURRENT THERAPY: Consolidation immunotherapy with Imfinzi 10 mg/KG every 2 weeks.  First dose February 08, 2019.  Status post 24 cycles.  INTERVAL HISTORY: Joe Cole 69 y.o. male returns to the clinic today for follow-up visit.  The patient is feeling fine today with no concerning complaints except for mild cough.  He denied having any chest pain, shortness of breath, or hemoptysis.  He denied having any fever or chills.  He has no nausea, vomiting, diarrhea or constipation.  He has no headache or visual changes.  He denied having any weight loss or night sweats.  The patient is here today for evaluation before starting cycle #25 of his treatment.   MEDICAL HISTORY: Past Medical History:  Diagnosis Date  . Arthritis   . Atrial fibrillation ([place]) 10/2018  . COPD with emphysema ([place])   . Dyspnea   . Hypertension   . lung ca dx'd 10/2018    ALLERGIES:  has No Known Allergies.  MEDICATIONS:  Current Outpatient Medications  Medication Sig Dispense Refill  . acetaminophen (TYLENOL) 500 MG tablet Take 1,000 mg by mouth every 6 (six) hours as needed for moderate pain or headache.    . albuterol (PROAIR HFA) 108 (90 Base) MCG/ACT inhaler Inhale 2 puffs into the lungs every 6 (six) hours as needed for wheezing or shortness of breath. 1 Inhaler 5  . atorvastatin (LIPITOR) 20 MG tablet Take 20 mg by mouth daily.    Marland Kitchen  diltiazem (CARDIZEM CD) 360 MG 24 hr capsule Take 1 capsule (360 mg total) by mouth daily. 90 capsule 2  . ELIQUIS 5 MG TABS tablet TAKE ONE TABLET BY MOUTH TWICE DAILY 60 tablet 5  . levothyroxine (SYNTHROID) 75 MCG tablet Take 1 tablet (75 mcg total) by mouth daily before breakfast. 30 tablet 1  . metoprolol succinate (TOPROL XL) 50 MG 24 hr tablet Take 1 tablet (50 mg total) by mouth at bedtime. Take with or immediately following a meal. 30 tablet 11  . metoprolol succinate (TOPROL-XL) 25 MG 24 hr tablet TAKE 1 TABLET BY MOUTH EVERYDAY AT BEDTIME 90 tablet 3  . Omega-3 Fatty Acids (FISH OIL) 1200 MG CAPS Take by mouth.    . prochlorperazine (COMPAZINE) 10 MG tablet Take 1 tablet (10 mg total) by mouth every 6 (six) hours as needed for nausea or vomiting. 30 tablet 0  . traZODone (DESYREL) 50 MG tablet TAKE 1 TABLET BY MOUTH EVERYDAY AT BEDTIME 90 tablet 1   No current facility-administered medications for this visit.    SURGICAL HISTORY:  Past Surgical History:  Procedure Laterality Date  . CARDIOVERSION N/A 01/11/2019   Procedure: CARDIOVERSION;  Surgeon: Josue Hector, MD;  Location: [facility] ENDOSCOPY;  Service: Cardiovascular;  Laterality: N/A;  . KNEE ARTHROSCOPY     BIL   . TOE SURGERY     PINNED LEFT BIG TOE  . TONSILLECTOMY     T+A  AS CHILD  . TOTAL  KNEE ARTHROPLASTY Left 08/19/2017   Procedure: TOTAL KNEE ARTHROPLASTY;  Surgeon: Renette Butters, MD;  Location: [place];  Service: Orthopedics;  Laterality: Left;  Marland Kitchen VIDEO BRONCHOSCOPY Bilateral 11/05/2018   Procedure: VIDEO BRONCHOSCOPY WITH FLUORO;  Surgeon: [name], MD;  Location: [facility] ENDOSCOPY;  Service: Cardiopulmonary;  Laterality: Bilateral;  . VIDEO BRONCHOSCOPY Bilateral 11/09/2018   Procedure: VIDEO BRONCHOSCOPY WITH FLUORO;  Surgeon: Rigoberto Noel, MD;  Location: [facility];  Service: Cardiopulmonary;  Laterality: Bilateral;    REVIEW OF SYSTEMS:  A comprehensive review of systems was negative except for: Respiratory:  positive for cough   PHYSICAL EXAMINATION: General appearance: alert, cooperative and no distress Head: Normocephalic, without obvious abnormality, atraumatic Neck: no adenopathy, no JVD, supple, symmetrical, trachea midline and thyroid not enlarged, symmetric, no tenderness/mass/nodules Lymph nodes: Cervical, supraclavicular, and axillary nodes normal. Resp: clear to auscultation bilaterally Back: symmetric, no curvature. ROM normal. No CVA tenderness. Cardio: regular rate and rhythm, S1, S2 normal, no murmur, click, rub or gallop GI: soft, non-tender; bowel sounds normal; no masses,  no organomegaly Extremities: extremities normal, atraumatic, no cyanosis or edema  ECOG PERFORMANCE STATUS: 1 - Symptomatic but completely ambulatory  Blood pressure 128/89, pulse 64, temperature 97.6 F (36.4 C), temperature source Tympanic, resp. rate 18, height 6\' 1"  (1.854 m), weight 193 lb 4.8 oz (87.7 kg), SpO2 96 %.  LABORATORY DATA: Lab Results  Component Value Date   WBC 5.6 01/24/2020   HGB 14.5 01/24/2020   HCT 40.0 01/24/2020   MCV 113.6 (H) 01/24/2020   PLT 183 01/24/2020      Chemistry      Component Value Date/Time   NA 133 (L) 01/24/2020 0759   K 4.7 01/24/2020 0759   CL 101 01/24/2020 0759   CO2 22 01/24/2020 0759   BUN 12 01/24/2020 0759   CREATININE 0.85 01/24/2020 0759      Component Value Date/Time   CALCIUM 9.4 01/24/2020 0759   ALKPHOS 75 01/24/2020 0759   AST 36 01/24/2020 0759   ALT 33 01/24/2020 0759   BILITOT 0.8 01/24/2020 0759       RADIOGRAPHIC STUDIES: No results found.  ASSESSMENT AND PLAN: This is a very pleasant 69 years old white male with stage IIIA non-small cell lung cancer, squamous cell carcinoma.  The patient underwent a course of concurrent chemoradiation with weekly carboplatin and paclitaxel status post 7 cycles.  The patient tolerated his treatment well except for mild fatigue and odynophagia. The patient is currently undergoing  consolidation treatment with immunotherapy with Imfinzi every 2 weeks status post 24 cycles.   The patient continues to tolerate his treatment well with no concerning adverse effects. I recommended for him to proceed with cycle #25 today as planned. I will see him back for follow-up visit in 2 weeks before the last cycle of his treatment. For the hypothyroidism, he is currently on levothyroxine 75 mcg p.o. daily.  We will continue to monitor his TSH closely. For the history of atrial fibrillation, he is currently on treatment with Eliquis. He was advised to call immediately if he has any concerning symptoms in the interval. The patient voices understanding of current disease status and treatment options and is in agreement with the current care plan. All questions were answered. The patient knows to call the clinic with any problems, questions or concerns. We can certainly see the patient much sooner if necessary.  Disclaimer: This note was dictated with voice recognition software. Similar sounding words can inadvertently be  transcribed and may not be corrected upon review.

## 2020-02-18 NOTE — Progress Notes (Signed)
[place] OFFICE PROGRESS NOTE  [name], MD [place] [address]  DIAGNOSIS: Stage IIIA (T3, N1, M0) non-small cell lung cancer, squamous cell carcinoma diagnosed in July 2020 and presented with right middle lobe mass and right hilar adenopathy with postobstructive pneumonia and suspicious right pleural effusion.  PRIOR THERAPY: Weekly concurrent chemoradiation with Carboplatin for an AUC of 2 and paclitaxel 45 mg/m2. First dose 11/23/2018. Status post 7 cycles.  CURRENT THERAPY: Consolidation immunotherapy with Imfinzi 10 mg/KG every 2 weeks. First dose February 08, 2019. Status post25cycles  INTERVAL HISTORY: Joe Cole 69 y.o. male returns to the clinic for a follow up visit. The patient is feeling well today without any concerning complaints except for a cough. The cough is mostly dry. The patient attributes his cough to post nasal drainage due to it being worse in the AM and having a history of sinus trouble. He does not take anything for his cough. He states the cough started about 2 weeks ago. He reports baseline dyspnea on exertion but states that it is the same. The patient denies sore throat. He is scheduled to get his COVID-19 booster vaccine on Saturday. He is interested in receiving a flu shot today. The patient continues to tolerate treatment with immunotherapy with Imfinzi well without any adverse effects. Denies any fever, chills, night sweats, or weight loss. Denies any chest pain or hemoptysis.  Denies any nausea, vomiting, diarrhea, or constipation. Denies any headache or visual changes. Denies any rashes or skin changes. The patient is here today for evaluation prior to starting cycle # 26  MEDICAL HISTORY: Past Medical History:  Diagnosis Date  . Arthritis   . Atrial fibrillation ([place]) 10/2018  . COPD with emphysema ([place])   . Dyspnea   . Hypertension   . lung ca dx'd 10/2018    ALLERGIES:  has No Known  Allergies.  MEDICATIONS:  Current Outpatient Medications  Medication Sig Dispense Refill  . acetaminophen (TYLENOL) 500 MG tablet Take 1,000 mg by mouth every 6 (six) hours as needed for moderate pain or headache.    . albuterol (PROAIR HFA) 108 (90 Base) MCG/ACT inhaler Inhale 2 puffs into the lungs every 6 (six) hours as needed for wheezing or shortness of breath. 1 Inhaler 5  . atorvastatin (LIPITOR) 20 MG tablet Take 20 mg by mouth daily.    Marland Kitchen diltiazem (CARDIZEM CD) 360 MG 24 hr capsule Take 1 capsule (360 mg total) by mouth daily. 90 capsule 2  . ELIQUIS 5 MG TABS tablet TAKE ONE TABLET BY MOUTH TWICE DAILY 60 tablet 5  . levothyroxine (SYNTHROID) 75 MCG tablet Take 1 tablet (75 mcg total) by mouth daily before breakfast. 30 tablet 1  . metoprolol succinate (TOPROL XL) 50 MG 24 hr tablet Take 1 tablet (50 mg total) by mouth at bedtime. Take with or immediately following a meal. 30 tablet 11  . metoprolol succinate (TOPROL-XL) 25 MG 24 hr tablet TAKE 1 TABLET BY MOUTH EVERYDAY AT BEDTIME 90 tablet 3  . Omega-3 Fatty Acids (FISH OIL) 1200 MG CAPS Take by mouth.    . prochlorperazine (COMPAZINE) 10 MG tablet Take 1 tablet (10 mg total) by mouth every 6 (six) hours as needed for nausea or vomiting. 30 tablet 0  . traZODone (DESYREL) 50 MG tablet TAKE 1 TABLET BY MOUTH EVERYDAY AT BEDTIME 90 tablet 1   No current facility-administered medications for this visit.    SURGICAL HISTORY:  Past  Surgical History:  Procedure Laterality Date  . CARDIOVERSION N/A 01/11/2019   Procedure: CARDIOVERSION;  Surgeon: Josue Hector, MD;  Location: [facility] ENDOSCOPY;  Service: Cardiovascular;  Laterality: N/A;  . KNEE ARTHROSCOPY     BIL   . TOE SURGERY     PINNED LEFT BIG TOE  . TONSILLECTOMY     T+A  AS CHILD  . TOTAL KNEE ARTHROPLASTY Left 08/19/2017   Procedure: TOTAL KNEE ARTHROPLASTY;  Surgeon: Renette Butters, MD;  Location: [place];  Service: Orthopedics;  Laterality: Left;  Marland Kitchen VIDEO BRONCHOSCOPY  Bilateral 11/05/2018   Procedure: VIDEO BRONCHOSCOPY WITH FLUORO;  Surgeon: [name], MD;  Location: [facility] ENDOSCOPY;  Service: Cardiopulmonary;  Laterality: Bilateral;  . VIDEO BRONCHOSCOPY Bilateral 11/09/2018   Procedure: VIDEO BRONCHOSCOPY WITH FLUORO;  Surgeon: Rigoberto Noel, MD;  Location: [place];  Service: Cardiopulmonary;  Laterality: Bilateral;    REVIEW OF SYSTEMS:   Review of Systems  Constitutional: Negative for appetite change, chills, fatigue, fever and unexpected weight change.  HENT: Negative for mouth sores, nosebleeds, sore throat and trouble swallowing.   Eyes: Negative for eye problems and icterus.  Respiratory: Positive for cough and shortness of breath with exertion. Negative for hemoptysis and wheezing.   Cardiovascular: Negative for chest pain and leg swelling.  Gastrointestinal: Negative for abdominal pain, constipation, diarrhea, nausea and vomiting.  Genitourinary: Negative for bladder incontinence, difficulty urinating, dysuria, frequency and hematuria.   Musculoskeletal: Negative for back pain, gait problem, neck pain and neck stiffness.  Skin: Negative for itching and rash.  Neurological: Negative for dizziness, extremity weakness, gait problem, headaches, light-headedness and seizures.  Hematological: Negative for adenopathy. Does not bruise/bleed easily.  Psychiatric/Behavioral: Negative for confusion, depression and sleep disturbance. The patient is not nervous/anxious.     PHYSICAL EXAMINATION:  Blood pressure (!) 126/94, pulse (!) 101, temperature 97.7 F (36.5 C), temperature source Tympanic, resp. rate 18, height 6\' 1"  (1.854 m), weight 194 lb 1.6 oz (88 kg), SpO2 99 %.  ECOG PERFORMANCE STATUS: 1 - Symptomatic but completely ambulatory  Physical Exam  Constitutional: Oriented to person, place, and time and well-developed, well-nourished, and in no distress.  HENT:  Head: Normocephalic and atraumatic.  Mouth/Throat: Oropharynx is clear and  moist. No oropharyngeal exudate.  Eyes: Conjunctivae are normal. Right eye exhibits no discharge. Left eye exhibits no discharge. No scleral icterus.  Neck: Normal range of motion. Neck supple.  Cardiovascular: Normal rate, irregular rhythm, normal heart sounds and intact distal pulses.  Pulmonary/Chest: Effort normal. Positive for some rhonchi. No respiratory distress. No wheezes. No rales.  Abdominal: Soft. Bowel sounds are normal. Exhibits no distension and no mass. There is no tenderness.  Musculoskeletal: Normal range of motion. Exhibits no edema.  Lymphadenopathy:    No cervical adenopathy.  Neurological: Alert and oriented to person, place, and time. Exhibits normal muscle tone. Gait normal. Coordination normal.  Skin: Skin is warm and dry. No rash noted. Not diaphoretic. No erythema. No pallor.  Psychiatric: Mood, memory and judgment normal.  Vitals reviewed.  LABORATORY DATA: Lab Results  Component Value Date   WBC 7.0 02/22/2020   HGB 13.7 02/22/2020   HCT 38.5 (L) 02/22/2020   MCV 114.6 (H) 02/22/2020   PLT 191 02/22/2020      Chemistry      Component Value Date/Time   NA 132 (L) 02/07/2020 0929   K 4.8 02/07/2020 0929   CL 101 02/07/2020 0929   CO2 22 02/07/2020 0929   BUN 11 02/07/2020  0981   CREATININE 0.93 02/07/2020 0929      Component Value Date/Time   CALCIUM 9.4 02/07/2020 0929   ALKPHOS 79 02/07/2020 0929   AST 32 02/07/2020 0929   ALT 34 02/07/2020 0929   BILITOT 0.7 02/07/2020 0929       RADIOGRAPHIC STUDIES:  No results found.   ASSESSMENT/PLAN:  This is a very pleasant 69 year old Caucasian male diagnosed with stage IIIa (T3,N1,M0) non-small cell lung cancer, squamous cell carcinoma.He presented with a right middle lobe lung mass and right hilar adenopathy with postobstructive pneumonia and a suspicious right pleural effusion. He was diagnosed in July 2020.  The patient completed 5 cycles of weekly concurrent chemoradiation with  carboplatin for an AUC of 2 and paclitaxel 45 mgm/2.   He is currently undergoing consolidation immunotherapy with Imfinzi 10 mg/kg IV every 2 weeks. He is status post25cycles andhe istoleratingit well without any adverse side effects. He is scheduled for his last treatment today.   Labs were reviewed. Recommend that he proceed with cycle #26 today as scheduled.   I will arrange for a restaging CT scan of the chest to be performed. Since he is having a cough, I will move up his scan to be performed in 2 weeks or so. His oxygen saturation is 99%. He is non-toxic appearing and afebrile. His shortness of the breath is unchanged from his baseline. He was advised to try using an OTC cough medication if needed for his cough.   We will see him back for a follow up visit in 2 weeks for evaluation and to review his scan results.   We will arrange for the patient to review a flu shot while in the clinic today.   The patient was advised to call immediately if he has any concerning symptoms in the interval. The patient voices understanding of current disease status and treatment options and is in agreement with the current care plan. All questions were answered. The patient knows to call the clinic with any problems, questions or concerns. We can certainly see the patient much sooner if necessary        Orders Placed This Encounter  Procedures  . CT Chest W Contrast    Standing Status:   Future    Standing Expiration Date:   02/21/2021    Order Specific Question:   If indicated for the ordered procedure, I authorize the administration of contrast media per Radiology protocol    Answer:   Yes    Order Specific Question:   Preferred imaging location?    Answer:   [place], PA-C 02/22/20

## 2020-02-21 ENCOUNTER — Other Ambulatory Visit: Payer: Self-pay

## 2020-02-21 DIAGNOSIS — C3491 Malignant neoplasm of unspecified part of right bronchus or lung: Secondary | ICD-10-CM

## 2020-02-22 ENCOUNTER — Other Ambulatory Visit: Payer: Self-pay

## 2020-02-22 ENCOUNTER — Inpatient Hospital Stay: Payer: Medicare HMO

## 2020-02-22 ENCOUNTER — Inpatient Hospital Stay (HOSPITAL_BASED_OUTPATIENT_CLINIC_OR_DEPARTMENT_OTHER): Payer: Medicare HMO | Admitting: Physician Assistant

## 2020-02-22 VITALS — HR 55

## 2020-02-22 VITALS — BP 126/94 | HR 101 | Temp 97.7°F | Resp 18 | Ht 73.0 in | Wt 194.1 lb

## 2020-02-22 DIAGNOSIS — C3431 Malignant neoplasm of lower lobe, right bronchus or lung: Secondary | ICD-10-CM

## 2020-02-22 DIAGNOSIS — C3491 Malignant neoplasm of unspecified part of right bronchus or lung: Secondary | ICD-10-CM | POA: Diagnosis not present

## 2020-02-22 DIAGNOSIS — Z5112 Encounter for antineoplastic immunotherapy: Secondary | ICD-10-CM | POA: Diagnosis not present

## 2020-02-22 DIAGNOSIS — Z23 Encounter for immunization: Secondary | ICD-10-CM

## 2020-02-22 LAB — CMP (CANCER CENTER ONLY)
ALT: 23 U/L (ref 0–44)
AST: 25 U/L (ref 15–41)
Albumin: 3.8 g/dL (ref 3.5–5.0)
Alkaline Phosphatase: 77 U/L (ref 38–126)
Anion gap: 11 (ref 5–15)
BUN: 13 mg/dL (ref 8–23)
CO2: 21 mmol/L — ABNORMAL LOW (ref 22–32)
Calcium: 9.5 mg/dL (ref 8.9–10.3)
Chloride: 102 mmol/L (ref 98–111)
Creatinine: 0.86 mg/dL (ref 0.61–1.24)
GFR, Estimated: >60 mL/min (ref >60)
Glucose, Bld: 63 mg/dL — ABNORMAL LOW (ref 70–99)
Potassium: 4.5 mmol/L (ref 3.5–5.1)
Sodium: 134 mmol/L — ABNORMAL LOW (ref 135–145)
Total Bilirubin: 0.9 mg/dL (ref 0.3–1.2)
Total Protein: 6.9 g/dL (ref 6.5–8.1)

## 2020-02-22 LAB — CBC WITH DIFFERENTIAL (CANCER CENTER ONLY)
Abs Immature Granulocytes: 0.03 10*3/uL (ref 0.00–0.07)
Basophils Absolute: 0.1 10*3/uL (ref 0.0–0.1)
Basophils Relative: 1 %
Eosinophils Absolute: 0.1 10*3/uL (ref 0.0–0.5)
Eosinophils Relative: 1 %
HCT: 38.5 % — ABNORMAL LOW (ref 39.0–52.0)
Hemoglobin: 13.7 g/dL (ref 13.0–17.0)
Immature Granulocytes: 0 %
Lymphocytes Relative: 20 %
Lymphs Abs: 1.4 10*3/uL (ref 0.7–4.0)
MCH: 40.8 pg — ABNORMAL HIGH (ref 26.0–34.0)
MCHC: 35.6 g/dL (ref 30.0–36.0)
MCV: 114.6 fL — ABNORMAL HIGH (ref 80.0–100.0)
Monocytes Absolute: 0.6 10*3/uL (ref 0.1–1.0)
Monocytes Relative: 8 %
Neutro Abs: 4.9 10*3/uL (ref 1.7–7.7)
Neutrophils Relative %: 70 %
Platelet Count: 191 10*3/uL (ref 150–400)
RBC: 3.36 MIL/uL — ABNORMAL LOW (ref 4.22–5.81)
RDW: 13.4 % (ref 11.5–15.5)
WBC Count: 7 10*3/uL (ref 4.0–10.5)
nRBC: 0 % (ref 0.0–0.2)

## 2020-02-22 LAB — TSH: TSH: 16.033 u[IU]/mL — ABNORMAL HIGH (ref 0.320–4.118)

## 2020-02-22 MED ORDER — INFLUENZA VAC A&B SA ADJ QUAD 0.5 ML IM PRSY
PREFILLED_SYRINGE | INTRAMUSCULAR | Status: AC
  Filled 2020-02-22: qty 0.5

## 2020-02-22 MED ORDER — SODIUM CHLORIDE 0.9 % IV SOLN
10.0000 mg/kg | Freq: Once | INTRAVENOUS | Status: AC
  Administered 2020-02-22: 860 mg via INTRAVENOUS
  Filled 2020-02-22: qty 7.2

## 2020-02-22 MED ORDER — INFLUENZA VAC A&B SA ADJ QUAD 0.5 ML IM PRSY
0.5000 mL | PREFILLED_SYRINGE | Freq: Once | INTRAMUSCULAR | Status: AC
  Administered 2020-02-22: 0.5 mL via INTRAMUSCULAR

## 2020-02-22 MED ORDER — SODIUM CHLORIDE 0.9 % IV SOLN
Freq: Once | INTRAVENOUS | Status: AC
  Filled 2020-02-22: qty 250

## 2020-02-22 NOTE — Progress Notes (Signed)
Per Cassie Heilingoetter, PA drink and snack provided for pt upon arrival to infusion room

## 2020-02-22 NOTE — Patient Instructions (Signed)
[facility] Discharge Instructions for Patients Receiving Chemotherapy  Today you received the following chemotherapy agents: Imfinzi.  To help prevent nausea and vomiting after your treatment, we encourage you to take your nausea medication as directed.   If you develop nausea and vomiting that is not controlled by your nausea medication, call the clinic.   BELOW ARE SYMPTOMS THAT SHOULD BE REPORTED IMMEDIATELY:  *FEVER GREATER THAN 100.5 F  *CHILLS WITH OR WITHOUT FEVER  NAUSEA AND VOMITING THAT IS NOT CONTROLLED WITH YOUR NAUSEA MEDICATION  *UNUSUAL SHORTNESS OF BREATH  *UNUSUAL BRUISING OR BLEEDING  TENDERNESS IN MOUTH AND THROAT WITH OR WITHOUT PRESENCE OF ULCERS  *URINARY PROBLEMS  *BOWEL PROBLEMS  UNUSUAL RASH Items with * indicate a potential emergency and should be followed up as soon as possible.  Feel free to call the clinic should you have any questions or concerns. The clinic phone number is [phone].  Please show the CHEMO ALERT CARD at check-in to the Emergency Department and triage nurse.   

## 2020-02-23 ENCOUNTER — Telehealth: Payer: Self-pay | Admitting: Physician Assistant

## 2020-02-23 NOTE — Telephone Encounter (Signed)
Scheduled per los. Called and left msg. Mailed printout  °

## 2020-02-26 ENCOUNTER — Ambulatory Visit: Payer: Medicare HMO | Attending: Internal Medicine

## 2020-02-26 DIAGNOSIS — Z23 Encounter for immunization: Secondary | ICD-10-CM

## 2020-02-26 NOTE — Progress Notes (Signed)
   Covid-19 Vaccination Clinic  Name:  PEIRCE DEVENEY    MRN: [id number] DOB: [birth year]  02/26/2020  Mr. Ulbrich was observed post Covid-19 immunization for 15 minutes without incident. He was provided with Vaccine Information Sheet and instruction to access the V-Safe system.   Mr. Boyte was instructed to call 911 with any severe reactions post vaccine: Marland Kitchen Difficulty breathing  . Swelling of face and throat  . A fast heartbeat  . A bad rash all over body  . Dizziness and weakness

## 2020-02-28 ENCOUNTER — Telehealth: Payer: Self-pay | Admitting: Internal Medicine

## 2020-02-28 NOTE — Telephone Encounter (Signed)
Called pt per 11/1 sch msg - no answer. Left message for patient to call back

## 2020-03-07 DIAGNOSIS — J449 Chronic obstructive pulmonary disease, unspecified: Secondary | ICD-10-CM | POA: Diagnosis not present

## 2020-03-07 DIAGNOSIS — I10 Essential (primary) hypertension: Secondary | ICD-10-CM | POA: Diagnosis not present

## 2020-03-07 DIAGNOSIS — E7800 Pure hypercholesterolemia, unspecified: Secondary | ICD-10-CM | POA: Diagnosis not present

## 2020-03-07 DIAGNOSIS — C3491 Malignant neoplasm of unspecified part of right bronchus or lung: Secondary | ICD-10-CM | POA: Diagnosis not present

## 2020-03-07 DIAGNOSIS — M1990 Unspecified osteoarthritis, unspecified site: Secondary | ICD-10-CM | POA: Diagnosis not present

## 2020-03-07 DIAGNOSIS — E785 Hyperlipidemia, unspecified: Secondary | ICD-10-CM | POA: Diagnosis not present

## 2020-03-09 ENCOUNTER — Ambulatory Visit: Payer: Medicare HMO | Admitting: Internal Medicine

## 2020-03-09 ENCOUNTER — Ambulatory Visit (HOSPITAL_COMMUNITY)
Admission: RE | Admit: 2020-03-09 | Discharge: 2020-03-09 | Disposition: A | Discharge status: Home / Self Care | Payer: Medicare HMO | Source: Ambulatory Visit | Attending: Physician Assistant | Admitting: Physician Assistant

## 2020-03-09 ENCOUNTER — Other Ambulatory Visit: Payer: Self-pay

## 2020-03-09 DIAGNOSIS — C3491 Malignant neoplasm of unspecified part of right bronchus or lung: Secondary | ICD-10-CM | POA: Diagnosis not present

## 2020-03-09 DIAGNOSIS — I2510 Atherosclerotic heart disease of native coronary artery without angina pectoris: Secondary | ICD-10-CM | POA: Diagnosis not present

## 2020-03-09 DIAGNOSIS — C3490 Malignant neoplasm of unspecified part of unspecified bronchus or lung: Secondary | ICD-10-CM | POA: Diagnosis not present

## 2020-03-09 DIAGNOSIS — I700 Atherosclerosis of aorta: Secondary | ICD-10-CM | POA: Diagnosis not present

## 2020-03-09 MED ORDER — IOHEXOL 300 MG/ML  SOLN
75.0000 mL | Freq: Once | INTRAMUSCULAR | Status: AC | PRN
  Administered 2020-03-09: 75 mL via INTRAVENOUS

## 2020-03-13 ENCOUNTER — Encounter: Payer: Self-pay | Admitting: Internal Medicine

## 2020-03-13 ENCOUNTER — Other Ambulatory Visit: Payer: Self-pay

## 2020-03-13 ENCOUNTER — Inpatient Hospital Stay: Payer: Medicare HMO | Attending: Internal Medicine | Admitting: Internal Medicine

## 2020-03-13 VITALS — BP 134/83 | HR 81 | Temp 97.5°F | Resp 17 | Ht 73.0 in | Wt 191.8 lb

## 2020-03-13 DIAGNOSIS — C3490 Malignant neoplasm of unspecified part of unspecified bronchus or lung: Secondary | ICD-10-CM

## 2020-03-13 DIAGNOSIS — Z79899 Other long term (current) drug therapy: Secondary | ICD-10-CM | POA: Insufficient documentation

## 2020-03-13 DIAGNOSIS — C3431 Malignant neoplasm of lower lobe, right bronchus or lung: Secondary | ICD-10-CM | POA: Diagnosis not present

## 2020-03-13 DIAGNOSIS — E039 Hypothyroidism, unspecified: Secondary | ICD-10-CM | POA: Insufficient documentation

## 2020-03-13 DIAGNOSIS — C3491 Malignant neoplasm of unspecified part of right bronchus or lung: Secondary | ICD-10-CM | POA: Diagnosis not present

## 2020-03-13 DIAGNOSIS — I4891 Unspecified atrial fibrillation: Secondary | ICD-10-CM | POA: Insufficient documentation

## 2020-03-13 DIAGNOSIS — I10 Essential (primary) hypertension: Secondary | ICD-10-CM | POA: Diagnosis not present

## 2020-03-13 DIAGNOSIS — C342 Malignant neoplasm of middle lobe, bronchus or lung: Secondary | ICD-10-CM | POA: Diagnosis not present

## 2020-03-13 NOTE — Progress Notes (Signed)
[place] [phone]   [phone]  OFFICE PROGRESS NOTE  [name], MD [place] [address]  DIAGNOSIS: Stage IIIA (T3, N1, M0) non-small cell lung cancer, squamous cell carcinoma diagnosed in July 2020 and presented with right middle lobe mass and right hilar adenopathy with postobstructive pneumonia and suspicious right pleural effusion.  PRIOR THERAPY:   1) Weekly concurrent chemoradiation with Carboplatin for an AUC of 2 and paclitaxel 45 mg/m2. First dose 11/23/2018. Status post 7 cycles.  2) Consolidation immunotherapy with Imfinzi 10 mg/KG every 2 weeks.  First dose February 08, 2019.  Status post 26 cycles.  CURRENT THERAPY: Observation.  INTERVAL HISTORY: Joe Cole 69 y.o. male returns to the clinic today for follow-up visit accompanied by his wife.  The patient is feeling fine today with no concerning complaints except for the persistent baseline shortness of breath and mild cough.  He denied having any current chest pain or hemoptysis.  He denied having any nausea, vomiting, diarrhea or constipation.  He denied having any headache or visual changes.  He has no significant weight loss or night sweats.  The patient completed a course of consolidation immunotherapy and he is here today for evaluation with repeat CT scan of the chest for restaging of his disease.   MEDICAL HISTORY: Past Medical History:  Diagnosis Date  . Arthritis   . Atrial fibrillation ([place]) 10/2018  . COPD with emphysema ([place])   . Dyspnea   . Hypertension   . lung ca dx'd 10/2018    ALLERGIES:  has No Known Allergies.  MEDICATIONS:  Current Outpatient Medications  Medication Sig Dispense Refill  . acetaminophen (TYLENOL) 500 MG tablet Take 1,000 mg by mouth every 6 (six) hours as needed for moderate pain or headache.    . albuterol (PROAIR HFA) 108 (90 Base) MCG/ACT inhaler Inhale 2 puffs into the lungs every 6 (six)  hours as needed for wheezing or shortness of breath. 1 Inhaler 5  . atorvastatin (LIPITOR) 20 MG tablet Take 20 mg by mouth daily.    Marland Kitchen diltiazem (CARDIZEM CD) 360 MG 24 hr capsule Take 1 capsule (360 mg total) by mouth daily. 90 capsule 2  . ELIQUIS 5 MG TABS tablet TAKE ONE TABLET BY MOUTH TWICE DAILY 60 tablet 5  . levothyroxine (SYNTHROID) 75 MCG tablet Take 1 tablet (75 mcg total) by mouth daily before breakfast. 30 tablet 1  . metoprolol succinate (TOPROL XL) 50 MG 24 hr tablet Take 1 tablet (50 mg total) by mouth at bedtime. Take with or immediately following a meal. 30 tablet 11  . metoprolol succinate (TOPROL-XL) 25 MG 24 hr tablet TAKE 1 TABLET BY MOUTH EVERYDAY AT BEDTIME 90 tablet 3  . Omega-3 Fatty Acids (FISH OIL) 1200 MG CAPS Take by mouth.    . prochlorperazine (COMPAZINE) 10 MG tablet Take 1 tablet (10 mg total) by mouth every 6 (six) hours as needed for nausea or vomiting. 30 tablet 0  . traZODone (DESYREL) 50 MG tablet TAKE 1 TABLET BY MOUTH EVERYDAY AT BEDTIME 90 tablet 1   No current facility-administered medications for this visit.    SURGICAL HISTORY:  Past Surgical History:  Procedure Laterality Date  . CARDIOVERSION N/A 01/11/2019   Procedure: CARDIOVERSION;  Surgeon: Josue Hector, MD;  Location: [facility] ENDOSCOPY;  Service: Cardiovascular;  Laterality: N/A;  . KNEE ARTHROSCOPY     BIL   . TOE SURGERY  PINNED LEFT BIG TOE  . TONSILLECTOMY     T+A  AS CHILD  . TOTAL KNEE ARTHROPLASTY Left 08/19/2017   Procedure: TOTAL KNEE ARTHROPLASTY;  Surgeon: Renette Butters, MD;  Location: [place];  Service: Orthopedics;  Laterality: Left;  Marland Kitchen VIDEO BRONCHOSCOPY Bilateral 11/05/2018   Procedure: VIDEO BRONCHOSCOPY WITH FLUORO;  Surgeon: [name], MD;  Location: [facility] ENDOSCOPY;  Service: Cardiopulmonary;  Laterality: Bilateral;  . VIDEO BRONCHOSCOPY Bilateral 11/09/2018   Procedure: VIDEO BRONCHOSCOPY WITH FLUORO;  Surgeon: Rigoberto Noel, MD;  Location: [place];  Service:  Cardiopulmonary;  Laterality: Bilateral;    REVIEW OF SYSTEMS:  Constitutional: negative Eyes: negative Ears, nose, mouth, throat, and face: negative Respiratory: positive for cough and dyspnea on exertion Cardiovascular: negative Gastrointestinal: negative Genitourinary:negative Integument/breast: negative Hematologic/lymphatic: negative Musculoskeletal:negative Neurological: negative Behavioral/Psych: negative Endocrine: negative Allergic/Immunologic: negative   PHYSICAL EXAMINATION: General appearance: alert, cooperative and no distress Head: Normocephalic, without obvious abnormality, atraumatic Neck: no adenopathy, no JVD, supple, symmetrical, trachea midline and thyroid not enlarged, symmetric, no tenderness/mass/nodules Lymph nodes: Cervical, supraclavicular, and axillary nodes normal. Resp: clear to auscultation bilaterally Back: symmetric, no curvature. ROM normal. No CVA tenderness. Cardio: regular rate and rhythm, S1, S2 normal, no murmur, click, rub or gallop GI: soft, non-tender; bowel sounds normal; no masses,  no organomegaly Extremities: extremities normal, atraumatic, no cyanosis or edema Neurologic: Alert and oriented X 3, normal strength and tone. Normal symmetric reflexes. Normal coordination and gait  ECOG PERFORMANCE STATUS: 1 - Symptomatic but completely ambulatory  Blood pressure 134/83, pulse 81, temperature (!) 97.5 F (36.4 C), temperature source Tympanic, resp. rate 17, height 6\' 1"  (1.854 m), weight 191 lb 12.8 oz (87 kg), SpO2 100 %.  LABORATORY DATA: Lab Results  Component Value Date   WBC 7.0 02/22/2020   HGB 13.7 02/22/2020   HCT 38.5 (L) 02/22/2020   MCV 114.6 (H) 02/22/2020   PLT 191 02/22/2020      Chemistry      Component Value Date/Time   NA 134 (L) 02/22/2020 0753   K 4.5 02/22/2020 0753   CL 102 02/22/2020 0753   CO2 21 (L) 02/22/2020 0753   BUN 13 02/22/2020 0753   CREATININE 0.86 02/22/2020 0753      Component Value  Date/Time   CALCIUM 9.5 02/22/2020 0753   ALKPHOS 77 02/22/2020 0753   AST 25 02/22/2020 0753   ALT 23 02/22/2020 0753   BILITOT 0.9 02/22/2020 0753       RADIOGRAPHIC STUDIES: CT Chest W Contrast  Result Date: 03/09/2020 CLINICAL DATA:  Primary Cancer Type: Non-small cell lung carcinoma Imaging Indication: Routine surveillance Interval therapy since last imaging? Yes Initial Cancer Diagnosis Date: 11/09/2018; Established by: Biopsy-proven Detailed Pathology: Stage IIIA non-small cell lung cancer, squamous cell carcinoma. Primary Tumor location:  Right middle lobe. Surgeries: No. Chemotherapy: Yes; Ongoing?  No; Most recent administration: 12/2018 Immunotherapy?  Yes; Type: Imfinzi; Ongoing? Yes Radiation therapy? Yes; Date Range: 11/19/2018 - 01/05/2019; Target: Right perihilar EXAM: CT CHEST WITH CONTRAST TECHNIQUE: Multidetector CT imaging of the chest was performed during intravenous contrast administration. CONTRAST:  19mL OMNIPAQUE IOHEXOL 300 MG/ML  SOLN COMPARISON:  Most recent CT chest 10/15/2019. FINDINGS: Cardiovascular: No acute findings. Aortic and coronary atherosclerotic calcification noted. Mediastinum/Nodes: No masses or pathologically enlarged lymph nodes identified. Lungs/Pleura: Post radiation changes in the central right perihilar and paramediastinal regions is stable in appearance. Mild right pleural thickening versus small effusion is also unchanged. No suspicious nodules or masses identified. Upper Abdomen:  Stable bilateral adrenal gland thickening. Stable focal fatty infiltration in the posterior right hepatic lobe. Musculoskeletal:  No suspicious bone lesions. IMPRESSION: Stable post radiation changes in right lung. Stable small right pleural effusion versus pleural thickening. No evidence of recurrent or metastatic carcinoma within the thorax. Aortic Atherosclerosis (ICD10-I70.0). Electronically Signed   By: Marlaine Hind M.D.   On: 03/09/2020 09:53    ASSESSMENT AND PLAN:  This is a very pleasant 68 years old white male with stage IIIA non-small cell lung cancer, squamous cell carcinoma.  The patient underwent a course of concurrent chemoradiation with weekly carboplatin and paclitaxel status post 7 cycles.  The patient tolerated his treatment well except for mild fatigue and odynophagia. The patient completed a course of consolidation treatment with immunotherapy with Imfinzi every 2 weeks status post 26 cycles.  He tolerated his treatment well with no concerning adverse effects. The patient had repeat CT scan of the chest performed recently.  I personally and independently reviewed the scan results with the patient and his wife. His scan showed no evidence for recurrent or metastatic disease within the chest. I recommended for him to continue on observation with repeat CT scan of the chest in 3 months. For the hypothyroidism, he will continue his current treatment with levothyroxine and we will monitor his TSH with the upcoming lab. For the history of atrial fibrillation, he will continue his current treatment with Eliquis. The patient was advised to call immediately if he has any concerning symptoms in the interval. The patient voices understanding of current disease status and treatment options and is in agreement with the current care plan. All questions were answered. The patient knows to call the clinic with any problems, questions or concerns. We can certainly see the patient much sooner if necessary.  Disclaimer: This note was dictated with voice recognition software. Similar sounding words can inadvertently be transcribed and may not be corrected upon review.

## 2020-03-14 ENCOUNTER — Telehealth: Payer: Self-pay | Admitting: Internal Medicine

## 2020-03-14 NOTE — Telephone Encounter (Signed)
Scheduled per los. Called and spoke with patient. Confirmed appt 

## 2020-03-16 ENCOUNTER — Ambulatory Visit: Payer: Medicare HMO | Admitting: Cardiology

## 2020-03-28 ENCOUNTER — Other Ambulatory Visit: Payer: Self-pay

## 2020-03-28 ENCOUNTER — Ambulatory Visit (INDEPENDENT_AMBULATORY_CARE_PROVIDER_SITE_OTHER): Payer: Medicare HMO | Admitting: Cardiology

## 2020-03-28 ENCOUNTER — Encounter: Payer: Self-pay | Admitting: Cardiology

## 2020-03-28 VITALS — BP 100/60 | HR 85 | Ht 73.0 in | Wt 194.2 lb

## 2020-03-28 DIAGNOSIS — I10 Essential (primary) hypertension: Secondary | ICD-10-CM

## 2020-03-28 DIAGNOSIS — C3410 Malignant neoplasm of upper lobe, unspecified bronchus or lung: Secondary | ICD-10-CM

## 2020-03-28 DIAGNOSIS — I4819 Other persistent atrial fibrillation: Secondary | ICD-10-CM | POA: Diagnosis not present

## 2020-03-28 NOTE — Progress Notes (Signed)
Cardiology Office Note:    Date:  03/28/2020   ID:  Joe Cole, DOB 10/27/1950, MRN [id number]  PCP:  [name], MD  [facility] Cardiologist:  Candee Furbish, MD  [facility] Electrophysiologist:  None   Referring MD: [name], MD     History of Present Illness:    Joe Cole is a 69 y.o. male here for the follow-up of permanent atrial fibrillation. Has COPD emphysema, tobacco use.  Prior cardioversion 01/12/2019 then had return of atrial fibrillation in 2 days.  Only felt minimal improvement with cardioversion.  He has been under good control with longstanding persistent at this point.  Overall, he does feel shortness of breath with activity mostly from his COPD.  His lung cancer therapy has been stable.  Notes reviewed from Dr. Earlie Server.  No bleeding.  See below for details.  No fevers chills nausea vomiting syncope bleeding.  Past Medical History:  Diagnosis Date   Arthritis    Atrial fibrillation ([place]) 10/2018   COPD with emphysema ([place])    Dyspnea    Hypertension    lung ca dx'd 10/2018    Past Surgical History:  Procedure Laterality Date   CARDIOVERSION N/A 01/11/2019   Procedure: CARDIOVERSION;  Surgeon: Josue Hector, MD;  Location: [facility] ENDOSCOPY;  Service: Cardiovascular;  Laterality: N/A;   KNEE ARTHROSCOPY     BIL    TOE SURGERY     PINNED LEFT BIG TOE   TONSILLECTOMY     T+A  AS CHILD   TOTAL KNEE ARTHROPLASTY Left 08/19/2017   Procedure: TOTAL KNEE ARTHROPLASTY;  Surgeon: Renette Butters, MD;  Location: [place];  Service: Orthopedics;  Laterality: Left;   VIDEO BRONCHOSCOPY Bilateral 11/05/2018   Procedure: VIDEO BRONCHOSCOPY WITH FLUORO;  Surgeon: [name], MD;  Location: [place];  Service: Cardiopulmonary;  Laterality: Bilateral;   VIDEO BRONCHOSCOPY Bilateral 11/09/2018   Procedure: VIDEO BRONCHOSCOPY WITH FLUORO;  Surgeon: Rigoberto Noel, MD;  Location: [place];  Service: Cardiopulmonary;  Laterality:  Bilateral;    Current Medications: Current Meds  Medication Sig   albuterol (PROAIR HFA) 108 (90 Base) MCG/ACT inhaler Inhale 2 puffs into the lungs every 6 (six) hours as needed for wheezing or shortness of breath.   atorvastatin (LIPITOR) 20 MG tablet Take 20 mg by mouth daily.   diltiazem (CARDIZEM CD) 360 MG 24 hr capsule Take 1 capsule (360 mg total) by mouth daily.   ELIQUIS 5 MG TABS tablet TAKE ONE TABLET BY MOUTH TWICE DAILY   levothyroxine (SYNTHROID) 75 MCG tablet Take 1 tablet (75 mcg total) by mouth daily before breakfast.   metoprolol succinate (TOPROL XL) 50 MG 24 hr tablet Take 1 tablet (50 mg total) by mouth at bedtime. Take with or immediately following a meal.   metoprolol succinate (TOPROL-XL) 25 MG 24 hr tablet TAKE 1 TABLET BY MOUTH EVERYDAY AT BEDTIME   prochlorperazine (COMPAZINE) 10 MG tablet Take 1 tablet (10 mg total) by mouth every 6 (six) hours as needed for nausea or vomiting.   traZODone (DESYREL) 50 MG tablet TAKE 1 TABLET BY MOUTH EVERYDAY AT BEDTIME     Allergies:   Patient has no known allergies.   Social History   Socioeconomic History   Marital status: Married    Spouse name: Not on file   Number of children: Not on file   Years of education: Not on file   Highest education level: Not on file  Occupational History   Not  on file  Tobacco Use   Smoking status: Former Smoker    Packs/day: 1.50    Years: 46.00    Pack years: 69.00    Types: Cigarettes    Quit date: 09/08/2018    Years since quitting: 1.5   Smokeless tobacco: Never Used  Vaping Use   Vaping Use: Never used  Substance and Sexual Activity   Alcohol use: Yes    Alcohol/week: 15.0 - 16.0 standard drinks    Types: 1 - 2 Cans of beer, 12 Shots of liquor, 2 Standard drinks or equivalent per week    Comment: FEW DRINKS AFTER WORK   Drug use: Never   Sexual activity: Not on file  Other Topics Concern   Not on file  Social History Narrative   Not on file    Social Determinants of Health   Financial Resource Strain:    Difficulty of Paying Living Expenses: Not on file  Food Insecurity:    Worried About Cherry in the Last Year: Not on file   Ran Out of Food in the Last Year: Not on file  Transportation Needs:    Lack of Transportation (Medical): Not on file   Lack of Transportation (Non-Medical): Not on file  Physical Activity:    Days of Exercise per Week: Not on file   Minutes of Exercise per Session: Not on file  Stress:    Feeling of Stress : Not on file  Social Connections:    Frequency of Communication with Friends and Family: Not on file   Frequency of Social Gatherings with Friends and Family: Not on file   Attends Religious Services: Not on file   Active Member of Clubs or Organizations: Not on file   Attends Archivist Meetings: Not on file   Marital Status: Not on file     Family History: The patient's family history includes Atrial fibrillation in his mother; Emphysema in his father; Pneumonia in his father.  ROS:   Please see the history of present illness.     All other systems reviewed and are negative.  EKGs/Labs/Other Studies Reviewed:    The following studies were reviewed today:  Echo 11/06/18 demonstrated  1. The left ventricle has low normal systolic function, with an ejection fraction of 50-55%. The cavity size was mildly dilated. Left ventricular diastolic Doppler parameters are indeterminate. 2. Extremely poor acoustic windows limit study. 3. The right ventricle was not well visualized. The cavity was moderately enlarged. There is not assessed. 4. RV is not well seen RVEF is at least mildly depressed. 5. Left atrial size was severely dilated. 6. Right atrial size was severely dilated. 7. The mitral valve is abnormal. Mild thickening of the mitral valve leaflet. There is mild mitral annular calcification present. 8. The tricuspid valve is grossly normal. 9.  The aortic valve is abnormal. Mild thickening of the aortic valve. Mild calcification of the aortic valve. Aortic valve regurgitation is trivial by color flow Doppler. 10. The inferior vena cava was dilated in size with <50% respiratory variability.   EKG: Previous EKG showed atrial fibrillation rate controlled.  Recent Labs: 02/22/2020: ALT 23; BUN 13; Creatinine 0.86; Hemoglobin 13.7; Platelet Count 191; Potassium 4.5; Sodium 134; TSH 16.033  Recent Lipid Panel No results found for: CHOL, TRIG, HDL, CHOLHDL, VLDL, LDLCALC, LDLDIRECT   Physical Exam:    VS:  BP 100/60    Pulse 85    Ht 6\' 1"  (1.854 m)    Wt  194 lb 3.2 oz (88.1 kg)    SpO2 96%    BMI 25.62 kg/m     Wt Readings from Last 3 Encounters:  03/28/20 194 lb 3.2 oz (88.1 kg)  03/13/20 191 lb 12.8 oz (87 kg)  02/22/20 194 lb 1.6 oz (88 kg)     GEN:  Well nourished, well developed in no acute distress HEENT: Normal NECK: No JVD; No carotid bruits LYMPHATICS: No lymphadenopathy CARDIAC: irreg, no murmurs, rubs, gallops RESPIRATORY: Subtle wheezes heard today on exam. ABDOMEN: Soft, non-tender, non-distended MUSCULOSKELETAL:  No edema; No deformity  SKIN: Warm and dry NEUROLOGIC:  Alert and oriented x 3 PSYCHIATRIC:  Normal affect   ASSESSMENT:    1. Persistent atrial fibrillation ([place])   2. Essential hypertension   3. Malignant neoplasm of upper lobe of lung, unspecified laterality ([place])    PLAN:    In order of problems listed above:  Longstanding persistent atrial fibrillation -Tried cardioversion but failed.  2020.  Has severe biatrial enlargement.  Minimal improvement clinically after cardioversion. -Continue with rate control. -Continue with Eliquis diltiazem and Toprol.  Chronic anticoagulation -CHA2DS2-VASc 2.  Continue with Eliquis.  Remember, wife's main concern was possibility of stroke.  His father had stroke on aspirin with atrial fibrillation.  Continue to monitor hemoglobin and  creatinine.  COPD/lung cancer, stage III squamous cell carcinoma -Dr. Lew Dawes notes have been reviewed.  Currently stable. Finished immunotherapy. Stable.  --SOB mostly from COPD.  Subtle wheezes heard today on exam.  Essential hypertension -Well controlled.  No changes made.  LDL 87 hemoglobin 13.7 creatinine 0.86 ALT 23  Medication Adjustments/Labs and Tests Ordered: Current medicines are reviewed at length with the patient today.  Concerns regarding medicines are outlined above.  No orders of the defined types were placed in this encounter.  No orders of the defined types were placed in this encounter.   Patient Instructions  Medication Instructions:  The current medical regimen is effective;  continue present plan and medications.  *If you need a refill on your cardiac medications before your next appointment, please call your pharmacy*  Follow-Up: At [facility], you and your health needs are our priority.  As part of our continuing mission to provide you with exceptional heart care, we have created designated Provider Care Teams.  These Care Teams include your primary Cardiologist (physician) and Advanced Practice Providers (APPs -  Physician Assistants and Nurse Practitioners) who all work together to provide you with the care you need, when you need it.  We recommend signing up for the patient portal called "MyChart".  Sign up information is provided on this After Visit Summary.  MyChart is used to connect with patients for Virtual Visits (Telemedicine).  Patients are able to view lab/test results, encounter notes, upcoming appointments, etc.  Non-urgent messages can be sent to your provider as well.   To learn more about what you can do with MyChart, go to [email or web address].    Your next appointment:   6 month(s)  The format for your next appointment:   In Person  Provider:   Candee Furbish, MD  Thank you for choosing [facility]!!         Signed, Candee Furbish, MD  03/28/2020 12:05 PM    [place]

## 2020-03-28 NOTE — Patient Instructions (Signed)

## 2020-04-12 ENCOUNTER — Telehealth: Payer: Self-pay | Admitting: Pulmonary Disease

## 2020-04-12 ENCOUNTER — Other Ambulatory Visit: Payer: Self-pay

## 2020-04-12 DIAGNOSIS — E785 Hyperlipidemia, unspecified: Secondary | ICD-10-CM | POA: Diagnosis not present

## 2020-04-12 DIAGNOSIS — E7800 Pure hypercholesterolemia, unspecified: Secondary | ICD-10-CM | POA: Diagnosis not present

## 2020-04-12 DIAGNOSIS — C3491 Malignant neoplasm of unspecified part of right bronchus or lung: Secondary | ICD-10-CM | POA: Diagnosis not present

## 2020-04-12 DIAGNOSIS — M1990 Unspecified osteoarthritis, unspecified site: Secondary | ICD-10-CM | POA: Diagnosis not present

## 2020-04-12 DIAGNOSIS — I10 Essential (primary) hypertension: Secondary | ICD-10-CM | POA: Diagnosis not present

## 2020-04-12 DIAGNOSIS — J449 Chronic obstructive pulmonary disease, unspecified: Secondary | ICD-10-CM | POA: Diagnosis not present

## 2020-04-12 MED ORDER — DILTIAZEM HCL ER COATED BEADS 360 MG PO CP24
360.0000 mg | ORAL_CAPSULE | Freq: Every day | ORAL | 3 refills | Status: DC
Start: 2020-04-12 — End: 2021-04-06

## 2020-04-12 NOTE — Telephone Encounter (Signed)
Pharmacy called and stated that patient needs refill on her Trazodone.  Dr. Halford Chessman please advise, if you are ok with refilling please sign order at the bottom

## 2020-04-13 NOTE — Telephone Encounter (Signed)
I am not managing his insomnia.  Request needs to go to his PCP.

## 2020-04-13 NOTE — Telephone Encounter (Signed)
Called and left detailed message for the pharmacy to send request to PCP. Will close encounter.

## 2020-05-12 DIAGNOSIS — M1990 Unspecified osteoarthritis, unspecified site: Secondary | ICD-10-CM | POA: Diagnosis not present

## 2020-05-12 DIAGNOSIS — C3491 Malignant neoplasm of unspecified part of right bronchus or lung: Secondary | ICD-10-CM | POA: Diagnosis not present

## 2020-05-12 DIAGNOSIS — E7800 Pure hypercholesterolemia, unspecified: Secondary | ICD-10-CM | POA: Diagnosis not present

## 2020-05-12 DIAGNOSIS — J449 Chronic obstructive pulmonary disease, unspecified: Secondary | ICD-10-CM | POA: Diagnosis not present

## 2020-05-12 DIAGNOSIS — E785 Hyperlipidemia, unspecified: Secondary | ICD-10-CM | POA: Diagnosis not present

## 2020-05-12 DIAGNOSIS — I10 Essential (primary) hypertension: Secondary | ICD-10-CM | POA: Diagnosis not present

## 2020-06-07 ENCOUNTER — Other Ambulatory Visit: Payer: Self-pay | Admitting: Cardiology

## 2020-06-07 NOTE — Telephone Encounter (Signed)
Prescription refill request for Eliquis received.  Indication: Afib Last office visit: [place], 03/28/2020 Scr: 0.86, 02/22/2020 Age: 70 yo  Weight: 88.1 kg   Prescription refill sent.

## 2020-06-12 ENCOUNTER — Other Ambulatory Visit: Payer: Medicare HMO

## 2020-06-13 ENCOUNTER — Ambulatory Visit (HOSPITAL_COMMUNITY)
Admission: RE | Admit: 2020-06-13 | Discharge: 2020-06-13 | Disposition: A | Discharge status: Home / Self Care | Payer: Medicare HMO | Source: Ambulatory Visit | Attending: Internal Medicine | Admitting: Internal Medicine

## 2020-06-13 ENCOUNTER — Other Ambulatory Visit: Payer: Self-pay

## 2020-06-13 ENCOUNTER — Inpatient Hospital Stay: Payer: Medicare HMO | Attending: Internal Medicine

## 2020-06-13 DIAGNOSIS — C3490 Malignant neoplasm of unspecified part of unspecified bronchus or lung: Secondary | ICD-10-CM | POA: Diagnosis not present

## 2020-06-13 DIAGNOSIS — I4891 Unspecified atrial fibrillation: Secondary | ICD-10-CM | POA: Diagnosis not present

## 2020-06-13 DIAGNOSIS — Z923 Personal history of irradiation: Secondary | ICD-10-CM | POA: Diagnosis not present

## 2020-06-13 DIAGNOSIS — I2510 Atherosclerotic heart disease of native coronary artery without angina pectoris: Secondary | ICD-10-CM | POA: Diagnosis not present

## 2020-06-13 DIAGNOSIS — J449 Chronic obstructive pulmonary disease, unspecified: Secondary | ICD-10-CM | POA: Diagnosis not present

## 2020-06-13 DIAGNOSIS — Z7901 Long term (current) use of anticoagulants: Secondary | ICD-10-CM | POA: Insufficient documentation

## 2020-06-13 DIAGNOSIS — Z9221 Personal history of antineoplastic chemotherapy: Secondary | ICD-10-CM | POA: Diagnosis not present

## 2020-06-13 DIAGNOSIS — Z85118 Personal history of other malignant neoplasm of bronchus and lung: Secondary | ICD-10-CM | POA: Insufficient documentation

## 2020-06-13 DIAGNOSIS — I358 Other nonrheumatic aortic valve disorders: Secondary | ICD-10-CM | POA: Diagnosis not present

## 2020-06-13 DIAGNOSIS — J90 Pleural effusion, not elsewhere classified: Secondary | ICD-10-CM | POA: Diagnosis not present

## 2020-06-13 LAB — CMP (CANCER CENTER ONLY)
ALT: 41 U/L (ref 0–44)
AST: 45 U/L — ABNORMAL HIGH (ref 15–41)
Albumin: 3.8 g/dL (ref 3.5–5.0)
Alkaline Phosphatase: 82 U/L (ref 38–126)
Anion gap: 8 (ref 5–15)
BUN: 16 mg/dL (ref 8–23)
CO2: 23 mmol/L (ref 22–32)
Calcium: 9.4 mg/dL (ref 8.9–10.3)
Chloride: 102 mmol/L (ref 98–111)
Creatinine: 0.94 mg/dL (ref 0.61–1.24)
GFR, Estimated: >60 mL/min (ref >60)
Glucose, Bld: 102 mg/dL — ABNORMAL HIGH (ref 70–99)
Potassium: 5 mmol/L (ref 3.5–5.1)
Sodium: 133 mmol/L — ABNORMAL LOW (ref 135–145)
Total Bilirubin: 0.8 mg/dL (ref 0.3–1.2)
Total Protein: 7 g/dL (ref 6.5–8.1)

## 2020-06-13 LAB — CBC WITH DIFFERENTIAL (CANCER CENTER ONLY)
Abs Immature Granulocytes: 0.02 10*3/uL (ref 0.00–0.07)
Basophils Absolute: 0.1 10*3/uL (ref 0.0–0.1)
Basophils Relative: 2 %
Eosinophils Absolute: 0.1 10*3/uL (ref 0.0–0.5)
Eosinophils Relative: 2 %
HCT: 40.1 % (ref 39.0–52.0)
Hemoglobin: 14.5 g/dL (ref 13.0–17.0)
Immature Granulocytes: 0 %
Lymphocytes Relative: 29 %
Lymphs Abs: 1.4 10*3/uL (ref 0.7–4.0)
MCH: 42.4 pg — ABNORMAL HIGH (ref 26.0–34.0)
MCHC: 36.2 g/dL — ABNORMAL HIGH (ref 30.0–36.0)
MCV: 117.3 fL — ABNORMAL HIGH (ref 80.0–100.0)
Monocytes Absolute: 0.6 10*3/uL (ref 0.1–1.0)
Monocytes Relative: 12 %
Neutro Abs: 2.8 10*3/uL (ref 1.7–7.7)
Neutrophils Relative %: 55 %
Platelet Count: 184 10*3/uL (ref 150–400)
RBC: 3.42 MIL/uL — ABNORMAL LOW (ref 4.22–5.81)
RDW: 13.7 % (ref 11.5–15.5)
WBC Count: 5 10*3/uL (ref 4.0–10.5)
nRBC: 0.4 % — ABNORMAL HIGH (ref 0.0–0.2)

## 2020-06-13 MED ORDER — IOHEXOL 300 MG/ML  SOLN
75.0000 mL | Freq: Once | INTRAMUSCULAR | Status: AC | PRN
  Administered 2020-06-13: 75 mL via INTRAVENOUS

## 2020-06-14 ENCOUNTER — Other Ambulatory Visit: Payer: Self-pay

## 2020-06-14 ENCOUNTER — Inpatient Hospital Stay: Payer: Medicare HMO | Admitting: Internal Medicine

## 2020-06-14 ENCOUNTER — Encounter: Payer: Self-pay | Admitting: Internal Medicine

## 2020-06-14 VITALS — BP 122/76 | HR 89 | Temp 97.8°F | Resp 13 | Ht 73.0 in | Wt 190.4 lb

## 2020-06-14 DIAGNOSIS — Z85118 Personal history of other malignant neoplasm of bronchus and lung: Secondary | ICD-10-CM | POA: Diagnosis not present

## 2020-06-14 DIAGNOSIS — C3490 Malignant neoplasm of unspecified part of unspecified bronchus or lung: Secondary | ICD-10-CM | POA: Diagnosis not present

## 2020-06-14 DIAGNOSIS — C3491 Malignant neoplasm of unspecified part of right bronchus or lung: Secondary | ICD-10-CM | POA: Diagnosis not present

## 2020-06-14 DIAGNOSIS — I4891 Unspecified atrial fibrillation: Secondary | ICD-10-CM | POA: Diagnosis not present

## 2020-06-14 DIAGNOSIS — Z9221 Personal history of antineoplastic chemotherapy: Secondary | ICD-10-CM | POA: Diagnosis not present

## 2020-06-14 DIAGNOSIS — Z923 Personal history of irradiation: Secondary | ICD-10-CM | POA: Diagnosis not present

## 2020-06-14 DIAGNOSIS — J449 Chronic obstructive pulmonary disease, unspecified: Secondary | ICD-10-CM | POA: Diagnosis not present

## 2020-06-14 DIAGNOSIS — Z7901 Long term (current) use of anticoagulants: Secondary | ICD-10-CM | POA: Diagnosis not present

## 2020-06-14 NOTE — Progress Notes (Signed)
[facility] [phone]   [phone]  OFFICE PROGRESS NOTE  [name], MD [place] [address]  DIAGNOSIS: Stage IIIA (T3, N1, M0) non-small cell lung cancer, squamous cell carcinoma diagnosed in July 2020 and presented with right middle lobe mass and right hilar adenopathy with postobstructive pneumonia and suspicious right pleural effusion.  PRIOR THERAPY:   1) Weekly concurrent chemoradiation with Carboplatin for an AUC of 2 and paclitaxel 45 mg/m2. First dose 11/23/2018. Status post 7 cycles.  2) Consolidation immunotherapy with Imfinzi 10 mg/KG every 2 weeks.  First dose February 08, 2019.  Status post 26 cycles.  CURRENT THERAPY: Observation.  INTERVAL HISTORY: Joe Cole 70 y.o. male returns to the clinic today for follow-up visit accompanied by his wife.  The patient is feeling fine today with no concerning complaints.  He denied having any chest pain but continues to have the baseline shortness of breath secondary to COPD and atrial fibrillation.  He denied having any cough or hemoptysis.  He has no nausea, vomiting, diarrhea or constipation.  He denied having any headache or visual changes.  He is currently on observation.  He had repeat CT scan of the chest performed yesterday and is here for evaluation and discussion of his discuss results.   MEDICAL HISTORY: Past Medical History:  Diagnosis Date  . Arthritis   . Atrial fibrillation ([place]) 10/2018  . COPD with emphysema ([place])   . Dyspnea   . Hypertension   . lung ca dx'd 10/2018    ALLERGIES:  has No Known Allergies.  MEDICATIONS:  Current Outpatient Medications  Medication Sig Dispense Refill  . albuterol (PROAIR HFA) 108 (90 Base) MCG/ACT inhaler Inhale 2 puffs into the lungs every 6 (six) hours as needed for wheezing or shortness of breath. 1 Inhaler 5  . atorvastatin (LIPITOR) 20 MG tablet Take 20 mg by mouth daily.    Marland Kitchen diltiazem  (CARDIZEM CD) 360 MG 24 hr capsule Take 1 capsule (360 mg total) by mouth daily. 90 capsule 3  . ELIQUIS 5 MG TABS tablet TAKE ONE TABLET BY MOUTH TWICE DAILY 60 tablet 5  . levothyroxine (SYNTHROID) 75 MCG tablet Take 1 tablet (75 mcg total) by mouth daily before breakfast. 30 tablet 1  . metoprolol succinate (TOPROL XL) 50 MG 24 hr tablet Take 1 tablet (50 mg total) by mouth at bedtime. Take with or immediately following a meal. 30 tablet 11  . metoprolol succinate (TOPROL-XL) 25 MG 24 hr tablet TAKE 1 TABLET BY MOUTH EVERYDAY AT BEDTIME 90 tablet 3  . prochlorperazine (COMPAZINE) 10 MG tablet Take 1 tablet (10 mg total) by mouth every 6 (six) hours as needed for nausea or vomiting. 30 tablet 0  . traZODone (DESYREL) 50 MG tablet TAKE 1 TABLET BY MOUTH EVERYDAY AT BEDTIME 90 tablet 1   No current facility-administered medications for this visit.    SURGICAL HISTORY:  Past Surgical History:  Procedure Laterality Date  . CARDIOVERSION N/A 01/11/2019   Procedure: CARDIOVERSION;  Surgeon: Josue Hector, MD;  Location: [facility] ENDOSCOPY;  Service: Cardiovascular;  Laterality: N/A;  . KNEE ARTHROSCOPY     BIL   . TOE SURGERY     PINNED LEFT BIG TOE  . TONSILLECTOMY     T+A  AS CHILD  . TOTAL KNEE ARTHROPLASTY Left 08/19/2017   Procedure: TOTAL KNEE ARTHROPLASTY;  Surgeon: Renette Butters, MD;  Location: [facility];  Service:  Orthopedics;  Laterality: Left;  Marland Kitchen VIDEO BRONCHOSCOPY Bilateral 11/05/2018   Procedure: VIDEO BRONCHOSCOPY WITH FLUORO;  Surgeon: [name], MD;  Location: [facility] ENDOSCOPY;  Service: Cardiopulmonary;  Laterality: Bilateral;  . VIDEO BRONCHOSCOPY Bilateral 11/09/2018   Procedure: VIDEO BRONCHOSCOPY WITH FLUORO;  Surgeon: Rigoberto Noel, MD;  Location: [facility];  Service: Cardiopulmonary;  Laterality: Bilateral;    REVIEW OF SYSTEMS:  A comprehensive review of systems was negative except for: Respiratory: positive for dyspnea on exertion   PHYSICAL EXAMINATION: General  appearance: alert, cooperative and no distress Head: Normocephalic, without obvious abnormality, atraumatic Neck: no adenopathy, no JVD, supple, symmetrical, trachea midline and thyroid not enlarged, symmetric, no tenderness/mass/nodules Lymph nodes: Cervical, supraclavicular, and axillary nodes normal. Resp: clear to auscultation bilaterally Back: symmetric, no curvature. ROM normal. No CVA tenderness. Cardio: regular rate and rhythm, S1, S2 normal, no murmur, click, rub or gallop GI: soft, non-tender; bowel sounds normal; no masses,  no organomegaly Extremities: extremities normal, atraumatic, no cyanosis or edema  ECOG PERFORMANCE STATUS: 1 - Symptomatic but completely ambulatory  Blood pressure 122/76, pulse 89, temperature 97.8 F (36.6 C), temperature source Tympanic, resp. rate 13, height 6\' 1"  (1.854 m), weight 190 lb 6.4 oz (86.4 kg), SpO2 100 %.  LABORATORY DATA: Lab Results  Component Value Date   WBC 5.0 06/13/2020   HGB 14.5 06/13/2020   HCT 40.1 06/13/2020   MCV 117.3 (H) 06/13/2020   PLT 184 06/13/2020      Chemistry      Component Value Date/Time   NA 133 (L) 06/13/2020 1045   K 5.0 06/13/2020 1045   CL 102 06/13/2020 1045   CO2 23 06/13/2020 1045   BUN 16 06/13/2020 1045   CREATININE 0.94 06/13/2020 1045      Component Value Date/Time   CALCIUM 9.4 06/13/2020 1045   ALKPHOS 82 06/13/2020 1045   AST 45 (H) 06/13/2020 1045   ALT 41 06/13/2020 1045   BILITOT 0.8 06/13/2020 1045       RADIOGRAPHIC STUDIES: No results found.  ASSESSMENT AND PLAN: This is a very pleasant 70 years old white male with stage IIIA non-small cell lung cancer, squamous cell carcinoma.  The patient underwent a course of concurrent chemoradiation with weekly carboplatin and paclitaxel status post 7 cycles.  The patient tolerated his treatment well except for mild fatigue and odynophagia. The patient completed a course of consolidation treatment with immunotherapy with Imfinzi every  2 weeks status post 26 cycles.  He tolerated his treatment well with no concerning adverse effects. The patient is currently on observation and he is feeling fine today with no concerning complaints except for the baseline shortness of breath secondary to COPD and atrial fibrillation. He had repeat CT scan of the chest performed yesterday.  I personally and independently reviewed the scan images in comparison to the previous scan and I did not see any significant difference but the final report is still pending. I recommended for the patient to continue on observation with repeat CT scan of the chest in 4 months unless the final report showed any concerning findings, I will call him sooner with recommendation. For the history of atrial fibrillation, he will continue his current treatment with Eliquis. The patient was advised to call immediately if he has any other concerning symptoms in the interval. The patient voices understanding of current disease status and treatment options and is in agreement with the current care plan. All questions were answered. The patient knows to call the  clinic with any problems, questions or concerns. We can certainly see the patient much sooner if necessary.  Disclaimer: This note was dictated with voice recognition software. Similar sounding words can inadvertently be transcribed and may not be corrected upon review.

## 2020-06-15 DIAGNOSIS — E785 Hyperlipidemia, unspecified: Secondary | ICD-10-CM | POA: Diagnosis not present

## 2020-06-15 DIAGNOSIS — E7800 Pure hypercholesterolemia, unspecified: Secondary | ICD-10-CM | POA: Diagnosis not present

## 2020-06-15 DIAGNOSIS — C3491 Malignant neoplasm of unspecified part of right bronchus or lung: Secondary | ICD-10-CM | POA: Diagnosis not present

## 2020-06-15 DIAGNOSIS — J449 Chronic obstructive pulmonary disease, unspecified: Secondary | ICD-10-CM | POA: Diagnosis not present

## 2020-06-15 DIAGNOSIS — I10 Essential (primary) hypertension: Secondary | ICD-10-CM | POA: Diagnosis not present

## 2020-06-15 DIAGNOSIS — M1990 Unspecified osteoarthritis, unspecified site: Secondary | ICD-10-CM | POA: Diagnosis not present

## 2020-06-23 ENCOUNTER — Ambulatory Visit: Payer: Medicare HMO | Admitting: Pulmonary Disease

## 2020-07-20 DIAGNOSIS — M1990 Unspecified osteoarthritis, unspecified site: Secondary | ICD-10-CM | POA: Diagnosis not present

## 2020-07-20 DIAGNOSIS — J449 Chronic obstructive pulmonary disease, unspecified: Secondary | ICD-10-CM | POA: Diagnosis not present

## 2020-07-20 DIAGNOSIS — I10 Essential (primary) hypertension: Secondary | ICD-10-CM | POA: Diagnosis not present

## 2020-07-20 DIAGNOSIS — E785 Hyperlipidemia, unspecified: Secondary | ICD-10-CM | POA: Diagnosis not present

## 2020-07-20 DIAGNOSIS — E7800 Pure hypercholesterolemia, unspecified: Secondary | ICD-10-CM | POA: Diagnosis not present

## 2020-07-31 ENCOUNTER — Ambulatory Visit: Payer: Medicare HMO | Admitting: Pulmonary Disease

## 2020-07-31 ENCOUNTER — Encounter: Payer: Self-pay | Admitting: Pulmonary Disease

## 2020-07-31 ENCOUNTER — Other Ambulatory Visit: Payer: Self-pay

## 2020-07-31 VITALS — BP 126/70 | HR 53 | Temp 97.6°F | Ht 73.0 in | Wt 190.8 lb

## 2020-07-31 DIAGNOSIS — J432 Centrilobular emphysema: Secondary | ICD-10-CM | POA: Diagnosis not present

## 2020-07-31 DIAGNOSIS — R058 Other specified cough: Secondary | ICD-10-CM

## 2020-07-31 NOTE — Patient Instructions (Signed)
Can try using saline nasal spray and flonase as needed to help with sinus congestion, sneezing, and cough  Follow up in 1 year

## 2020-07-31 NOTE — Progress Notes (Signed)
[facility]  Chief Complaint  Patient presents with  . Follow-up    Productive cough with clear phlegm, shortness of breath with activity    Constitutional:  BP 126/70 (BP Location: Left Arm, Cuff Size: Normal)   Pulse (!) 53   Temp 97.6 F (36.4 C) (Temporal)   Ht 6\' 1"  (1.854 m)   Wt 190 lb 12.8 oz (86.5 kg)   SpO2 98% Comment: Room air  BMI 25.17 kg/m   Past Medical History:  Arthritis, Hypertension, NSCLC cancer dx July 2020, A fib  Past Surgical History:  He  has a past surgical history that includes Knee arthroscopy; Toe Surgery; Tonsillectomy; Total knee arthroplasty (Left, 08/19/2017); Video bronchoscopy (Bilateral, 11/05/2018); Video bronchoscopy (Bilateral, 11/09/2018); and Cardioversion (N/A, 01/11/2019).  Brief Summary:  Joe Cole is a 70 y.o. male former smoker with COPD/emphysema.       Subjective:   He is here with his wife.  Breathing has been okay.  Didn't feel like anoro helped.  Hasn't needed to use albuterol much.  He gets sniffling and sneezing episodes.  Gets sinus drainage and cough, especially in the morning.  Physical Exam:   Appearance - well kempt   ENMT - no sinus tenderness, no oral exudate, no LAN, Mallampati 3 airway, no stridor  Respiratory - equal breath sounds bilaterally, no wheezing or rales  CV - s1s2 regular rate and rhythm, no murmurs  Ext - no clubbing, no edema  Skin - no rashes  Psych - normal mood and affect   Pulmonary testing:   PFT 05/04/18 >> FEV1 2.35 (64%), FEV1% 59, TLC 9.00 (121%), RV 4.89 (197%), DLCO 60%  A1AT level 05/04/18 >> 167, MM  Chest Imaging:   CT angio chest 10/20/18 >> Rt hilar LAN 2.2 cm, centrilobular and paraseptal emphysema, mass Rt perihilar region  PET scan 05/04/24 >> hypermetabolic Rt hila with postobstructive collapse of RML, nodule RML, small Rt effusion  CT chest 10/15/19 >> small Rt effusion, moderate centrilobular and paraseptal  emphysema, changes of XRT Rt perihilar area  Cardiac Tests:   Echo 11/06/18 >> EF 50 to 55%, severe LA/RA dilation  Social History:  He  reports that he quit smoking about 22 months ago. His smoking use included cigarettes. He has a 69.00 pack-year smoking history. He has never used smokeless tobacco. He reports current alcohol use of about 15.0 - 16.0 standard drinks of alcohol per week. He reports that he does not use drugs.  Family History:  His family history includes Atrial fibrillation in his mother; Emphysema in his father; Pneumonia in his father.     Assessment/Plan:   COPD with emphysema. - anoro was ineffective - continue prn albuterol  Upper airway cough syndrome. - he can try nasal irrigation and flonase as needed  Stage 3 NSCLC (squamous cell). - s/p chemotherapy with carboplatin, paclitaxel - started consolidation immunotherapy with imfinzi 02/08/19 - followed by Dr. Curt Bears with [facility]  Persistent A fib. - followed by Dr. Candee Furbish with [facility]  Time Spent Involved in Patient Care on Day of Examination:  22 minutes  Follow up:  Patient Instructions  Can try using saline nasal spray and flonase as needed to help with sinus congestion, sneezing, and cough  Follow up in 1 year   Medication List:   Allergies as of 07/31/2020   No Known Allergies     Medication List       Accurate as  of July 31, 2020 12:55 PM. If you have any questions, ask your nurse or doctor.        albuterol 108 (90 Base) MCG/ACT inhaler Commonly known as: ProAir HFA Inhale 2 puffs into the lungs every 6 (six) hours as needed for wheezing or shortness of breath.   atorvastatin 20 MG tablet Commonly known as: LIPITOR Take 20 mg by mouth daily.   diltiazem 360 MG 24 hr capsule Commonly known as: CARDIZEM CD Take 1 capsule (360 mg total) by mouth daily.   Eliquis 5 MG Tabs tablet Generic drug: apixaban TAKE ONE TABLET BY MOUTH TWICE DAILY    levothyroxine 75 MCG tablet Commonly known as: SYNTHROID Take 1 tablet (75 mcg total) by mouth daily before breakfast.   metoprolol succinate 50 MG 24 hr tablet Commonly known as: Toprol XL Take 1 tablet (50 mg total) by mouth at bedtime. Take with or immediately following a meal.   metoprolol succinate 25 MG 24 hr tablet Commonly known as: TOPROL-XL TAKE 1 TABLET BY MOUTH EVERYDAY AT BEDTIME   prochlorperazine 10 MG tablet Commonly known as: COMPAZINE Take 1 tablet (10 mg total) by mouth every 6 (six) hours as needed for nausea or vomiting.   traZODone 50 MG tablet Commonly known as: DESYREL TAKE 1 TABLET BY MOUTH EVERYDAY AT BEDTIME       Signature:  [name], MD [place] Pager - [phone] 07/31/2020, 12:55 PM

## 2020-08-10 DIAGNOSIS — M1990 Unspecified osteoarthritis, unspecified site: Secondary | ICD-10-CM | POA: Diagnosis not present

## 2020-08-10 DIAGNOSIS — E785 Hyperlipidemia, unspecified: Secondary | ICD-10-CM | POA: Diagnosis not present

## 2020-08-10 DIAGNOSIS — C3491 Malignant neoplasm of unspecified part of right bronchus or lung: Secondary | ICD-10-CM | POA: Diagnosis not present

## 2020-08-10 DIAGNOSIS — I10 Essential (primary) hypertension: Secondary | ICD-10-CM | POA: Diagnosis not present

## 2020-08-10 DIAGNOSIS — E7800 Pure hypercholesterolemia, unspecified: Secondary | ICD-10-CM | POA: Diagnosis not present

## 2020-08-10 DIAGNOSIS — J449 Chronic obstructive pulmonary disease, unspecified: Secondary | ICD-10-CM | POA: Diagnosis not present

## 2020-09-14 DIAGNOSIS — E785 Hyperlipidemia, unspecified: Secondary | ICD-10-CM | POA: Diagnosis not present

## 2020-09-14 DIAGNOSIS — I10 Essential (primary) hypertension: Secondary | ICD-10-CM | POA: Diagnosis not present

## 2020-09-14 DIAGNOSIS — M1990 Unspecified osteoarthritis, unspecified site: Secondary | ICD-10-CM | POA: Diagnosis not present

## 2020-09-14 DIAGNOSIS — E7800 Pure hypercholesterolemia, unspecified: Secondary | ICD-10-CM | POA: Diagnosis not present

## 2020-09-14 DIAGNOSIS — C3491 Malignant neoplasm of unspecified part of right bronchus or lung: Secondary | ICD-10-CM | POA: Diagnosis not present

## 2020-09-14 DIAGNOSIS — J449 Chronic obstructive pulmonary disease, unspecified: Secondary | ICD-10-CM | POA: Diagnosis not present

## 2020-10-10 DIAGNOSIS — E7800 Pure hypercholesterolemia, unspecified: Secondary | ICD-10-CM | POA: Diagnosis not present

## 2020-10-10 DIAGNOSIS — E785 Hyperlipidemia, unspecified: Secondary | ICD-10-CM | POA: Diagnosis not present

## 2020-10-10 DIAGNOSIS — J449 Chronic obstructive pulmonary disease, unspecified: Secondary | ICD-10-CM | POA: Diagnosis not present

## 2020-10-10 DIAGNOSIS — I10 Essential (primary) hypertension: Secondary | ICD-10-CM | POA: Diagnosis not present

## 2020-10-10 DIAGNOSIS — M1990 Unspecified osteoarthritis, unspecified site: Secondary | ICD-10-CM | POA: Diagnosis not present

## 2020-10-10 DIAGNOSIS — E039 Hypothyroidism, unspecified: Secondary | ICD-10-CM | POA: Diagnosis not present

## 2020-10-16 ENCOUNTER — Ambulatory Visit (HOSPITAL_COMMUNITY)
Admission: RE | Admit: 2020-10-16 | Discharge: 2020-10-16 | Disposition: A | Discharge status: Home / Self Care | Payer: Medicare HMO | Source: Ambulatory Visit | Attending: Internal Medicine | Admitting: Internal Medicine

## 2020-10-16 ENCOUNTER — Encounter (HOSPITAL_COMMUNITY): Payer: Self-pay

## 2020-10-16 ENCOUNTER — Inpatient Hospital Stay: Payer: Medicare HMO | Attending: Internal Medicine

## 2020-10-16 ENCOUNTER — Other Ambulatory Visit: Payer: Self-pay

## 2020-10-16 DIAGNOSIS — C3490 Malignant neoplasm of unspecified part of unspecified bronchus or lung: Secondary | ICD-10-CM

## 2020-10-16 DIAGNOSIS — S2241XA Multiple fractures of ribs, right side, initial encounter for closed fracture: Secondary | ICD-10-CM | POA: Diagnosis not present

## 2020-10-16 DIAGNOSIS — I4891 Unspecified atrial fibrillation: Secondary | ICD-10-CM | POA: Diagnosis not present

## 2020-10-16 DIAGNOSIS — J90 Pleural effusion, not elsewhere classified: Secondary | ICD-10-CM | POA: Diagnosis not present

## 2020-10-16 DIAGNOSIS — C342 Malignant neoplasm of middle lobe, bronchus or lung: Secondary | ICD-10-CM | POA: Diagnosis not present

## 2020-10-16 DIAGNOSIS — Z7901 Long term (current) use of anticoagulants: Secondary | ICD-10-CM | POA: Diagnosis not present

## 2020-10-16 DIAGNOSIS — I2510 Atherosclerotic heart disease of native coronary artery without angina pectoris: Secondary | ICD-10-CM | POA: Diagnosis not present

## 2020-10-16 LAB — CBC WITH DIFFERENTIAL (CANCER CENTER ONLY)
Abs Immature Granulocytes: 0.03 10*3/uL (ref 0.00–0.07)
Basophils Absolute: 0.1 10*3/uL (ref 0.0–0.1)
Basophils Relative: 1 %
Eosinophils Absolute: 0.1 10*3/uL (ref 0.0–0.5)
Eosinophils Relative: 2 %
HCT: 39.1 % (ref 39.0–52.0)
Hemoglobin: 14.3 g/dL (ref 13.0–17.0)
Immature Granulocytes: 1 %
Lymphocytes Relative: 26 %
Lymphs Abs: 1.3 10*3/uL (ref 0.7–4.0)
MCH: 41.4 pg — ABNORMAL HIGH (ref 26.0–34.0)
MCHC: 36.6 g/dL — ABNORMAL HIGH (ref 30.0–36.0)
MCV: 113.3 fL — ABNORMAL HIGH (ref 80.0–100.0)
Monocytes Absolute: 0.5 10*3/uL (ref 0.1–1.0)
Monocytes Relative: 10 %
Neutro Abs: 3 10*3/uL (ref 1.7–7.7)
Neutrophils Relative %: 60 %
Platelet Count: 168 10*3/uL (ref 150–400)
RBC: 3.45 MIL/uL — ABNORMAL LOW (ref 4.22–5.81)
RDW: 13.3 % (ref 11.5–15.5)
WBC Count: 5 10*3/uL (ref 4.0–10.5)
nRBC: 0 % (ref 0.0–0.2)

## 2020-10-16 LAB — CMP (CANCER CENTER ONLY)
ALT: 34 U/L (ref 0–44)
AST: 35 U/L (ref 15–41)
Albumin: 4 g/dL (ref 3.5–5.0)
Alkaline Phosphatase: 91 U/L (ref 38–126)
Anion gap: 7 (ref 5–15)
BUN: 12 mg/dL (ref 8–23)
CO2: 26 mmol/L (ref 22–32)
Calcium: 9 mg/dL (ref 8.9–10.3)
Chloride: 100 mmol/L (ref 98–111)
Creatinine: 0.9 mg/dL (ref 0.61–1.24)
GFR, Estimated: >60 mL/min (ref >60)
Glucose, Bld: 109 mg/dL — ABNORMAL HIGH (ref 70–99)
Potassium: 4.7 mmol/L (ref 3.5–5.1)
Sodium: 133 mmol/L — ABNORMAL LOW (ref 135–145)
Total Bilirubin: 0.7 mg/dL (ref 0.3–1.2)
Total Protein: 7.1 g/dL (ref 6.5–8.1)

## 2020-10-16 MED ORDER — SODIUM CHLORIDE (PF) 0.9 % IJ SOLN
INTRAMUSCULAR | Status: AC
  Filled 2020-10-16: qty 50

## 2020-10-16 MED ORDER — IOHEXOL 300 MG/ML  SOLN
100.0000 mL | Freq: Once | INTRAMUSCULAR | Status: AC | PRN
  Administered 2020-10-16: 75 mL via INTRAVENOUS

## 2020-10-17 ENCOUNTER — Inpatient Hospital Stay: Payer: Medicare HMO | Admitting: Internal Medicine

## 2020-10-17 VITALS — BP 139/80 | HR 76 | Temp 97.3°F | Resp 18 | Wt 190.6 lb

## 2020-10-17 DIAGNOSIS — I4891 Unspecified atrial fibrillation: Secondary | ICD-10-CM | POA: Diagnosis not present

## 2020-10-17 DIAGNOSIS — C3491 Malignant neoplasm of unspecified part of right bronchus or lung: Secondary | ICD-10-CM

## 2020-10-17 DIAGNOSIS — Z7901 Long term (current) use of anticoagulants: Secondary | ICD-10-CM | POA: Diagnosis not present

## 2020-10-17 DIAGNOSIS — C3490 Malignant neoplasm of unspecified part of unspecified bronchus or lung: Secondary | ICD-10-CM

## 2020-10-17 DIAGNOSIS — C342 Malignant neoplasm of middle lobe, bronchus or lung: Secondary | ICD-10-CM | POA: Diagnosis not present

## 2020-10-17 NOTE — Progress Notes (Signed)
[facility] [phone]   [phone]) [phone]  OFFICE PROGRESS NOTE  Joe Frees, MD [place] [address]  DIAGNOSIS: Stage IIIA (T3, N1, M0) non-small cell lung cancer, squamous cell carcinoma diagnosed in July 2020 and presented with right middle lobe mass and right hilar adenopathy with postobstructive pneumonia and suspicious right pleural effusion.   PRIOR THERAPY:   1) Weekly concurrent chemoradiation with Carboplatin for an AUC of 2 and paclitaxel 45 mg/m2. First dose 11/23/2018. Status post 7 cycles.  2) Consolidation immunotherapy with Imfinzi 10 mg/KG every 2 weeks.  First dose February 08, 2019.  Status post 26 cycles.  CURRENT THERAPY: Observation.  INTERVAL HISTORY: Joe Cole 70 y.o. male returns to the clinic today for 33-month follow-up visit accompanied by his wife.  The patient is feeling fine today with no concerning complaints.  He denied having any chest pain, shortness of breath, cough or hemoptysis.  He denied having any fever or chills.  He has no nausea, vomiting, diarrhea or constipation.  He has chronic swelling of the right lower extremity with no pain or erythema.  The patient had repeat CT scan of the chest performed recently and he is here for evaluation and discussion of his scan results.  MEDICAL HISTORY: Past Medical History:  Diagnosis Date   Arthritis    Atrial fibrillation ([place]) 10/2018   COPD with emphysema ([place])    Dyspnea    Hypertension    lung ca dx'd 10/2018    ALLERGIES:  has No Known Allergies.  MEDICATIONS:  Current Outpatient Medications  Medication Sig Dispense Refill   albuterol (PROAIR HFA) 108 (90 Base) MCG/ACT inhaler Inhale 2 puffs into the lungs every 6 (six) hours as needed for wheezing or shortness of breath. 1 Inhaler 5   atorvastatin (LIPITOR) 20 MG tablet Take 20 mg by mouth daily.     diltiazem (CARDIZEM CD) 360 MG 24 hr capsule Take 1 capsule (360 mg  total) by mouth daily. 90 capsule 3   ELIQUIS 5 MG TABS tablet TAKE ONE TABLET BY MOUTH TWICE DAILY 60 tablet 5   levothyroxine (SYNTHROID) 75 MCG tablet Take 1 tablet (75 mcg total) by mouth daily before breakfast. 30 tablet 1   metoprolol succinate (TOPROL XL) 50 MG 24 hr tablet Take 1 tablet (50 mg total) by mouth at bedtime. Take with or immediately following a meal. 30 tablet 11   metoprolol succinate (TOPROL-XL) 25 MG 24 hr tablet TAKE 1 TABLET BY MOUTH EVERYDAY AT BEDTIME 90 tablet 3   prochlorperazine (COMPAZINE) 10 MG tablet Take 1 tablet (10 mg total) by mouth every 6 (six) hours as needed for nausea or vomiting. (Patient not taking: Reported on 07/31/2020) 30 tablet 0   traZODone (DESYREL) 50 MG tablet TAKE 1 TABLET BY MOUTH EVERYDAY AT BEDTIME 90 tablet 1   No current facility-administered medications for this visit.    SURGICAL HISTORY:  Past Surgical History:  Procedure Laterality Date   CARDIOVERSION N/A 01/11/2019   Procedure: CARDIOVERSION;  Surgeon: Josue Hector, MD;  Location: [facility] ENDOSCOPY;  Service: Cardiovascular;  Laterality: N/A;   KNEE ARTHROSCOPY     BIL    TOE SURGERY     PINNED LEFT BIG TOE   TONSILLECTOMY     T+A  AS CHILD   TOTAL KNEE ARTHROPLASTY Left 08/19/2017   Procedure: TOTAL KNEE ARTHROPLASTY;  Surgeon: Renette Butters, MD;  Location: [facility];  Service:  Orthopedics;  Laterality: Left;   VIDEO BRONCHOSCOPY Bilateral 11/05/2018   Procedure: VIDEO BRONCHOSCOPY WITH FLUORO;  Surgeon: [name], MD;  Location: [facility];  Service: Cardiopulmonary;  Laterality: Bilateral;   VIDEO BRONCHOSCOPY Bilateral 11/09/2018   Procedure: VIDEO BRONCHOSCOPY WITH FLUORO;  Surgeon: Rigoberto Noel, MD;  Location: [facility];  Service: Cardiopulmonary;  Laterality: Bilateral;    REVIEW OF SYSTEMS:  Review of systems not obtained due to patient factors.   PHYSICAL EXAMINATION: General appearance: alert, cooperative, and no distress Head: Normocephalic, without obvious  abnormality, atraumatic Neck: no adenopathy, no JVD, supple, symmetrical, trachea midline, and thyroid not enlarged, symmetric, no tenderness/mass/nodules Lymph nodes: Cervical, supraclavicular, and axillary nodes normal. Resp: clear to auscultation bilaterally Back: symmetric, no curvature. ROM normal. No CVA tenderness. Cardio: regular rate and rhythm, S1, S2 normal, no murmur, click, rub or gallop GI: soft, non-tender; bowel sounds normal; no masses,  no organomegaly Extremities: extremities normal, atraumatic, no cyanosis or edema  ECOG PERFORMANCE STATUS: 1 - Symptomatic but completely ambulatory  Blood pressure 139/80, pulse 76, temperature (!) 97.3 F (36.3 C), temperature source Tympanic, resp. rate 18, weight 190 lb 9.6 oz (86.5 kg), SpO2 99 %.  LABORATORY DATA: Lab Results  Component Value Date   WBC 5.0 10/16/2020   HGB 14.3 10/16/2020   HCT 39.1 10/16/2020   MCV 113.3 (H) 10/16/2020   PLT 168 10/16/2020      Chemistry      Component Value Date/Time   NA 133 (L) 10/16/2020 0830   K 4.7 10/16/2020 0830   CL 100 10/16/2020 0830   CO2 26 10/16/2020 0830   BUN 12 10/16/2020 0830   CREATININE 0.90 10/16/2020 0830      Component Value Date/Time   CALCIUM 9.0 10/16/2020 0830   ALKPHOS 91 10/16/2020 0830   AST 35 10/16/2020 0830   ALT 34 10/16/2020 0830   BILITOT 0.7 10/16/2020 0830       RADIOGRAPHIC STUDIES: CT Chest W Contrast  Result Date: 10/16/2020 CLINICAL DATA:  Non-small-cell lung cancer. Completed immunotherapy 4 months ago. COPD. Dyspnea. EXAM: CT CHEST WITH CONTRAST TECHNIQUE: Multidetector CT imaging of the chest was performed during intravenous contrast administration. CONTRAST:  22mL OMNIPAQUE IOHEXOL 300 MG/ML SOLN 75 cc of Omnipaque 300 COMPARISON:  06/03/2020 FINDINGS: Cardiovascular: Advanced aortic and branch vessel atherosclerosis. Normal heart size, without pericardial effusion. Multivessel coronary artery atherosclerosis. Aortic valve  calcification. No central pulmonary embolism, on this non-dedicated study. Mediastinum/Nodes: No supraclavicular adenopathy. No mediastinal or hilar adenopathy. Lungs/Pleura: Small right pleural effusion, increased. No evidence of pleural nodularity. Mild centrilobular emphysema. similar appearance of right perihilar radiation induced fibrosis with architectural distortion and mild traction bronchiectasis. Upper Abdomen: Focal steatosis in the posterior aspect of segment 2. Similar. Normal imaged portions of the spleen, stomach, pancreas, left adrenal gland, kidneys. Right adrenal thickening and nodularity are similar. Musculoskeletal: Advanced left glenohumeral joint degenerative change. Interval but nonacute right posterior twelfth rib fracture including on 175/2. Lower thoracic spondylosis. IMPRESSION: 1. Similar appearance of right perihilar radiation fibrosis, without recurrent or metastatic disease. 2. Increase in small right pleural effusion. 3. Aortic atherosclerosis (ICD10-I70.0), coronary artery atherosclerosis and emphysema (ICD10-J43.9). 4. Aortic valvular calcifications. Consider echocardiography to evaluate for valvular dysfunction. 5. Right twelfth rib interval but nonacute fracture. Correlate with interval trauma. Electronically Signed   By: Abigail Miyamoto M.D.   On: 10/16/2020 13:48    ASSESSMENT AND PLAN: This is a very pleasant 70 years old white male with stage IIIA non-small cell  lung cancer, squamous cell carcinoma.  The patient underwent a course of concurrent chemoradiation with weekly carboplatin and paclitaxel status post 7 cycles.  The patient tolerated his treatment well except for mild fatigue and odynophagia. The patient completed a course of consolidation treatment with immunotherapy with Imfinzi every 2 weeks status post 26 cycles.  He tolerated his treatment well with no concerning adverse effects. The patient is currently on observation and he is feeling fine today with no  concerning complaints. He had repeat CT scan of the chest performed recently.  I personally and independently reviewed the scans and discussed the results with the patient and his wife. His scan showed no concerning findings for disease progression. I recommended for the patient to continue on observation with repeat CT scan of the chest in 6 months. For the history of atrial fibrillation, he will continue his current treatment with Eliquis. The patient was advised to call immediately if he has any concerning symptoms in the interval. The patient voices understanding of current disease status and treatment options and is in agreement with the current care plan. All questions were answered. The patient knows to call the clinic with any problems, questions or concerns. We can certainly see the patient much sooner if necessary.  Disclaimer: This note was dictated with voice recognition software. Similar sounding words can inadvertently be transcribed and may not be corrected upon review.

## 2020-10-25 ENCOUNTER — Telehealth: Payer: Self-pay | Admitting: Internal Medicine

## 2020-10-25 NOTE — Telephone Encounter (Signed)
Sch per 06/22 los, pt aware.

## 2020-10-26 DIAGNOSIS — Z125 Encounter for screening for malignant neoplasm of prostate: Secondary | ICD-10-CM | POA: Diagnosis not present

## 2020-10-26 DIAGNOSIS — I872 Venous insufficiency (chronic) (peripheral): Secondary | ICD-10-CM | POA: Diagnosis not present

## 2020-10-26 DIAGNOSIS — I4819 Other persistent atrial fibrillation: Secondary | ICD-10-CM | POA: Diagnosis not present

## 2020-10-26 DIAGNOSIS — E039 Hypothyroidism, unspecified: Secondary | ICD-10-CM | POA: Diagnosis not present

## 2020-10-26 DIAGNOSIS — I10 Essential (primary) hypertension: Secondary | ICD-10-CM | POA: Diagnosis not present

## 2020-10-26 DIAGNOSIS — R69 Illness, unspecified: Secondary | ICD-10-CM | POA: Diagnosis not present

## 2020-10-26 DIAGNOSIS — C3491 Malignant neoplasm of unspecified part of right bronchus or lung: Secondary | ICD-10-CM | POA: Diagnosis not present

## 2020-10-26 DIAGNOSIS — J449 Chronic obstructive pulmonary disease, unspecified: Secondary | ICD-10-CM | POA: Diagnosis not present

## 2020-10-26 DIAGNOSIS — L719 Rosacea, unspecified: Secondary | ICD-10-CM | POA: Diagnosis not present

## 2020-10-26 DIAGNOSIS — E7800 Pure hypercholesterolemia, unspecified: Secondary | ICD-10-CM | POA: Diagnosis not present

## 2020-11-02 ENCOUNTER — Other Ambulatory Visit: Payer: Self-pay | Admitting: Physician Assistant

## 2020-11-15 DIAGNOSIS — J449 Chronic obstructive pulmonary disease, unspecified: Secondary | ICD-10-CM | POA: Diagnosis not present

## 2020-11-15 DIAGNOSIS — E785 Hyperlipidemia, unspecified: Secondary | ICD-10-CM | POA: Diagnosis not present

## 2020-11-15 DIAGNOSIS — I10 Essential (primary) hypertension: Secondary | ICD-10-CM | POA: Diagnosis not present

## 2020-11-15 DIAGNOSIS — M1990 Unspecified osteoarthritis, unspecified site: Secondary | ICD-10-CM | POA: Diagnosis not present

## 2020-11-15 DIAGNOSIS — E039 Hypothyroidism, unspecified: Secondary | ICD-10-CM | POA: Diagnosis not present

## 2020-11-15 DIAGNOSIS — E7800 Pure hypercholesterolemia, unspecified: Secondary | ICD-10-CM | POA: Diagnosis not present

## 2020-11-15 DIAGNOSIS — C3491 Malignant neoplasm of unspecified part of right bronchus or lung: Secondary | ICD-10-CM | POA: Diagnosis not present

## 2020-12-04 ENCOUNTER — Telehealth: Payer: Self-pay | Admitting: Cardiology

## 2020-12-04 NOTE — Telephone Encounter (Signed)
Patient c/o Palpitations:  High priority if patient c/o lightheadedness, shortness of breath, or chest pain  How long have you had palpitations/irregular HR/ Afib? Are you having the symptoms now? Wife stated pt has complained of Palpitations 2 or 3 times in the last couple of weeks  Are you currently experiencing lightheadedness, SOB or CP? She stated pt always has SOB because he as lung cancer , and COPD  Do you have a history of afib (atrial fibrillation) or irregular heart rhythm? Yes   Have you checked your BP or HR? (document readings if available):  130/90 HR is 79  Are you experiencing any other symptoms?   Best number -[phone]

## 2020-12-04 NOTE — Telephone Encounter (Signed)
Patient's wife calling back. °

## 2020-12-04 NOTE — Telephone Encounter (Signed)
Called patient's wife back. Patient has been having palpitations and complaining about elevated BP. Patient has history of A. FIB and he is on eliquis and diltiazem. Made patient an appointment with [facility] on Wednesday, Dr. Rayann Heman. Patient will go to ED if symptoms get worse.

## 2020-12-05 ENCOUNTER — Other Ambulatory Visit: Payer: Self-pay | Admitting: Cardiology

## 2020-12-05 NOTE — Telephone Encounter (Signed)
Prescription refill request for Eliquis received. Indication: afib  Last office visit: [place], 03/28/2020 Scr: 0.90, 10/16/2020 Age: 70 yo  Weight: 86.5 kg   Refill sent.

## 2020-12-06 ENCOUNTER — Other Ambulatory Visit: Payer: Self-pay

## 2020-12-06 ENCOUNTER — Ambulatory Visit: Payer: Medicare HMO | Admitting: Internal Medicine

## 2020-12-06 ENCOUNTER — Encounter: Payer: Self-pay | Admitting: Internal Medicine

## 2020-12-06 VITALS — BP 132/68 | HR 85 | Ht 73.0 in | Wt 193.0 lb

## 2020-12-06 DIAGNOSIS — R002 Palpitations: Secondary | ICD-10-CM

## 2020-12-06 DIAGNOSIS — I480 Paroxysmal atrial fibrillation: Secondary | ICD-10-CM

## 2020-12-06 MED ORDER — METOPROLOL SUCCINATE ER 100 MG PO TB24: 100.0000 mg | ORAL_TABLET | Freq: Every day | ORAL | 3 refills | Status: DC

## 2020-12-06 NOTE — Patient Instructions (Addendum)
Medication Instructions:  Increase Metoprolol Succinate to 100 mg at bedtime Your physician recommends that you continue on your current medications as directed. Please refer to the Current Medication list given to you today.  Labwork: None ordered.  Testing/Procedures: None ordered.  Follow-Up: Your physician wants you to follow-up in: Dr. Marlou Porch team ~3-4 week follow up.    Any Other Special Instructions Will Be Listed Below (If Applicable).  If you need a refill on your cardiac medications before your next appointment, please call your pharmacy.

## 2020-12-06 NOTE — Progress Notes (Signed)
PCP: [name], MD Primary Cardiologist: Dr Lazarus Salines is a 70 y.o. male who presents today for add on DOD followup.  He has permanent afib and is followed by Dr Marlou Porch.  He has recently noticed several episodes of nocturnal palpitations.  These are brief but anxiety provoking.  These palpitations have occurred since increasing levothyroxin from 75 to 100 mcg daily several weeks ago.    Today, he denies symptoms of chest pain, shortness of breath,  lower extremity edema, dizziness, presyncope, or syncope.  The patient is otherwise without complaint today.   Past Medical History:  Diagnosis Date   Arthritis    Atrial fibrillation ([place]) 10/2018   COPD with emphysema ([place])    Dyspnea    Hypertension    lung ca dx'd 10/2018   Past Surgical History:  Procedure Laterality Date   CARDIOVERSION N/A 01/11/2019   Procedure: CARDIOVERSION;  Surgeon: Josue Hector, MD;  Location: [facility] ENDOSCOPY;  Service: Cardiovascular;  Laterality: N/A;   KNEE ARTHROSCOPY     BIL    TOE SURGERY     PINNED LEFT BIG TOE   TONSILLECTOMY     T+A  AS CHILD   TOTAL KNEE ARTHROPLASTY Left 08/19/2017   Procedure: TOTAL KNEE ARTHROPLASTY;  Surgeon: Renette Butters, MD;  Location: [place];  Service: Orthopedics;  Laterality: Left;   VIDEO BRONCHOSCOPY Bilateral 11/05/2018   Procedure: VIDEO BRONCHOSCOPY WITH FLUORO;  Surgeon: [name], MD;  Location: [place];  Service: Cardiopulmonary;  Laterality: Bilateral;   VIDEO BRONCHOSCOPY Bilateral 11/09/2018   Procedure: VIDEO BRONCHOSCOPY WITH FLUORO;  Surgeon: Rigoberto Noel, MD;  Location: [place];  Service: Cardiopulmonary;  Laterality: Bilateral;    ROS- all systems are reviewed and negatives except as per HPI above  Current Outpatient Medications  Medication Sig Dispense Refill   albuterol (PROAIR HFA) 108 (90 Base) MCG/ACT inhaler Inhale 2 puffs into the lungs every 6 (six) hours as needed for wheezing or shortness of breath. 1 Inhaler  5   apixaban (ELIQUIS) 5 MG TABS tablet Take 1 tablet (5 mg total) by mouth 2 (two) times daily. 60 tablet 5   atorvastatin (LIPITOR) 20 MG tablet Take 20 mg by mouth daily.     diltiazem (CARDIZEM CD) 360 MG 24 hr capsule Take 1 capsule (360 mg total) by mouth daily. 90 capsule 3   levothyroxine (SYNTHROID) 100 MCG tablet Take 100 mcg by mouth daily before breakfast.     metoprolol succinate (TOPROL-XL) 25 MG 24 hr tablet TAKE ONE TABLET BY MOUTH EVERYDAY AT BEDTIME 30 tablet 0   metoprolol succinate (TOPROL-XL) 50 MG 24 hr tablet TAKE ONE TABLET BY MOUTH EVERYDAY AT BEDTIME WITH OR immediately following A meal 30 tablet 0   prochlorperazine (COMPAZINE) 10 MG tablet Take 1 tablet (10 mg total) by mouth every 6 (six) hours as needed for nausea or vomiting. 30 tablet 0   traZODone (DESYREL) 50 MG tablet TAKE 1 TABLET BY MOUTH EVERYDAY AT BEDTIME 90 tablet 1   No current facility-administered medications for this visit.    Physical Exam: Vitals:   12/06/20 1111  BP: 132/68  Pulse: 85  SpO2: 97%  Weight: 193 lb (87.5 kg)  Height: 6\' 1"  (1.854 m)    GEN- The patient is well appearing, alert and oriented x 3 today.   Head- normocephalic, atraumatic Eyes-  Sclera clear, conjunctiva pink Ears- hearing intact Oropharynx- clear Lungs- Clear to ausculation bilaterally, normal work of breathing Heart-  Regular rate and rhythm, no murmurs, rubs or gallops, PMI not laterally displaced GI- soft, NT, ND, + BS Extremities- no clubbing, cyanosis, or edema  Wt Readings from Last 3 Encounters:  12/06/20 193 lb (87.5 kg)  10/17/20 190 lb 9.6 oz (86.5 kg)  07/31/20 190 lb 12.8 oz (86.5 kg)    EKG tracing ordered today is personally reviewed and shows afib 85 bpm  Assessment and Plan:  Palpitations/ permanent afib He has permanent afib with severe biatrial enlargement.  I have increased toprol to 100mg  daily.  His palpitations are likely due to increased levothyroxin dosing recently. I have  reassured him today. Continue eliquis  2. HTN Increase toprol to 100mg  daily as above  3. HL Continue atorvastatin 20mg  daily  Followup with Dr Marlou Porch team in 2-3 weeks.  No further EP follow-up is required.  Thompson Grayer MD, [facility] 12/06/2020 11:39 AM

## 2020-12-11 DIAGNOSIS — E039 Hypothyroidism, unspecified: Secondary | ICD-10-CM | POA: Diagnosis not present

## 2020-12-11 DIAGNOSIS — C3491 Malignant neoplasm of unspecified part of right bronchus or lung: Secondary | ICD-10-CM | POA: Diagnosis not present

## 2020-12-11 DIAGNOSIS — I10 Essential (primary) hypertension: Secondary | ICD-10-CM | POA: Diagnosis not present

## 2020-12-11 DIAGNOSIS — J449 Chronic obstructive pulmonary disease, unspecified: Secondary | ICD-10-CM | POA: Diagnosis not present

## 2020-12-11 DIAGNOSIS — E785 Hyperlipidemia, unspecified: Secondary | ICD-10-CM | POA: Diagnosis not present

## 2020-12-11 DIAGNOSIS — E7800 Pure hypercholesterolemia, unspecified: Secondary | ICD-10-CM | POA: Diagnosis not present

## 2020-12-11 DIAGNOSIS — M1990 Unspecified osteoarthritis, unspecified site: Secondary | ICD-10-CM | POA: Diagnosis not present

## 2020-12-12 ENCOUNTER — Telehealth: Payer: Self-pay | Admitting: *Deleted

## 2020-12-12 NOTE — Telephone Encounter (Signed)
   Patient Name: Joe Cole  DOB: Nov 15, 1950 MRN: [id number]  Primary Cardiologist: Candee Furbish, MD  Chart reviewed as part of pre-operative protocol coverage.   Simple dental extractions (1-2 teeth) are considered low risk procedures per guidelines and generally do not require any specific cardiac clearance. It is also generally accepted that for simple extractions and dental cleanings, there is no need to interrupt blood thinner therapy.  SBE prophylaxis is not required for the patient from a cardiac standpoint.  I will route this recommendation to the requesting party via Epic fax function and remove from pre-op pool.  Please call with questions.  Abigail Butts, PA-C 12/12/2020, 3:53 PM

## 2020-12-12 NOTE — Telephone Encounter (Signed)
   [facility] Pre-operative Risk Assessment    Patient Name: Joe Cole  DOB: 1950-06-13 MRN: [id number]  HEARTCARE STAFF:  - IMPORTANT!!!!!! Under Visit Info/Reason for Call, type in Other and utilize the format Clearance MM/DD/YY or Clearance TBD. Do not use dashes or single digits. - Please review there is not already an duplicate clearance open for this procedure. - If request is for dental extraction, please clarify the # of teeth to be extracted. - If the patient is currently at the dentist's office, call Pre-Op Callback Staff (MA/nurse) to input urgent request.  - If the patient is not currently in the dentist office, please route to the Pre-Op pool.  Request for surgical clearance:  What type of surgery is being performed? Extraction of 2 teeth  When is this surgery scheduled? TBD  What type of clearance is required (medical clearance vs. Pharmacy clearance to hold med vs. Both)? Both  Are there any medications that need to be held prior to surgery and how long? Eliquis  Practice name and name of physician performing surgery? [facility]  What is the office phone number? [phone]   7.   What is the office fax number? [phone]  8.   Anesthesia type (None, local, MAC, general) ? Not listed   Juventino Slovak 12/12/2020, 2:48 PM  _________________________________________________________________   (provider comments below)

## 2020-12-22 NOTE — Progress Notes (Incomplete)
Cardiology Office Note:    Date:  12/22/2020   ID:  Joe Cole, DOB 1950-06-20, MRN [id number]  PCP:  [name], MD  [facility] Cardiologist:  Candee Furbish, MD  [facility] Electrophysiologist:  None   Referring MD: [name], MD     History of Present Illness:    Joe Cole is a 70 y.o. male here for the follow-up of permanent atrial fibrillation per Dr. Rayann Heman.*** Has hx of atrial fibrillation, hypertension, hyperlipidemia, COPD emphysema, tobacco use.  Prior cardioversion 01/12/2019 then had return of atrial fibrillation in 2 days.  Only felt minimal improvement with cardioversion.  He has been under good control with longstanding persistent at this point.  Overall, he does feel shortness of breath with activity mostly from his COPD.  His lung cancer therapy has been stable.  Notes reviewed from Dr. Earlie Server.  No bleeding.  See below for details.  No fevers chills nausea vomiting syncope bleeding.    He was seen by Dr Rayann Heman 12/06/2020 and reported several episodes of nocturnal palpitations. This was thought to be due to recently increased levothyroxin dosing. Toprol was increased to 100 mg daily. Today,  He denies any palpitations, chest pain, or shortness of breath. No lightheadedness, headaches, syncope, orthopnea, or PND. Also has no lower extremity edema or exertional symptoms.   Past Medical History:  Diagnosis Date   Arthritis    Atrial fibrillation ([place]) 10/2018   COPD with emphysema ([place])    Dyspnea    Hypertension    lung ca dx'd 10/2018    Past Surgical History:  Procedure Laterality Date   CARDIOVERSION N/A 01/11/2019   Procedure: CARDIOVERSION;  Surgeon: Josue Hector, MD;  Location: [facility] ENDOSCOPY;  Service: Cardiovascular;  Laterality: N/A;   KNEE ARTHROSCOPY     BIL    TOE SURGERY     PINNED LEFT BIG TOE   TONSILLECTOMY     T+A  AS CHILD   TOTAL KNEE ARTHROPLASTY Left 08/19/2017   Procedure: TOTAL KNEE ARTHROPLASTY;   Surgeon: Renette Butters, MD;  Location: [place];  Service: Orthopedics;  Laterality: Left;   VIDEO BRONCHOSCOPY Bilateral 11/05/2018   Procedure: VIDEO BRONCHOSCOPY WITH FLUORO;  Surgeon: [name], MD;  Location: [place];  Service: Cardiopulmonary;  Laterality: Bilateral;   VIDEO BRONCHOSCOPY Bilateral 11/09/2018   Procedure: VIDEO BRONCHOSCOPY WITH FLUORO;  Surgeon: Rigoberto Noel, MD;  Location: [place];  Service: Cardiopulmonary;  Laterality: Bilateral;    Current Medications: No outpatient medications have been marked as taking for the 12/26/20 encounter (Appointment) with Jerline Pain, MD.     Allergies:   Patient has no known allergies.   Social History   Socioeconomic History   Marital status: Married    Spouse name: Not on file   Number of children: Not on file   Years of education: Not on file   Highest education level: Not on file  Occupational History   Not on file  Tobacco Use   Smoking status: Former    Packs/day: 1.50    Years: 46.00    Pack years: 69.00    Types: Cigarettes    Quit date: 09/08/2018    Years since quitting: 2.2   Smokeless tobacco: Never  Vaping Use   Vaping Use: Never used  Substance and Sexual Activity   Alcohol use: Yes    Alcohol/week: 15.0 - 16.0 standard drinks    Types: 1 - 2 Cans of beer, 12 Shots of liquor, 2  Standard drinks or equivalent per week    Comment: FEW DRINKS AFTER WORK   Drug use: Never   Sexual activity: Not on file  Other Topics Concern   Not on file  Social History Narrative   Not on file   Social Determinants of Health   Financial Resource Strain: Not on file  Food Insecurity: Not on file  Transportation Needs: Not on file  Physical Activity: Not on file  Stress: Not on file  Social Connections: Not on file     Family History: The patient's family history includes Atrial fibrillation in his mother; Emphysema in his father; Pneumonia in his father.  ROS:   Please see the history of present  illness.   (+)  All other systems reviewed and are negative.  EKGs/Labs/Other Studies Reviewed:    The following studies were reviewed today:  Echo 11/06/18 demonstrated  1. The left ventricle has low normal systolic function, with an ejection fraction of 50-55%. The cavity size was mildly dilated. Left ventricular diastolic Doppler parameters are indeterminate.  2. Extremely poor acoustic windows limit study.  3. The right ventricle was not well visualized. The cavity was moderately enlarged. There is not assessed.  4. RV is not well seen RVEF is at least mildly depressed.  5. Left atrial size was severely dilated.  6. Right atrial size was severely dilated.  7. The mitral valve is abnormal. Mild thickening of the mitral valve leaflet. There is mild mitral annular calcification present.  8. The tricuspid valve is grossly normal.  9. The aortic valve is abnormal. Mild thickening of the aortic valve. Mild calcification of the aortic valve. Aortic valve regurgitation is trivial by color flow Doppler. 10. The inferior vena cava was dilated in size with <50% respiratory variability.    EKG:  EKG is personally reviewed and interpreted. 12/26/2020: *** 03/28/2020: EKG was not ordered. 02/23/2019: EKG showed atrial fibrillation rate controlled.  Recent Labs: 02/22/2020: TSH 16.033 10/16/2020: ALT 34; BUN 12; Creatinine 0.90; Hemoglobin 14.3; Platelet Count 168; Potassium 4.7; Sodium 133  Recent Lipid Panel No results found for: CHOL, TRIG, HDL, CHOLHDL, VLDL, LDLCALC, LDLDIRECT   Physical Exam:    VS:  There were no vitals taken for this visit.    Wt Readings from Last 3 Encounters:  12/06/20 193 lb (87.5 kg)  10/17/20 190 lb 9.6 oz (86.5 kg)  07/31/20 190 lb 12.8 oz (86.5 kg)   GEN: Well nourished, well developed in no acute distress HEENT: Normal NECK: No JVD; No carotid bruits LYMPHATICS: No lymphadenopathy CARDIAC: Irregular***, no murmurs, rubs, gallops RESPIRATORY:  Subtle  wheezes heard on exam***  ABDOMEN: Soft, non-tender, non-distended MUSCULOSKELETAL:  No edema; No deformity  SKIN: Warm and dry NEUROLOGIC:  Alert and oriented x 3 PSYCHIATRIC:  Normal affect    ASSESSMENT:    No diagnosis found.  PLAN:    In order of problems listed above: No problem-specific Assessment & Plan notes found for this encounter.   Longstanding persistent atrial fibrillation -Tried cardioversion but failed.  2020.  Has severe biatrial enlargement.  Minimal improvement clinically after cardioversion. -Continue with rate control. -Continue with Eliquis diltiazem and Toprol.  Chronic anticoagulation -CHA2DS2-VASc 2.  Continue with Eliquis.  Remember, wife's main concern was possibility of stroke.  His father had stroke on aspirin with atrial fibrillation.  Continue to monitor hemoglobin and creatinine.  COPD/lung cancer, stage III squamous cell carcinoma -Dr. Lew Dawes notes have been reviewed.  Currently stable. Finished immunotherapy. Stable.  --SOB mostly  from COPD.  Subtle wheezes heard today on exam.  Essential hypertension -Well controlled.  No changes made.  LDL 87 hemoglobin 13.7 creatinine 0.86 ALT 23  Medication Adjustments/Labs and Tests Ordered: Current medicines are reviewed at length with the patient today.  Concerns regarding medicines are outlined above.   No orders of the defined types were placed in this encounter.  No orders of the defined types were placed in this encounter.   There are no Patient Instructions on file for this visit.   I,Mathew Stumpf,acting as a Education administrator for [employer], MD.,have documented all relevant documentation on the behalf of Candee Furbish, MD,as directed by  Candee Furbish, MD while in the presence of Candee Furbish, MD.  ***  Signed, Madelin Rear  12/22/2020 2:31 PM    Joe Cole

## 2020-12-26 ENCOUNTER — Ambulatory Visit: Payer: Medicare HMO | Admitting: Cardiology

## 2021-01-10 DIAGNOSIS — I10 Essential (primary) hypertension: Secondary | ICD-10-CM | POA: Diagnosis not present

## 2021-01-10 DIAGNOSIS — E7800 Pure hypercholesterolemia, unspecified: Secondary | ICD-10-CM | POA: Diagnosis not present

## 2021-01-10 DIAGNOSIS — E785 Hyperlipidemia, unspecified: Secondary | ICD-10-CM | POA: Diagnosis not present

## 2021-01-10 DIAGNOSIS — J449 Chronic obstructive pulmonary disease, unspecified: Secondary | ICD-10-CM | POA: Diagnosis not present

## 2021-01-10 DIAGNOSIS — E039 Hypothyroidism, unspecified: Secondary | ICD-10-CM | POA: Diagnosis not present

## 2021-01-10 DIAGNOSIS — M1990 Unspecified osteoarthritis, unspecified site: Secondary | ICD-10-CM | POA: Diagnosis not present

## 2021-02-08 DIAGNOSIS — M1990 Unspecified osteoarthritis, unspecified site: Secondary | ICD-10-CM | POA: Diagnosis not present

## 2021-02-08 DIAGNOSIS — J449 Chronic obstructive pulmonary disease, unspecified: Secondary | ICD-10-CM | POA: Diagnosis not present

## 2021-02-08 DIAGNOSIS — E039 Hypothyroidism, unspecified: Secondary | ICD-10-CM | POA: Diagnosis not present

## 2021-02-08 DIAGNOSIS — E7800 Pure hypercholesterolemia, unspecified: Secondary | ICD-10-CM | POA: Diagnosis not present

## 2021-02-08 DIAGNOSIS — E785 Hyperlipidemia, unspecified: Secondary | ICD-10-CM | POA: Diagnosis not present

## 2021-02-08 DIAGNOSIS — Z23 Encounter for immunization: Secondary | ICD-10-CM | POA: Diagnosis not present

## 2021-02-08 DIAGNOSIS — I10 Essential (primary) hypertension: Secondary | ICD-10-CM | POA: Diagnosis not present

## 2021-03-12 DIAGNOSIS — I10 Essential (primary) hypertension: Secondary | ICD-10-CM | POA: Diagnosis not present

## 2021-03-12 DIAGNOSIS — M1990 Unspecified osteoarthritis, unspecified site: Secondary | ICD-10-CM | POA: Diagnosis not present

## 2021-03-12 DIAGNOSIS — E7800 Pure hypercholesterolemia, unspecified: Secondary | ICD-10-CM | POA: Diagnosis not present

## 2021-03-12 DIAGNOSIS — E039 Hypothyroidism, unspecified: Secondary | ICD-10-CM | POA: Diagnosis not present

## 2021-03-12 DIAGNOSIS — J449 Chronic obstructive pulmonary disease, unspecified: Secondary | ICD-10-CM | POA: Diagnosis not present

## 2021-03-12 DIAGNOSIS — E785 Hyperlipidemia, unspecified: Secondary | ICD-10-CM | POA: Diagnosis not present

## 2021-03-21 ENCOUNTER — Telehealth: Payer: Self-pay | Admitting: Internal Medicine

## 2021-03-21 NOTE — Telephone Encounter (Signed)
Rescheduled appointment per scheduling message. Spoke with patient's spouse. Appointment is confirmed.

## 2021-04-06 ENCOUNTER — Other Ambulatory Visit: Payer: Self-pay | Admitting: Cardiology

## 2021-04-10 DIAGNOSIS — J449 Chronic obstructive pulmonary disease, unspecified: Secondary | ICD-10-CM | POA: Diagnosis not present

## 2021-04-10 DIAGNOSIS — I10 Essential (primary) hypertension: Secondary | ICD-10-CM | POA: Diagnosis not present

## 2021-04-10 DIAGNOSIS — E039 Hypothyroidism, unspecified: Secondary | ICD-10-CM | POA: Diagnosis not present

## 2021-04-10 DIAGNOSIS — E785 Hyperlipidemia, unspecified: Secondary | ICD-10-CM | POA: Diagnosis not present

## 2021-04-10 DIAGNOSIS — M1990 Unspecified osteoarthritis, unspecified site: Secondary | ICD-10-CM | POA: Diagnosis not present

## 2021-04-12 ENCOUNTER — Ambulatory Visit (HOSPITAL_COMMUNITY)
Admission: RE | Admit: 2021-04-12 | Discharge: 2021-04-12 | Disposition: A | Discharge status: Home / Self Care | Payer: Medicare HMO | Source: Ambulatory Visit | Attending: Internal Medicine | Admitting: Internal Medicine

## 2021-04-12 ENCOUNTER — Other Ambulatory Visit: Payer: Self-pay

## 2021-04-12 ENCOUNTER — Inpatient Hospital Stay: Payer: Medicare HMO | Attending: Internal Medicine

## 2021-04-12 DIAGNOSIS — J90 Pleural effusion, not elsewhere classified: Secondary | ICD-10-CM | POA: Diagnosis not present

## 2021-04-12 DIAGNOSIS — I700 Atherosclerosis of aorta: Secondary | ICD-10-CM | POA: Diagnosis not present

## 2021-04-12 DIAGNOSIS — C3490 Malignant neoplasm of unspecified part of unspecified bronchus or lung: Secondary | ICD-10-CM

## 2021-04-12 DIAGNOSIS — J439 Emphysema, unspecified: Secondary | ICD-10-CM | POA: Diagnosis not present

## 2021-04-12 LAB — CBC WITH DIFFERENTIAL (CANCER CENTER ONLY)
Abs Immature Granulocytes: 0.03 10*3/uL (ref 0.00–0.07)
Basophils Absolute: 0.1 10*3/uL (ref 0.0–0.1)
Basophils Relative: 1 %
Eosinophils Absolute: 0.1 10*3/uL (ref 0.0–0.5)
Eosinophils Relative: 2 %
HCT: 37.8 % — ABNORMAL LOW (ref 39.0–52.0)
Hemoglobin: 13.7 g/dL (ref 13.0–17.0)
Immature Granulocytes: 1 %
Lymphocytes Relative: 15 %
Lymphs Abs: 0.8 10*3/uL (ref 0.7–4.0)
MCH: 43.1 pg — ABNORMAL HIGH (ref 26.0–34.0)
MCHC: 36.2 g/dL — ABNORMAL HIGH (ref 30.0–36.0)
MCV: 118.9 fL — ABNORMAL HIGH (ref 80.0–100.0)
Monocytes Absolute: 0.6 10*3/uL (ref 0.1–1.0)
Monocytes Relative: 12 %
Neutro Abs: 3.8 10*3/uL (ref 1.7–7.7)
Neutrophils Relative %: 69 %
Platelet Count: 152 10*3/uL (ref 150–400)
RBC: 3.18 MIL/uL — ABNORMAL LOW (ref 4.22–5.81)
RDW: 13.9 % (ref 11.5–15.5)
WBC Count: 5.4 10*3/uL (ref 4.0–10.5)
nRBC: 0 % (ref 0.0–0.2)

## 2021-04-12 LAB — CMP (CANCER CENTER ONLY)
ALT: 42 U/L (ref 0–44)
AST: 44 U/L — ABNORMAL HIGH (ref 15–41)
Albumin: 3.6 g/dL (ref 3.5–5.0)
Alkaline Phosphatase: 90 U/L (ref 38–126)
Anion gap: 11 (ref 5–15)
BUN: 10 mg/dL (ref 8–23)
CO2: 25 mmol/L (ref 22–32)
Calcium: 8.9 mg/dL (ref 8.9–10.3)
Chloride: 101 mmol/L (ref 98–111)
Creatinine: 0.82 mg/dL (ref 0.61–1.24)
GFR, Estimated: >60 mL/min (ref >60)
Glucose, Bld: 88 mg/dL (ref 70–99)
Potassium: 5 mmol/L (ref 3.5–5.1)
Sodium: 137 mmol/L (ref 135–145)
Total Bilirubin: 0.9 mg/dL (ref 0.3–1.2)
Total Protein: 6.8 g/dL (ref 6.5–8.1)

## 2021-04-12 MED ORDER — SODIUM CHLORIDE (PF) 0.9 % IJ SOLN
INTRAMUSCULAR | Status: AC
  Filled 2021-04-12: qty 50

## 2021-04-12 MED ORDER — IOHEXOL 350 MG/ML SOLN
60.0000 mL | Freq: Once | INTRAVENOUS | Status: AC | PRN
  Administered 2021-04-12: 60 mL via INTRAVENOUS

## 2021-04-13 ENCOUNTER — Other Ambulatory Visit: Payer: Medicare HMO

## 2021-04-18 ENCOUNTER — Telehealth: Payer: Self-pay | Admitting: Medical Oncology

## 2021-04-18 ENCOUNTER — Inpatient Hospital Stay (HOSPITAL_BASED_OUTPATIENT_CLINIC_OR_DEPARTMENT_OTHER): Payer: Medicare HMO | Admitting: Internal Medicine

## 2021-04-18 ENCOUNTER — Encounter: Payer: Self-pay | Admitting: Internal Medicine

## 2021-04-18 ENCOUNTER — Other Ambulatory Visit: Payer: Self-pay

## 2021-04-18 DIAGNOSIS — C3490 Malignant neoplasm of unspecified part of unspecified bronchus or lung: Secondary | ICD-10-CM | POA: Diagnosis not present

## 2021-04-18 DIAGNOSIS — C3491 Malignant neoplasm of unspecified part of right bronchus or lung: Secondary | ICD-10-CM | POA: Diagnosis not present

## 2021-04-18 NOTE — Progress Notes (Signed)
[place] [phone]   [phone]  PROGRESS NOTE FOR TELEMEDICINE VISITS  Joe Frees, MD [address] [place] [address]  I connected withNAME@ on 04/18/21 at  9:45 AM EST by telephone visit and verified that I am speaking with the correct person using two identifiers.   I discussed the limitations, risks, security and privacy concerns of performing an evaluation and management service by telemedicine and the availability of in-person appointments. I also discussed with the patient that there may be a patient responsible charge related to this service. The patient expressed understanding and agreed to proceed.  Other persons participating in the visit and their role in the encounter: Wife  Patient's location: Home Provider's location: [place]  DIAGNOSIS: Stage IIIA (T3, N1, M0) non-small cell lung cancer, squamous cell carcinoma diagnosed in July 2020 and presented with right middle lobe mass and right hilar adenopathy with postobstructive pneumonia and suspicious right pleural effusion.   PRIOR THERAPY:   1) Weekly concurrent chemoradiation with Carboplatin for an AUC of 2 and paclitaxel 45 mg/m2. First dose 11/23/2018. Status post 7 cycles.  2) Consolidation immunotherapy with Imfinzi 10 mg/KG every 2 weeks.  First dose February 08, 2019.  Status post 26 cycles.   CURRENT THERAPY: Observation.  INTERVAL HISTORY: Joe Cole 70 y.o. male has a telephone virtual visit with me today for evaluation and discussion of his scan results.  The patient is complaining of worsening cough and shortness of breath recently.  He also has flulike symptoms but has not been tested for COVID-19.  He denied having any chest pain or hemoptysis.  He denied having any fever or chills.  He has no nausea, vomiting, diarrhea or constipation.  He denied having any headache or visual changes.  He had CT scan of the chest performed  recently and we are having the telephone visit for discussion of his scan and treatment options.  MEDICAL HISTORY: Past Medical History:  Diagnosis Date   Arthritis    Atrial fibrillation ([place]) 10/2018   COPD with emphysema ([place])    Dyspnea    Hypertension    lung ca dx'd 10/2018    ALLERGIES:  has No Known Allergies.  MEDICATIONS:  Current Outpatient Medications  Medication Sig Dispense Refill   albuterol (PROAIR HFA) 108 (90 Base) MCG/ACT inhaler Inhale 2 puffs into the lungs every 6 (six) hours as needed for wheezing or shortness of breath. 1 Inhaler 5   apixaban (ELIQUIS) 5 MG TABS tablet Take 1 tablet (5 mg total) by mouth 2 (two) times daily. 60 tablet 5   atorvastatin (LIPITOR) 20 MG tablet Take 20 mg by mouth daily.     diltiazem (CARDIZEM CD) 360 MG 24 hr capsule Take 1 capsule (360 mg total) by mouth daily. Please keep upcoming appt in January 2023 with Dr. Marlou Porch before anymore refills. Thank you 90 capsule 0   levothyroxine (SYNTHROID) 100 MCG tablet Take 100 mcg by mouth daily before breakfast.     metoprolol succinate (TOPROL-XL) 100 MG 24 hr tablet Take 1 tablet (100 mg total) by mouth daily. Take with or immediately following a meal. 90 tablet 3   prochlorperazine (COMPAZINE) 10 MG tablet Take 1 tablet (10 mg total) by mouth every 6 (six) hours as needed for nausea or vomiting. 30 tablet 0   traZODone (DESYREL) 50 MG tablet TAKE 1 TABLET BY MOUTH EVERYDAY AT BEDTIME 90 tablet 1   No current facility-administered medications  for this visit.    SURGICAL HISTORY:  Past Surgical History:  Procedure Laterality Date   CARDIOVERSION N/A 01/11/2019   Procedure: CARDIOVERSION;  Surgeon: Josue Hector, MD;  Location: [facility] ENDOSCOPY;  Service: Cardiovascular;  Laterality: N/A;   KNEE ARTHROSCOPY     BIL    TOE SURGERY     PINNED LEFT BIG TOE   TONSILLECTOMY     T+A  AS CHILD   TOTAL KNEE ARTHROPLASTY Left 08/19/2017   Procedure: TOTAL KNEE ARTHROPLASTY;  Surgeon: Renette Butters, MD;  Location: [place];  Service: Orthopedics;  Laterality: Left;   VIDEO BRONCHOSCOPY Bilateral 11/05/2018   Procedure: VIDEO BRONCHOSCOPY WITH FLUORO;  Surgeon: [name], MD;  Location: [place];  Service: Cardiopulmonary;  Laterality: Bilateral;   VIDEO BRONCHOSCOPY Bilateral 11/09/2018   Procedure: VIDEO BRONCHOSCOPY WITH FLUORO;  Surgeon: Rigoberto Noel, MD;  Location: [place];  Service: Cardiopulmonary;  Laterality: Bilateral;    REVIEW OF SYSTEMS:  A comprehensive review of systems was negative except for: Constitutional: positive for fatigue Respiratory: positive for cough and dyspnea on exertion   LABORATORY DATA: Lab Results  Component Value Date   WBC 5.4 04/12/2021   HGB 13.7 04/12/2021   HCT 37.8 (L) 04/12/2021   MCV 118.9 (H) 04/12/2021   PLT 152 04/12/2021      Chemistry      Component Value Date/Time   NA 137 04/12/2021 1118   K 5.0 04/12/2021 1118   CL 101 04/12/2021 1118   CO2 25 04/12/2021 1118   BUN 10 04/12/2021 1118   CREATININE 0.82 04/12/2021 1118      Component Value Date/Time   CALCIUM 8.9 04/12/2021 1118   ALKPHOS 90 04/12/2021 1118   AST 44 (H) 04/12/2021 1118   ALT 42 04/12/2021 1118   BILITOT 0.9 04/12/2021 1118       RADIOGRAPHIC STUDIES: CT Chest W Contrast  Result Date: 04/13/2021 CLINICAL DATA:  Non-small cell lung cancer follow-up in a 70 year old male. EXAM: CT CHEST WITH CONTRAST TECHNIQUE: Multidetector CT imaging of the chest was performed during intravenous contrast administration. CONTRAST:  43mL OMNIPAQUE IOHEXOL 350 MG/ML SOLN COMPARISON:  October 16, 2020. FINDINGS: Cardiovascular: Calcified and noncalcified atheromatous plaque in the thoracic aorta. No aneurysmal dilation or change from previous imaging. Heart size is stable. No substantial pericardial effusion or nodularity. Central pulmonary vasculature is normal in caliber. Mediastinum/Nodes: No thoracic inlet, axillary, mediastinal or hilar adenopathy.  Esophagus grossly normal. No frank hilar adenopathy. Increasing volume loss in the RIGHT middle lobe, see below. Lungs/Pleura: Near complete collapse of the RIGHT middle lobe since previous imaging. Imaging findings likely reflect post radiation change though there is endobronchial material in the RIGHT middle lobe bronchi leading to this post obstructive pattern and a slightly convex margin of collapsed lung along the RIGHT heart border. Moderately large RIGHT pleural effusion has increased in size, depth of approximately 8 cm as compared to 4-1/2 cm on the prior study. No dependent nodularity. Small LEFT-sided effusion. Background pulmonary emphysema without discrete suspicious lesion. Upper Abdomen: Linear band of. Low attenuation throughout the liver may relate to steatotic changes in the setting of prior radiation and appear more pronounced peripherally in the medial segment as compared to the lateral segment of the LEFT hepatic lobe than on previous imaging but with grossly similar size. Stable low-density RIGHT adrenal lesion and mild LEFT adrenal thickening. No acute upper abdominal process. Musculoskeletal: No acute bone finding. No destructive bone process. Spinal degenerative changes. IMPRESSION:  Near complete collapse of the RIGHT middle lobe since previous imaging. Imaging findings likely reflect post radiation change though there is endobronchial material in the RIGHT middle lobe bronchi leading to this post obstructive pattern and a slightly convex margin of collapsed lung along the RIGHT heart border. Given findings of further collapse in an area that has been relatively stable since 2019 would consider PET for further evaluation to exclude developing neoplasm at the site of prior radiotherapy. Currently findings are equivocal but do raise the question of local disease recurrence. Increasing RIGHT-sided pleural effusion with new LEFT effusion. Bandlike area of low attenuation in the liver more likely  related to prior radiotherapy and steatotic changes. Stable low-density RIGHT adrenal lesion and mild LEFT adrenal thickening. Emphysema and aortic atherosclerosis. Electronically Signed   By: Zetta Bills M.D.   On: 04/13/2021 17:40    ASSESSMENT AND PLAN: This is a very pleasant 70 years old white male with stage IIIA non-small cell lung cancer, squamous cell carcinoma.  The patient underwent a course of concurrent chemoradiation with weekly carboplatin and paclitaxel status post 7 cycles.  The patient tolerated his treatment well except for mild fatigue and odynophagia. The patient completed a course of consolidation treatment with immunotherapy with Imfinzi every 2 weeks status post 26 cycles.  He tolerated his treatment well with no concerning adverse effects. The patient has been on observation since that time. He had repeat CT scan of the chest performed recently.  I personally and independently reviewed the scan images and discussed the results with the patient today. His scan showed near complete collapse of the right middle lobe since the previous imaging and the finding are suspicious for central obstructing lesion.  There was also increasing right-sided pleural effusion and new left effusion. I discussed the scan results with the patient and recommended for him to have a PET scan for further evaluation of this lesion and to rule out any disease recurrence. I will see the patient back for follow-up visit in around 3 weeks for evaluation and more detailed discussion of his treatment options based on the PET scan results. He was advised to call immediately if he has any other concerning symptoms in the interval. I discussed the assessment and treatment plan with the patient. The patient was provided an opportunity to ask questions and all were answered. The patient agreed with the plan and demonstrated an understanding of the instructions.   The patient was advised to call back or seek an  in-person evaluation if the symptoms worsen or if the condition fails to improve as anticipated.  I provided 15 minutes of non face-to-face telephone visit time during this encounter, and > 50% was spent counseling as documented under my assessment & plan.  Eilleen Kempf, MD 04/18/2021 1:41 PM  Disclaimer: This note was dictated with voice recognition software. Similar sounding words can inadvertently be transcribed and may not be corrected upon review.

## 2021-04-18 NOTE — Telephone Encounter (Signed)
° °  LVM to see if pt will do video or phone visit today?  Pt sick today-schedule message sent today.

## 2021-04-27 ENCOUNTER — Telehealth: Payer: Self-pay | Admitting: Internal Medicine

## 2021-04-27 NOTE — Telephone Encounter (Signed)
Sch per 12/21 los, pt & wife aware

## 2021-05-01 ENCOUNTER — Other Ambulatory Visit: Payer: Self-pay | Admitting: Cardiology

## 2021-05-01 ENCOUNTER — Telehealth: Payer: Self-pay | Admitting: Cardiology

## 2021-05-01 DIAGNOSIS — I4891 Unspecified atrial fibrillation: Secondary | ICD-10-CM

## 2021-05-01 NOTE — Telephone Encounter (Signed)
Prescription refill request for Eliquis received. Indication: a fib Last office visit: 12/06/20 Scr: 0.82 Age: 71 Weight: 87kg

## 2021-05-01 NOTE — Telephone Encounter (Signed)
Patient c/o Palpitations:  High priority if patient c/o lightheadedness, shortness of breath, or chest pain  How long have you had palpitations/irregular HR/ Afib? Are you having the symptoms now? yes  Are you currently experiencing lightheadedness, SOB or CP? sob  Do you have a history of afib (atrial fibrillation) or irregular heart rhythm? yes  Have you checked your BP or HR? (document readings if available): 120/70  Are you experiencing any other symptoms? no

## 2021-05-01 NOTE — Telephone Encounter (Signed)
Returned call to pt. No answer. Left msg to call back.  

## 2021-05-01 NOTE — Telephone Encounter (Signed)
Spoke with wife [name] who states that pt doesn't normally c/o feeling heat out of rhythm. On today pt c/o feeling heart out of rhythm. Pt is SOB but also has COPD and wife is unsure if that is what's causing it or not. After initial phone call pt states that he feels better. Wife [name] will continue to monitor and call if sx worsen.

## 2021-05-02 ENCOUNTER — Ambulatory Visit: Payer: Medicare HMO | Admitting: Internal Medicine

## 2021-05-07 ENCOUNTER — Ambulatory Visit: Payer: Medicare HMO | Admitting: Cardiology

## 2021-05-07 ENCOUNTER — Other Ambulatory Visit: Payer: Self-pay

## 2021-05-07 ENCOUNTER — Encounter: Payer: Self-pay | Admitting: Cardiology

## 2021-05-07 DIAGNOSIS — J449 Chronic obstructive pulmonary disease, unspecified: Secondary | ICD-10-CM | POA: Diagnosis not present

## 2021-05-07 DIAGNOSIS — D6869 Other thrombophilia: Secondary | ICD-10-CM

## 2021-05-07 DIAGNOSIS — C3491 Malignant neoplasm of unspecified part of right bronchus or lung: Secondary | ICD-10-CM | POA: Diagnosis not present

## 2021-05-07 DIAGNOSIS — I4811 Longstanding persistent atrial fibrillation: Secondary | ICD-10-CM | POA: Diagnosis not present

## 2021-05-07 DIAGNOSIS — E7800 Pure hypercholesterolemia, unspecified: Secondary | ICD-10-CM

## 2021-05-07 NOTE — Progress Notes (Signed)
Cardiology Office Note:    Date:  05/07/2021   ID:  Joe Cole, DOB July 24, 1950, MRN [id number]  PCP:  [name], MD   [facility] Providers Cardiologist:  Candee Furbish, MD     Referring MD: [name], MD    History of Present Illness:    Joe Cole is a 71 y.o. male atrial fibrillation, Dr. Rayann Heman, phone call January 3 feeling as though his heart was out of rhythm.  He was short of breath but also has COPD wife is unsure what is causing this.  After initial phone call he stated that he felt better.  Has stage III non-small cell lung cancer diagnosed in July 2020 post chemotherapy with radiation.  Follows with Dr. Earlie Server.  In review of Dr. Jackalyn Lombard previous note from 12/06/2020 he has permanent atrial fibrillation.  Noticed some nocturnal palpitations, anxiety provoking.  On Eliquis.  Has severe biatrial enlargement.  Toprol was increased to 100 mg daily.  His palpitations were felt to be secondary to increase levothyroxine dosing.  Reassurance.  He also takes atorvastatin for hyperlipidemia.  No EP follow-up was felt necessary.  He is going for a PET scan on Thursday.  There have been changes on CT scan, followed by Dr. Earlie Server.  Decreased energy, increasing cough, mucus production especially in the mornings.  Past Medical History:  Diagnosis Date   Arthritis    Atrial fibrillation ([place]) 10/2018   COPD with emphysema ([place])    Dyspnea    Hypertension    lung ca dx'd 10/2018    Past Surgical History:  Procedure Laterality Date   CARDIOVERSION N/A 01/11/2019   Procedure: CARDIOVERSION;  Surgeon: Josue Hector, MD;  Location: [facility] ENDOSCOPY;  Service: Cardiovascular;  Laterality: N/A;   KNEE ARTHROSCOPY     BIL    TOE SURGERY     PINNED LEFT BIG TOE   TONSILLECTOMY     T+A  AS CHILD   TOTAL KNEE ARTHROPLASTY Left 08/19/2017   Procedure: TOTAL KNEE ARTHROPLASTY;  Surgeon: Renette Butters, MD;  Location: [place];  Service: Orthopedics;  Laterality:  Left;   VIDEO BRONCHOSCOPY Bilateral 11/05/2018   Procedure: VIDEO BRONCHOSCOPY WITH FLUORO;  Surgeon: [name], MD;  Location: [place];  Service: Cardiopulmonary;  Laterality: Bilateral;   VIDEO BRONCHOSCOPY Bilateral 11/09/2018   Procedure: VIDEO BRONCHOSCOPY WITH FLUORO;  Surgeon: Rigoberto Noel, MD;  Location: [place];  Service: Cardiopulmonary;  Laterality: Bilateral;    Current Medications: Current Meds  Medication Sig   apixaban (ELIQUIS) 5 MG TABS tablet Take 1 tablet (5 mg total) by mouth 2 (two) times daily.   atorvastatin (LIPITOR) 20 MG tablet Take 20 mg by mouth daily.   diltiazem (CARDIZEM CD) 360 MG 24 hr capsule Take 1 capsule (360 mg total) by mouth daily. Please keep upcoming appt in January 2023 with Dr. Marlou Porch before anymore refills. Thank you   levothyroxine (SYNTHROID) 88 MCG tablet Take 88 mcg by mouth daily.   metoprolol succinate (TOPROL-XL) 100 MG 24 hr tablet Take 1 tablet (100 mg total) by mouth daily. Take with or immediately following a meal.   prochlorperazine (COMPAZINE) 10 MG tablet Take 1 tablet (10 mg total) by mouth every 6 (six) hours as needed for nausea or vomiting.   traZODone (DESYREL) 50 MG tablet TAKE 1 TABLET BY MOUTH EVERYDAY AT BEDTIME     Allergies:   Patient has no known allergies.   Social History   Socioeconomic History   Marital  status: Married    Spouse name: Not on file   Number of children: Not on file   Years of education: Not on file   Highest education level: Not on file  Occupational History   Not on file  Tobacco Use   Smoking status: Former    Packs/day: 1.50    Years: 46.00    Pack years: 69.00    Types: Cigarettes    Quit date: 09/08/2018    Years since quitting: 2.6   Smokeless tobacco: Never  Vaping Use   Vaping Use: Never used  Substance and Sexual Activity   Alcohol use: Yes    Alcohol/week: 15.0 - 16.0 standard drinks    Types: 1 - 2 Cans of beer, 12 Shots of liquor, 2 Standard drinks or  equivalent per week    Comment: FEW DRINKS AFTER WORK   Drug use: Never   Sexual activity: Not on file  Other Topics Concern   Not on file  Social History Narrative   Not on file   Social Determinants of Health   Financial Resource Strain: Not on file  Food Insecurity: Not on file  Transportation Needs: Not on file  Physical Activity: Not on file  Stress: Not on file  Social Connections: Not on file     Family History: The patient's family history includes Atrial fibrillation in his mother; Emphysema in his father; Pneumonia in his father.  ROS:   Please see the history of present illness.    No syncope, no bleeding all other systems reviewed and are negative.  EKGs/Labs/Other Studies Reviewed:    The following studies were reviewed today: Chest CT, prior echocardiogram/EKG  Recent Labs: 04/12/2021: ALT 42; BUN 10; Creatinine 0.82; Hemoglobin 13.7; Platelet Count 152; Potassium 5.0; Sodium 137  Recent Lipid Panel No results found for: CHOL, TRIG, HDL, CHOLHDL, VLDL, LDLCALC, LDLDIRECT          Physical Exam:    VS:  BP 110/60 (BP Location: Left Arm, Patient Position: Sitting, Cuff Size: Normal)    Pulse 62    Ht 6\' 1"  (1.854 m)    Wt 192 lb (87.1 kg)    BMI 25.33 kg/m     Wt Readings from Last 3 Encounters:  05/07/21 192 lb (87.1 kg)  12/06/20 193 lb (87.5 kg)  10/17/20 190 lb 9.6 oz (86.5 kg)     GEN:  Well nourished, well developed in no acute distress HEENT: Normal NECK: No JVD; No carotid bruits LYMPHATICS: No lymphadenopathy CARDIAC: Irregularly irregular, no murmurs, no rubs, gallops RESPIRATORY: Decreased breath sounds bilaterally.  Subtle wheezes heard. ABDOMEN: Soft, non-tender, non-distended MUSCULOSKELETAL:  No edema; No deformity  SKIN: Warm and dry NEUROLOGIC:  Alert and oriented x 3 PSYCHIATRIC:  Normal affect   ASSESSMENT:    1. Longstanding persistent atrial fibrillation ([place])   2. Secondary hypercoagulable state ([place])   3. Pure  hypercholesterolemia   4. COPD mixed type ([place])   5. Stage III squamous cell carcinoma of right lung (HCC)    PLAN:    In order of problems listed above:  Longstanding persistent atrial fibrillation ([place]) On Eliquis for anticoagulation.  Continue with Cardizem CD 360 mg a day.  Continue with Toprol-XL 100 mg a day.  Overall well rate controlled.  No changes made.  Secondary hypercoagulable state ([place]) Eliquis.  Continue with 5 mg twice a day.  No bleeding.  Continue to monitor lab work for high risk medication.  Hemoglobin 13.7 creatinine 0.8 at last  check.  Hyperlipidemia Continue with atorvastatin 20 mg a day.  Prior LDL 74.  COPD mixed type [facility]) He has had progressive cough.  States that albuterol does not help.  It is been since July 2021 since he has seen pulmonary.  Encouraged new appointment with them.  Certainly underlying lung pathology may be playing a role as well.  Stage III squamous cell carcinoma of right lung (HCC) Dr. Earlie Server has ordered a PET scan.  He is getting this done on Thursday.         Medication Adjustments/Labs and Tests Ordered: Current medicines are reviewed at length with the patient today.  Concerns regarding medicines are outlined above.  No orders of the defined types were placed in this encounter.  No orders of the defined types were placed in this encounter.   Patient Instructions  Medication Instructions:  The current medical regimen is effective;  continue present plan and medications.  *If you need a refill on your cardiac medications before your next appointment, please call your pharmacy*  Follow-Up: At [facility], you and your health needs are our priority.  As part of our continuing mission to provide you with exceptional heart care, we have created designated Provider Care Teams.  These Care Teams include your primary Cardiologist (physician) and Advanced Practice Providers (APPs -  Physician Assistants and Nurse  Practitioners) who all work together to provide you with the care you need, when you need it.  We recommend signing up for the patient portal called "MyChart".  Sign up information is provided on this After Visit Summary.  MyChart is used to connect with patients for Virtual Visits (Telemedicine).  Patients are able to view lab/test results, encounter notes, upcoming appointments, etc.  Non-urgent messages can be sent to your provider as well.   To learn more about what you can do with MyChart, go to [email or web address].    Your next appointment:   1 year(s)  The format for your next appointment:   In Person  Provider:   Candee Furbish, MD     Thank you for choosing [facility]!!      Signed, Candee Furbish, MD  05/07/2021 9:52 AM    [place]

## 2021-05-07 NOTE — Assessment & Plan Note (Signed)
He has had progressive cough.  States that albuterol does not help.  It is been since July 2021 since he has seen pulmonary.  Encouraged new appointment with them.  Certainly underlying lung pathology may be playing a role as well.

## 2021-05-07 NOTE — Assessment & Plan Note (Signed)
On Eliquis for anticoagulation.  Continue with Cardizem CD 360 mg a day.  Continue with Toprol-XL 100 mg a day.  Overall well rate controlled.  No changes made.

## 2021-05-07 NOTE — Patient Instructions (Signed)
Medication Instructions:  The current medical regimen is effective;  continue present plan and medications.  *If you need a refill on your cardiac medications before your next appointment, please call your pharmacy*  Follow-Up: At [facility], you and your health needs are our priority.  As part of our continuing mission to provide you with exceptional heart care, we have created designated Provider Care Teams.  These Care Teams include your primary Cardiologist (physician) and Advanced Practice Providers (APPs -  Physician Assistants and Nurse Practitioners) who all work together to provide you with the care you need, when you need it.  We recommend signing up for the patient portal called "MyChart".  Sign up information is provided on this After Visit Summary.  MyChart is used to connect with patients for Virtual Visits (Telemedicine).  Patients are able to view lab/test results, encounter notes, upcoming appointments, etc.  Non-urgent messages can be sent to your provider as well.   To learn more about what you can do with MyChart, go to [email or web address].    Your next appointment:   1 year(s)  The format for your next appointment:   In Person  Provider:   Mark Skains, MD   Thank you for choosing [facility]!!    

## 2021-05-07 NOTE — Assessment & Plan Note (Signed)
Continue with atorvastatin 20 mg a day.  Prior LDL 74.

## 2021-05-07 NOTE — Assessment & Plan Note (Signed)
Eliquis.  Continue with 5 mg twice a day.  No bleeding.  Continue to monitor lab work for high risk medication.  Hemoglobin 13.7 creatinine 0.8 at last check.

## 2021-05-07 NOTE — Assessment & Plan Note (Signed)
Dr. Earlie Server has ordered a PET scan.  He is getting this done on Thursday.

## 2021-05-10 ENCOUNTER — Encounter (HOSPITAL_COMMUNITY): Payer: Medicare HMO

## 2021-05-15 ENCOUNTER — Encounter (HOSPITAL_COMMUNITY)
Admission: RE | Admit: 2021-05-15 | Discharge: 2021-05-15 | Disposition: A | Discharge status: Home / Self Care | Payer: Medicare HMO | Source: Ambulatory Visit | Attending: Internal Medicine | Admitting: Internal Medicine

## 2021-05-15 DIAGNOSIS — J90 Pleural effusion, not elsewhere classified: Secondary | ICD-10-CM | POA: Diagnosis not present

## 2021-05-15 DIAGNOSIS — J9819 Other pulmonary collapse: Secondary | ICD-10-CM | POA: Diagnosis not present

## 2021-05-15 DIAGNOSIS — C3490 Malignant neoplasm of unspecified part of unspecified bronchus or lung: Secondary | ICD-10-CM | POA: Diagnosis not present

## 2021-05-15 DIAGNOSIS — J439 Emphysema, unspecified: Secondary | ICD-10-CM | POA: Diagnosis not present

## 2021-05-15 LAB — GLUCOSE, CAPILLARY: Glucose-Capillary: 110 mg/dL — ABNORMAL HIGH (ref 70–99)

## 2021-05-15 MED ORDER — FLUDEOXYGLUCOSE F - 18 (FDG) INJECTION
9.5000 | Freq: Once | INTRAVENOUS | Status: AC | PRN
  Administered 2021-05-15: 9.37 via INTRAVENOUS

## 2021-05-17 ENCOUNTER — Other Ambulatory Visit: Payer: Self-pay

## 2021-05-17 ENCOUNTER — Inpatient Hospital Stay: Payer: Medicare HMO | Attending: Internal Medicine | Admitting: Internal Medicine

## 2021-05-17 VITALS — BP 129/80 | HR 70 | Temp 97.2°F | Resp 18 | Ht 73.0 in | Wt 193.7 lb

## 2021-05-17 DIAGNOSIS — I4891 Unspecified atrial fibrillation: Secondary | ICD-10-CM | POA: Insufficient documentation

## 2021-05-17 DIAGNOSIS — Z7901 Long term (current) use of anticoagulants: Secondary | ICD-10-CM | POA: Insufficient documentation

## 2021-05-17 DIAGNOSIS — Z923 Personal history of irradiation: Secondary | ICD-10-CM | POA: Diagnosis not present

## 2021-05-17 DIAGNOSIS — Z79899 Other long term (current) drug therapy: Secondary | ICD-10-CM | POA: Insufficient documentation

## 2021-05-17 DIAGNOSIS — C3490 Malignant neoplasm of unspecified part of unspecified bronchus or lung: Secondary | ICD-10-CM | POA: Diagnosis not present

## 2021-05-17 DIAGNOSIS — I10 Essential (primary) hypertension: Secondary | ICD-10-CM | POA: Diagnosis not present

## 2021-05-17 DIAGNOSIS — Z9221 Personal history of antineoplastic chemotherapy: Secondary | ICD-10-CM | POA: Insufficient documentation

## 2021-05-17 NOTE — Progress Notes (Signed)
[facility] [phone]   [phone]  OFFICE PROGRESS NOTE  [name], MD [place] [address]  DIAGNOSIS: Stage IIIA (T3, N1, M0) non-small cell lung cancer, squamous cell carcinoma diagnosed in July 2020 and presented with right middle lobe mass and right hilar adenopathy with postobstructive pneumonia and suspicious right pleural effusion.   PRIOR THERAPY:   1) Weekly concurrent chemoradiation with Carboplatin for an AUC of 2 and paclitaxel 45 mg/m2. First dose 11/23/2018. Status post 7 cycles.  2) Consolidation immunotherapy with Imfinzi 10 mg/KG every 2 weeks.  First dose February 08, 2019.  Status post 26 cycles.  CURRENT THERAPY: Observation.  INTERVAL HISTORY: Dionis Autry Genco 71 y.o. male returns to the clinic today for follow-up visit accompanied by his wife.  The patient is very anxious today about the scan results.  He denied having any current chest pain but has shortness of breath with exertion and cough with no hemoptysis.  He denied having any recent weight loss or night sweats.  He has no nausea, vomiting, diarrhea or constipation.  He has no headache or visual changes.  He denied having any fever or chills.  He was found on previous CT scan of the chest to have suspicious central obstructive lesion with collapse of the right middle lobe.  I ordered a PET scan and the patient is here today for evaluation and discussion of his scan results and treatment of his condition.  MEDICAL HISTORY: Past Medical History:  Diagnosis Date   Arthritis    Atrial fibrillation ([place]) 10/2018   COPD with emphysema ([place])    Dyspnea    Hypertension    lung ca dx'd 10/2018    ALLERGIES:  has No Known Allergies.  MEDICATIONS:  Current Outpatient Medications  Medication Sig Dispense Refill   apixaban (ELIQUIS) 5 MG TABS tablet Take 1 tablet (5 mg total) by mouth 2 (two) times daily. 180 tablet 1   atorvastatin  (LIPITOR) 20 MG tablet Take 20 mg by mouth daily.     diltiazem (CARDIZEM CD) 360 MG 24 hr capsule Take 1 capsule (360 mg total) by mouth daily. Please keep upcoming appt in January 2023 with Dr. Marlou Porch before anymore refills. Thank you 90 capsule 0   levothyroxine (SYNTHROID) 88 MCG tablet Take 88 mcg by mouth daily.     metoprolol succinate (TOPROL-XL) 100 MG 24 hr tablet Take 1 tablet (100 mg total) by mouth daily. Take with or immediately following a meal. 90 tablet 3   traZODone (DESYREL) 50 MG tablet TAKE 1 TABLET BY MOUTH EVERYDAY AT BEDTIME 90 tablet 1   prochlorperazine (COMPAZINE) 10 MG tablet Take 1 tablet (10 mg total) by mouth every 6 (six) hours as needed for nausea or vomiting. (Patient not taking: Reported on 05/17/2021) 30 tablet 0   No current facility-administered medications for this visit.    SURGICAL HISTORY:  Past Surgical History:  Procedure Laterality Date   CARDIOVERSION N/A 01/11/2019   Procedure: CARDIOVERSION;  Surgeon: Josue Hector, MD;  Location: [facility] ENDOSCOPY;  Service: Cardiovascular;  Laterality: N/A;   KNEE ARTHROSCOPY     BIL    TOE SURGERY     PINNED LEFT BIG TOE   TONSILLECTOMY     T+A  AS CHILD   TOTAL KNEE ARTHROPLASTY Left 08/19/2017   Procedure: TOTAL KNEE ARTHROPLASTY;  Surgeon: Renette Butters, MD;  Location: [facility];  Service: Orthopedics;  Laterality: Left;  VIDEO BRONCHOSCOPY Bilateral 11/05/2018   Procedure: VIDEO BRONCHOSCOPY WITH FLUORO;  Surgeon: [name], MD;  Location: [facility] ENDOSCOPY;  Service: Cardiopulmonary;  Laterality: Bilateral;   VIDEO BRONCHOSCOPY Bilateral 11/09/2018   Procedure: VIDEO BRONCHOSCOPY WITH FLUORO;  Surgeon: Rigoberto Noel, MD;  Location: [facility];  Service: Cardiopulmonary;  Laterality: Bilateral;    REVIEW OF SYSTEMS:  Constitutional: positive for fatigue Eyes: negative Ears, nose, mouth, throat, and face: negative Respiratory: positive for cough and dyspnea on exertion Cardiovascular:  negative Gastrointestinal: negative Genitourinary:negative Integument/breast: negative Hematologic/lymphatic: negative Musculoskeletal:negative Neurological: negative Behavioral/Psych: negative Endocrine: negative Allergic/Immunologic: negative   PHYSICAL EXAMINATION: General appearance: alert, cooperative, fatigued, and no distress Head: Normocephalic, without obvious abnormality, atraumatic Neck: no adenopathy, no JVD, supple, symmetrical, trachea midline, and thyroid not enlarged, symmetric, no tenderness/mass/nodules Lymph nodes: Cervical, supraclavicular, and axillary nodes normal. Resp: diminished breath sounds RLL and dullness to percussion RLL Back: symmetric, no curvature. ROM normal. No CVA tenderness. Cardio: regular rate and rhythm, S1, S2 normal, no murmur, click, rub or gallop GI: soft, non-tender; bowel sounds normal; no masses,  no organomegaly Extremities: extremities normal, atraumatic, no cyanosis or edema Neurologic: Alert and oriented X 3, normal strength and tone. Normal symmetric reflexes. Normal coordination and gait  ECOG PERFORMANCE STATUS: 1 - Symptomatic but completely ambulatory  Blood pressure 129/80, pulse 70, temperature (!) 97.2 F (36.2 C), temperature source Tympanic, resp. rate 18, height 6\' 1"  (1.854 m), weight 193 lb 11.2 oz (87.9 kg), SpO2 98 %.  LABORATORY DATA: Lab Results  Component Value Date   WBC 5.4 04/12/2021   HGB 13.7 04/12/2021   HCT 37.8 (L) 04/12/2021   MCV 118.9 (H) 04/12/2021   PLT 152 04/12/2021      Chemistry      Component Value Date/Time   NA 137 04/12/2021 1118   K 5.0 04/12/2021 1118   CL 101 04/12/2021 1118   CO2 25 04/12/2021 1118   BUN 10 04/12/2021 1118   CREATININE 0.82 04/12/2021 1118      Component Value Date/Time   CALCIUM 8.9 04/12/2021 1118   ALKPHOS 90 04/12/2021 1118   AST 44 (H) 04/12/2021 1118   ALT 42 04/12/2021 1118   BILITOT 0.9 04/12/2021 1118       RADIOGRAPHIC STUDIES: NM PET  Image Restage (PS) Skull Base to Thigh (F-18 FDG)  Result Date: 05/15/2021 CLINICAL DATA:  Subsequent treatment strategy for non-small cell lung cancer. EXAM: NUCLEAR MEDICINE PET SKULL BASE TO THIGH TECHNIQUE: 9.4 mCi F-18 FDG was injected intravenously. Full-ring PET imaging was performed from the skull base to thigh after the radiotracer. CT data was obtained and used for attenuation correction and anatomic localization. Fasting blood glucose: 110 mg/dl COMPARISON:  Chest CT 04/12/2021.  PET-CT 10/29/2018. FINDINGS: Mediastinal blood pool activity: SUV max 2.7 Liver activity: SUV max NA NECK: No hypermetabolic lymph nodes in the neck. Incidental CT findings: none CHEST: As on 04/12/2021, there is near complete collapse of the right middle lobe without substantial interval change. PET imaging shows no hypermetabolic lesion in the central right lung/hilar region with only low level (blood pool) activity identified in the right hilum and right middle lobe collapse. No evidence for hypermetabolic lymphadenopathy elsewhere in the chest. Incidental CT findings: Stable moderate to large right effusion with stable tiny left pleural effusion. ABDOMEN/PELVIS: No abnormal hypermetabolic activity within the liver, pancreas, adrenal glands, or spleen. No hypermetabolic lymph nodes in the abdomen or pelvis. Incidental CT findings: Focal low attenuation in the left liver along  the falciform ligament is in a characteristic location for focal fatty deposition. Additional low-attenuation in the posterior lateral segment left liver and in segment 4 is stable since 04/12/2021. No hypermetabolism in this region and findings likely represent areas of focal fatty deposition. Stable low-density right adrenal nodule consistent with adenoma. There is advanced atherosclerotic calcification of the abdominal aorta without aneurysm. Left colonic diverticulosis without diverticulitis. SKELETON: No focal hypermetabolic activity to suggest  skeletal metastasis. No worrisome lytic or sclerotic osseous abnormality. Incidental CT findings: none IMPRESSION: 1. No hypermetabolism in the central right lung/right hilum to suggest local recurrence. No other hypermetabolic disease in the chest. 2. No unexpected hypermetabolic disease in the neck, abdomen, or pelvis. 3. Similar appearance right middle lobe collapse with moderate to large right and small left pleural effusions comparing to CT scan 04/12/2021. 4. Areas of low attenuation in the liver parenchyma without hypermetabolism, likely reflecting focal/geographic fatty deposition. 5. Stable right adrenal adenoma. 6.  Emphysema. (ICD10-J43.9) 7.  Aortic Atherosclerois (ICD10-170.0) Electronically Signed   By: Misty Stanley M.D.   On: 05/15/2021 10:42    ASSESSMENT AND PLAN: This is a very pleasant 71 years old white male with stage IIIA non-small cell lung cancer, squamous cell carcinoma.  The patient underwent a course of concurrent chemoradiation with weekly carboplatin and paclitaxel status post 7 cycles.  The patient tolerated his treatment well except for mild fatigue and odynophagia. The patient completed a course of consolidation treatment with immunotherapy with Imfinzi every 2 weeks status post 26 cycles.  He tolerated his treatment well with no concerning adverse effects. The patient is currently on observation and he is feeling fine today with no concerning complaints except for the cough and shortness of breath with exertion.  He had a PET scan performed that showed no hypermetabolism in the central right lung/right hilum to suggest disease recurrence.  He has no other unexpected hypermetabolic disease in the neck, abdomen or pelvis but he has moderate to large right and small left pleural effusion. I recommended for the patient to proceed with ultrasound-guided right thoracentesis for drainage of the right pleural effusion.  We will send the fluid for cytologic evaluation. If the fluid is  negative, I will see the patient back for follow-up visit in 6 months for evaluation and repeat CT scan of the chest. He was advised to call immediately if he has any other concerning symptoms in the interval. For the history of atrial fibrillation, he will continue his current treatment with Eliquis.  The patient voices understanding of current disease status and treatment options and is in agreement with the current care plan. All questions were answered. The patient knows to call the clinic with any problems, questions or concerns. We can certainly see the patient much sooner if necessary.  Disclaimer: This note was dictated with voice recognition software. Similar sounding words can inadvertently be transcribed and may not be corrected upon review.

## 2021-05-22 ENCOUNTER — Ambulatory Visit (HOSPITAL_COMMUNITY): Admission: RE | Admit: 2021-05-22 | Payer: Medicare HMO | Source: Ambulatory Visit

## 2021-05-24 DIAGNOSIS — E039 Hypothyroidism, unspecified: Secondary | ICD-10-CM | POA: Diagnosis not present

## 2021-05-28 ENCOUNTER — Ambulatory Visit (HOSPITAL_COMMUNITY)
Admission: RE | Admit: 2021-05-28 | Discharge: 2021-05-28 | Disposition: A | Discharge status: Home / Self Care | Payer: Medicare HMO | Source: Ambulatory Visit | Attending: Internal Medicine | Admitting: Internal Medicine

## 2021-05-28 ENCOUNTER — Ambulatory Visit (HOSPITAL_COMMUNITY)
Admission: RE | Admit: 2021-05-28 | Discharge: 2021-05-28 | Disposition: A | Discharge status: Home / Self Care | Payer: Medicare HMO | Source: Ambulatory Visit | Attending: Student | Admitting: Student

## 2021-05-28 ENCOUNTER — Other Ambulatory Visit: Payer: Self-pay

## 2021-05-28 DIAGNOSIS — Z9889 Other specified postprocedural states: Secondary | ICD-10-CM | POA: Insufficient documentation

## 2021-05-28 DIAGNOSIS — Z48813 Encounter for surgical aftercare following surgery on the respiratory system: Secondary | ICD-10-CM | POA: Diagnosis not present

## 2021-05-28 DIAGNOSIS — J90 Pleural effusion, not elsewhere classified: Secondary | ICD-10-CM | POA: Diagnosis not present

## 2021-05-28 DIAGNOSIS — J9811 Atelectasis: Secondary | ICD-10-CM | POA: Diagnosis not present

## 2021-05-28 DIAGNOSIS — C3490 Malignant neoplasm of unspecified part of unspecified bronchus or lung: Secondary | ICD-10-CM | POA: Diagnosis present

## 2021-05-28 DIAGNOSIS — J948 Other specified pleural conditions: Secondary | ICD-10-CM | POA: Diagnosis not present

## 2021-05-28 MED ORDER — LIDOCAINE HCL 1 % IJ SOLN
INTRAMUSCULAR | Status: AC
  Administered 2021-05-28: 10 mL
  Filled 2021-05-28: qty 20

## 2021-05-28 NOTE — Procedures (Signed)
PROCEDURE SUMMARY:  Successful US guided right thoracentesis. Yielded 1.2 L of clear yellow fluid. Pt tolerated procedure well. No immediate complications.  Specimen sent for labs. CXR ordered; no post-procedure pneumothorax identified.   EBL < 2 mL  Theresa Duty, NP 05/28/2021 2:37 PM

## 2021-05-29 LAB — CYTOLOGY - NON PAP

## 2021-06-04 ENCOUNTER — Ambulatory Visit: Payer: Medicare HMO | Admitting: Pulmonary Disease

## 2021-06-06 ENCOUNTER — Other Ambulatory Visit: Payer: Self-pay

## 2021-06-06 ENCOUNTER — Ambulatory Visit: Payer: Medicare HMO | Admitting: Pulmonary Disease

## 2021-06-06 ENCOUNTER — Encounter: Payer: Self-pay | Admitting: Pulmonary Disease

## 2021-06-06 ENCOUNTER — Ambulatory Visit (INDEPENDENT_AMBULATORY_CARE_PROVIDER_SITE_OTHER): Payer: Medicare HMO

## 2021-06-06 VITALS — BP 122/72 | HR 73 | Ht 73.0 in | Wt 192.0 lb

## 2021-06-06 DIAGNOSIS — R053 Chronic cough: Secondary | ICD-10-CM

## 2021-06-06 DIAGNOSIS — R058 Other specified cough: Secondary | ICD-10-CM

## 2021-06-06 DIAGNOSIS — J90 Pleural effusion, not elsewhere classified: Secondary | ICD-10-CM

## 2021-06-06 DIAGNOSIS — J432 Centrilobular emphysema: Secondary | ICD-10-CM | POA: Diagnosis not present

## 2021-06-06 MED ORDER — AZITHROMYCIN 250 MG PO TABS: ORAL_TABLET | ORAL | 0 refills | Status: AC

## 2021-06-06 MED ORDER — TRELEGY ELLIPTA 100-62.5-25 MCG/ACT IN AEPB: 1.0000 | INHALATION_SPRAY | Freq: Every day | RESPIRATORY_TRACT | 0 refills | Status: DC

## 2021-06-06 MED ORDER — TRELEGY ELLIPTA 100-62.5-25 MCG/ACT IN AEPB: 1.0000 | INHALATION_SPRAY | Freq: Every day | RESPIRATORY_TRACT | 5 refills | Status: DC

## 2021-06-06 NOTE — Addendum Note (Signed)
Addended by: Valerie Salts on: 06/06/2021 12:02 PM   Modules accepted: Orders

## 2021-06-06 NOTE — Progress Notes (Signed)
Patient seen in the office today and instructed on use of Trelegy 100.  Patient expressed understanding and demonstrated technique.  Benetta Spar [facility] 06/06/2021

## 2021-06-06 NOTE — Patient Instructions (Signed)
Zithromax 250 mg pill >> 2 pills on day 1, then 1 pill daily for next 4 days  Trelegy one puff daily, and rinse your mouth after each use  Chest xray today  Follow up in 6 weeks

## 2021-06-06 NOTE — Progress Notes (Signed)
[facility]  Chief Complaint  Patient presents with   Follow-up    F/U on cough. States he is not able to cough up much phlegm. Also has some bilateral ankle swelling.     Constitutional:  BP 122/72    Pulse 73    Ht 6\' 1"  (1.854 m)    Wt 192 lb (87.1 kg)    SpO2 96% Comment: on RA   BMI 25.33 kg/m   Past Medical History:  Arthritis, Hypertension, NSCLC cancer dx July 2020, A fib  Past Surgical History:  He  has a past surgical history that includes Knee arthroscopy; Toe Surgery; Tonsillectomy; Total knee arthroplasty (Left, 08/19/2017); Video bronchoscopy (Bilateral, 11/05/2018); Video bronchoscopy (Bilateral, 11/09/2018); and Cardioversion (N/A, 01/11/2019).  Brief Summary:  Joe Cole is a 71 y.o. male former smoker with COPD/emphysema.       Subjective:   He is here with his wife.  He has noticed more cough and chest congestion for the past couple months.  Gets winded more easily.  Has intermittent wheezing and chest tightness.  Brings up clear sputum.  Found to have Rt pleural effusion.  Had thoracentesis with IR.  Cytology negative.  He didn't notice any significant improvement with his breathing.  No fever.  He has persistent swelling in his Rt ankle >> has been present for at least 1 year.  Physical Exam:   Appearance - well kempt   ENMT - no sinus tenderness, no oral exudate, no LAN, Mallampati 3 airway, no stridor  Respiratory - b/l rhonchi that clear with coughing  CV - s1s2 regular rate and rhythm, no murmurs  Ext - no clubbing, no edema  Skin - no rashes  Psych - normal mood and affect    Pulmonary testing:  PFT 05/04/18 >> FEV1 2.35 (64%), FEV1% 59, TLC 9.00 (121%), RV 4.89 (197%), DLCO 60% A1AT level 05/04/18 >> 167, MM Rt thoracentesis 05/28/21 >> 1.2 liters yellow fluid, cytology negative  Chest Imaging:  CT angio chest 10/20/18 >> Rt hilar LAN 2.2 cm, centrilobular and paraseptal emphysema, mass Rt  perihilar region PET scan 4/48/18 >> hypermetabolic Rt hila with postobstructive collapse of RML, nodule RML, small Rt effusion CT chest 10/15/19 >> small Rt effusion, moderate centrilobular and paraseptal emphysema, changes of XRT Rt perihilar area CT chest 04/13/21 >> near complete collapse of RML, mod Rt effusion, changes of emphysema PET scan 05/15/21 >> collapse of RML, mod Rt effusion  Cardiac Tests:  Echo 11/06/18 >> EF 50 to 55%, severe LA/RA dilation  Social History:  He  reports that he quit smoking about 2 years ago. His smoking use included cigarettes. He has a 69.00 pack-year smoking history. He has never used smokeless tobacco. He reports current alcohol use of about 15.0 - 16.0 standard drinks per week. He reports that he does not use drugs.  Family History:  His family history includes Atrial fibrillation in his mother; Emphysema in his father; Pneumonia in his father.     Assessment/Plan:   COPD with emphysema. - anoro was ineffective - he has progressive symptoms - will give course of zithromax - repeat chest xray today - will have him try trelegy 100 one puff daily; sample given and demonstrated inhaler technique  Upper airway cough syndrome. - prn flonase  Rt pleural effusion. - cytology negative from thoracentesis on 05/28/21 - repeat chest xray today   Stage 3 NSCLC (squamous cell). - s/p chemotherapy with carboplatin, paclitaxel -  started consolidation immunotherapy with imfinzi 02/08/19 - followed by Dr. Curt Bears with [facility]   Persistent A fib. - followed by Dr. Candee Furbish with [facility]  Time Spent Involved in Patient Care on Day of Examination:  37 minutes  Follow up:   Patient Instructions  Zithromax 250 mg pill >> 2 pills on day 1, then 1 pill daily for next 4 days  Trelegy one puff daily, and rinse your mouth after each use  Chest xray today  Follow up in 6 weeks  Medication List:   Allergies as of 06/06/2021    No Known Allergies      Medication List        Accurate as of June 06, 2021 11:49 AM. If you have any questions, ask your nurse or doctor.          STOP taking these medications    prochlorperazine 10 MG tablet Commonly known as: COMPAZINE Stopped by: [name], MD       TAKE these medications    apixaban 5 MG Tabs tablet Commonly known as: Eliquis Take 1 tablet (5 mg total) by mouth 2 (two) times daily.   atorvastatin 20 MG tablet Commonly known as: LIPITOR Take 20 mg by mouth daily.   azithromycin 250 MG tablet Commonly known as: ZITHROMAX Take 2 tablets (500 mg total) by mouth daily in the afternoon for 1 day, THEN 1 tablet (250 mg total) daily in the afternoon for 4 days. Start taking on: June 06, 2021 Started by: [name], MD   diltiazem 360 MG 24 hr capsule Commonly known as: CARDIZEM CD Take 1 capsule (360 mg total) by mouth daily. Please keep upcoming appt in January 2023 with Dr. Marlou Porch before anymore refills. Thank you   levothyroxine 88 MCG tablet Commonly known as: SYNTHROID Take 88 mcg by mouth daily.   metoprolol succinate 100 MG 24 hr tablet Commonly known as: TOPROL-XL Take 1 tablet (100 mg total) by mouth daily. Take with or immediately following a meal.   traZODone 50 MG tablet Commonly known as: DESYREL TAKE 1 TABLET BY MOUTH EVERYDAY AT BEDTIME   Trelegy Ellipta 100-62.5-25 MCG/ACT Aepb Generic drug: Fluticasone-Umeclidin-Vilant Inhale 1 puff into the lungs daily in the afternoon. Started by: [name], MD        Signature:  [name], MD [place] Pager - [phone] 06/06/2021, 11:49 AM

## 2021-06-14 ENCOUNTER — Telehealth: Payer: Self-pay | Admitting: Pulmonary Disease

## 2021-06-15 NOTE — Telephone Encounter (Signed)
Lm x1 for patient's spouse, Janet(DPR)

## 2021-06-15 NOTE — Telephone Encounter (Signed)
Joe Mires, MD  06/14/2021  1:22 PM EST     Please let her know her chest xray shows similar findings of right middle lung collapse that was seen on her CT chest in December 2022 and PET scan in January 2023.  She had slight increase in fluid around her right lung.  She is likely building up fluid to fill up the space in her chest from the right middle lung collapse.  If she is not having any respiratory symptoms, then nothing additional needed at this time and can discuss in more detail at her next ROV which needs to be scheduled for mid-March.     Called and spoke with pt's spouse letting her know the results of pt's cxr and she verbalized understanding. [name] stated that pt is still coughing his head off so I suggested that we get pt in for a sooner appt with one of our APPs to further assess and she verbalized understanding. Appt has been scheduled. Nothing further needed.

## 2021-06-21 ENCOUNTER — Ambulatory Visit: Payer: Medicare HMO | Admitting: Nurse Practitioner

## 2021-07-04 ENCOUNTER — Other Ambulatory Visit: Payer: Self-pay | Admitting: Cardiology

## 2021-07-24 DIAGNOSIS — E785 Hyperlipidemia, unspecified: Secondary | ICD-10-CM | POA: Diagnosis not present

## 2021-07-24 DIAGNOSIS — I10 Essential (primary) hypertension: Secondary | ICD-10-CM | POA: Diagnosis not present

## 2021-09-12 ENCOUNTER — Telehealth: Payer: Self-pay | Admitting: Pulmonary Disease

## 2021-09-13 NOTE — Telephone Encounter (Signed)
Can we ask pharmacy team to do a benefits check?

## 2021-09-13 NOTE — Telephone Encounter (Signed)
Please check with our pharmacy team, not the patient's pharmacist.

## 2021-09-13 NOTE — Telephone Encounter (Signed)
Called and spoke with patients wife Joe Cole. Joe Cole states that the trelegy works really good for the patient but it is too expensive for them every month. Patient wants to know if something else could be called in.   VS, can you advise?

## 2021-09-13 NOTE — Telephone Encounter (Signed)
Called and spoke with [name]. She stated that she has already tried to see if she could get assistance but they told her that they make too much for assistance.

## 2021-09-14 ENCOUNTER — Other Ambulatory Visit (HOSPITAL_COMMUNITY): Payer: Self-pay

## 2021-09-14 NOTE — Telephone Encounter (Signed)
Test billing results for [employer]/L[employer]/LAMA are as follows. Please note prices quote are fluid as patient is in his Coverage Gap (donut hole ~$800 balance) :  Trelegy: unable to get copay because it's filled at pharmacy [facility] returns a copay of $169.17  Until patient pays down his coverage gap, higher copays will be expected.

## 2021-09-14 NOTE — Telephone Encounter (Signed)
Called and spoke with Joe Cole. She verbalized understanding. Joe Cole also stated that they will leave it like it is.  Nothing further needed.

## 2021-09-26 DIAGNOSIS — M545 Low back pain, unspecified: Secondary | ICD-10-CM | POA: Diagnosis not present

## 2021-09-26 DIAGNOSIS — M25561 Pain in right knee: Secondary | ICD-10-CM | POA: Diagnosis not present

## 2021-10-01 ENCOUNTER — Ambulatory Visit (HOSPITAL_BASED_OUTPATIENT_CLINIC_OR_DEPARTMENT_OTHER): Payer: Medicare HMO | Admitting: Family

## 2021-10-03 ENCOUNTER — Ambulatory Visit: Payer: Medicare HMO | Admitting: Cardiology

## 2021-10-05 ENCOUNTER — Ambulatory Visit (HOSPITAL_BASED_OUTPATIENT_CLINIC_OR_DEPARTMENT_OTHER): Payer: Medicare HMO | Admitting: Family

## 2021-10-16 ENCOUNTER — Telehealth: Payer: Self-pay | Admitting: Internal Medicine

## 2021-10-16 NOTE — Telephone Encounter (Signed)
Called patient regarding upcoming July appointments, voicemail was left.

## 2021-10-16 NOTE — Telephone Encounter (Signed)
Called patient regarding upcoming July appointments, patient has been called and voicemail was left.

## 2021-10-23 NOTE — Progress Notes (Signed)
Cardiology Office Note    Date:  11/06/2021   ID:  Joe Cole, DOB 1950/09/03, MRN [id number]   PCP:  [name], MD   [facility]  Cardiologist:  Candee Furbish, MD   Advanced Practice Provider:  No care team member to display Electrophysiologist:  None   [phone]   Chief Complaint  Patient presents with   Leg Swelling    History of Present Illness:  Joe Cole is a 71 y.o. male with history of permanent Afib on eliquis, HLD, Has stage III non-small cell lung cancer diagnosed in July 2020 post chemotherapy with radiation, COPD.   Patient last saw Dr. Marlou Porch 05/07/21 with decreased energy but otherwise stable.   He had X-rays of knee and spine and was told he had a calcified aorta and ortho was concerned about right leg swelling which has been chronic for the past 3 years. Chronic DOE unchanged. No chest pain. For CT chest today and oncology f/u. He denies claudication symptoms but is limited with his breathing. No injury to right leg. They try to limit salt. No pain in leg, swelling goes down at night.  Past Medical History:  Diagnosis Date   Arthritis    Atrial fibrillation ([place]) 10/2018   COPD with emphysema ([place])    Dyspnea    Hypertension    lung ca dx'd 10/2018    Past Surgical History:  Procedure Laterality Date   CARDIOVERSION N/A 01/11/2019   Procedure: CARDIOVERSION;  Surgeon: Josue Hector, MD;  Location: [facility] ENDOSCOPY;  Service: Cardiovascular;  Laterality: N/A;   KNEE ARTHROSCOPY     BIL    TOE SURGERY     PINNED LEFT BIG TOE   TONSILLECTOMY     T+A  AS CHILD   TOTAL KNEE ARTHROPLASTY Left 08/19/2017   Procedure: TOTAL KNEE ARTHROPLASTY;  Surgeon: Renette Butters, MD;  Location: [place];  Service: Orthopedics;  Laterality: Left;   VIDEO BRONCHOSCOPY Bilateral 11/05/2018   Procedure: VIDEO BRONCHOSCOPY WITH FLUORO;  Surgeon: [name], MD;  Location: [place];  Service: Cardiopulmonary;  Laterality: Bilateral;    VIDEO BRONCHOSCOPY Bilateral 11/09/2018   Procedure: VIDEO BRONCHOSCOPY WITH FLUORO;  Surgeon: Rigoberto Noel, MD;  Location: [place];  Service: Cardiopulmonary;  Laterality: Bilateral;    Current Medications: Current Meds  Medication Sig   atorvastatin (LIPITOR) 20 MG tablet Take 20 mg by mouth daily.   diltiazem (CARDIZEM CD) 360 MG 24 hr capsule Take 1 capsule (360 mg total) by mouth daily.   ELIQUIS 5 MG TABS tablet TAKE ONE TABLET BY MOUTH TWICE DAILY   Fluticasone-Umeclidin-Vilant (TRELEGY ELLIPTA) 100-62.5-25 MCG/ACT AEPB Inhale 1 puff into the lungs daily in the afternoon.   Fluticasone-Umeclidin-Vilant (TRELEGY ELLIPTA) 100-62.5-25 MCG/ACT AEPB Inhale 1 puff into the lungs daily.   furosemide (LASIX) 20 MG tablet Take 1 tablet (20 mg total) by mouth daily as needed.   levothyroxine (SYNTHROID) 88 MCG tablet Take 88 mcg by mouth daily.   metoprolol succinate (TOPROL-XL) 100 MG 24 hr tablet TAKE ONE TABLET BY MOUTH ONCE DAILY WITH OR immediately following A meal   potassium chloride (KLOR-CON M) 10 MEQ tablet Take 1 tablet (10 mEq total) by mouth daily as needed.   traZODone (DESYREL) 50 MG tablet TAKE 1 TABLET BY MOUTH EVERYDAY AT BEDTIME     Allergies:   Patient has no known allergies.   Social History   Socioeconomic History   Marital status: Married  Spouse name: Not on file   Number of children: Not on file   Years of education: Not on file   Highest education level: Not on file  Occupational History   Not on file  Tobacco Use   Smoking status: Former    Packs/day: 1.50    Years: 46.00    Total pack years: 69.00    Types: Cigarettes    Quit date: 09/08/2018    Years since quitting: 3.1   Smokeless tobacco: Never  Vaping Use   Vaping Use: Never used  Substance and Sexual Activity   Alcohol use: Yes    Alcohol/week: 15.0 - 16.0 standard drinks of alcohol    Types: 1 - 2 Cans of beer, 12 Shots of liquor, 2 Standard drinks or equivalent per week    Comment:  FEW DRINKS AFTER WORK   Drug use: Never   Sexual activity: Not on file  Other Topics Concern   Not on file  Social History Narrative   Not on file   Social Determinants of Health   Financial Resource Strain: Not on file  Food Insecurity: Not on file  Transportation Needs: Not on file  Physical Activity: Not on file  Stress: Not on file  Social Connections: Not on file     Family History:  The patient's  family history includes Atrial fibrillation in his mother; Emphysema in his father; Pneumonia in his father.   ROS:   Please see the history of present illness.    ROS All other systems reviewed and are negative.   PHYSICAL EXAM:   VS:  BP 116/80   Pulse 89   Ht 6\' 1"  (1.854 m)   Wt 188 lb (85.3 kg)   SpO2 99%   BMI 24.80 kg/m   Physical Exam  GEN: Well nourished, well developed, in no acute distress  Neck: no JVD, carotid bruits, or masses Cardiac:irreg irreg; no murmurs, rubs, or gallops  Respiratory:  decrease BS throughout GI: soft, nontender, nondistended, + BS Ext: right lower ext edema. Can't palpate distal pulses. Good femoral pulses Neuro:  Alert and Oriented x 3, Psych: euthymic mood, full affect  Wt Readings from Last 3 Encounters:  11/06/21 188 lb (85.3 kg)  06/06/21 192 lb (87.1 kg)  05/17/21 193 lb 11.2 oz (87.9 kg)      Studies/Labs Reviewed:   EKG:  EKG is  ordered today.  The ekg ordered today demonstrates Afib nonspecific ST changes  Recent Labs: 04/12/2021: ALT 42; BUN 10; Creatinine 0.82; Hemoglobin 13.7; Platelet Count 152; Potassium 5.0; Sodium 137   Lipid Panel No results found for: "CHOL", "TRIG", "HDL", "CHOLHDL", "VLDL", "LDLCALC", "LDLDIRECT"  Additional studies/ records that were reviewed today include:  Echo 11/06/18 IMPRESSIONS     1. The left ventricle has low normal systolic function, with an ejection  fraction of 50-55%. The cavity size was mildly dilated. Left ventricular  diastolic Doppler parameters are  indeterminate.   2. Extremely poor acoustic windows limit study.   3. The right ventricle was not well visualized. The cavity was moderately  enlarged. There is not assessed.   4. RV is not well seen RVEF is at least mildly depressed.   5. Left atrial size was severely dilated.   6. Right atrial size was severely dilated.   7. The mitral valve is abnormal. Mild thickening of the mitral valve  leaflet. There is mild mitral annular calcification present.   8. The tricuspid valve is grossly normal.   9.  The aortic valve is abnormal. Mild thickening of the aortic valve.  Mild calcification of the aortic valve. Aortic valve regurgitation is  trivial by color flow Doppler.  10. The inferior vena cava was dilated in size with <50% respiratory  variability.     Assessment/Calculations:    CHA2DS2-VASc Score = 3   This indicates a 3.2% annual risk of stroke. The patient's score is based upon: CHF History: 0 HTN History: 1 Diabetes History: 0 Stroke History: 0 Vascular Disease History: 1 Age Score: 1 Gender Score: 0        ASSESSMENT:    1. Aortic atherosclerosis ([place])   2. Lower extremity edema   3. Persistent atrial fibrillation ([place])   4. Pure hypercholesterolemia   5. Stage III squamous cell carcinoma of right lung ([place])   6. Shortness of breath   7. COPD mixed type ([place])      PLAN:  In order of problems listed above:  Aortic atherosclerosis noted on Xray of knee and spine. PET scan in 04/2021 showed the same and no aneurysm. CT chest no aneurysm of thoracic aorta in 09/2020 and for repeat today. Offered coronary calcium score which I suspect is high but he is already on a statin. No ASA with eliquis. They will think about.  RLE edema chronic for > 3 yrs. Is getting extra salt in diet. No injury, pain. Check labs including BNP, update echo and check LE arterial dopplers. Don't think this is DVT and he's on eliquis. Add low dose lasix 20 mg daily for 3 days then prn, K 10  meq 3 days then prn  Persistent Afib on eliquis and Torpol XL-rate controlled  HLD on lipitor, LDL 74 6/  Stage 3 squamous cell CA of right lung  COPD/chronic DOE  Shared Decision Making/Informed Consent        Medication Adjustments/Labs and Tests Ordered: Current medicines are reviewed at length with the patient today.  Concerns regarding medicines are outlined above.  Medication changes, Labs and Tests ordered today are listed in the Patient Instructions below. Patient Instructions  Medication Instructions:  Your physician has recommended you make the following change in your medication:   START: Furosemide 20mg  daily for 3 days then take as needed START: Potassium 59meq daily for 3 days then take as needed with Furosemide  *If you need a refill on your cardiac medications before your next appointment, please call your pharmacy*   Lab Work: TODAY: CMET, CBC, TSH, FLP, BNP  If you have labs (blood work) drawn today and your tests are completely normal, you will receive your results only by: [place] (if you have MyChart) OR A paper copy in the mail If you have any lab test that is abnormal or we need to change your treatment, we will call you to review the results.   Testing/Procedures: Your physician has requested that you have an echocardiogram. Echocardiography is a painless test that uses sound waves to create images of your heart. It provides your doctor with information about the size and shape of your heart and how well your heart's chambers and valves are working. This procedure takes approximately one hour. There are no restrictions for this procedure.  Your physician has requested that you have a lower extremity arterial exercise duplex. During this test, exercise and ultrasound are used to evaluate arterial blood flow in the legs. Allow one hour for this exam. There are no restrictions or special instructions.    Follow-Up: At [facility],  you and your  health needs are our priority.  As part of our continuing mission to provide you with exceptional heart care, we have created designated Provider Care Teams.  These Care Teams include your primary Cardiologist (physician) and Advanced Practice Providers (APPs -  Physician Assistants and Nurse Practitioners) who all work together to provide you with the care you need, when you need it.  We recommend signing up for the patient portal called "MyChart".  Sign up information is provided on this After Visit Summary.  MyChart is used to connect with patients for Virtual Visits (Telemedicine).  Patients are able to view lab/test results, encounter notes, upcoming appointments, etc.  Non-urgent messages can be sent to your provider as well.   To learn more about what you can do with MyChart, go to [email or web address].    Your next appointment:    }   Other Instructions  Two Gram Sodium Diet 2000 mg  What is Sodium? Sodium is a mineral found naturally in many foods. The most significant source of sodium in the diet is table salt, which is about 40% sodium.  Processed, convenience, and preserved foods also contain a large amount of sodium.  The body needs only 500 mg of sodium daily to function,  A normal diet provides more than enough sodium even if you do not use salt.  Why Limit Sodium? A build up of sodium in the body can cause thirst, increased blood pressure, shortness of breath, and water retention.  Decreasing sodium in the diet can reduce edema and risk of heart attack or stroke associated with high blood pressure.  Keep in mind that there are many other factors involved in these health problems.  Heredity, obesity, lack of exercise, cigarette smoking, stress and what you eat all play a role.  General Guidelines: Do not add salt at the table or in cooking.  One teaspoon of salt contains over 2 grams of sodium. Read food labels Avoid processed and convenience foods Ask your dietitian before  eating any foods not dicussed in the menu planning guidelines Consult your physician if you wish to use a salt substitute or a sodium containing medication such as antacids.  Limit milk and milk products to 16 oz (2 cups) per day.  Shopping Hints: READ LABELS!! "Dietetic" does not necessarily mean low sodium. Salt and other sodium ingredients are often added to foods during processing.    Menu Planning Guidelines Food Group Choose More Often Avoid  Beverages (see also the milk group All fruit juices, low-sodium, salt-free vegetables juices, low-sodium carbonated beverages Regular vegetable or tomato juices, commercially softened water used for drinking or cooking  Breads and Cereals Enriched white, wheat, rye and pumpernickel bread, hard rolls and dinner rolls; muffins, cornbread and waffles; most dry cereals, cooked cereal without added salt; unsalted crackers and breadsticks; low sodium or homemade bread crumbs Bread, rolls and crackers with salted tops; quick breads; instant hot cereals; pancakes; commercial bread stuffing; self-rising flower and biscuit mixes; regular bread crumbs or cracker crumbs  Desserts and Sweets Desserts and sweets mad with mild should be within allowance Instant pudding mixes and cake mixes  Fats Butter or margarine; vegetable oils; unsalted salad dressings, regular salad dressings limited to 1 Tbs; light, sour and heavy cream Regular salad dressings containing bacon fat, bacon bits, and salt pork; snack dips made with instant soup mixes or processed cheese; salted nuts  Fruits Most fresh, frozen and canned fruits Fruits processed with salt or sodium-containing ingredient (some  dried fruits are processed with sodium sulfites        Vegetables Fresh, frozen vegetables and low- sodium canned vegetables Regular canned vegetables, sauerkraut, pickled vegetables, and others prepared in brine; frozen vegetables in sauces; vegetables seasoned with ham, bacon or salt pork   Condiments, Sauces, Miscellaneous  Salt substitute with physician's approval; pepper, herbs, spices; vinegar, lemon or lime juice; hot pepper sauce; garlic powder, onion powder, low sodium soy sauce (1 Tbs.); low sodium condiments (ketchup, chili sauce, mustard) in limited amounts (1 tsp.) fresh ground horseradish; unsalted tortilla chips, pretzels, potato chips, popcorn, salsa (1/4 cup) Any seasoning made with salt including garlic salt, celery salt, onion salt, and seasoned salt; sea salt, rock salt, kosher salt; meat tenderizers; monosodium glutamate; mustard, regular soy sauce, barbecue, sauce, chili sauce, teriyaki sauce, steak sauce, Worcestershire sauce, and most flavored vinegars; canned gravy and mixes; regular condiments; salted snack foods, olives, picles, relish, horseradish sauce, catsup   Food preparation: Try these seasonings Meats:    Pork Sage, onion Serve with applesauce  Chicken Poultry seasoning, thyme, parsley Serve with cranberry sauce  Lamb Curry powder, rosemary, garlic, thyme Serve with mint sauce or jelly  Veal Marjoram, basil Serve with current jelly, cranberry sauce  Beef Pepper, bay leaf Serve with dry mustard, unsalted chive butter  Fish [place] leaf, dill Serve with unsalted lemon butter, unsalted parsley butter  Vegetables:    Asparagus Lemon juice   Broccoli Lemon juice   Carrots Mustard dressing parsley, mint, nutmeg, glazed with unsalted butter and sugar   Green beans Marjoram, lemon juice, nutmeg,dill seed   Tomatoes Basil, marjoram, onion   Spice /blend for [employer]" 4 tsp ground thyme 1 tsp ground sage 3 tsp ground rosemary 4 tsp ground marjoram   Test your knowledge A product that says "Salt Free" may still contain sodium. True or False Garlic Powder and Hot Pepper Sauce an be used as alternative seasonings.True or False Processed foods have more sodium than fresh foods.  True or False Canned Vegetables have less sodium than froze True or  False   WAYS TO DECREASE YOUR SODIUM INTAKE Avoid the use of added salt in cooking and at the table.  Table salt (and other prepared seasonings which contain salt) is probably one of the greatest sources of sodium in the diet.  Unsalted foods can gain flavor from the sweet, sour, and butter taste sensations of herbs and spices.  Instead of using salt for seasoning, try the following seasonings with the foods listed.  Remember: how you use them to enhance natural food flavors is limited only by your creativity... Allspice-Meat, fish, eggs, fruit, peas, red and yellow vegetables Almond Extract-Fruit baked goods Anise Seed-Sweet breads, fruit, carrots, beets, cottage cheese, cookies (tastes like licorice) Basil-Meat, fish, eggs, vegetables, rice, vegetables salads, soups, sauces Bay Leaf-Meat, fish, stews, poultry Burnet-Salad, vegetables (cucumber-like flavor) Caraway Seed-Bread, cookies, cottage cheese, meat, vegetables, cheese, rice Cardamon-Baked goods, fruit, soups Celery Powder or seed-Salads, salad dressings, sauces, meatloaf, soup, bread.Do not use  celery salt Chervil-Meats, salads, fish, eggs, vegetables, cottage cheese (parsley-like flavor) Chili Power-Meatloaf, chicken cheese, corn, eggplant, egg dishes Chives-Salads cottage cheese, egg dishes, soups, vegetables, sauces Cilantro-Salsa, casseroles Cinnamon-Baked goods, fruit, pork, lamb, chicken, carrots Cloves-Fruit, baked goods, fish, pot roast, green beans, beets, carrots Coriander-Pastry, cookies, meat, salads, cheese (lemon-orange flavor) Cumin-Meatloaf, fish,cheese, eggs, cabbage,fruit pie (caraway flavor) Curry Powder-Meat, fruit, eggs, fish, poultry, cottage cheese, vegetables Dill Seed-Meat, cottage cheese, poultry, vegetables, fish, salads, bread Fennel Seed-Bread, cookies, apples, pork,  eggs, fish, beets, cabbage, cheese, Licorice-like flavor Garlic-(buds or powder) Salads, meat, poultry, fish, bread, butter, vegetables,  potatoes.Do not  use garlic salt Ginger-Fruit, vegetables, baked goods, meat, fish, poultry Horseradish Root-Meet, vegetables, butter Lemon Juice or Extract-Vegetables, fruit, tea, baked goods, fish salads Mace-Baked goods fruit, vegetables, fish, poultry (taste like nutmeg) Maple Extract-Syrups Marjoram-Meat, chicken, fish, vegetables, breads, green salads (taste like Sage) Mint-Tea, lamb, sherbet, vegetables, desserts, carrots, cabbage Mustard, Dry or Seed-Cheese, eggs, meats, vegetables, poultry Nutmeg-Baked goods, fruit, chicken, eggs, vegetables, desserts Onion Powder-Meat, fish, poultry, vegetables, cheese, eggs, bread, rice salads (Do not use   Onion salt) Orange Extract-Desserts, baked goods Oregano-Pasta, eggs, cheese, onions, pork, lamb, fish, chicken, vegetables, green salads Paprika-Meat, fish, poultry, eggs, cheese, vegetables Parsley Flakes-Butter, vegetables, meat fish, poultry, eggs, bread, salads (certain forms may   Contain sodium Pepper-Meat fish, poultry, vegetables, eggs Peppermint Extract-Desserts, baked goods Poppy Seed-Eggs, bread, cheese, fruit dressings, baked goods, noodles, vegetables, cottage  [employer], poultry, meat, fish, cauliflower, turnips,eggs bread Saffron-Rice, bread, veal, chicken, fish, eggs Sage-Meat, fish, poultry, onions, eggplant, tomateos, pork, stews Savory-Eggs, salads, poultry, meat, rice, vegetables, soups, pork Tarragon-Meat, poultry, fish, eggs, butter, vegetables (licorice-like flavor)  Thyme-Meat, poultry, fish, eggs, vegetables, (clover-like flavor), sauces, soups Tumeric-Salads, butter, eggs, fish, rice, vegetables (saffron-like flavor) Vanilla Extract-Baked goods, candy Vinegar-Salads, vegetables, meat marinades Walnut Extract-baked goods, candy   2. Choose your Foods Wisely   The following is a list of foods to avoid which are high in sodium:  Meats-Avoid all smoked, canned, salt  cured, dried and kosher meat and fish as well as Anchovies   [employer] meats:Bologna, Liverwurst, Pastrami Canned meat or fish  Marinated herring Caviar    Pepperoni Corned Beef   Pizza Dried chipped beef  Salami Frozen breaded fish or meat Salt pork Frankfurters or hot dogs  Sardines Gefilte fish   Sausage Ham (boiled ham, Proscuitto Smoked butt    spiced ham)   Spam      TV Dinners Vegetables Canned vegetables (Regular) Relish Canned mushrooms  Sauerkraut Olives    Tomato juice Pickles  Bakery and Dessert Products Canned puddings  Cream pies Cheesecake   Decorated cakes Cookies  Beverages/Juices Tomato juice, regular  Gatorade   V-8 vegetable juice, regular  Breads and Cereals Biscuit mixes   Salted potato chips, corn chips, pretzels Bread stuffing mixes  Salted crackers and rolls Pancake and waffle mixes Self-rising flour  Seasonings Accent    Meat sauces Barbecue sauce  Meat tenderizer Catsup    Monosodium glutamate (MSG) Celery salt   Onion salt Chili sauce   Prepared mustard Garlic salt   Salt, seasoned salt, sea salt Gravy mixes   Soy sauce Horseradish   Steak sauce Ketchup   Tartar sauce Lite salt    Teriyaki sauce Marinade mixes   Worcestershire sauce  Others Baking powder   Cocoa and cocoa mixes Baking soda   Commercial casserole mixes Candy-caramels, chocolate  Dehydrated soups    Bars, fudge,nougats  Instant rice and pasta mixes Canned broth or soup  Maraschino cherries Cheese, aged and processed cheese and cheese spreads  Learning Assessment Quiz  Indicated T (for True) or F (for False) for each of the following statements:  _____ Fresh fruits and vegetables and unprocessed grains are generally low in sodium _____ Water may contain a considerable amount of sodium, depending on the source _____ You can always tell if a food is high in sodium by tasting it _____ Certain  laxatives my be high in sodium and should be avoided unless  prescribed   by a physician or pharmacist _____ Salt substitutes may be used freely by anyone on a sodium restricted diet _____ Sodium is present in table salt, food additives and as a natural component of   most foods _____ Table salt is approximately 90% sodium _____ Limiting sodium intake may help prevent excess fluid accumulation in the body _____ On a sodium-restricted diet, seasonings such as bouillon soy sauce, and    cooking wine should be used in place of table salt _____ On an ingredient list, a product which lists monosodium glutamate as the first   ingredient is an appropriate food to include on a low sodium diet  Circle the best answer(s) to the following statements (Hint: there may be more than one correct answer)  11. On a low-sodium diet, some acceptable snack items are:    A. Olives  F. Bean dip   K. Grapefruit juice    B. Salted Pretzels G. Commercial Popcorn   L. Canned peaches    C. Carrot Sticks  H. Bouillon   M. Unsalted nuts   D. [place] fries  I. Peanut butter crackers N. Salami   E. Sweet pickles J. Tomato Juice   O. Pizza  12.  Seasonings that may be used freely on a reduced - sodium diet include   A. Lemon wedges F.Monosodium glutamate K. Celery seed    B.Soysauce   G. Pepper   L. Mustard powder   C. Sea salt  H. Cooking wine  M. Onion flakes   D. Vinegar  E. Prepared horseradish N. Salsa   E. Sage   J. Worcestershire sauce  O. [address]     Sumner Boast, PA-C  11/06/2021 8:48 AM    [facility] [place], [place], [place]  [address] Phone: [phone]; Fax: [phone]

## 2021-10-28 ENCOUNTER — Other Ambulatory Visit: Payer: Self-pay | Admitting: Cardiology

## 2021-10-28 ENCOUNTER — Other Ambulatory Visit: Payer: Self-pay | Admitting: Internal Medicine

## 2021-10-28 DIAGNOSIS — I4891 Unspecified atrial fibrillation: Secondary | ICD-10-CM

## 2021-10-29 NOTE — Telephone Encounter (Signed)
Prescription refill request for Eliquis received. Indication: Atrial Fib Last office visit: 05/07/21  Derl Barrow MD Scr: 0.82 in 04/12/21 Age: 71 Weight: 87.1kg  Based on above findings Eliquis 5mg  twice daily is the appropriate dose.  Refill approved.

## 2021-11-05 ENCOUNTER — Inpatient Hospital Stay: Payer: Medicare HMO

## 2021-11-06 ENCOUNTER — Ambulatory Visit (HOSPITAL_COMMUNITY)
Admission: RE | Admit: 2021-11-06 | Discharge: 2021-11-06 | Disposition: A | Discharge status: Home / Self Care | Payer: Medicare HMO | Source: Ambulatory Visit | Attending: Internal Medicine | Admitting: Internal Medicine

## 2021-11-06 ENCOUNTER — Inpatient Hospital Stay: Payer: Medicare HMO | Attending: Internal Medicine

## 2021-11-06 ENCOUNTER — Other Ambulatory Visit: Payer: Self-pay

## 2021-11-06 ENCOUNTER — Encounter: Payer: Self-pay | Admitting: Physician Assistant

## 2021-11-06 ENCOUNTER — Encounter (HOSPITAL_COMMUNITY): Payer: Self-pay

## 2021-11-06 ENCOUNTER — Ambulatory Visit (INDEPENDENT_AMBULATORY_CARE_PROVIDER_SITE_OTHER): Payer: Medicare HMO | Admitting: Physician Assistant

## 2021-11-06 VITALS — BP 116/80 | HR 89 | Ht 73.0 in | Wt 188.0 lb

## 2021-11-06 DIAGNOSIS — I4819 Other persistent atrial fibrillation: Secondary | ICD-10-CM

## 2021-11-06 DIAGNOSIS — C3490 Malignant neoplasm of unspecified part of unspecified bronchus or lung: Secondary | ICD-10-CM | POA: Diagnosis not present

## 2021-11-06 DIAGNOSIS — I700 Atherosclerosis of aorta: Secondary | ICD-10-CM

## 2021-11-06 DIAGNOSIS — Z9221 Personal history of antineoplastic chemotherapy: Secondary | ICD-10-CM | POA: Insufficient documentation

## 2021-11-06 DIAGNOSIS — Z7901 Long term (current) use of anticoagulants: Secondary | ICD-10-CM | POA: Insufficient documentation

## 2021-11-06 DIAGNOSIS — I7090 Unspecified atherosclerosis: Secondary | ICD-10-CM | POA: Insufficient documentation

## 2021-11-06 DIAGNOSIS — E7800 Pure hypercholesterolemia, unspecified: Secondary | ICD-10-CM

## 2021-11-06 DIAGNOSIS — E039 Hypothyroidism, unspecified: Secondary | ICD-10-CM | POA: Diagnosis not present

## 2021-11-06 DIAGNOSIS — R600 Localized edema: Secondary | ICD-10-CM | POA: Diagnosis not present

## 2021-11-06 DIAGNOSIS — C3491 Malignant neoplasm of unspecified part of right bronchus or lung: Secondary | ICD-10-CM

## 2021-11-06 DIAGNOSIS — R0602 Shortness of breath: Secondary | ICD-10-CM | POA: Diagnosis not present

## 2021-11-06 DIAGNOSIS — I4891 Unspecified atrial fibrillation: Secondary | ICD-10-CM | POA: Insufficient documentation

## 2021-11-06 DIAGNOSIS — E785 Hyperlipidemia, unspecified: Secondary | ICD-10-CM | POA: Diagnosis not present

## 2021-11-06 DIAGNOSIS — Z85118 Personal history of other malignant neoplasm of bronchus and lung: Secondary | ICD-10-CM | POA: Insufficient documentation

## 2021-11-06 DIAGNOSIS — M7989 Other specified soft tissue disorders: Secondary | ICD-10-CM | POA: Insufficient documentation

## 2021-11-06 DIAGNOSIS — J449 Chronic obstructive pulmonary disease, unspecified: Secondary | ICD-10-CM

## 2021-11-06 DIAGNOSIS — J9811 Atelectasis: Secondary | ICD-10-CM | POA: Diagnosis not present

## 2021-11-06 DIAGNOSIS — I10 Essential (primary) hypertension: Secondary | ICD-10-CM | POA: Diagnosis not present

## 2021-11-06 LAB — CMP (CANCER CENTER ONLY)
ALT: 42 U/L (ref 0–44)
AST: 41 U/L (ref 15–41)
Albumin: 3.9 g/dL (ref 3.5–5.0)
Alkaline Phosphatase: 77 U/L (ref 38–126)
Anion gap: 7 (ref 5–15)
BUN: 11 mg/dL (ref 8–23)
CO2: 27 mmol/L (ref 22–32)
Calcium: 9.4 mg/dL (ref 8.9–10.3)
Chloride: 100 mmol/L (ref 98–111)
Creatinine: 0.79 mg/dL (ref 0.61–1.24)
GFR, Estimated: >60 mL/min (ref >60)
Glucose, Bld: 92 mg/dL (ref 70–99)
Potassium: 4.5 mmol/L (ref 3.5–5.1)
Sodium: 134 mmol/L — ABNORMAL LOW (ref 135–145)
Total Bilirubin: 1.2 mg/dL (ref 0.3–1.2)
Total Protein: 6.9 g/dL (ref 6.5–8.1)

## 2021-11-06 LAB — CBC WITH DIFFERENTIAL (CANCER CENTER ONLY)
Abs Immature Granulocytes: 0.03 10*3/uL (ref 0.00–0.07)
Basophils Absolute: 0.1 10*3/uL (ref 0.0–0.1)
Basophils Relative: 1 %
Eosinophils Absolute: 0.1 10*3/uL (ref 0.0–0.5)
Eosinophils Relative: 1 %
HCT: 36 % — ABNORMAL LOW (ref 39.0–52.0)
Hemoglobin: 13.2 g/dL (ref 13.0–17.0)
Immature Granulocytes: 1 %
Lymphocytes Relative: 18 %
Lymphs Abs: 1.1 10*3/uL (ref 0.7–4.0)
MCH: 43.4 pg — ABNORMAL HIGH (ref 26.0–34.0)
MCHC: 36.7 g/dL — ABNORMAL HIGH (ref 30.0–36.0)
MCV: 118.4 fL — ABNORMAL HIGH (ref 80.0–100.0)
Monocytes Absolute: 0.6 10*3/uL (ref 0.1–1.0)
Monocytes Relative: 10 %
Neutro Abs: 4.3 10*3/uL (ref 1.7–7.7)
Neutrophils Relative %: 69 %
Platelet Count: 117 10*3/uL — ABNORMAL LOW (ref 150–400)
RBC: 3.04 MIL/uL — ABNORMAL LOW (ref 4.22–5.81)
RDW: 14.9 % (ref 11.5–15.5)
WBC Count: 6.1 10*3/uL (ref 4.0–10.5)
nRBC: 0 % (ref 0.0–0.2)

## 2021-11-06 MED ORDER — POTASSIUM CHLORIDE CRYS ER 10 MEQ PO TBCR: 10.0000 meq | EXTENDED_RELEASE_TABLET | Freq: Every day | ORAL | 3 refills | Status: DC | PRN

## 2021-11-06 MED ORDER — SODIUM CHLORIDE (PF) 0.9 % IJ SOLN
INTRAMUSCULAR | Status: AC
  Filled 2021-11-06: qty 50

## 2021-11-06 MED ORDER — FUROSEMIDE 20 MG PO TABS: 20.0000 mg | ORAL_TABLET | Freq: Every day | ORAL | 3 refills | Status: DC | PRN

## 2021-11-06 MED ORDER — IOHEXOL 300 MG/ML  SOLN
75.0000 mL | Freq: Once | INTRAMUSCULAR | Status: AC | PRN
  Administered 2021-11-06: 75 mL via INTRAVENOUS

## 2021-11-06 NOTE — Patient Instructions (Signed)
Medication Instructions:  Your physician has recommended you make the following change in your medication:   START: Furosemide 20mg  daily for 3 days then take as needed START: Potassium 33meq daily for 3 days then take as needed with Furosemide  *If you need a refill on your cardiac medications before your next appointment, please call your pharmacy*   Lab Work: TODAY: CMET, CBC, TSH, FLP, BNP  If you have labs (blood work) drawn today and your tests are completely normal, you will receive your results only by: [place] (if you have MyChart) OR A paper copy in the mail If you have any lab test that is abnormal or we need to change your treatment, we will call you to review the results.   Testing/Procedures: Your physician has requested that you have an echocardiogram. Echocardiography is a painless test that uses sound waves to create images of your heart. It provides your doctor with information about the size and shape of your heart and how well your heart's chambers and valves are working. This procedure takes approximately one hour. There are no restrictions for this procedure.  Your physician has requested that you have a lower extremity arterial exercise duplex. During this test, exercise and ultrasound are used to evaluate arterial blood flow in the legs. Allow one hour for this exam. There are no restrictions or special instructions.    Follow-Up: At [facility], you and your health needs are our priority.  As part of our continuing mission to provide you with exceptional heart care, we have created designated Provider Care Teams.  These Care Teams include your primary Cardiologist (physician) and Advanced Practice Providers (APPs -  Physician Assistants and Nurse Practitioners) who all work together to provide you with the care you need, when you need it.  We recommend signing up for the patient portal called "MyChart".  Sign up information is provided on this After  Visit Summary.  MyChart is used to connect with patients for Virtual Visits (Telemedicine).  Patients are able to view lab/test results, encounter notes, upcoming appointments, etc.  Non-urgent messages can be sent to your provider as well.   To learn more about what you can do with MyChart, go to [email or web address].    Your next appointment:    }   Other Instructions  Two Gram Sodium Diet 2000 mg  What is Sodium? Sodium is a mineral found naturally in many foods. The most significant source of sodium in the diet is table salt, which is about 40% sodium.  Processed, convenience, and preserved foods also contain a large amount of sodium.  The body needs only 500 mg of sodium daily to function,  A normal diet provides more than enough sodium even if you do not use salt.  Why Limit Sodium? A build up of sodium in the body can cause thirst, increased blood pressure, shortness of breath, and water retention.  Decreasing sodium in the diet can reduce edema and risk of heart attack or stroke associated with high blood pressure.  Keep in mind that there are many other factors involved in these health problems.  Heredity, obesity, lack of exercise, cigarette smoking, stress and what you eat all play a role.  General Guidelines: Do not add salt at the table or in cooking.  One teaspoon of salt contains over 2 grams of sodium. Read food labels Avoid processed and convenience foods Ask your dietitian before eating any foods not dicussed in the menu planning guidelines Consult your physician  if you wish to use a salt substitute or a sodium containing medication such as antacids.  Limit milk and milk products to 16 oz (2 cups) per day.  Shopping Hints: READ LABELS!! "Dietetic" does not necessarily mean low sodium. Salt and other sodium ingredients are often added to foods during processing.    Menu Planning Guidelines Food Group Choose More Often Avoid  Beverages (see also the milk group All  fruit juices, low-sodium, salt-free vegetables juices, low-sodium carbonated beverages Regular vegetable or tomato juices, commercially softened water used for drinking or cooking  Breads and Cereals Enriched white, wheat, rye and pumpernickel bread, hard rolls and dinner rolls; muffins, cornbread and waffles; most dry cereals, cooked cereal without added salt; unsalted crackers and breadsticks; low sodium or homemade bread crumbs Bread, rolls and crackers with salted tops; quick breads; instant hot cereals; pancakes; commercial bread stuffing; self-rising flower and biscuit mixes; regular bread crumbs or cracker crumbs  Desserts and Sweets Desserts and sweets mad with mild should be within allowance Instant pudding mixes and cake mixes  Fats Butter or margarine; vegetable oils; unsalted salad dressings, regular salad dressings limited to 1 Tbs; light, sour and heavy cream Regular salad dressings containing bacon fat, bacon bits, and salt pork; snack dips made with instant soup mixes or processed cheese; salted nuts  Fruits Most fresh, frozen and canned fruits Fruits processed with salt or sodium-containing ingredient (some dried fruits are processed with sodium sulfites        Vegetables Fresh, frozen vegetables and low- sodium canned vegetables Regular canned vegetables, sauerkraut, pickled vegetables, and others prepared in brine; frozen vegetables in sauces; vegetables seasoned with ham, bacon or salt pork  Condiments, Sauces, Miscellaneous  Salt substitute with physician's approval; pepper, herbs, spices; vinegar, lemon or lime juice; hot pepper sauce; garlic powder, onion powder, low sodium soy sauce (1 Tbs.); low sodium condiments (ketchup, chili sauce, mustard) in limited amounts (1 tsp.) fresh ground horseradish; unsalted tortilla chips, pretzels, potato chips, popcorn, salsa (1/4 cup) Any seasoning made with salt including garlic salt, celery salt, onion salt, and seasoned salt; sea salt, rock  salt, kosher salt; meat tenderizers; monosodium glutamate; mustard, regular soy sauce, barbecue, sauce, chili sauce, teriyaki sauce, steak sauce, Worcestershire sauce, and most flavored vinegars; canned gravy and mixes; regular condiments; salted snack foods, olives, picles, relish, horseradish sauce, catsup   Food preparation: Try these seasonings Meats:    Pork Sage, onion Serve with applesauce  Chicken Poultry seasoning, thyme, parsley Serve with cranberry sauce  Lamb Curry powder, rosemary, garlic, thyme Serve with mint sauce or jelly  Veal Marjoram, basil Serve with current jelly, cranberry sauce  Beef Pepper, bay leaf Serve with dry mustard, unsalted chive butter  Fish [place] leaf, dill Serve with unsalted lemon butter, unsalted parsley butter  Vegetables:    Asparagus Lemon juice   Broccoli Lemon juice   Carrots Mustard dressing parsley, mint, nutmeg, glazed with unsalted butter and sugar   Green beans Marjoram, lemon juice, nutmeg,dill seed   Tomatoes Basil, marjoram, onion   Spice /blend for [employer]" 4 tsp ground thyme 1 tsp ground sage 3 tsp ground rosemary 4 tsp ground marjoram   Test your knowledge A product that says "Salt Free" may still contain sodium. True or False Garlic Powder and Hot Pepper Sauce an be used as alternative seasonings.True or False Processed foods have more sodium than fresh foods.  True or False Canned Vegetables have less sodium than froze True or False   WAYS  TO DECREASE YOUR SODIUM INTAKE Avoid the use of added salt in cooking and at the table.  Table salt (and other prepared seasonings which contain salt) is probably one of the greatest sources of sodium in the diet.  Unsalted foods can gain flavor from the sweet, sour, and butter taste sensations of herbs and spices.  Instead of using salt for seasoning, try the following seasonings with the foods listed.  Remember: how you use them to enhance natural food flavors is limited only by your  creativity... Allspice-Meat, fish, eggs, fruit, peas, red and yellow vegetables Almond Extract-Fruit baked goods Anise Seed-Sweet breads, fruit, carrots, beets, cottage cheese, cookies (tastes like licorice) Basil-Meat, fish, eggs, vegetables, rice, vegetables salads, soups, sauces Bay Leaf-Meat, fish, stews, poultry Burnet-Salad, vegetables (cucumber-like flavor) Caraway Seed-Bread, cookies, cottage cheese, meat, vegetables, cheese, rice Cardamon-Baked goods, fruit, soups Celery Powder or seed-Salads, salad dressings, sauces, meatloaf, soup, bread.Do not use  celery salt Chervil-Meats, salads, fish, eggs, vegetables, cottage cheese (parsley-like flavor) Chili Power-Meatloaf, chicken cheese, corn, eggplant, egg dishes Chives-Salads cottage cheese, egg dishes, soups, vegetables, sauces Cilantro-Salsa, casseroles Cinnamon-Baked goods, fruit, pork, lamb, chicken, carrots Cloves-Fruit, baked goods, fish, pot roast, green beans, beets, carrots Coriander-Pastry, cookies, meat, salads, cheese (lemon-orange flavor) Cumin-Meatloaf, fish,cheese, eggs, cabbage,fruit pie (caraway flavor) [employer], fruit, eggs, fish, poultry, cottage cheese, vegetables Dill Seed-Meat, cottage cheese, poultry, vegetables, fish, salads, bread Fennel Seed-Bread, cookies, apples, pork, eggs, fish, beets, cabbage, cheese, Licorice-like flavor Garlic-(buds or powder) Salads, meat, poultry, fish, bread, butter, vegetables, potatoes.Do not  use garlic salt Ginger-Fruit, vegetables, baked goods, meat, fish, poultry Horseradish Root-Meet, vegetables, butter Lemon Juice or Extract-Vegetables, fruit, tea, baked goods, fish salads Mace-Baked goods fruit, vegetables, fish, poultry (taste like nutmeg) Maple Extract-Syrups Marjoram-Meat, chicken, fish, vegetables, breads, green salads (taste like Sage) Mint-Tea, lamb, sherbet, vegetables, desserts, carrots, cabbage Mustard, Dry or Seed-Cheese, eggs, meats, vegetables,  poultry Nutmeg-Baked goods, fruit, chicken, eggs, vegetables, desserts Onion Powder-Meat, fish, poultry, vegetables, cheese, eggs, bread, rice salads (Do not use   Onion salt) Orange Extract-Desserts, baked goods Oregano-Pasta, eggs, cheese, onions, pork, lamb, fish, chicken, vegetables, green salads Paprika-Meat, fish, poultry, eggs, cheese, vegetables Parsley Flakes-Butter, vegetables, meat fish, poultry, eggs, bread, salads (certain forms may   Contain sodium Pepper-Meat fish, poultry, vegetables, eggs Peppermint Extract-Desserts, baked goods Poppy Seed-Eggs, bread, cheese, fruit dressings, baked goods, noodles, vegetables, cottage  [employer], poultry, meat, fish, cauliflower, turnips,eggs bread Saffron-Rice, bread, veal, chicken, fish, eggs Sage-Meat, fish, poultry, onions, eggplant, tomateos, pork, stews Savory-Eggs, salads, poultry, meat, rice, vegetables, soups, pork Tarragon-Meat, poultry, fish, eggs, butter, vegetables (licorice-like flavor)  Thyme-Meat, poultry, fish, eggs, vegetables, (clover-like flavor), sauces, soups Tumeric-Salads, butter, eggs, fish, rice, vegetables (saffron-like flavor) Vanilla Extract-Baked goods, candy Vinegar-Salads, vegetables, meat marinades Walnut Extract-baked goods, candy   2. Choose your Foods Wisely   The following is a list of foods to avoid which are high in sodium:  Meats-Avoid all smoked, canned, salt cured, dried and kosher meat and fish as well as Anchovies   [employer] meats:Bologna, Liverwurst, Pastrami Canned meat or fish  Marinated herring Caviar    Pepperoni Corned Beef   Pizza Dried chipped beef  Salami Frozen breaded fish or meat Salt pork Frankfurters or hot dogs  Sardines Gefilte fish   Sausage Ham (boiled ham, Proscuitto Smoked butt    spiced ham)   Spam      TV Dinners Vegetables Canned vegetables (Regular) Relish Canned mushrooms  Sauerkraut Olives  Tomato  juice Pickles  Bakery and Dessert Products Canned puddings  Cream pies Cheesecake   Decorated cakes Cookies  Beverages/Juices Tomato juice, regular  Gatorade   V-8 vegetable juice, regular  Breads and Cereals Biscuit mixes   Salted potato chips, corn chips, pretzels Bread stuffing mixes  Salted crackers and rolls Pancake and waffle mixes Self-rising flour  Seasonings Accent    Meat sauces Barbecue sauce  Meat tenderizer Catsup    Monosodium glutamate (MSG) Celery salt   Onion salt Chili sauce   Prepared mustard Garlic salt   Salt, seasoned salt, sea salt Gravy mixes   Soy sauce Horseradish   Steak sauce Ketchup   Tartar sauce Lite salt    Teriyaki sauce Marinade mixes   Worcestershire sauce  Others Baking powder   Cocoa and cocoa mixes Baking soda   Commercial casserole mixes Candy-caramels, chocolate  Dehydrated soups    Bars, fudge,nougats  Instant rice and pasta mixes Canned broth or soup  Maraschino cherries Cheese, aged and processed cheese and cheese spreads  Learning Assessment Quiz  Indicated T (for True) or F (for False) for each of the following statements:  _____ Fresh fruits and vegetables and unprocessed grains are generally low in sodium _____ Water may contain a considerable amount of sodium, depending on the source _____ You can always tell if a food is high in sodium by tasting it _____ Certain laxatives my be high in sodium and should be avoided unless prescribed   by a physician or pharmacist _____ Salt substitutes may be used freely by anyone on a sodium restricted diet _____ Sodium is present in table salt, food additives and as a natural component of   most foods _____ Table salt is approximately 90% sodium _____ Limiting sodium intake may help prevent excess fluid accumulation in the body _____ On a sodium-restricted diet, seasonings such as bouillon soy sauce, and    cooking wine should be used in place of table salt _____ On an ingredient  list, a product which lists monosodium glutamate as the first   ingredient is an appropriate food to include on a low sodium diet  Circle the best answer(s) to the following statements (Hint: there may be more than one correct answer)  11. On a low-sodium diet, some acceptable snack items are:    A. Olives  F. Bean dip   K. Grapefruit juice    B. Salted Pretzels G. Commercial Popcorn   L. Canned peaches    C. Carrot Sticks  H. Bouillon   M. Unsalted nuts   D. [place] fries  I. Peanut butter crackers N. Salami   E. Sweet pickles J. Tomato Juice   O. Pizza  12.  Seasonings that may be used freely on a reduced - sodium diet include   A. Lemon wedges F.Monosodium glutamate K. Celery seed    B.Soysauce   G. Pepper   L. Mustard powder   C. Sea salt  H. Cooking wine  M. Onion flakes   D. Vinegar  E. Prepared horseradish N. Salsa   E. Sage   J. Worcestershire sauce  O. Chutney

## 2021-11-07 NOTE — Progress Notes (Unsigned)
Cardiology Office Note    Date:  11/21/2021   ID:  Joe Cole, DOB 1951/02/11, MRN [id number]   PCP:  [name], MD   [facility]  Cardiologist:  Candee Furbish, MD   Advanced Practice Provider:  No care team member to display Electrophysiologist:  None   [phone]   No chief complaint on file.   History of Present Illness:  Joe Cole is a 71 y.o. male with history of permanent Afib on eliquis, HLD, Has stage III non-small cell lung cancer diagnosed in July 2020 post chemotherapy with radiation, COPD.    Patient last saw Dr. Marlou Porch 05/07/21 with decreased energy but otherwise stable.    I saw the patient 11/06/21 and He had X-rays of knee and spine and was told he had a calcified aorta and ortho was concerned about right leg swelling which has been chronic for the past 3 years. Chronic DOE unchanged. No chest pain. For CT chest today and oncology f/u. He denies claudication symptoms but is limited with his breathing. No injury to right leg. I added low dose lasix for 3 days then prn and offered coronary calcium score that they wanted to think about. Labs were stable, BNP 1089 and told to stay on lasix daily.  Echo 11/20/21 low normal mild global Hk, asymmetric LVH, mild dilatation ascending aorta 43 mm. Dopplers show possible high grade stenosis vs occlusion of distal CFA, ostial SFA and ostial DFA. Vascular consult put in. Patient had bronchoscopy on Monday and they suspect recurrent lung CA. Still smokes occasional cigarette. Chronic DOE unchanged, denies leg pain, swelling unchanged on lasix, bmet stable.    Past Medical History:  Diagnosis Date   Arthritis    Atrial fibrillation ([place]) 10/2018   COPD with emphysema ([place])    Dyspnea    Hypertension    lung ca dx'd 10/2018    Past Surgical History:  Procedure Laterality Date   BRONCHIAL BIOPSY  11/19/2021   Procedure: BRONCHIAL BIOPSIES;  Surgeon: Garner Nash, DO;  Location: [facility];  Service: Pulmonary;;   BRONCHIAL WASHINGS  11/19/2021   Procedure: BRONCHIAL WASHINGS;  Surgeon: Garner Nash, DO;  Location: [facility];  Service: Pulmonary;;   CARDIOVERSION N/A 01/11/2019   Procedure: CARDIOVERSION;  Surgeon: Josue Hector, MD;  Location: [facility];  Service: Cardiovascular;  Laterality: N/A;   CATARACT EXTRACTION W/ INTRAOCULAR LENS IMPLANT Bilateral    KNEE ARTHROSCOPY     BIL    THORACENTESIS  11/19/2021   Procedure: THORACENTESIS;  Surgeon: Garner Nash, DO;  Location: [facility];  Service: Pulmonary;;   TOE SURGERY     PINNED LEFT BIG TOE   TONSILLECTOMY     T+A  AS CHILD   TOTAL KNEE ARTHROPLASTY Left 08/19/2017   Procedure: TOTAL KNEE ARTHROPLASTY;  Surgeon: Renette Butters, MD;  Location: [facility];  Service: Orthopedics;  Laterality: Left;   VIDEO BRONCHOSCOPY Bilateral 11/05/2018   Procedure: VIDEO BRONCHOSCOPY WITH FLUORO;  Surgeon: [name], MD;  Location: [facility];  Service: Cardiopulmonary;  Laterality: Bilateral;   VIDEO BRONCHOSCOPY Bilateral 11/09/2018   Procedure: VIDEO BRONCHOSCOPY WITH FLUORO;  Surgeon: Rigoberto Noel, MD;  Location: [facility];  Service: Cardiopulmonary;  Laterality: Bilateral;   VIDEO BRONCHOSCOPY Right 11/19/2021   Procedure: VIDEO BRONCHOSCOPY WITHOUT FLUORO;  Surgeon: Garner Nash, DO;  Location: [facility];  Service: Pulmonary;  Laterality: Right;    Current Medications: Current Meds  Medication Sig  acetaminophen (TYLENOL) 500 MG tablet Take 1,000 mg by mouth every 6 (six) hours as needed for moderate pain.   atorvastatin (LIPITOR) 20 MG tablet Take 20 mg by mouth daily.   diltiazem (CARDIZEM CD) 360 MG 24 hr capsule Take 1 capsule (360 mg total) by mouth daily.   ELIQUIS 5 MG TABS tablet TAKE ONE TABLET BY MOUTH TWICE DAILY   Fluticasone-Umeclidin-Vilant (TRELEGY ELLIPTA) 100-62.5-25 MCG/ACT AEPB Inhale 1 puff into the lungs daily in the afternoon.   furosemide (LASIX) 20 MG  tablet Take 1 tablet (20 mg total) by mouth daily as needed. (Patient taking differently: Take 20 mg by mouth daily.)   levothyroxine (SYNTHROID) 88 MCG tablet Take 88 mcg by mouth daily.   metoprolol succinate (TOPROL-XL) 100 MG 24 hr tablet TAKE ONE TABLET BY MOUTH ONCE DAILY WITH OR immediately following A meal (Patient taking differently: Take 100 mg by mouth every evening.)   potassium chloride (KLOR-CON M) 10 MEQ tablet Take 1 tablet (10 mEq total) by mouth daily as needed. (Patient taking differently: Take 10 mEq by mouth daily.)   traZODone (DESYREL) 50 MG tablet TAKE 1 TABLET BY MOUTH EVERYDAY AT BEDTIME (Patient taking differently: Take 50 mg by mouth at bedtime as needed for sleep.)     Allergies:   Patient has no known allergies.   Social History   Socioeconomic History   Marital status: Married    Spouse name: Not on file   Number of children: Not on file   Years of education: Not on file   Highest education level: Not on file  Occupational History   Not on file  Tobacco Use   Smoking status: Former    Packs/day: 1.50    Years: 46.00    Total pack years: 69.00    Types: Cigarettes    Quit date: 09/08/2018    Years since quitting: 3.2   Smokeless tobacco: Never  Vaping Use   Vaping Use: Never used  Substance and Sexual Activity   Alcohol use: Yes    Alcohol/week: 2.0 - 3.0 standard drinks of alcohol    Types: 2 - 3 Shots of liquor per week    Comment: FEW DRINKS AFTER WORK   Drug use: Never   Sexual activity: Not on file  Other Topics Concern   Not on file  Social History Narrative   Not on file   Social Determinants of Health   Financial Resource Strain: Not on file  Food Insecurity: Not on file  Transportation Needs: Not on file  Physical Activity: Not on file  Stress: Not on file  Social Connections: Not on file     Family History:  The patient's  family history includes Atrial fibrillation in his mother; Emphysema in his father; Pneumonia in his  father.   ROS:   Please see the history of present illness.    ROS All other systems reviewed and are negative.   PHYSICAL EXAM:   VS:  BP 96/60   Pulse 83   Ht 6' (1.829 m)   Wt 182 lb (82.6 kg)   SpO2 94%   BMI 24.68 kg/m   Physical Exam  GEN: Thin, in no acute distress  Neck: no JVD, carotid bruits, or masses Cardiac:RRR; no murmurs, rubs, or gallops  Respiratory:  decreased breath sounds throughout without rales GI: soft, nontender, nondistended, + BS Ext: RLE unchanged, can't palpate distal pulses bilaterally Neuro:  Alert and Oriented x 3,  Psych: euthymic mood, full affect  Wt  Readings from Last 3 Encounters:  11/21/21 182 lb (82.6 kg)  11/19/21 187 lb (84.8 kg)  11/13/21 187 lb (84.8 kg)      Studies/Labs Reviewed:   EKG:  EKG is not ordered today.   Recent Labs: 11/06/2021: ALT 42; Hemoglobin 13.2; NT-Pro BNP 1,089; Platelet Count 117; TSH 6.050 11/20/2021: BUN 14; Creatinine, Ser 1.04; Potassium 5.1; Sodium 131   Lipid Panel    Component Value Date/Time   CHOL 142 11/06/2021 0840   TRIG 70 11/06/2021 0840   HDL 73 11/06/2021 0840   CHOLHDL 1.9 11/06/2021 0840   LDLCALC 55 11/06/2021 0840    Additional studies/ records that were reviewed today include:  LE art duplex 11/20/21 Summary:  Right: Resting right ankle-brachial index indicates moderate right lower  extremity arterial disease. The right toe-brachial index is abnormal.   Left: Resting left ankle-brachial index is within normal range. No  evidence of significant left lower extremity arterial disease. The left  toe-brachial index is abnormal.    *See table(s) above for measurements and observations.  See LE Arterial duplex report.   Vascular consult recommended.    Summary:    Abnormal tapering of the abdominal aorta, with largest measurement in the  mid segment at 2.4 cm. No evidence of stenosis noted in the abdominal  aorta. Normal dimensions noted of the bilateral common and  external iliac  arteries.   Right: Atherosclerosis throughout, with extensive, bulky calcifications  and shadowing at the distal CFA and ostium DFA and SFA.  Possible high grade stenosis vs occlusion in the distal CFA, ostial SFA  and ostial DFA.   Left: Atherosclerosis throughout.  >50% stenosis in the mid common iliac artery, low end range.  <50% stenosis in the distal common iliac artery, and mid and distal  external iliac artery.  50-74% stenosis in the distal SFA, low end range.  Occluded distal ATA.       See table(s) above for measurements and observations.  See ABI report.   Vascular consult recommended.     Echo 11/20/21 IMPRESSIONS      1. Left ventricular ejection fraction, by estimation, is 50%. The left ventricle has mildly decreased function. The left ventricle demonstrates global hypokinesis. There is mild asymmetric left ventricular hypertrophy of the basal-septal segment. Left  ventricular diastolic parameters are indeterminate. The average left ventricular global longitudinal strain is -14.8 %. The global longitudinal strain is abnormal.  2. Right ventricular systolic function is normal. The right ventricular size is mildly enlarged. There is normal pulmonary artery systolic pressure. The estimated right ventricular systolic pressure is 57.0 mmHg.  3. The mitral valve is abnormal. No evidence of mitral valve regurgitation.  4. Aortic dilatation noted. There is mild dilatation of the ascending aorta, measuring 43 mm, though measured off axis.  5. The aortic valve is tricuspid. There is mild calcification of the aortic valve. There is mild thickening of the aortic valve. Aortic valve regurgitation is mild. Aortic valve sclerosis is present, with no evidence of aortic valve stenosis.  6. Left atrial size was moderately dilated.  7. Right atrial size was moderately dilated.  8. The inferior vena cava is normal in size with greater than 50% respiratory variability,  suggesting right atrial pressure of 3 mmHg.   Echo 11/06/18 IMPRESSIONS     1. The left ventricle has low normal systolic function, with an ejection  fraction of 50-55%. The cavity size was mildly dilated. Left ventricular  diastolic Doppler parameters are indeterminate.  2. Extremely poor acoustic windows limit study.   3. The right ventricle was not well visualized. The cavity was moderately  enlarged. There is not assessed.   4. RV is not well seen RVEF is at least mildly depressed.   5. Left atrial size was severely dilated.   6. Right atrial size was severely dilated.   7. The mitral valve is abnormal. Mild thickening of the mitral valve  leaflet. There is mild mitral annular calcification present.   8. The tricuspid valve is grossly normal.   9. The aortic valve is abnormal. Mild thickening of the aortic valve.  Mild calcification of the aortic valve. Aortic valve regurgitation is  trivial by color flow Doppler.  10. The inferior vena cava was dilated in size with <50% respiratory  variability.     Risk Assessment/Calculations:    CHA2DS2-VASc Score = 3   This indicates a 3.2% annual risk of stroke. The patient's score is based upon: CHF History: 0 HTN History: 1 Diabetes History: 0 Stroke History: 0 Vascular Disease History: 1 Age Score: 1 Gender Score: 0        ASSESSMENT:    1. Aortic atherosclerosis ([place])   2. Lower extremity edema   3. Persistent atrial fibrillation ([place])   4. Pure hypercholesterolemia   5. Stage III squamous cell carcinoma of right lung ([place])   6. COPD mixed type ([place])      PLAN:  In order of problems listed above:  Aortic atherosclerosis noted on Xray of knee and spine. PET scan in 04/2021 showed the same and no aneurysm. CT chest no aneurysm of thoracic aorta in 09/2020 and stable on CT 11/06/21. Offered coronary calcium score/CTA  but he is already on a statin. No ASA with eliquis. Discussed with Dr. Marlou Porch. With probable CFA  occlusion and possible recurrent lung CA, and no symptoms of chest pain will not pursue ischemic workup at this time.   RLE edema chronic for > 3 yrs. RCFA occlusion vs high grade stenosis-referral to vascular surgery put in. Trying to cut back on extra salt in diet. No injury, pain.  BNP 1089,   echo-low normal LVEF 50% mild global HK asym strain  Add low dose lasix 20 mg daily   K 10 meq 3 days  bmet normal yest    Persistent Afib on eliquis and diltiazem and Torpol XL-rate controlled   HLD on lipitor, LDL 74     Stage 3 squamous cell CA of right lung with possible recurrence on bronchoscopy Monday   COPD/chronic DOE per pulm    Shared Decision Making/Informed Consent        Medication Adjustments/Labs and Tests Ordered: Current medicines are reviewed at length with the patient today.  Concerns regarding medicines are outlined above.  Medication changes, Labs and Tests ordered today are listed in the Patient Instructions below. Patient Instructions  Medication Instructions:  Your physician recommends that you continue on your current medications as directed. Please refer to the Current Medication list given to you today.  *If you need a refill on your cardiac medications before your next appointment, please call your pharmacy*   Lab Work: None If you have labs (blood work) drawn today and your tests are completely normal, you will receive your results only by: [facility] (if you have MyChart) OR A paper copy in the mail If you have any lab test that is abnormal or we need to change your treatment, we will call you to review the  results.   Follow-Up: At [facility], you and your health needs are our priority.  As part of our continuing mission to provide you with exceptional heart care, we have created designated Provider Care Teams.  These Care Teams include your primary Cardiologist (physician) and Advanced Practice Providers (APPs -  Physician Assistants and Nurse  Practitioners) who all work together to provide you with the care you need, when you need it.  We recommend signing up for the patient portal called "MyChart".  Sign up information is provided on this After Visit Summary.  MyChart is used to connect with patients for Virtual Visits (Telemedicine).  Patients are able to view lab/test results, encounter notes, upcoming appointments, etc.  Non-urgent messages can be sent to your provider as well.   To learn more about what you can do with MyChart, go to [email or web address].    Your next appointment:   1st Available   The format for your next appointment:   In Person  Provider:   Candee Furbish, MD {    Signed, Ermalinda Barrios, PA-C  11/21/2021 8:09 AM    [address], [place], [place]  [address] Phone: [phone]; Fax: [phone]

## 2021-11-08 ENCOUNTER — Inpatient Hospital Stay: Payer: Medicare HMO | Admitting: Internal Medicine

## 2021-11-08 ENCOUNTER — Other Ambulatory Visit: Payer: Self-pay

## 2021-11-08 VITALS — BP 137/85 | HR 92 | Temp 97.2°F | Resp 17 | Wt 187.2 lb

## 2021-11-08 DIAGNOSIS — C3491 Malignant neoplasm of unspecified part of right bronchus or lung: Secondary | ICD-10-CM

## 2021-11-08 DIAGNOSIS — Z9221 Personal history of antineoplastic chemotherapy: Secondary | ICD-10-CM | POA: Diagnosis not present

## 2021-11-08 DIAGNOSIS — C3490 Malignant neoplasm of unspecified part of unspecified bronchus or lung: Secondary | ICD-10-CM

## 2021-11-08 DIAGNOSIS — Z85118 Personal history of other malignant neoplasm of bronchus and lung: Secondary | ICD-10-CM | POA: Diagnosis not present

## 2021-11-08 DIAGNOSIS — M7989 Other specified soft tissue disorders: Secondary | ICD-10-CM | POA: Diagnosis not present

## 2021-11-08 DIAGNOSIS — I7090 Unspecified atherosclerosis: Secondary | ICD-10-CM | POA: Diagnosis not present

## 2021-11-08 DIAGNOSIS — I4891 Unspecified atrial fibrillation: Secondary | ICD-10-CM | POA: Diagnosis not present

## 2021-11-08 DIAGNOSIS — Z7901 Long term (current) use of anticoagulants: Secondary | ICD-10-CM | POA: Diagnosis not present

## 2021-11-08 NOTE — Progress Notes (Signed)
[place] [phone]   [phone]  OFFICE PROGRESS NOTE  [name], MD [place] [address]  DIAGNOSIS: Stage IIIA (T3, N1, M0) non-small cell lung cancer, squamous cell carcinoma diagnosed in July 2020 and presented with right middle lobe mass and right hilar adenopathy with postobstructive pneumonia and suspicious right pleural effusion.   PRIOR THERAPY:   1) Weekly concurrent chemoradiation with Carboplatin for an AUC of 2 and paclitaxel 45 mg/m2. First dose 11/23/2018. Status post 7 cycles.  2) Consolidation immunotherapy with Imfinzi 10 mg/KG every 2 weeks.  First dose February 08, 2019.  Status post 26 cycles.  CURRENT THERAPY: Observation.  INTERVAL HISTORY: Joe Cole 71 y.o. male returns to the clinic today for follow-up visit accompanied by his wife.  The patient has been dealing with several problems recently including swelling of the right lower extremities as well as some cardiac condition and atherosclerosis.  He is followed by Dr. Marlou Porch for his cardiac condition.  He had the Doppler of the lower extremity as well as 2D echo ordered by Dr. Marlou Porch.  He denied having any current chest pain but has shortness of breath with exertion with no cough or hemoptysis.  He denied having any nausea, vomiting, diarrhea or constipation.  He has no headache or visual changes.  He is here today for evaluation with repeat CT scan of the chest for restaging of his disease.  MEDICAL HISTORY: Past Medical History:  Diagnosis Date   Arthritis    Atrial fibrillation ([place]) 10/2018   COPD with emphysema ([place])    Dyspnea    Hypertension    lung ca dx'd 10/2018    ALLERGIES:  has No Known Allergies.  MEDICATIONS:  Current Outpatient Medications  Medication Sig Dispense Refill   atorvastatin (LIPITOR) 20 MG tablet Take 20 mg by mouth daily.     diltiazem (CARDIZEM CD) 360 MG 24 hr capsule Take 1 capsule (360 mg  total) by mouth daily. 90 capsule 3   ELIQUIS 5 MG TABS tablet TAKE ONE TABLET BY MOUTH TWICE DAILY 180 tablet 1   Fluticasone-Umeclidin-Vilant (TRELEGY ELLIPTA) 100-62.5-25 MCG/ACT AEPB Inhale 1 puff into the lungs daily in the afternoon. 60 each 5   Fluticasone-Umeclidin-Vilant (TRELEGY ELLIPTA) 100-62.5-25 MCG/ACT AEPB Inhale 1 puff into the lungs daily. 1 each 0   furosemide (LASIX) 20 MG tablet Take 1 tablet (20 mg total) by mouth daily as needed. 90 tablet 3   levothyroxine (SYNTHROID) 88 MCG tablet Take 88 mcg by mouth daily.     metoprolol succinate (TOPROL-XL) 100 MG 24 hr tablet TAKE ONE TABLET BY MOUTH ONCE DAILY WITH OR immediately following A meal 90 tablet 0   potassium chloride (KLOR-CON M) 10 MEQ tablet Take 1 tablet (10 mEq total) by mouth daily as needed. 90 tablet 3   traZODone (DESYREL) 50 MG tablet TAKE 1 TABLET BY MOUTH EVERYDAY AT BEDTIME 90 tablet 1   No current facility-administered medications for this visit.    SURGICAL HISTORY:  Past Surgical History:  Procedure Laterality Date   CARDIOVERSION N/A 01/11/2019   Procedure: CARDIOVERSION;  Surgeon: Josue Hector, MD;  Location: [facility] ENDOSCOPY;  Service: Cardiovascular;  Laterality: N/A;   KNEE ARTHROSCOPY     BIL    TOE SURGERY     PINNED LEFT BIG TOE   TONSILLECTOMY     T+A  AS CHILD   TOTAL KNEE ARTHROPLASTY Left 08/19/2017  Procedure: TOTAL KNEE ARTHROPLASTY;  Surgeon: Renette Butters, MD;  Location: [place];  Service: Orthopedics;  Laterality: Left;   VIDEO BRONCHOSCOPY Bilateral 11/05/2018   Procedure: VIDEO BRONCHOSCOPY WITH FLUORO;  Surgeon: [name], MD;  Location: [place];  Service: Cardiopulmonary;  Laterality: Bilateral;   VIDEO BRONCHOSCOPY Bilateral 11/09/2018   Procedure: VIDEO BRONCHOSCOPY WITH FLUORO;  Surgeon: Rigoberto Noel, MD;  Location: [place];  Service: Cardiopulmonary;  Laterality: Bilateral;    REVIEW OF SYSTEMS:  A comprehensive review of systems was negative except for:  Constitutional: positive for fatigue Respiratory: positive for dyspnea on exertion   PHYSICAL EXAMINATION: General appearance: alert, cooperative, fatigued, and no distress Head: Normocephalic, without obvious abnormality, atraumatic Neck: no adenopathy, no JVD, supple, symmetrical, trachea midline, and thyroid not enlarged, symmetric, no tenderness/mass/nodules Lymph nodes: Cervical, supraclavicular, and axillary nodes normal. Resp: diminished breath sounds RLL and dullness to percussion RLL Back: symmetric, no curvature. ROM normal. No CVA tenderness. Cardio: regular rate and rhythm, S1, S2 normal, no murmur, click, rub or gallop GI: soft, non-tender; bowel sounds normal; no masses,  no organomegaly Extremities: edema 2+ edema right lower extremity  ECOG PERFORMANCE STATUS: 1 - Symptomatic but completely ambulatory  Blood pressure 137/85, pulse 92, temperature (!) 97.2 F (36.2 C), temperature source Tympanic, resp. rate 17, weight 187 lb 3 oz (84.9 kg), SpO2 95 %.  LABORATORY DATA: Lab Results  Component Value Date   WBC 6.1 11/06/2021   HGB 13.2 11/06/2021   HCT 36.0 (L) 11/06/2021   MCV 118.4 (H) 11/06/2021   PLT 117 (L) 11/06/2021      Chemistry      Component Value Date/Time   NA 134 (L) 11/06/2021 1153   NA 134 11/06/2021 0840   K 4.5 11/06/2021 1153   CL 100 11/06/2021 1153   CO2 27 11/06/2021 1153   BUN 11 11/06/2021 1153   BUN 9 11/06/2021 0840   CREATININE 0.79 11/06/2021 1153   CREATININE 0.72 (L) 11/06/2021 0840      Component Value Date/Time   CALCIUM 9.4 11/06/2021 1153   ALKPHOS 77 11/06/2021 1153   AST 41 11/06/2021 1153   AST 42 (H) 11/06/2021 0840   ALT 42 11/06/2021 1153   ALT 42 11/06/2021 0840   BILITOT 1.2 11/06/2021 1153   BILITOT 0.8 11/06/2021 0840       RADIOGRAPHIC STUDIES: CT Chest W Contrast  Result Date: 11/07/2021 CLINICAL DATA:  Primary Cancer Type: Lung Imaging Indication: Routine surveillance Interval therapy since last  imaging? No Initial Cancer Diagnosis Date: 11/09/2018; Established by: Biopsy-proven Detailed Pathology: Stage IIIA non-small cell lung cancer, squamous cell carcinoma. Primary Tumor location:  Right middle lobe. Surgeries: No. Chemotherapy: Yes; Ongoing?  No; Most recent administration: 12/2018 Immunotherapy?  Yes; Type: Imfinzi; Ongoing? No Radiation therapy? Yes; Date Range: 11/19/2018 - 01/05/2019; Target: Right lung, perihilar * Tracking Code: BO * EXAM: CT CHEST WITH CONTRAST TECHNIQUE: Multidetector CT imaging of the chest was performed during intravenous contrast administration. RADIATION DOSE REDUCTION: This exam was performed according to the departmental dose-optimization program which includes automated exposure control, adjustment of the mA and/or kV according to patient size and/or use of iterative reconstruction technique. CONTRAST:  10mL OMNIPAQUE IOHEXOL 300 MG/ML  SOLN COMPARISON:  Most recent CT chst 04/12/2021.  05/15/2021 PET-CT. FINDINGS: Cardiovascular: The heart is normal in size. No pericardial effusion. The aorta is normal in caliber. No dissection. Stable atherosclerotic calcifications. Stable branch vessel calcifications including the coronary arteries. The pulmonary arteries appear normal.  Mediastinum/Nodes: No mediastinal or hilar mass or adenopathy. Small scattered lymph nodes are stable. Lungs/Pleura: Progressive right middle lobe atelectasis and new right lower lobe atelectasis. Near complete occlusion of the bronchus intermedius with only a small amount residual aerated right lower lobe. Persistent large right pleural effusion may be contributory. No pulmonary lesions or pulmonary nodules. The left lung remains clear. Upper Abdomen: Stable band of low attenuation in the liver, likely fatty change. No worrisome hepatic lesions. Stable small right adrenal gland adenoma. Stable advanced aortic calcifications. Musculoskeletal: No significant bony findings. IMPRESSION: 1. Progressive  right middle lobe and right lower lobe atelectasis. Near complete occlusion of the bronchus intermedius and only a small amount of residual aerated right middle lobe and right lower lobe. Bronchoscopy may be helpful for further evaluation. 2. Persistent large right pleural effusion. 3. No pulmonary lesions or pulmonary nodules. No mediastinal or hilar mass or adenopathy. 4. Stable advanced atherosclerotic calcifications involving the major arterial structures. 5. Stable right adrenal gland adenoma. Aortic Atherosclerosis (ICD10-I70.0). Electronically Signed   By: Marijo Sanes M.D.   On: 11/07/2021 11:22    ASSESSMENT AND PLAN: This is a very pleasant 71 years old white male with stage IIIA non-small cell lung cancer, squamous cell carcinoma.  The patient underwent a course of concurrent chemoradiation with weekly carboplatin and paclitaxel status post 7 cycles.  The patient tolerated his treatment well except for mild fatigue and odynophagia. The patient completed a course of consolidation treatment with immunotherapy with Imfinzi every 2 weeks status post 26 cycles.  He tolerated his treatment well with no concerning adverse effects. The patient has been on observation since that time.  He had a PET scan in January 2023 that showed no hypermetabolic activity in the central right lung/right hilum to suggest local disease recurrence.  He has no other evidence of metastatic disease.  He also underwent ultrasound-guided right thoracentesis on May 28 2021 with drainage of 1.2 L of clear yellow fluid.  The cytology showed reactive mesothelial cells. The patient had repeat CT scan of the chest performed recently.  I personally and independently reviewed the scan images and discussed the results and showed the images to the patient and his wife.  His scan showed progressive right middle lobe and right lower lobe atelectasis with near complete occlusion of the bronchus intermedius and only a small amount of  residual aerated right middle lobe and right lower lobe. I recommended for the patient to see Dr. Halford Chessman for consideration of repeat bronchoscopy to rule out any disease recurrence. For the swelling of the right lower extremity, he has a Doppler ordered by his cardiologist to rule out deep venous thrombosis.  He also had 2D echo ordered. I recommended for the patient to continue on observation with repeat CT scan of the chest in 3 months or sooner if the bronchoscopy showed evidence for disease recurrence. For the history of atrial fibrillation, he will continue his current treatment with Eliquis. The patient was advised to call immediately if he has any other concerning symptoms in the interval. The patient voices understanding of current disease status and treatment options and is in agreement with the current care plan. All questions were answered. The patient knows to call the clinic with any problems, questions or concerns. We can certainly see the patient much sooner if necessary. The total time spent in the appointment was 30 minutes.  Disclaimer: This note was dictated with voice recognition software. Similar sounding words can inadvertently be transcribed and  may not be corrected upon review.

## 2021-11-09 ENCOUNTER — Other Ambulatory Visit: Payer: Self-pay | Admitting: Physician Assistant

## 2021-11-09 DIAGNOSIS — I739 Peripheral vascular disease, unspecified: Secondary | ICD-10-CM

## 2021-11-12 ENCOUNTER — Other Ambulatory Visit: Payer: Medicare HMO

## 2021-11-13 ENCOUNTER — Ambulatory Visit: Payer: Medicare HMO | Admitting: Pulmonary Disease

## 2021-11-13 ENCOUNTER — Encounter: Payer: Self-pay | Admitting: Pulmonary Disease

## 2021-11-13 VITALS — BP 116/70 | HR 78 | Temp 98.0°F | Ht 72.0 in | Wt 187.0 lb

## 2021-11-13 DIAGNOSIS — R058 Other specified cough: Secondary | ICD-10-CM

## 2021-11-13 DIAGNOSIS — J90 Pleural effusion, not elsewhere classified: Secondary | ICD-10-CM | POA: Diagnosis not present

## 2021-11-13 DIAGNOSIS — J432 Centrilobular emphysema: Secondary | ICD-10-CM

## 2021-11-13 DIAGNOSIS — C3491 Malignant neoplasm of unspecified part of right bronchus or lung: Secondary | ICD-10-CM | POA: Diagnosis not present

## 2021-11-13 LAB — LIPID PANEL
Chol/HDL Ratio: 1.9 ratio (ref 0.0–5.0)
Cholesterol, Total: 142 mg/dL (ref 100–199)
HDL: 73 mg/dL (ref >39)
LDL Chol Calc (NIH): 55 mg/dL (ref 0–99)
Triglycerides: 70 mg/dL (ref 0–149)
VLDL Cholesterol Cal: 14 mg/dL (ref 5–40)

## 2021-11-13 LAB — COMPREHENSIVE METABOLIC PANEL
ALT: 42 IU/L (ref 0–44)
AST: 42 IU/L — ABNORMAL HIGH (ref 0–40)
Albumin/Globulin Ratio: 1.4 (ref 1.2–2.2)
Albumin: 3.9 g/dL (ref 3.9–4.9)
Alkaline Phosphatase: 98 IU/L (ref 44–121)
BUN/Creatinine Ratio: 13 (ref 10–24)
BUN: 9 mg/dL (ref 8–27)
Bilirubin Total: 0.8 mg/dL (ref 0.0–1.2)
CO2: 22 mmol/L (ref 20–29)
Calcium: 9.2 mg/dL (ref 8.6–10.2)
Chloride: 97 mmol/L (ref 96–106)
Creatinine, Ser: 0.72 mg/dL — ABNORMAL LOW (ref 0.76–1.27)
Globulin, Total: 2.7 g/dL (ref 1.5–4.5)
Glucose: 88 mg/dL (ref 70–99)
Potassium: 4.7 mmol/L (ref 3.5–5.2)
Sodium: 134 mmol/L (ref 134–144)
Total Protein: 6.6 g/dL (ref 6.0–8.5)
eGFR: 99 mL/min/{1.73_m2} (ref >59)

## 2021-11-13 LAB — CBC
Hematocrit: 37.6 % (ref 37.5–51.0)
Hemoglobin: 13.6 g/dL (ref 13.0–17.7)
MCH: 42.8 pg — ABNORMAL HIGH (ref 26.6–33.0)
MCHC: 36.2 g/dL — ABNORMAL HIGH (ref 31.5–35.7)
MCV: 118 fL — ABNORMAL HIGH (ref 79–97)
Platelets: 133 10*3/uL — ABNORMAL LOW (ref 150–450)
RBC: 3.18 x10E6/uL — ABNORMAL LOW (ref 4.14–5.80)
RDW: 14.9 % (ref 11.6–15.4)
WBC: 5.3 10*3/uL (ref 3.4–10.8)

## 2021-11-13 LAB — PRO B NATRIURETIC PEPTIDE: NT-Pro BNP: 1089 pg/mL — ABNORMAL HIGH (ref 0–376)

## 2021-11-13 LAB — TSH: TSH: 6.05 u[IU]/mL — ABNORMAL HIGH (ref 0.450–4.500)

## 2021-11-13 NOTE — Patient Instructions (Signed)
Will discuss with Dr. Valeta Harms about getting bronchoscopy scheduled  Follow up in 8 weeks

## 2021-11-13 NOTE — Progress Notes (Signed)
[facility]  Chief Complaint  Patient presents with   Follow-up    Follow up on CT scan, Bronch was recommended.    Constitutional:  BP 116/70 (BP Location: Left Arm, Patient Position: Sitting, Cuff Size: Normal)   Pulse 78   Temp 98 F (36.7 C) (Oral)   Ht 6' (1.829 m)   Wt 187 lb (84.8 kg)   SpO2 96%   BMI 25.36 kg/m   Past Medical History:  Arthritis, Hypertension, NSCLC cancer dx July 2020, A fib  Past Surgical History:  He  has a past surgical history that includes Knee arthroscopy; Toe Surgery; Tonsillectomy; Total knee arthroplasty (Left, 08/19/2017); Video bronchoscopy (Bilateral, 11/05/2018); Video bronchoscopy (Bilateral, 11/09/2018); and Cardioversion (N/A, 01/11/2019).  Brief Summary:  Joe Cole is a 71 y.o. male former smoker with COPD/emphysema.       Subjective:   He is here with his wife.  He had follow up CT chest.  This showed near complete occlusion of bronchus intermedius with large Rt effusion.  He gets winded with activity.  Not having as much cough, wheeze, or sputum since starting trelegy.  Sleeping okay.  Gets fatigued easily.  Not having chest pain, fever, hemoptysis, or change in voice.  Physical Exam:   Appearance - well kempt   ENMT - no sinus tenderness, no oral exudate, no LAN, Mallampati 3 airway, no stridor  Respiratory - decreased BS at Rt base, no wheeze  CV - s1s2 regular rate and rhythm, no murmurs  Ext - no clubbing, no edema  Skin - no rashes  Psych - normal mood and affect     Pulmonary testing:  PFT 05/04/18 >> FEV1 2.35 (64%), FEV1% 59, TLC 9.00 (121%), RV 4.89 (197%), DLCO 60% A1AT level 05/04/18 >> 167, MM Rt thoracentesis 05/28/21 >> 1.2 liters yellow fluid, cytology negative  Chest Imaging:  CT angio chest 10/20/18 >> Rt hilar LAN 2.2 cm, centrilobular and paraseptal emphysema, mass Rt perihilar region PET scan 1/61/09 >> hypermetabolic Rt hila with postobstructive  collapse of RML, nodule RML, small Rt effusion CT chest 10/15/19 >> small Rt effusion, moderate centrilobular and paraseptal emphysema, changes of XRT Rt perihilar area CT chest 04/13/21 >> near complete collapse of RML, mod Rt effusion, changes of emphysema PET scan 05/15/21 >> collapse of RML, mod Rt effusion CT chest 11/07/21 >> near complete occlusion of bronchus intermedius, large Rt pleural effusion  Cardiac Tests:  Echo 11/06/18 >> EF 50 to 55%, severe LA/RA dilation  Social History:  He  reports that he quit smoking about 3 years ago. His smoking use included cigarettes. He has a 69.00 pack-year smoking history. He has never used smokeless tobacco. He reports current alcohol use of about 15.0 - 16.0 standard drinks of alcohol per week. He reports that he does not use drugs.  Family History:  His family history includes Atrial fibrillation in his mother; Emphysema in his father; Pneumonia in his father.     Assessment/Plan:   Obstruction of bronchus intermedius. - this is concerning recurrence of malignancy - reviewed CT chest findings - will d/w Dr. Valeta Harms about arranging for bronchoscopy and possible endobronchial debulking  COPD with emphysema. - anoro was ineffective - continue trelegy - prn albuterol  Upper airway cough syndrome. - prn flonase  Rt pleural effusion. - cytology negative from thoracentesis on 05/28/21 - likely passive effusion 2nd to RML and RLL volume loss   Stage 3 NSCLC (squamous cell). - s/p  chemotherapy with carboplatin, paclitaxel - started consolidation immunotherapy with imfinzi 02/08/19 - followed by Dr. Curt Bears with [facility] - has occlusion of bronchus intermedius on most recent CT chest   Persistent A fib. - followed by Dr. Candee Furbish with [facility]  Time Spent Involved in Patient Care on Day of Examination:  37 minutes  Follow up:   There are no Patient Instructions on file for this visit.  Medication List:    Allergies as of 11/13/2021   No Known Allergies      Medication List        Accurate as of November 13, 2021  4:42 PM. If you have any questions, ask your nurse or doctor.          atorvastatin 20 MG tablet Commonly known as: LIPITOR Take 20 mg by mouth daily.   diltiazem 360 MG 24 hr capsule Commonly known as: CARDIZEM CD Take 1 capsule (360 mg total) by mouth daily.   Eliquis 5 MG Tabs tablet Generic drug: apixaban TAKE ONE TABLET BY MOUTH TWICE DAILY   furosemide 20 MG tablet Commonly known as: LASIX Take 1 tablet (20 mg total) by mouth daily as needed.   levothyroxine 88 MCG tablet Commonly known as: SYNTHROID Take 88 mcg by mouth daily.   metoprolol succinate 100 MG 24 hr tablet Commonly known as: TOPROL-XL TAKE ONE TABLET BY MOUTH ONCE DAILY WITH OR immediately following A meal   potassium chloride 10 MEQ tablet Commonly known as: KLOR-CON M Take 1 tablet (10 mEq total) by mouth daily as needed.   traZODone 50 MG tablet Commonly known as: DESYREL TAKE 1 TABLET BY MOUTH EVERYDAY AT BEDTIME   Trelegy Ellipta 100-62.5-25 MCG/ACT Aepb Generic drug: Fluticasone-Umeclidin-Vilant Inhale 1 puff into the lungs daily in the afternoon.   Trelegy Ellipta 100-62.5-25 MCG/ACT Aepb Generic drug: Fluticasone-Umeclidin-Vilant Inhale 1 puff into the lungs daily.        Signature:  [name], MD [place] Pager - [phone] 11/13/2021, 4:42 PM

## 2021-11-14 ENCOUNTER — Ambulatory Visit: Payer: Medicare HMO | Admitting: Internal Medicine

## 2021-11-14 ENCOUNTER — Telehealth: Payer: Self-pay | Admitting: Pulmonary Disease

## 2021-11-14 DIAGNOSIS — C3491 Malignant neoplasm of unspecified part of right bronchus or lung: Secondary | ICD-10-CM | POA: Insufficient documentation

## 2021-11-14 NOTE — Telephone Encounter (Signed)
Pt has been scheduled for 7/24 at 1:45.  Have left a vm for him to call me back.

## 2021-11-14 NOTE — Telephone Encounter (Signed)
PCCM:  I called and discussed scheduling of bronchoscopy with patient's wife.  She handles all of his medical appointments.  They are agreeable to proceed with bronchoscopy on Monday, 11/19/2021. I have placed orders for the case.  Patient's last dose of Eliquis will be on Friday. I have instructed him to stop Eliquis last dosed Friday, 11/16/2021.  [facility] 11/14/2021 1:40 PM

## 2021-11-15 ENCOUNTER — Encounter (HOSPITAL_COMMUNITY): Payer: Self-pay | Admitting: Pulmonary Disease

## 2021-11-15 ENCOUNTER — Telehealth: Payer: Self-pay | Admitting: Cardiology

## 2021-11-15 ENCOUNTER — Telehealth: Payer: Self-pay | Admitting: Physician Assistant

## 2021-11-15 ENCOUNTER — Other Ambulatory Visit: Payer: Self-pay

## 2021-11-15 DIAGNOSIS — I4819 Other persistent atrial fibrillation: Secondary | ICD-10-CM

## 2021-11-15 NOTE — Telephone Encounter (Signed)
  Pt c/o medication issue:  1. Name of Medication: lasix   2. How are you currently taking this medication (dosage and times per day)?  3. Are you having a reaction (difficulty breathing--STAT)?   4. What is your medication issue? Pt's wife calling to f/u if pt needs to continue taking this medication

## 2021-11-15 NOTE — Progress Notes (Signed)
Anesthesia Chart Review:  Pt is a same day work up   Case: [id number] Date/Time: 11/19/21 1345   Procedure: VIDEO BRONCHOSCOPY WITHOUT FLUORO (Right)   Anesthesia type: General   Diagnosis: Endobronchial cancer, right ([place]) [C34.91]   Pre-op diagnosis: lung mass   Location: [facility] CARDIOLOGY ROOM 3 / [facility]   Surgeons: Garner Nash, DO       DISCUSSION: Pt is 71 years old with hx atrial fibrillation, HTN, COPD, lung cancer  Pt had cardiology office visit with Ermalinda Barrios, PA on 11/06/21. Echo ordered, is scheduled for 11/20/21. Per secure chat with Dr. Marlou Porch, pt does not need echo prior to procedure 11/19/21   Eliquis last dose 11/16/21  PROVIDERS: - PCP is [name], MD - Cardiologist is Candee Furbish, MD. Last office visit 11/06/21 with Ermalinda Barrios, PA.   LABS: Labs reviewed: Acceptable for surgery. - CMP 11/06/21: normal - CBC 11/06/21: RBC 3.18, platelets 133  IMAGES: CT chest 11/07/21:  1. Progressive right middle lobe and right lower lobe atelectasis. Near complete occlusion of the bronchus intermedius and only a small amount of residual aerated right middle lobe and right lower lobe. Bronchoscopy may be helpful for further evaluation. 2. Persistent large right pleural effusion. 3. No pulmonary lesions or pulmonary nodules. No mediastinal or hilar mass or adenopathy. 4. Stable advanced atherosclerotic calcifications involving the major arterial structures. 5. Stable right adrenal gland adenoma.   EKG 11/06/21: Afib, nonspecific ST changes   CV: Echo 11/06/18:  1. The left ventricle has low normal systolic function, with an ejection fraction of 50-55%. The cavity size was mildly dilated. Left ventricular diastolic Doppler parameters are indeterminate.  2. Extremely poor acoustic windows limit study.  3. The right ventricle was not well visualized. The cavity was moderately enlarged. There is not assessed.  4. RV is not well seen RVEF is at least mildly  depressed.  5. Left atrial size was severely dilated.  6. Right atrial size was severely dilated.  7. The mitral valve is abnormal. Mild thickening of the mitral valve leaflet. There is mild mitral annular calcification present.  8. The tricuspid valve is grossly normal.  9. The aortic valve is abnormal. Mild thickening of the aortic valve. Mild calcification of the aortic valve. Aortic valve regurgitation is trivial by color flow Doppler.  10. The inferior vena cava was dilated in size with <50% respiratory    Past Medical History:  Diagnosis Date   Arthritis    Atrial fibrillation ([place]) 10/2018   COPD with emphysema ([place])    Dyspnea    Hypertension    lung ca dx'd 10/2018    Past Surgical History:  Procedure Laterality Date   CARDIOVERSION N/A 01/11/2019   Procedure: CARDIOVERSION;  Surgeon: Josue Hector, MD;  Location[facility] END[facility]COPY;  Service: Cardiovascular;  Laterality: N/A;   KNEE ARTHROSCOPY     BIL    TOE SURGERY     PINNED LEFT BIG TOE   TONSILLECTOMY     T+A  AS CHILD   TOTAL KNEE ARTHROPLASTY Left 08/19/2017   Procedure: TOTAL KNEE ARTHROPLASTY;  Surgeon: Renette Butters, MD;  Location: [place];  Service: Orthopedics;  Laterality: Left;   VIDEO BRONCHOSCOPY Bilateral 11/05/2018   Procedure: VIDEO BRONCHOSCOPY WITH FLUORO;  Surgeon: [name], MD;  Location: [place];  Service: Cardiopulmonary;  Laterality: Bilateral;   VIDEO BRONCHOSCOPY Bilateral 11/09/2018   Procedure: VIDEO BRONCHOSCOPY WITH FLUORO;  Surgeon: Rigoberto Noel, MD;  Location: [place] ENDOSCOPY;  Service: Cardiopulmonary;  Laterality: Bilateral;    MEDICATIONS: No current facility-administered medications for this encounter.    atorvastatin (LIPITOR) 20 MG tablet   diltiazem (CARDIZEM CD) 360 MG 24 hr capsule   ELIQUIS 5 MG TABS tablet   Fluticasone-Umeclidin-Vilant (TRELEGY ELLIPTA) 100-62.5-25 MCG/ACT AEPB   Fluticasone-Umeclidin-Vilant (TRELEGY ELLIPTA) 100-62.5-25 MCG/ACT AEPB   furosemide  (LASIX) 20 MG tablet   levothyroxine (SYNTHROID) 88 MCG tablet   metoprolol succinate (TOPROL-XL) 100 MG 24 hr tablet   potassium chloride (KLOR-CON M) 10 MEQ tablet   traZODone (DESYREL) 50 MG tablet    If no changes, I anticipate pt can proceed with surgery as scheduled.   Willeen Cass, PhD, FNP-BC [facility]/Anesthesiology Phone: [phone] 11/16/2021 8:45 AM

## 2021-11-15 NOTE — Telephone Encounter (Signed)
See phone note on lab result.

## 2021-11-15 NOTE — Pre-Procedure Instructions (Addendum)
SDW CALL  Patient requested that instructions be given to wife. Patient's wife was given pre-op instructions over the phone. The opportunity was given for the patient and wife to ask questions. No further questions asked. Wife verbalized understanding of instructions given.   PCP - [name], MD Cardiologist - Dr Ferdinand Lango- Dr. [name]  PPM/ICD - denies  Chest x-ray - 06/07/21 EKG - 11/06/21 Stress Test - denies ECHO - ordered from 11/20/21 per wife Cardiac Cath - denies  Sleep Study - denies Blood Thinner Instructions: Eliquis- last dose will be 11/16/21 per wife Aspirin Instructions:N/A  ERAS Protcol - ERAS per order  COVID TEST- DOS   Anesthesia review: yes, heart history, wife states that patient is taking Lasix daily d/t right leg swelling. Wife states that he will have dopplar and ECHO after surgery per Dr. Marlou Porch. Wife states that patient is normally short of breath d/t COPD and feels "normal". No chest pain or other concerning symptoms per wife other than leg swelling.       Surgical Instructions    Your procedure is scheduled on Monday, July 24  Report to [facility] Main Entrance "A" at 10:45 A.M., then check in with the Admitting office.  Call this number if you have problems the morning of surgery:  [phone]    Remember:  Do not eat after midnight the night before your surgery  You may drink clear liquids until 10:45AM the morning of your surgery.   Clear liquids allowed are: Water, Non-Citrus Juices (without pulp), Carbonated Beverages, Clear Tea, Black Coffee ONLY (NO MILK, CREAM OR POWDERED CREAMER of any kind), and Gatorade   Take these medicines the morning of surgery with A SIP OF WATER:  metoprolol succinate (TOPROL-XL) atorvastatin (LIPITOR) diltiazem (CARDIZEM CD) levothyroxine (SYNTHROID) Fluticasone-Umeclidin-Vilant (TRELEGY ELLIPTA)   Your last dose of Eliquis prior to your procedure should be on 11/16/21 per your surgeon's  instructions.   As of today, STOP taking any Aspirin (unless otherwise instructed by your surgeon) Aleve, Naproxen, Ibuprofen, Motrin, Advil, Goody's, BC's, all herbal medications, fish oil, and all vitamins.  [place] is not responsible for any belongings or valuables.   Contacts, glasses, hearing aids, dentures or partials may not be worn into surgery, please bring cases for these belongings   Patients discharged the day of surgery will not be allowed to drive home, and someone needs to stay with them for 24 hours.   SURGICAL WAITING ROOM VISITATION  Patients having surgery or a procedure in a hospital may have two support people in the waiting room. Children under the age of 31 must have an adult with them who is not the patient. They may stay in the waiting area during the procedure and may switch out with other visitors. If the patient needs to stay at the hospital during part of their recovery, the visitor guidelines for inpatient rooms apply.  Please refer to the [facility] website for the visitor guidelines for Inpatients (after your surgery is over and you are in a regular room).     Special instructions:    Oral Hygiene is also important to reduce your risk of infection.  Remember - BRUSH YOUR TEETH THE MORNING OF SURGERY WITH YOUR REGULAR TOOTHPASTE   Day of Surgery:  Take a shower the day of or night before with antibacterial soap. Wear Clean/Comfortable clothing the morning of surgery Do not apply any deodorants/lotions.   Do not wear jewelry or makeup Do not wear lotions, powders, perfumes/colognes, or  deodorant. Do not shave 48 hours prior to surgery.  Men may shave face and neck. Do not bring valuables to the hospital. Do not wear nail polish, gel polish, artificial nails, or any other type of covering on natural nails (fingers and toes) If you have artificial nails or gel coating that need to be removed by a nail salon, please have this removed prior to  surgery. Artificial nails or gel coating may interfere with anesthesia's ability to adequately monitor your vital signs. Remember to brush your teeth WITH YOUR REGULAR TOOTHPASTE.

## 2021-11-15 NOTE — Telephone Encounter (Signed)
Caller stated patient has a Video Bron scheduled on Monday, 7/24 and an Echo on Tuesday, 7/25.  Caller would like to know should the Echo be scheduled first

## 2021-11-15 NOTE — Telephone Encounter (Signed)
Spoke to anesthesiologist and advised to reach out to MD via secure chat. Aware he is off this afternoon but will be able to review question tomorrow. Anesthesiologist agreeable to plan. Will forward this note to MD

## 2021-11-16 ENCOUNTER — Telehealth: Payer: Self-pay | Admitting: Internal Medicine

## 2021-11-16 NOTE — Anesthesia Preprocedure Evaluation (Signed)
Anesthesia Evaluation  Patient identified by MRN, date of birth, ID band Patient awake    Reviewed: Allergy & Precautions, NPO status , Patient's Chart, lab work & pertinent test results  Airway Mallampati: II  TM Distance: >3 FB Neck ROM: Full    Dental  (+) Dental Advisory Given, Chipped,    Pulmonary COPD, former smoker,    Pulmonary exam normal breath sounds clear to auscultation       Cardiovascular hypertension, Pt. on home beta blockers and Pt. on medications Normal cardiovascular exam+ dysrhythmias Atrial Fibrillation  Rhythm:Regular Rate:Normal  TTE 2020 1. The left ventricle has low normal systolic function, with an ejection  fraction of 50-55%. The cavity size was mildly dilated. Left ventricular  diastolic Doppler parameters are indeterminate.  2. Extremely poor acoustic windows limit study.  3. The right ventricle was not well visualized. The cavity was moderately  enlarged. There is not assessed.  4. RV is not well seen RVEF is at least mildly depressed.  5. Left atrial size was severely dilated.  6. Right atrial size was severely dilated.  7. The mitral valve is abnormal. Mild thickening of the mitral valve  leaflet. There is mild mitral annular calcification present.  8. The tricuspid valve is grossly normal.  9. The aortic valve is abnormal. Mild thickening of the aortic valve.  Mild calcification of the aortic valve. Aortic valve regurgitation is  trivial by color flow Doppler.  10. The inferior vena cava was dilated in size with <50% respiratory  variability.    Neuro/Psych negative neurological ROS  negative psych ROS   GI/Hepatic negative GI ROS, Neg liver ROS,   Endo/Other  Hypothyroidism   Renal/GU negative Renal ROS  negative genitourinary   Musculoskeletal  (+) Arthritis ,   Abdominal   Peds  Hematology  (+) Blood dyscrasia (on eliquis), ,   Anesthesia Other Findings    Reproductive/Obstetrics                           Anesthesia Physical Anesthesia Plan  ASA: 3  Anesthesia Plan: General   Post-op Pain Management: Tylenol PO (pre-op)*   Induction: Intravenous  PONV Risk Score and Plan: Dexamethasone, Ondansetron and Treatment may vary due to age or medical condition  Airway Management Planned: Oral ETT  Additional Equipment:   Intra-op Plan:   Post-operative Plan: Extubation in OR  Informed Consent: I have reviewed the patients History and Physical, chart, labs and discussed the procedure including the risks, benefits and alternatives for the proposed anesthesia with the patient or authorized representative who has indicated his/her understanding and acceptance.     Dental advisory given  Plan Discussed with: CRNA  Anesthesia Plan Comments: (See APP note by Durel Salts, FNP )      Anesthesia Quick Evaluation

## 2021-11-16 NOTE — Telephone Encounter (Signed)
Called patient regarding upcoming October appointments, patient has been called and voicemail was left.

## 2021-11-19 ENCOUNTER — Encounter (HOSPITAL_COMMUNITY)
Admission: RE | Disposition: A | Discharge status: Home / Self Care | Payer: Self-pay | Source: Home / Self Care | Attending: Pulmonary Disease

## 2021-11-19 ENCOUNTER — Ambulatory Visit (HOSPITAL_COMMUNITY): Payer: Medicare HMO | Admitting: Emergency Medicine

## 2021-11-19 ENCOUNTER — Ambulatory Visit (HOSPITAL_COMMUNITY)
Admission: RE | Admit: 2021-11-19 | Discharge: 2021-11-19 | Disposition: A | Discharge status: Home / Self Care | Payer: Medicare HMO | Attending: Pulmonary Disease | Admitting: Pulmonary Disease

## 2021-11-19 ENCOUNTER — Encounter (HOSPITAL_COMMUNITY): Payer: Self-pay | Admitting: Pulmonary Disease

## 2021-11-19 ENCOUNTER — Ambulatory Visit (HOSPITAL_BASED_OUTPATIENT_CLINIC_OR_DEPARTMENT_OTHER): Payer: Medicare HMO | Admitting: Emergency Medicine

## 2021-11-19 ENCOUNTER — Other Ambulatory Visit: Payer: Self-pay

## 2021-11-19 ENCOUNTER — Ambulatory Visit (HOSPITAL_COMMUNITY): Payer: Medicare HMO

## 2021-11-19 DIAGNOSIS — I700 Atherosclerosis of aorta: Secondary | ICD-10-CM | POA: Diagnosis not present

## 2021-11-19 DIAGNOSIS — I10 Essential (primary) hypertension: Secondary | ICD-10-CM | POA: Diagnosis not present

## 2021-11-19 DIAGNOSIS — Z87891 Personal history of nicotine dependence: Secondary | ICD-10-CM

## 2021-11-19 DIAGNOSIS — C3411 Malignant neoplasm of upper lobe, right bronchus or lung: Secondary | ICD-10-CM | POA: Diagnosis not present

## 2021-11-19 DIAGNOSIS — I4891 Unspecified atrial fibrillation: Secondary | ICD-10-CM | POA: Diagnosis not present

## 2021-11-19 DIAGNOSIS — C3491 Malignant neoplasm of unspecified part of right bronchus or lung: Secondary | ICD-10-CM | POA: Diagnosis present

## 2021-11-19 DIAGNOSIS — Z7901 Long term (current) use of anticoagulants: Secondary | ICD-10-CM | POA: Diagnosis not present

## 2021-11-19 DIAGNOSIS — J449 Chronic obstructive pulmonary disease, unspecified: Secondary | ICD-10-CM | POA: Diagnosis not present

## 2021-11-19 DIAGNOSIS — Z85118 Personal history of other malignant neoplasm of bronchus and lung: Secondary | ICD-10-CM | POA: Diagnosis not present

## 2021-11-19 DIAGNOSIS — R918 Other nonspecific abnormal finding of lung field: Secondary | ICD-10-CM | POA: Diagnosis not present

## 2021-11-19 DIAGNOSIS — D759 Disease of blood and blood-forming organs, unspecified: Secondary | ICD-10-CM | POA: Diagnosis not present

## 2021-11-19 DIAGNOSIS — Z20822 Contact with and (suspected) exposure to covid-19: Secondary | ICD-10-CM | POA: Insufficient documentation

## 2021-11-19 DIAGNOSIS — J90 Pleural effusion, not elsewhere classified: Secondary | ICD-10-CM | POA: Insufficient documentation

## 2021-11-19 DIAGNOSIS — R846 Abnormal cytological findings in specimens from respiratory organs and thorax: Secondary | ICD-10-CM | POA: Diagnosis not present

## 2021-11-19 DIAGNOSIS — E039 Hypothyroidism, unspecified: Secondary | ICD-10-CM | POA: Diagnosis not present

## 2021-11-19 HISTORY — PX: BRONCHIAL WASHINGS: SHX5105

## 2021-11-19 HISTORY — PX: VIDEO BRONCHOSCOPY: SHX5072

## 2021-11-19 HISTORY — PX: THORACENTESIS: SHX235

## 2021-11-19 HISTORY — PX: BRONCHIAL BIOPSY: SHX5109

## 2021-11-19 LAB — PROTEIN, PLEURAL OR PERITONEAL FLUID: Total protein, fluid: 3.4 g/dL

## 2021-11-19 LAB — BODY FLUID CELL COUNT WITH DIFFERENTIAL
Eos, Fluid: 0 %
Lymphs, Fluid: 80 %
Monocyte-Macrophage-Serous Fluid: 11 % — ABNORMAL LOW (ref 50–90)
Neutrophil Count, Fluid: 9 % (ref 0–25)
Total Nucleated Cell Count, Fluid: 417 cu mm (ref 0–1000)

## 2021-11-19 LAB — ALBUMIN, PLEURAL OR PERITONEAL FLUID: Albumin, Fluid: 2.1 g/dL

## 2021-11-19 LAB — LACTATE DEHYDROGENASE, PLEURAL OR PERITONEAL FLUID: LD, Fluid: 83 U/L — ABNORMAL HIGH (ref 3–23)

## 2021-11-19 LAB — SARS CORONAVIRUS 2 BY RT PCR: SARS Coronavirus 2 by RT PCR: NEGATIVE

## 2021-11-19 SURGERY — VIDEO BRONCHOSCOPY WITHOUT FLUORO
Anesthesia: General | Laterality: Right

## 2021-11-19 MED ORDER — ACETAMINOPHEN 500 MG PO TABS: 1000.0000 mg | ORAL_TABLET | Freq: Once | ORAL | Status: DC

## 2021-11-19 MED ORDER — PROPOFOL 10 MG/ML IV BOLUS
INTRAVENOUS | Status: DC | PRN
  Administered 2021-11-19: 150 mg via INTRAVENOUS

## 2021-11-19 MED ORDER — LACTATED RINGERS IV SOLN
INTRAVENOUS | Status: DC
Start: 2021-11-19 — End: 2021-11-19

## 2021-11-19 MED ORDER — SUGAMMADEX SODIUM 200 MG/2ML IV SOLN
INTRAVENOUS | Status: DC | PRN
  Administered 2021-11-19: 200 mg via INTRAVENOUS

## 2021-11-19 MED ORDER — ONDANSETRON HCL 4 MG/2ML IJ SOLN
INTRAMUSCULAR | Status: DC | PRN
  Administered 2021-11-19: 4 mg via INTRAVENOUS

## 2021-11-19 MED ORDER — LIDOCAINE 2% (20 MG/ML) 5 ML SYRINGE
INTRAMUSCULAR | Status: DC | PRN
  Administered 2021-11-19: 60 mg via INTRAVENOUS

## 2021-11-19 MED ORDER — ROCURONIUM BROMIDE 10 MG/ML (PF) SYRINGE
PREFILLED_SYRINGE | INTRAVENOUS | Status: DC | PRN
  Administered 2021-11-19: 70 mg via INTRAVENOUS

## 2021-11-19 MED ORDER — FENTANYL CITRATE (PF) 100 MCG/2ML IJ SOLN: 25.0000 ug | INTRAMUSCULAR | Status: DC | PRN

## 2021-11-19 NOTE — Op Note (Signed)
Thoracentesis  Procedure Note  Joe Cole  [id number]  [birth year]  Date:11/19/21  Time:3:54 PM   Provider Performing:Lyal Husted L Sheridan Hew   Procedure: Thoracentesis with imaging guidance (39030)  Indication(s) Pleural Effusion  Consent Risks of the procedure as well as the alternatives and risks of each were explained to the patient and/or caregiver.  Consent for the procedure was obtained and is signed in the bedside chart  Anesthesia Topical only with 1% lidocaine   Time Out Verified patient identification, verified procedure, site/side was marked, verified correct patient position, special equipment/implants available, medications/allergies/relevant history reviewed, required imaging and test results available.  Sterile Technique Maximal sterile technique including full sterile barrier drape, hand hygiene, sterile gown, sterile gloves, mask, hair covering, sterile ultrasound probe cover (if used).  Procedure Description Ultrasound was used to identify appropriate pleural anatomy for placement and overlying skin marked.  Area of drainage cleaned and draped in sterile fashion. Lidocaine was used to anesthetize the skin and subcutaneous tissue.  1500 cc's of amber appearing fluid was drained from the right pleural space. Catheter then removed and bandaid applied to site.  Complications/Tolerance None; patient tolerated the procedure well. Chest X-ray is ordered to confirm no post-procedural complication.  EBL Minimal  Specimen(s) Pleural fluid   Garner Nash, DO  Pulmonary Critical Care 11/19/2021 3:54 PM

## 2021-11-19 NOTE — Transfer of Care (Signed)
Immediate Anesthesia Transfer of Care Note  Patient: Joe Cole  Procedure(s) Performed: VIDEO BRONCHOSCOPY WITHOUT FLUORO (Right) BRONCHIAL BIOPSIES THORACENTESIS BRONCHIAL WASHINGS  Patient Location: PACU  Anesthesia Type:General  Level of Consciousness: drowsy and patient cooperative  Airway & Oxygen Therapy: Patient Spontanous Breathing  Post-op Assessment: Report given to RN and Post -op Vital signs reviewed and stable  Post vital signs: Reviewed and stable  Last Vitals:  Vitals Value Taken Time  BP    Temp    Pulse 79 11/19/21 1542  Resp    SpO2 97 % 11/19/21 1542  Vitals shown include unvalidated device data.  Last Pain:  Vitals:   11/19/21 1104  TempSrc:   PainSc: 0-No pain         Complications: No notable events documented.

## 2021-11-19 NOTE — H&P (View-Only) (Signed)
Synopsis: Referred in July 2023 for abnormal CT by No ref. provider found  Subjective:   PATIENT ID: Joe Cole GENDER: male DOB: [birth year], MRN: [id number]  Chief complaint, right lung mass  This is a 71 year old gentleman past medical history of atrial fibrillation, COPD, dyspnea, hypertension, lung cancer.  Patient had a recent CT scan of the chest after a previous diagnosis of lung cancer follows with Dr. Earlie Server.  Patient had a repeat CT scan that was concerning for endobronchial recurrence on the right side.  Patient was referred for consideration of repeat bronchoscopy tissue sampling, possible tumor debulking due to recurrent malignancy.   Past Medical History:  Diagnosis Date   Arthritis    Atrial fibrillation ([place]) 10/2018   COPD with emphysema ([place])    Dyspnea    Hypertension    lung ca dx'd 10/2018     Family History  Problem Relation Age of Onset   Atrial fibrillation Mother    Emphysema Father    Pneumonia Father      Past Surgical History:  Procedure Laterality Date   CARDIOVERSION N/A 01/11/2019   Procedure: CARDIOVERSION;  Surgeon: Josue Hector, MD;  Location: [facility];  Service: Cardiovascular;  Laterality: N/A;   CATARACT EXTRACTION W/ INTRAOCULAR LENS IMPLANT Bilateral    KNEE ARTHROSCOPY     BIL    TOE SURGERY     PINNED LEFT BIG TOE   TONSILLECTOMY     T+A  AS CHILD   TOTAL KNEE ARTHROPLASTY Left 08/19/2017   Procedure: TOTAL KNEE ARTHROPLASTY;  Surgeon: Renette Butters, MD;  Location: [place];  Service: Orthopedics;  Laterality: Left;   VIDEO BRONCHOSCOPY Bilateral 11/05/2018   Procedure: VIDEO BRONCHOSCOPY WITH FLUORO;  Surgeon: [name], MD;  Location: [place];  Service: Cardiopulmonary;  Laterality: Bilateral;   VIDEO BRONCHOSCOPY Bilateral 11/09/2018   Procedure: VIDEO BRONCHOSCOPY WITH FLUORO;  Surgeon: Rigoberto Noel, MD;  Location: [place];  Service: Cardiopulmonary;  Laterality: Bilateral;    Social History    Socioeconomic History   Marital status: Married    Spouse name: Not on file   Number of children: Not on file   Years of education: Not on file   Highest education level: Not on file  Occupational History   Not on file  Tobacco Use   Smoking status: Former    Packs/day: 1.50    Years: 46.00    Total pack years: 69.00    Types: Cigarettes    Quit date: 09/08/2018    Years since quitting: 3.2   Smokeless tobacco: Never  Vaping Use   Vaping Use: Never used  Substance and Sexual Activity   Alcohol use: Yes    Alcohol/week: 2.0 - 3.0 standard drinks of alcohol    Types: 2 - 3 Shots of liquor per week    Comment: FEW DRINKS AFTER WORK   Drug use: Never   Sexual activity: Not on file  Other Topics Concern   Not on file  Social History Narrative   Not on file   Social Determinants of Health   Financial Resource Strain: Not on file  Food Insecurity: Not on file  Transportation Needs: Not on file  Physical Activity: Not on file  Stress: Not on file  Social Connections: Not on file  Intimate Partner Violence: Not on file     No Known Allergies   @ENCMEDSTART @  Review of Systems  Constitutional:  Negative for chills, fever, malaise/fatigue and weight loss.  HENT:  Negative for hearing loss, sore throat and tinnitus.   Eyes:  Negative for blurred vision and double vision.  Respiratory:  Negative for cough, hemoptysis, sputum production, shortness of breath, wheezing and stridor.   Cardiovascular:  Negative for chest pain, palpitations, orthopnea, leg swelling and PND.  Gastrointestinal:  Negative for abdominal pain, constipation, diarrhea, heartburn, nausea and vomiting.  Genitourinary:  Negative for dysuria, hematuria and urgency.  Musculoskeletal:  Negative for joint pain and myalgias.  Skin:  Negative for itching and rash.  Neurological:  Negative for dizziness, tingling, weakness and headaches.  Endo/Heme/Allergies:  Negative for environmental allergies. Does not  bruise/bleed easily.  Psychiatric/Behavioral:  Negative for depression. The patient is not nervous/anxious and does not have insomnia.   All other systems reviewed and are negative.    Objective:  Physical Exam Vitals reviewed.  Constitutional:      General: He is not in acute distress.    Appearance: He is well-developed.  HENT:     Head: Normocephalic and atraumatic.  Eyes:     General: No scleral icterus.    Conjunctiva/sclera: Conjunctivae normal.     Pupils: Pupils are equal, round, and reactive to light.  Neck:     Vascular: No JVD.     Trachea: No tracheal deviation.  Cardiovascular:     Rate and Rhythm: Normal rate and regular rhythm.     Heart sounds: Normal heart sounds. No murmur heard. Pulmonary:     Effort: Pulmonary effort is normal. No tachypnea, accessory muscle usage or respiratory distress.     Breath sounds: No stridor. No wheezing, rhonchi or rales.     Comments: Diminished right lower lobe breath sounds Abdominal:     General: There is no distension.     Palpations: Abdomen is soft.     Tenderness: There is no abdominal tenderness.  Musculoskeletal:        General: No tenderness.     Cervical back: Neck supple.  Lymphadenopathy:     Cervical: No cervical adenopathy.  Skin:    General: Skin is warm and dry.     Capillary Refill: Capillary refill takes less than 2 seconds.     Findings: No rash.  Neurological:     Mental Status: He is alert and oriented to person, place, and time.  Psychiatric:        Behavior: Behavior normal.     Vitals:   11/19/21 1047  BP: (!) 175/97  Pulse: 73  Resp: 18  Temp: 99.2 F (37.3 C)  TempSrc: Oral  SpO2: 99%  Weight: 84.8 kg  Height: 6' (1.829 m)   99% on RA BMI Readings from Last 3 Encounters:  11/19/21 25.36 kg/m  11/13/21 25.36 kg/m  11/08/21 24.70 kg/m   Wt Readings from Last 3 Encounters:  11/19/21 84.8 kg  11/13/21 84.8 kg  11/08/21 84.9 kg     CBC    Component Value Date/Time    WBC 6.1 11/06/2021 1153   WBC 5.3 11/06/2021 0840   WBC 5.3 11/09/2018 0556   RBC 3.04 (L) 11/06/2021 1153   RBC 3.18 (L) 11/06/2021 0840   HGB 13.2 11/06/2021 1153   HGB 13.6 11/06/2021 0840   HCT 36.0 (L) 11/06/2021 1153   HCT 37.6 11/06/2021 0840   PLT 117 (L) 11/06/2021 1153   PLT 133 (L) 11/06/2021 0840   MCV 118.4 (H) 11/06/2021 1153   MCV 118 (H) 11/06/2021 0840   MCH 43.4 (H) 11/06/2021 1153   MCH 42.8 (H) 11/06/2021  0840   MCHC 36.7 (H) 11/06/2021 1153   MCHC 36.2 (H) 11/06/2021 0840   RDW 14.9 11/06/2021 1153   RDW 14.9 11/06/2021 0840   LYMPHSABS 1.1 11/06/2021 1153   MONOABS 0.6 11/06/2021 1153   EOSABS 0.1 11/06/2021 1153   BASOSABS 0.1 11/06/2021 1153    Chest Imaging:  CT chest 11/06/2021: Progressive right middle lobe right lower lobe atelectasis with occlusion of the right bronchus intermedius and lower lobe concerning for recurrent malignancy.  Patient has persistent large right-sided pleural effusion. The patient's images have been independently reviewed by me.    Pulmonary Functions Testing Results:    Latest Ref Rng & Units 05/04/2018    1:41 PM  PFT Results  FVC-Pre L 3.98   FVC-Predicted Pre % 81   Pre FEV1/FVC % % 59   FEV1-Pre L 2.35   FEV1-Predicted Pre % 64   DLCO uncorrected ml/min/mmHg 21.11   DLCO UNC% % 60   DLVA Predicted % 70   TLC L 9.00   TLC % Predicted % 121   RV % Predicted % 197     FeNO:   Pathology:   Echocardiogram:   Heart Catheterization:     Assessment & Plan:   History of lung cancer Right endobronchial lesion, concern for recurrent malignancy Large right-sided pleural effusion.  Discussion: Today we discussed the risk benefits alternatives of proceeding with bronchoscopy to include bleeding and pneumothorax related to cryotherapy if there is visible endobronchial lesion. We also discussed the risk benefits alternatives of proceeding with thoracentesis and drainage of the pleural fluid.   Current  Facility-Administered Medications:    lactated ringers infusion, , Intravenous, Continuous, Duane Boston, MD, Last Rate: 10 mL/hr at 11/19/21 1138, New Bag at 11/19/21 Lander Dachelle Molzahn, DO [place] 11/19/2021 12:06 PM

## 2021-11-19 NOTE — Op Note (Signed)
Video Bronchoscopy Procedure Note  Date of Operation: 11/19/2021  Pre-op Diagnosis: history of lung cancer, pleural effusion   Post-op Diagnosis: history of lung cancer, pleural effusion   Surgeon: Garner Nash, DO   Assistants: none  Anesthesia: General   Operation: Flexible video fiberoptic bronchoscopy and biopsies.  Estimated Blood Loss: <4TG  Complications: none noted  Indications and History: Joe Cole is 71 y.o. with history of lung nodule, adenopathy.  Recommendation was to perform video fiberoptic bronchoscopy with biopsies. The risks, benefits, complications, treatment options and expected outcomes were discussed with the patient.  The possibilities of pneumothorax, pneumonia, reaction to medication, pulmonary aspiration, perforation of a viscus, bleeding, failure to diagnose a condition and creating a complication requiring transfusion or operation were discussed with the patient who freely signed the consent.    Description of Procedure: The patient was seen in the Preoperative Area, was examined and was deemed appropriate to proceed.  The patient was taken to [facility], identified as [name] and the procedure verified as Flexible Video Fiberoptic Bronchoscopy.  A Time Out was held and the above information confirmed.   The video fiberoptic bronchoscope was introduced via the ETT and a general inspection was performed which showed normal trachea, normal main carina.  The left lung appeared normal, the right lung appeared normal, right lower lobe no evidence of endobronchial disease, right middle lobe no evidence of endobronchial disease, mucosa appeared white.  There was visible endobronchial tumor in the right upper lobe apical segment.  Irregular raised mucosa was biopsied using endobronchial forceps.  Samples: 1.  Endobronchial forcep biopsies right upper lobe 2.  Bronchoalveolar lavage right upper lobe  Plans:  We will review the cytology, pathology  results with the patient when they become available.  Outpatient followup will be with Dr. Halford Chessman.  [place] [facility] 11/19/2021 3:46 PM

## 2021-11-19 NOTE — Anesthesia Procedure Notes (Signed)
Procedure Name: Intubation Date/Time: 11/19/2021 2:55 PM  Performed by: Georgia Duff, CRNAPre-anesthesia Checklist: Patient identified, Emergency Drugs available, Suction available and Patient being monitored Patient Re-evaluated:Patient Re-evaluated prior to induction Oxygen Delivery Method: Circle System Utilized Preoxygenation: Pre-oxygenation with 100% oxygen Induction Type: IV induction Ventilation: Mask ventilation without difficulty Laryngoscope Size: Miller and 2 Grade View: Grade I Tube type: Oral Number of attempts: 1 Airway Equipment and Method: Stylet and Oral airway Placement Confirmation: ETT inserted through vocal cords under direct vision, positive ETCO2 and breath sounds checked- equal and bilateral Secured at: 21 cm Tube secured with: Tape Dental Injury: Teeth and Oropharynx as per pre-operative assessment

## 2021-11-19 NOTE — Consult Note (Addendum)
Synopsis: Referred in July 2023 for abnormal CT by No ref. provider found  Subjective:   PATIENT ID: Joe Cole GENDER: male DOB: 03/10/1951, MRN: [id number]  Chief complaint, right lung mass  This is a 71 year old gentleman past medical history of atrial fibrillation, COPD, dyspnea, hypertension, lung cancer.  Patient had a recent CT scan of the chest after a previous diagnosis of lung cancer follows with Dr. Earlie Server.  Patient had a repeat CT scan that was concerning for endobronchial recurrence on the right side.  Patient was referred for consideration of repeat bronchoscopy tissue sampling, possible tumor debulking due to recurrent malignancy.   Past Medical History:  Diagnosis Date   Arthritis    Atrial fibrillation ([place]) 10/2018   COPD with emphysema ([place])    Dyspnea    Hypertension    lung ca dx'd 10/2018     Family History  Problem Relation Age of Onset   Atrial fibrillation Mother    Emphysema Father    Pneumonia Father      Past Surgical History:  Procedure Laterality Date   CARDIOVERSION N/A 01/11/2019   Procedure: CARDIOVERSION;  Surgeon: Josue Hector, MD;  Location: [facility];  Service: Cardiovascular;  Laterality: N/A;   CATARACT EXTRACTION W/ INTRAOCULAR LENS IMPLANT Bilateral    KNEE ARTHROSCOPY     BIL    TOE SURGERY     PINNED LEFT BIG TOE   TONSILLECTOMY     T+A  AS CHILD   TOTAL KNEE ARTHROPLASTY Left 08/19/2017   Procedure: TOTAL KNEE ARTHROPLASTY;  Surgeon: Renette Butters, MD;  Location: [facility];  Service: Orthopedics;  Laterality: Left;   VIDEO BRONCHOSCOPY Bilateral 11/05/2018   Procedure: VIDEO BRONCHOSCOPY WITH FLUORO;  Surgeon: [name], MD;  Location: [facility];  Service: Cardiopulmonary;  Laterality: Bilateral;   VIDEO BRONCHOSCOPY Bilateral 11/09/2018   Procedure: VIDEO BRONCHOSCOPY WITH FLUORO;  Surgeon: Rigoberto Noel, MD;  Location: [facility];  Service: Cardiopulmonary;  Laterality: Bilateral;    Social History    Socioeconomic History   Marital status: Married    Spouse name: Not on file   Number of children: Not on file   Years of education: Not on file   Highest education level: Not on file  Occupational History   Not on file  Tobacco Use   Smoking status: Former    Packs/day: 1.50    Years: 46.00    Total pack years: 69.00    Types: Cigarettes    Quit date: 09/08/2018    Years since quitting: 3.2   Smokeless tobacco: Never  Vaping Use   Vaping Use: Never used  Substance and Sexual Activity   Alcohol use: Yes    Alcohol/week: 2.0 - 3.0 standard drinks of alcohol    Types: 2 - 3 Shots of liquor per week    Comment: FEW DRINKS AFTER WORK   Drug use: Never   Sexual activity: Not on file  Other Topics Concern   Not on file  Social History Narrative   Not on file   Social Determinants of Health   Financial Resource Strain: Not on file  Food Insecurity: Not on file  Transportation Needs: Not on file  Physical Activity: Not on file  Stress: Not on file  Social Connections: Not on file  Intimate Partner Violence: Not on file     No Known Allergies   @ENCMEDSTART @  Review of Systems  Constitutional:  Negative for chills, fever, malaise/fatigue and weight loss.  HENT:  Negative for hearing loss, sore throat and tinnitus.   Eyes:  Negative for blurred vision and double vision.  Respiratory:  Negative for cough, hemoptysis, sputum production, shortness of breath, wheezing and stridor.   Cardiovascular:  Negative for chest pain, palpitations, orthopnea, leg swelling and PND.  Gastrointestinal:  Negative for abdominal pain, constipation, diarrhea, heartburn, nausea and vomiting.  Genitourinary:  Negative for dysuria, hematuria and urgency.  Musculoskeletal:  Negative for joint pain and myalgias.  Skin:  Negative for itching and rash.  Neurological:  Negative for dizziness, tingling, weakness and headaches.  Endo/Heme/Allergies:  Negative for environmental allergies. Does not  bruise/bleed easily.  Psychiatric/Behavioral:  Negative for depression. The patient is not nervous/anxious and does not have insomnia.   All other systems reviewed and are negative.    Objective:  Physical Exam Vitals reviewed.  Constitutional:      General: He is not in acute distress.    Appearance: He is well-developed.  HENT:     Head: Normocephalic and atraumatic.  Eyes:     General: No scleral icterus.    Conjunctiva/sclera: Conjunctivae normal.     Pupils: Pupils are equal, round, and reactive to light.  Neck:     Vascular: No JVD.     Trachea: No tracheal deviation.  Cardiovascular:     Rate and Rhythm: Normal rate and regular rhythm.     Heart sounds: Normal heart sounds. No murmur heard. Pulmonary:     Effort: Pulmonary effort is normal. No tachypnea, accessory muscle usage or respiratory distress.     Breath sounds: No stridor. No wheezing, rhonchi or rales.     Comments: Diminished right lower lobe breath sounds Abdominal:     General: There is no distension.     Palpations: Abdomen is soft.     Tenderness: There is no abdominal tenderness.  Musculoskeletal:        General: No tenderness.     Cervical back: Neck supple.  Lymphadenopathy:     Cervical: No cervical adenopathy.  Skin:    General: Skin is warm and dry.     Capillary Refill: Capillary refill takes less than 2 seconds.     Findings: No rash.  Neurological:     Mental Status: He is alert and oriented to person, place, and time.  Psychiatric:        Behavior: Behavior normal.     Vitals:   11/19/21 1047  BP: (!) 175/97  Pulse: 73  Resp: 18  Temp: 99.2 F (37.3 C)  TempSrc: Oral  SpO2: 99%  Weight: 84.8 kg  Height: 6' (1.829 m)   99% on RA BMI Readings from Last 3 Encounters:  11/19/21 25.36 kg/m  11/13/21 25.36 kg/m  11/08/21 24.70 kg/m   Wt Readings from Last 3 Encounters:  11/19/21 84.8 kg  11/13/21 84.8 kg  11/08/21 84.9 kg     CBC    Component Value Date/Time    WBC 6.1 11/06/2021 1153   WBC 5.3 11/06/2021 0840   WBC 5.3 11/09/2018 0556   RBC 3.04 (L) 11/06/2021 1153   RBC 3.18 (L) 11/06/2021 0840   HGB 13.2 11/06/2021 1153   HGB 13.6 11/06/2021 0840   HCT 36.0 (L) 11/06/2021 1153   HCT 37.6 11/06/2021 0840   PLT 117 (L) 11/06/2021 1153   PLT 133 (L) 11/06/2021 0840   MCV 118.4 (H) 11/06/2021 1153   MCV 118 (H) 11/06/2021 0840   MCH 43.4 (H) 11/06/2021 1153   MCH 42.8 (H) 11/06/2021  0840   MCHC 36.7 (H) 11/06/2021 1153   MCHC 36.2 (H) 11/06/2021 0840   RDW 14.9 11/06/2021 1153   RDW 14.9 11/06/2021 0840   LYMPHSABS 1.1 11/06/2021 1153   MONOABS 0.6 11/06/2021 1153   EOSABS 0.1 11/06/2021 1153   BASOSABS 0.1 11/06/2021 1153    Chest Imaging:  CT chest 11/06/2021: Progressive right middle lobe right lower lobe atelectasis with occlusion of the right bronchus intermedius and lower lobe concerning for recurrent malignancy.  Patient has persistent large right-sided pleural effusion. The patient's images have been independently reviewed by me.    Pulmonary Functions Testing Results:    Latest Ref Rng & Units 05/04/2018    1:41 PM  PFT Results  FVC-Pre L 3.98   FVC-Predicted Pre % 81   Pre FEV1/FVC % % 59   FEV1-Pre L 2.35   FEV1-Predicted Pre % 64   DLCO uncorrected ml/min/mmHg 21.11   DLCO UNC% % 60   DLVA Predicted % 70   TLC L 9.00   TLC % Predicted % 121   RV % Predicted % 197     FeNO:   Pathology:   Echocardiogram:   Heart Catheterization:     Assessment & Plan:   History of lung cancer Right endobronchial lesion, concern for recurrent malignancy Large right-sided pleural effusion.  Discussion: Today we discussed the risk benefits alternatives of proceeding with bronchoscopy to include bleeding and pneumothorax related to cryotherapy if there is visible endobronchial lesion. We also discussed the risk benefits alternatives of proceeding with thoracentesis and drainage of the pleural fluid.   Current  Facility-Administered Medications:    lactated ringers infusion, , Intravenous, Continuous, Duane Boston, MD, Last Rate: 10 mL/hr at 11/19/21 1138, New Bag at 11/19/21 [employer] Merrik [place], DO [facility] 11/19/2021 12:06 PM

## 2021-11-19 NOTE — Interval H&P Note (Signed)
History and Physical Interval Note:  11/19/2021 2:29 PM  Joe Cole  has presented today for surgery, with the diagnosis of lung mass.  The various methods of treatment have been discussed with the patient and family. After consideration of risks, benefits and other options for treatment, the patient has consented to  Procedure(s): VIDEO BRONCHOSCOPY WITHOUT FLUORO (Right) as a surgical intervention.  The patient's history has been reviewed, patient examined, no change in status, stable for surgery.  I have reviewed the patient's chart and labs.  Questions were answered to the patient's satisfaction.     [place]

## 2021-11-19 NOTE — Discharge Instructions (Signed)
Flexible Bronchoscopy, Care After This sheet gives you information about how to care for yourself after your test. Your doctor may also give you more specific instructions. If you have problems or questions, contact your doctor. Follow these instructions at home: Eating and drinking Do not eat or drink anything (not even water) for 2 hours after your test, or until your numbing medicine (local anesthetic) wears off. When your numbness is gone and your cough and gag reflexes have come back, you may: Eat only soft foods. Slowly drink liquids. The day after the test, go back to your normal diet. Driving Do not drive for 24 hours if you were given a medicine to help you relax (sedative). Do not drive or use heavy machinery while taking prescription pain medicine. General instructions  Take over-the-counter and prescription medicines only as told by your doctor. Return to your normal activities as told. Ask what activities are safe for you. Do not use any products that have nicotine or tobacco in them. This includes cigarettes and e-cigarettes. If you need help quitting, ask your doctor. Keep all follow-up visits as told by your doctor. This is important. It is very important if you had a tissue sample (biopsy) taken. Get help right away if: You have shortness of breath that gets worse. You get light-headed. You feel like you are going to pass out (faint). You have chest pain. You cough up: More than a little blood. More blood than before. Summary Do not eat or drink anything (not even water) for 2 hours after your test, or until your numbing medicine wears off. Do not use cigarettes. Do not use e-cigarettes. Get help right away if you have chest pain.  This information is not intended to replace advice given to you by your health care provider. Make sure you discuss any questions you have with your health care provider. Document Released: 02/10/2009 Document Revised: 03/28/2017 Document  Reviewed: 05/03/2016 Elsevier Patient Education  2020 [employer].

## 2021-11-20 ENCOUNTER — Encounter (HOSPITAL_COMMUNITY): Payer: Self-pay | Admitting: Pulmonary Disease

## 2021-11-20 ENCOUNTER — Ambulatory Visit (HOSPITAL_BASED_OUTPATIENT_CLINIC_OR_DEPARTMENT_OTHER): Payer: Medicare HMO

## 2021-11-20 ENCOUNTER — Other Ambulatory Visit: Payer: Self-pay

## 2021-11-20 ENCOUNTER — Other Ambulatory Visit: Payer: Medicare HMO

## 2021-11-20 ENCOUNTER — Ambulatory Visit (HOSPITAL_COMMUNITY)
Admission: RE | Admit: 2021-11-20 | Discharge: 2021-11-20 | Disposition: A | Discharge status: Home / Self Care | Payer: Medicare HMO | Source: Ambulatory Visit | Attending: Cardiovascular Disease | Admitting: Cardiovascular Disease

## 2021-11-20 DIAGNOSIS — I739 Peripheral vascular disease, unspecified: Secondary | ICD-10-CM | POA: Diagnosis not present

## 2021-11-20 DIAGNOSIS — R0602 Shortness of breath: Secondary | ICD-10-CM

## 2021-11-20 DIAGNOSIS — I4819 Other persistent atrial fibrillation: Secondary | ICD-10-CM

## 2021-11-20 LAB — ECHOCARDIOGRAM COMPLETE
Area-P 1/2: 3.46 cm2
P 1/2 time: 706 msec
S' Lateral: 3.4 cm

## 2021-11-20 LAB — BASIC METABOLIC PANEL
BUN/Creatinine Ratio: 13 (ref 10–24)
BUN: 14 mg/dL (ref 8–27)
CO2: 22 mmol/L (ref 20–29)
Calcium: 8.8 mg/dL (ref 8.6–10.2)
Chloride: 94 mmol/L — ABNORMAL LOW (ref 96–106)
Creatinine, Ser: 1.04 mg/dL (ref 0.76–1.27)
Glucose: 106 mg/dL — ABNORMAL HIGH (ref 70–99)
Potassium: 5.1 mmol/L (ref 3.5–5.2)
Sodium: 131 mmol/L — ABNORMAL LOW (ref 134–144)
eGFR: 77 mL/min/{1.73_m2} (ref >59)

## 2021-11-20 NOTE — Anesthesia Postprocedure Evaluation (Signed)
Anesthesia Post Note  Patient: Joe Cole  Procedure(s) Performed: VIDEO BRONCHOSCOPY WITHOUT FLUORO (Right) BRONCHIAL BIOPSIES THORACENTESIS BRONCHIAL WASHINGS     Patient location during evaluation: PACU Anesthesia Type: General Level of consciousness: awake and alert Pain management: pain level controlled Vital Signs Assessment: post-procedure vital signs reviewed and stable Respiratory status: spontaneous breathing, nonlabored ventilation, respiratory function stable and patient connected to nasal cannula oxygen Cardiovascular status: blood pressure returned to baseline and stable Postop Assessment: no apparent nausea or vomiting Anesthetic complications: no   No notable events documented.  Last Vitals:  Vitals:   11/19/21 1552 11/19/21 1602  BP: 132/70 124/73  Pulse: 73 77  Resp: 19 19  Temp:    SpO2: 94% 95%    Last Pain:  Vitals:   11/19/21 1602  TempSrc:   PainSc: 0-No pain                 Belenda Cruise P Kathya Wilz

## 2021-11-21 ENCOUNTER — Encounter: Payer: Self-pay | Admitting: Physician Assistant

## 2021-11-21 ENCOUNTER — Ambulatory Visit (INDEPENDENT_AMBULATORY_CARE_PROVIDER_SITE_OTHER): Payer: Medicare HMO | Admitting: Physician Assistant

## 2021-11-21 VITALS — BP 96/60 | HR 83 | Ht 72.0 in | Wt 182.0 lb

## 2021-11-21 DIAGNOSIS — I700 Atherosclerosis of aorta: Secondary | ICD-10-CM

## 2021-11-21 DIAGNOSIS — C3491 Malignant neoplasm of unspecified part of right bronchus or lung: Secondary | ICD-10-CM

## 2021-11-21 DIAGNOSIS — E7800 Pure hypercholesterolemia, unspecified: Secondary | ICD-10-CM | POA: Diagnosis not present

## 2021-11-21 DIAGNOSIS — R600 Localized edema: Secondary | ICD-10-CM | POA: Diagnosis not present

## 2021-11-21 DIAGNOSIS — I4819 Other persistent atrial fibrillation: Secondary | ICD-10-CM | POA: Diagnosis not present

## 2021-11-21 DIAGNOSIS — J449 Chronic obstructive pulmonary disease, unspecified: Secondary | ICD-10-CM | POA: Diagnosis not present

## 2021-11-21 NOTE — Patient Instructions (Signed)
Medication Instructions:  Your physician recommends that you continue on your current medications as directed. Please refer to the Current Medication list given to you today.  *If you need a refill on your cardiac medications before your next appointment, please call your pharmacy*   Lab Work: None If you have labs (blood work) drawn today and your tests are completely normal, you will receive your results only by: [place] (if you have MyChart) OR A paper copy in the mail If you have any lab test that is abnormal or we need to change your treatment, we will call you to review the results.   Follow-Up: At [facility], you and your health needs are our priority.  As part of our continuing mission to provide you with exceptional heart care, we have created designated Provider Care Teams.  These Care Teams include your primary Cardiologist (physician) and Advanced Practice Providers (APPs -  Physician Assistants and Nurse Practitioners) who all work together to provide you with the care you need, when you need it.  We recommend signing up for the patient portal called "MyChart".  Sign up information is provided on this After Visit Summary.  MyChart is used to connect with patients for Virtual Visits (Telemedicine).  Patients are able to view lab/test results, encounter notes, upcoming appointments, etc.  Non-urgent messages can be sent to your provider as well.   To learn more about what you can do with MyChart, go to [email or web address].    Your next appointment:   1st Available   The format for your next appointment:   In Person  Provider:   Candee Furbish, MD {

## 2021-11-22 LAB — BODY FLUID CULTURE W GRAM STAIN: Culture: NO GROWTH

## 2021-11-22 LAB — CYTOLOGY - NON PAP

## 2021-11-23 ENCOUNTER — Ambulatory Visit: Payer: Medicare HMO | Admitting: Vascular Surgery

## 2021-11-23 ENCOUNTER — Encounter: Payer: Self-pay | Admitting: Vascular Surgery

## 2021-11-23 VITALS — BP 138/79 | HR 56 | Temp 98.3°F | Resp 20 | Ht 72.0 in | Wt 181.0 lb

## 2021-11-23 DIAGNOSIS — I890 Lymphedema, not elsewhere classified: Secondary | ICD-10-CM | POA: Diagnosis not present

## 2021-11-23 LAB — CYTOLOGY - NON PAP

## 2021-11-23 NOTE — Progress Notes (Signed)
Office Note     CC: Right lower extremity swelling Requesting Provider:  Renette Butters, MD  HPI: BENJI POYNTER is a 71 y.o. ([birth year]) male presenting at the request of .[name], MD for right lower extremity swelling.  Patient also recently underwent bilateral lower extremity arterial duplex ultrasound demonstrating multiple areas of stenosis.  On exam, Gunner was doing well, accompanied by his wife.  His main concern was the recent bronchial biopsies taken as he has a history of lung cancer. Adon denied symptoms of claudication, ischemic rest pain, tissue loss.  He cut his right great toe while trimming his toenails yesterday evening.  He has had no issues with healing Previously.   Helix has had right lower extremity swelling for the last 3 years.  The size of the leg waxes and wanes over the course of the day, and is worse by days end.  In the morning, legs are symmetric.  He has worn compressions in the past, but does not like them, especially with the current heat.  He appreciates swelling not only in the calf but also on the dorsal aspect of his foot.  He is unaware of a precipitating event 3 years ago.  Denies trauma, denies infection.  Denies varicose veins, telangiectasias  Past Medical History:  Diagnosis Date   Arthritis    Atrial fibrillation ([place]) 10/2018   COPD with emphysema ([place])    Dyspnea    Hypertension    lung ca dx'd 10/2018    Past Surgical History:  Procedure Laterality Date   BRONCHIAL BIOPSY  11/19/2021   Procedure: BRONCHIAL BIOPSIES;  Surgeon: Garner Nash, DO;  Location: [facility];  Service: Pulmonary;;   BRONCHIAL WASHINGS  11/19/2021   Procedure: BRONCHIAL WASHINGS;  Surgeon: Garner Nash, DO;  Location: [facility];  Service: Pulmonary;;   CARDIOVERSION N/A 01/11/2019   Procedure: CARDIOVERSION;  Surgeon: Josue Hector, MD;  Location: [facility];  Service: Cardiovascular;  Laterality: N/A;   CATARACT EXTRACTION W/  INTRAOCULAR LENS IMPLANT Bilateral    KNEE ARTHROSCOPY     BIL    THORACENTESIS  11/19/2021   Procedure: THORACENTESIS;  Surgeon: Garner Nash, DO;  Location: [facility];  Service: Pulmonary;;   TOE SURGERY     PINNED LEFT BIG TOE   TONSILLECTOMY     T+A  AS CHILD   TOTAL KNEE ARTHROPLASTY Left 08/19/2017   Procedure: TOTAL KNEE ARTHROPLASTY;  Surgeon: Renette Butters, MD;  Location: [facility];  Service: Orthopedics;  Laterality: Left;   VIDEO BRONCHOSCOPY Bilateral 11/05/2018   Procedure: VIDEO BRONCHOSCOPY WITH FLUORO;  Surgeon: [name], MD;  Location: [facility];  Service: Cardiopulmonary;  Laterality: Bilateral;   VIDEO BRONCHOSCOPY Bilateral 11/09/2018   Procedure: VIDEO BRONCHOSCOPY WITH FLUORO;  Surgeon: Rigoberto Noel, MD;  Location: [facility];  Service: Cardiopulmonary;  Laterality: Bilateral;   VIDEO BRONCHOSCOPY Right 11/19/2021   Procedure: VIDEO BRONCHOSCOPY WITHOUT FLUORO;  Surgeon: Garner Nash, DO;  Location: [facility];  Service: Pulmonary;  Laterality: Right;    Social History   Socioeconomic History   Marital status: Married    Spouse name: Not on file   Number of children: Not on file   Years of education: Not on file   Highest education level: Not on file  Occupational History   Not on file  Tobacco Use   Smoking status: Former    Packs/day: 1.50    Years: 46.00    Total pack years: 69.00  Types: Cigarettes    Quit date: 09/08/2018    Years since quitting: 3.2   Smokeless tobacco: Never  Vaping Use   Vaping Use: Never used  Substance and Sexual Activity   Alcohol use: Yes    Alcohol/week: 2.0 - 3.0 standard drinks of alcohol    Types: 2 - 3 Shots of liquor per week    Comment: FEW DRINKS AFTER WORK   Drug use: Never   Sexual activity: Not on file  Other Topics Concern   Not on file  Social History Narrative   Not on file   Social Determinants of Health   Financial Resource Strain: Not on file  Food Insecurity: Not on file   Transportation Needs: Not on file  Physical Activity: Not on file  Stress: Not on file  Social Connections: Not on file  Intimate Partner Violence: Not on file   Family History  Problem Relation Age of Onset   Atrial fibrillation Mother    Emphysema Father    Pneumonia Father     Current Outpatient Medications  Medication Sig Dispense Refill   acetaminophen (TYLENOL) 500 MG tablet Take 1,000 mg by mouth every 6 (six) hours as needed for moderate pain.     atorvastatin (LIPITOR) 20 MG tablet Take 20 mg by mouth daily.     diltiazem (CARDIZEM CD) 360 MG 24 hr capsule Take 1 capsule (360 mg total) by mouth daily. 90 capsule 3   ELIQUIS 5 MG TABS tablet TAKE ONE TABLET BY MOUTH TWICE DAILY 180 tablet 1   Fluticasone-Umeclidin-Vilant (TRELEGY ELLIPTA) 100-62.5-25 MCG/ACT AEPB Inhale 1 puff into the lungs daily in the afternoon. 60 each 5   furosemide (LASIX) 20 MG tablet Take 1 tablet (20 mg total) by mouth daily as needed. (Patient taking differently: Take 20 mg by mouth daily.) 90 tablet 3   levothyroxine (SYNTHROID) 88 MCG tablet Take 88 mcg by mouth daily.     metoprolol succinate (TOPROL-XL) 100 MG 24 hr tablet TAKE ONE TABLET BY MOUTH ONCE DAILY WITH OR immediately following A meal (Patient taking differently: Take 100 mg by mouth every evening.) 90 tablet 0   potassium chloride (KLOR-CON M) 10 MEQ tablet Take 1 tablet (10 mEq total) by mouth daily as needed. (Patient taking differently: Take 10 mEq by mouth daily.) 90 tablet 3   traZODone (DESYREL) 50 MG tablet TAKE 1 TABLET BY MOUTH EVERYDAY AT BEDTIME (Patient taking differently: Take 50 mg by mouth at bedtime as needed for sleep.) 90 tablet 1   No current facility-administered medications for this visit.    No Known Allergies   REVIEW OF SYSTEMS:   [X]  denotes positive finding, [ ]  denotes negative finding Cardiac  Comments:  Chest pain or chest pressure:    Shortness of breath upon exertion:    Short of breath when lying  flat:    Irregular heart rhythm:        Vascular    Pain in calf, thigh, or hip brought on by ambulation:    Pain in feet at night that wakes you up from your sleep:     Blood clot in your veins:    Leg swelling:         Pulmonary    Oxygen at home:    Productive cough:     Wheezing:         Neurologic    Sudden weakness in arms or legs:     Sudden numbness in arms or legs:  Sudden onset of difficulty speaking or slurred speech:    Temporary loss of vision in one eye:     Problems with dizziness:         Gastrointestinal    Blood in stool:     Vomited blood:         Genitourinary    Burning when urinating:     Blood in urine:        Psychiatric    Major depression:         Hematologic    Bleeding problems:    Problems with blood clotting too easily:        Skin    Rashes or ulcers:        Constitutional    Fever or chills:      PHYSICAL EXAMINATION:  Vitals:   11/23/21 0915  BP: 138/79  Pulse: (!) 56  Resp: 20  Temp: 98.3 F (36.8 C)  SpO2: 96%  Weight: 181 lb (82.1 kg)  Height: 6' (1.829 m)    General:  WDWN in NAD; vital signs documented above Gait: Not observed HENT: WNL, normocephalic Pulmonary: normal non-labored breathing Cardiac: regular HR,  Abdomen: soft, NT, no masses Skin: without rashes Vascular Exam/Pulses:  Right Left  Radial 2+ (normal) 2+ (normal)  Ulnar 2+ (normal) 2+ (normal)  Femoral 2+ (normal) 2+ (normal)  Popliteal    DP nonpalpable 2+ (normal)  PT nonpalpable absent   Extremities: without ischemic changes, without Gangrene , without cellulitis; with open wounds - right 4th toe from trimming nails Musculoskeletal: no muscle wasting or atrophy  Neurologic: A&O X 3;  No focal weakness or paresthesias are detected Psychiatric:  The pt has Normal affect.   Non-Invasive Vascular Imaging:    Summary:     Abnormal tapering of the abdominal aorta, with largest measurement in the  mid segment at 2.4 cm. No evidence  of stenosis noted in the abdominal  aorta. Normal dimensions noted of the bilateral common and external iliac  arteries.   Right: Atherosclerosis throughout, with extensive, bulky calcifications  and shadowing at the distal CFA and ostium DFA and SFA.  Possible high grade stenosis vs occlusion in the distal CFA, ostial SFA  and ostial DFA.   Left: Atherosclerosis throughout.  >50% stenosis in the mid common iliac artery, low end range.  <50% stenosis in the distal common iliac artery, and mid and distal  external iliac artery.  50-74% stenosis in the distal SFA, low end range.  Occluded distal ATA.        ASSESSMENT/PLAN: MARCELLIUS MONTAGNA is a 71 y.o. male presenting with asymptomatic bilateral lower extremity PAD and chronic right lower extremity swelling.  Regarding the peripheral arterial disease-bilateral duplex ultrasonography was reviewed demonstrating multiple areas of stenosis.  Then it is currently asymptomatic with ambulation limited by shortness of breath.  Being that he has no claudication, rest pain, tissue loss, there is no indication for intervention at this time.  I asked that both he and his wife keep a close eye on the toe that was cut while trimming his nails.  Should this worsen or not heal over the course of the next month, I would like to see him back and we would discuss revascularization options  Regarding the chronic right lower extremity swelling.  It is interesting the swelling has been present for a number of years with no precipitating event.  I question whether he had an undiagnosed DVT he was hospitalized roughly 3 years ago  with A-fib and heart failure.  The distribution of swelling is consistent with lymphedema, as it is present on the dorsal aspect of the foot.  Fortunately, there is not a fibrotic component as the calf remains soft with the edema still pitting.  I had a long conversation with Grigor and his wife regarding the above.  We do not need to  intervene on the peripheral arterial disease unless lifestyle limiting claudication, rest pain, or tissue loss occur.  I would like to obtain a lower extremity ABI to quantify perfusion distally as well as right lower extremity venous duplex ultrasound to assess for venous insufficiency.  He is aware he is best treated with daily knee-high compression stockings 20 to 30 mmHg, and elevation when able.  I plan to see him in 1 month to discuss the results of the studies.  During his visit, he and his wife asked about their pathology report.  I shared with them that while all pathology samples were not finalized, there was a finalized sample demonstrating squamous cell carcinoma.  They plan to get in touch with their oncologist for further treatment.    We discussed smoking cessation, and its importance related to both cancer and peripheral arterial disease.   Broadus John, MD [facility] [phone]

## 2021-11-26 ENCOUNTER — Telehealth: Payer: Self-pay | Admitting: Medical Oncology

## 2021-11-26 NOTE — Telephone Encounter (Signed)
Requests appt. Recent path from lung lavage,  " Malignant cells consistent with squamous cell carcinoma ". Results given to pt by vascular surgeon  . They have not heard from Dr. Valeta Harms- "he is probably waiting on the other specimens".  Please advise.

## 2021-11-27 ENCOUNTER — Other Ambulatory Visit: Payer: Self-pay

## 2021-11-27 ENCOUNTER — Ambulatory Visit: Payer: Medicare HMO | Admitting: Cardiology

## 2021-11-27 ENCOUNTER — Telehealth: Payer: Self-pay | Admitting: Internal Medicine

## 2021-11-27 DIAGNOSIS — I890 Lymphedema, not elsewhere classified: Secondary | ICD-10-CM

## 2021-11-27 DIAGNOSIS — I739 Peripheral vascular disease, unspecified: Secondary | ICD-10-CM

## 2021-11-27 NOTE — Telephone Encounter (Signed)
Scheduled per 8/1 in basket, pt wife confirmed appt

## 2021-11-29 ENCOUNTER — Other Ambulatory Visit: Payer: Self-pay | Admitting: *Deleted

## 2021-11-29 ENCOUNTER — Inpatient Hospital Stay: Payer: Medicare HMO | Attending: Internal Medicine | Admitting: Internal Medicine

## 2021-11-29 ENCOUNTER — Other Ambulatory Visit: Payer: Self-pay

## 2021-11-29 VITALS — BP 129/92 | HR 85 | Temp 98.4°F | Resp 15 | Wt 184.1 lb

## 2021-11-29 DIAGNOSIS — Z5189 Encounter for other specified aftercare: Secondary | ICD-10-CM | POA: Insufficient documentation

## 2021-11-29 DIAGNOSIS — C342 Malignant neoplasm of middle lobe, bronchus or lung: Secondary | ICD-10-CM | POA: Insufficient documentation

## 2021-11-29 DIAGNOSIS — Z79899 Other long term (current) drug therapy: Secondary | ICD-10-CM | POA: Diagnosis not present

## 2021-11-29 DIAGNOSIS — Z7901 Long term (current) use of anticoagulants: Secondary | ICD-10-CM | POA: Diagnosis not present

## 2021-11-29 DIAGNOSIS — Z5111 Encounter for antineoplastic chemotherapy: Secondary | ICD-10-CM | POA: Insufficient documentation

## 2021-11-29 DIAGNOSIS — C3431 Malignant neoplasm of lower lobe, right bronchus or lung: Secondary | ICD-10-CM | POA: Insufficient documentation

## 2021-11-29 DIAGNOSIS — C3491 Malignant neoplasm of unspecified part of right bronchus or lung: Secondary | ICD-10-CM | POA: Diagnosis not present

## 2021-11-29 DIAGNOSIS — I4891 Unspecified atrial fibrillation: Secondary | ICD-10-CM | POA: Diagnosis not present

## 2021-11-29 DIAGNOSIS — Z5112 Encounter for antineoplastic immunotherapy: Secondary | ICD-10-CM | POA: Diagnosis not present

## 2021-11-29 NOTE — Progress Notes (Signed)
[facility] [phone]   [phone]  OFFICE PROGRESS NOTE  [name], MD [place] [address]  DIAGNOSIS: Recurrent non-small cell lung cancer, squamous cell carcinoma of the right lung initially diagnosed as stage IIIA (T3, N1, M0) non-small cell lung cancer, squamous cell carcinoma diagnosed in July 2020 and presented with right middle lobe mass and right hilar adenopathy with postobstructive pneumonia and suspicious right pleural effusion.   PRIOR THERAPY:   1) Weekly concurrent chemoradiation with Carboplatin for an AUC of 2 and paclitaxel 45 mg/m2. First dose 11/23/2018. Status post 7 cycles.  2) Consolidation immunotherapy with Imfinzi 10 mg/KG every 2 weeks.  First dose February 08, 2019.  Status post 26 cycles.  CURRENT THERAPY: Systemic chemotherapy with carboplatin for AUC of 5, paclitaxel 175 Mg/M2 and Libtayo (Cempilimab) 350 Mg IV every 3 weeks with Neulasta support.  First dose 12/11/2021.  INTERVAL HISTORY: Joe Cole 71 y.o. male returns to the clinic today for follow-up visit accompanied by his wife.  The patient is feeling fine today with no concerning complaints except for the baseline shortness of breath and cough.  He has no chest pain or hemoptysis.  He denied having any recent weight loss or night sweats.  He has no nausea, vomiting, diarrhea or constipation.  He has no headache or visual changes.  He was found on recent imaging studies to have increased consolidation in the right hilar area that was suspicious for disease recurrence.  The patient was seen by Dr. Valeta Harms from pulmonary medicine and he underwent repeat bronchoscopy and unfortunately the final pathology showed evidence for recurrent squamous cell carcinoma.  He is here today for evaluation and discussion of his treatment options.  MEDICAL HISTORY: Past Medical History:  Diagnosis Date   Arthritis    Atrial fibrillation ([place])  10/2018   COPD with emphysema ([place])    Dyspnea    Hypertension    lung ca dx'd 10/2018    ALLERGIES:  has No Known Allergies.  MEDICATIONS:  Current Outpatient Medications  Medication Sig Dispense Refill   acetaminophen (TYLENOL) 500 MG tablet Take 1,000 mg by mouth every 6 (six) hours as needed for moderate pain.     atorvastatin (LIPITOR) 20 MG tablet Take 20 mg by mouth daily.     diltiazem (CARDIZEM CD) 360 MG 24 hr capsule Take 1 capsule (360 mg total) by mouth daily. 90 capsule 3   ELIQUIS 5 MG TABS tablet TAKE ONE TABLET BY MOUTH TWICE DAILY 180 tablet 1   Fluticasone-Umeclidin-Vilant (TRELEGY ELLIPTA) 100-62.5-25 MCG/ACT AEPB Inhale 1 puff into the lungs daily in the afternoon. 60 each 5   furosemide (LASIX) 20 MG tablet Take 1 tablet (20 mg total) by mouth daily as needed. (Patient taking differently: Take 20 mg by mouth daily.) 90 tablet 3   levothyroxine (SYNTHROID) 88 MCG tablet Take 88 mcg by mouth daily.     metoprolol succinate (TOPROL-XL) 100 MG 24 hr tablet TAKE ONE TABLET BY MOUTH ONCE DAILY WITH OR immediately following A meal (Patient taking differently: Take 100 mg by mouth every evening.) 90 tablet 0   potassium chloride (KLOR-CON M) 10 MEQ tablet Take 1 tablet (10 mEq total) by mouth daily as needed. (Patient taking differently: Take 10 mEq by mouth daily.) 90 tablet 3   traZODone (DESYREL) 50 MG tablet TAKE 1 TABLET BY MOUTH EVERYDAY AT BEDTIME (Patient taking differently: Take 50 mg by mouth at  bedtime as needed for sleep.) 90 tablet 1   No current facility-administered medications for this visit.    SURGICAL HISTORY:  Past Surgical History:  Procedure Laterality Date   BRONCHIAL BIOPSY  11/19/2021   Procedure: BRONCHIAL BIOPSIES;  Surgeon: Garner Nash, DO;  Location: [facility];  Service: Pulmonary;;   BRONCHIAL WASHINGS  11/19/2021   Procedure: BRONCHIAL WASHINGS;  Surgeon: Garner Nash, DO;  Location: [facility];  Service: Pulmonary;;    CARDIOVERSION N/A 01/11/2019   Procedure: CARDIOVERSION;  Surgeon: Josue Hector, MD;  Location: [facility];  Service: Cardiovascular;  Laterality: N/A;   CATARACT EXTRACTION W/ INTRAOCULAR LENS IMPLANT Bilateral    KNEE ARTHROSCOPY     BIL    THORACENTESIS  11/19/2021   Procedure: THORACENTESIS;  Surgeon: Garner Nash, DO;  Location: [facility];  Service: Pulmonary;;   TOE SURGERY     PINNED LEFT BIG TOE   TONSILLECTOMY     T+A  AS CHILD   TOTAL KNEE ARTHROPLASTY Left 08/19/2017   Procedure: TOTAL KNEE ARTHROPLASTY;  Surgeon: Renette Butters, MD;  Location: [facility];  Service: Orthopedics;  Laterality: Left;   VIDEO BRONCHOSCOPY Bilateral 11/05/2018   Procedure: VIDEO BRONCHOSCOPY WITH FLUORO;  Surgeon: [name], MD;  Location: [facility];  Service: Cardiopulmonary;  Laterality: Bilateral;   VIDEO BRONCHOSCOPY Bilateral 11/09/2018   Procedure: VIDEO BRONCHOSCOPY WITH FLUORO;  Surgeon: Rigoberto Noel, MD;  Location: [facility];  Service: Cardiopulmonary;  Laterality: Bilateral;   VIDEO BRONCHOSCOPY Right 11/19/2021   Procedure: VIDEO BRONCHOSCOPY WITHOUT FLUORO;  Surgeon: Garner Nash, DO;  Location: [facility];  Service: Pulmonary;  Laterality: Right;    REVIEW OF SYSTEMS:  Constitutional: positive for fatigue Eyes: negative Ears, nose, mouth, throat, and face: negative Respiratory: positive for cough and dyspnea on exertion Cardiovascular: negative Gastrointestinal: negative Genitourinary:negative Integument/breast: negative Hematologic/lymphatic: negative Musculoskeletal:negative Neurological: negative Behavioral/Psych: negative Endocrine: negative Allergic/Immunologic: negative   PHYSICAL EXAMINATION: General appearance: alert, cooperative, fatigued, and no distress Head: Normocephalic, without obvious abnormality, atraumatic Neck: no adenopathy, no JVD, supple, symmetrical, trachea midline, and thyroid not enlarged, symmetric, no  tenderness/mass/nodules Lymph nodes: Cervical, supraclavicular, and axillary nodes normal. Resp: diminished breath sounds RLL and dullness to percussion RLL Back: symmetric, no curvature. ROM normal. No CVA tenderness. Cardio: regular rate and rhythm, S1, S2 normal, no murmur, click, rub or gallop GI: soft, non-tender; bowel sounds normal; no masses,  no organomegaly Extremities: edema 1+ edema right lower extremity Neurologic: Alert and oriented X 3, normal strength and tone. Normal symmetric reflexes. Normal coordination and gait  ECOG PERFORMANCE STATUS: 1 - Symptomatic but completely ambulatory  Blood pressure (!) 129/92, pulse 85, temperature 98.4 F (36.9 C), temperature source Oral, resp. rate 15, weight 184 lb 1.6 oz (83.5 kg), SpO2 99 %.  LABORATORY DATA: Lab Results  Component Value Date   WBC 6.1 11/06/2021   HGB 13.2 11/06/2021   HCT 36.0 (L) 11/06/2021   MCV 118.4 (H) 11/06/2021   PLT 117 (L) 11/06/2021      Chemistry      Component Value Date/Time   NA 131 (L) 11/20/2021 0733   K 5.1 11/20/2021 0733   CL 94 (L) 11/20/2021 0733   CO2 22 11/20/2021 0733   BUN 14 11/20/2021 0733   CREATININE 1.04 11/20/2021 0733   CREATININE 0.79 11/06/2021 1153      Component Value Date/Time   CALCIUM 8.8 11/20/2021 0733   ALKPHOS 77 11/06/2021 1153   AST 41 11/06/2021 1153  AST 42 (H) 11/06/2021 0840   ALT 42 11/06/2021 1153   ALT 42 11/06/2021 0840   BILITOT 1.2 11/06/2021 1153   BILITOT 0.8 11/06/2021 0840       RADIOGRAPHIC STUDIES: VAS [place] LOWER EXTREMITY ARTERIAL DUPLEX  Result Date: 11/22/2021 LOWER EXTREMITY ARTERIAL DUPLEX STUDY Patient Name:  RANELL SKIBINSKI  Date of Exam:   11/20/2021 Medical Rec #: [id number]         Accession #:    [id number] Date of Birth: 1950-07-02         Patient Gender: M Patient Age:   24 years Exam Location:  [facility] Procedure:      VAS [place] LOWER EXTREMITY ARTERIAL DUPLEX Referring Phys: Ermalinda Barrios  --------------------------------------------------------------------------------  Indications: Peripheral artery disease, and patient reports worsening right              lower extremity swelling for over one year. He denies any              claudication symptoms or rest pain. High Risk Factors: Hypertension, hyperlipidemia, past history of smoking. Other Factors: COPD.  Current ABI: .68 on the right and 1.02 on the left Comparison Study: NA Performing Technologist: Sharlett Iles RVT  Examination Guidelines: A complete evaluation includes B-mode imaging, spectral Doppler, color Doppler, and power Doppler as needed of all accessible portions of each vessel. Bilateral testing is considered an integral part of a complete examination. Limited examinations for reoccurring indications may be performed as noted. Aorta: +--------+-------+----------+----------+-----------+--------+-----+         AP (cm)Trans (cm)PSV (cm/s)Waveform   ThrombusShape +--------+-------+----------+----------+-----------+--------+-----+ Proximal2.10   2.10      85        multiphasic              +--------+-------+----------+----------+-----------+--------+-----+ Mid     2.40   2.40      48        monophasic               +--------+-------+----------+----------+-----------+--------+-----+ Distal  1.80   1.80      68        biphasic                 +--------+-------+----------+----------+-----------+--------+-----+ Abnormal tapering of the abdominal aorta; no evidence of stenosis noted.  +-----------+--------+-----+--------+-----------+------------------------------+ RIGHT      PSV cm/sRatioStenosisWaveform   Comments                       +-----------+--------+-----+--------+-----------+------------------------------+ CIA Prox   94                   biphasic   1.3 cm AP x 1.3 cm TRV         +-----------+--------+-----+--------+-----------+------------------------------+ CIA Mid    122                   multiphasic                               +-----------+--------+-----+--------+-----------+------------------------------+ CIA Distal 134                  monophasic                                +-----------+--------+-----+--------+-----------+------------------------------+ EIA Prox   97  monophasic 1.2 cm AP x 1.2 cm TRV         +-----------+--------+-----+--------+-----------+------------------------------+ EIA Mid    73                   monophasic 1.3 cm AP x 1.3 cm TRV         +-----------+--------+-----+--------+-----------+------------------------------+ EIA Distal 106                  monophasic                                +-----------+--------+-----+--------+-----------+------------------------------+ CFA Prox   52                   monophasic                                +-----------+--------+-----+--------+-----------+------------------------------+ CFA Mid    55                   monophasic                                +-----------+--------+-----+--------+-----------+------------------------------+ CFA Distal 88                   monophasic extensive bulky                                                           calcifications, cannot exclude                                            high grade stenosis vs                                                    occlusion                      +-----------+--------+-----+--------+-----------+------------------------------+ DFA        208                  monophasic extensive bulky                                                           calcifications, cannot exclude                                            high grade stenosis vs  occlusion at ostium            +-----------+--------+-----+--------+-----------+------------------------------+ SFA Prox   67                    monophasic extensive bulky                                                           calcifications, cannot exclude                                            high grade stenosis vs                                                    occlusion at ostium            +-----------+--------+-----+--------+-----------+------------------------------+ SFA Mid    43                   monophasic                                +-----------+--------+-----+--------+-----------+------------------------------+ SFA Distal 39                   monophasic                                +-----------+--------+-----+--------+-----------+------------------------------+ POP Prox   14                   monophasic                                +-----------+--------+-----+--------+-----------+------------------------------+ POP Mid    14                   monophasic                                +-----------+--------+-----+--------+-----------+------------------------------+ POP Distal 15                   monophasic                                +-----------+--------+-----+--------+-----------+------------------------------+ TP Trunk   29                   monophasic                                +-----------+--------+-----+--------+-----------+------------------------------+ ATA Distal 22                   monophasic                                +-----------+--------+-----+--------+-----------+------------------------------+  PTA Distal 35                   monophasic                                +-----------+--------+-----+--------+-----------+------------------------------+ PERO Distal22                   monophasic                                +-----------+--------+-----+--------+-----------+------------------------------+  +-----------+--------+-----+---------------+-----------+-----------------------+ LEFT       PSV cm/sRatioStenosis        Waveform   Comments                +-----------+--------+-----+---------------+-----------+-----------------------+ CIA Prox   113                         biphasic   1.3 cm AP x 1.3 cm TRV  +-----------+--------+-----+---------------+-----------+-----------------------+ CIA Mid    243          50-74% stenosismultiphasic>50% stenosis, low end                                                    range                   +-----------+--------+-----+---------------+-----------+-----------------------+ CIA Distal 170          30-49% stenosisbiphasic   <50% stenosis           +-----------+--------+-----+---------------+-----------+-----------------------+ EIA Prox   116                         biphasic   1.0 cm AP x 1.0 cm TRV  +-----------+--------+-----+---------------+-----------+-----------------------+ EIA Mid    152          30-49% stenosismultiphasic<50% stenosis           +-----------+--------+-----+---------------+-----------+-----------------------+ EIA Distal 174          30-49% stenosisbiphasic   <50% stenosis, 1.0 cm                                                     AP x 1.0 cm TRV         +-----------+--------+-----+---------------+-----------+-----------------------+ CFA Prox   138                         multiphasic                        +-----------+--------+-----+---------------+-----------+-----------------------+ DFA        128                         triphasic                          +-----------+--------+-----+---------------+-----------+-----------------------+ SFA Prox   70  multiphasic                        +-----------+--------+-----+---------------+-----------+-----------------------+ SFA Mid    109                         triphasic                          +-----------+--------+-----+---------------+-----------+-----------------------+ SFA Distal 212     2.2  50-74%  stenosistriphasic  mid/distal SFA, low end                                                   range                   +-----------+--------+-----+---------------+-----------+-----------------------+ POP Prox   106                         biphasic                           +-----------+--------+-----+---------------+-----------+-----------------------+ POP Mid    41                          biphasic                           +-----------+--------+-----+---------------+-----------+-----------------------+ POP Distal 44                          biphasic                           +-----------+--------+-----+---------------+-----------+-----------------------+ TP Trunk   66                          multiphasic                        +-----------+--------+-----+---------------+-----------+-----------------------+ ATA Distal 0            occluded                                          +-----------+--------+-----+---------------+-----------+-----------------------+ PTA Distal 53                          triphasic                          +-----------+--------+-----+---------------+-----------+-----------------------+ PERO Distal56                          biphasic                           +-----------+--------+-----+---------------+-----------+-----------------------+ A focal velocity elevation of 212 cm/s was obtained at mid/distal SFA with a VR of 2.2.  Summary:  Abnormal tapering of the abdominal aorta, with largest measurement in the mid segment at 2.4 cm. No  evidence of stenosis noted in the abdominal aorta. Normal dimensions noted of the bilateral common and external iliac arteries. Right: Atherosclerosis throughout, with extensive, bulky calcifications and shadowing at the distal CFA and ostium DFA and SFA. Possible high grade stenosis vs occlusion in the distal CFA, ostial SFA and ostial DFA. Left: Atherosclerosis throughout. >50% stenosis in the mid  common iliac artery, low end range. <50% stenosis in the distal common iliac artery, and mid and distal external iliac artery. 50-74% stenosis in the distal SFA, low end range. Occluded distal ATA.   See table(s) above for measurements and observations. See ABI report. Vascular consult recommended. Electronically signed by Jenkins Rouge MD on 11/22/2021 at 9:51:48 AM.    Final    VAS [place] LE ART SEG MULTI (Segm&LE Reynauds)  Result Date: 11/22/2021  LOWER EXTREMITY DOPPLER STUDY Patient Name:  Joe Cole  Date of Exam:   11/20/2021 Medical Rec #: [id number]         Accession #:    [id number] Date of Birth: [birth year]         Patient Gender: M Patient Age:   71 years Exam Location:  [facility] Procedure:      VAS [place] LOWER EXT ART SEG MULTI (SEGMENTALS & LE RAYNAUDS) Referring Phys: Ermalinda Barrios --------------------------------------------------------------------------------  Indications: Peripheral artery disease, and patient reports worsening right              lower extremity swelling for over one year. He denies any              claudication symptoms or rest pain. High Risk Factors: Hypertension, hyperlipidemia, past history of smoking. Other Factors: COPD.  Performing Technologist: Sharlett Iles RVT  Examination Guidelines: A complete evaluation includes at minimum, Doppler waveform signals and systolic blood pressure reading at the level of bilateral brachial, anterior tibial, and posterior tibial arteries, when vessel segments are accessible. Bilateral testing is considered an integral part of a complete examination. Photoelectric Plethysmograph (PPG) waveforms and toe systolic pressure readings are included as required and additional duplex testing as needed. Limited examinations for reoccurring indications may be performed as noted.  ABI Findings: +---------+------------------+-----+-------------------+--------+ Right    Rt Pressure (mmHg)IndexWaveform           Comment   +---------+------------------+-----+-------------------+--------+ Brachial 117                                                +---------+------------------+-----+-------------------+--------+ CFA                             monophasic                  +---------+------------------+-----+-------------------+--------+ Popliteal                       monophasic                  +---------+------------------+-----+-------------------+--------+ PTA      83                0.68 dampened monophasic         +---------+------------------+-----+-------------------+--------+ PERO     68                0.56 dampened monophasic         +---------+------------------+-----+-------------------+--------+ DP  80                0.66 dampened monophasic         +---------+------------------+-----+-------------------+--------+ Great Toe49                0.40 Abnormal                    +---------+------------------+-----+-------------------+--------+ +---------+------------------+-----+----------+-------+ Left     Lt Pressure (mmHg)IndexWaveform  Comment +---------+------------------+-----+----------+-------+ Brachial 122                                      +---------+------------------+-----+----------+-------+ CFA                             triphasic         +---------+------------------+-----+----------+-------+ Popliteal                       biphasic          +---------+------------------+-----+----------+-------+ PTA      125               1.02 biphasic          +---------+------------------+-----+----------+-------+ PERO     108               0.92 biphasic          +---------+------------------+-----+----------+-------+ DP       113               0.93 monophasicbrisk   +---------+------------------+-----+----------+-------+ Great Toe50                0.41 Abnormal          +---------+------------------+-----+----------+-------+  +-------+-----------+-----------+------------+------------+ ABI/TBIToday's ABIToday's TBIPrevious ABIPrevious TBI +-------+-----------+-----------+------------+------------+ Right  .68        .40                                 +-------+-----------+-----------+------------+------------+ Left   1.02       .41                                 +-------+-----------+-----------+------------+------------+   Summary: Right: Resting right ankle-brachial index indicates moderate right lower extremity arterial disease. The right toe-brachial index is abnormal. Left: Resting left ankle-brachial index is within normal range. No evidence of significant left lower extremity arterial disease. The left toe-brachial index is abnormal. *See table(s) above for measurements and observations. See LE Arterial duplex report. Vascular consult recommended. Electronically signed by Jenkins Rouge MD on 11/22/2021 at 9:40:56 AM.    Final    ECHOCARDIOGRAM COMPLETE  Result Date: 11/20/2021    ECHOCARDIOGRAM REPORT   Patient Name:   Joe Cole Date of Exam: 11/20/2021 Medical Rec #:  [id number]        Height:       72.0 in Accession #:    [id number]       Weight:       187.0 lb Date of Birth:  07-Dec-1950        BSA:          2.071 m Patient Age:    61 years         BP:  119/73 mmHg Patient Gender: M                HR:           77 bpm. Exam Location:  [address] Procedure: 2D Echo, Color Doppler, Cardiac Doppler and Strain Analysis Indications:    R06.00 Dyspnea  History:        Patient has prior history of Echocardiogram examinations, most                 recent 11/06/2018. COPD and Lung cancer, Arrythmias:Atrial                 Fibrillation; Risk Factors:Hypertension and HLD.  Sonographer:    Marygrace Drought RCS Referring Phys: [place]  1. Left ventricular ejection fraction, by estimation, is 50%. The left ventricle has mildly decreased function. The left ventricle demonstrates global  hypokinesis. There is mild asymmetric left ventricular hypertrophy of the basal-septal segment. Left ventricular diastolic parameters are indeterminate. The average left ventricular global longitudinal strain is -14.8 %. The global longitudinal strain is abnormal.  2. Right ventricular systolic function is normal. The right ventricular size is mildly enlarged. There is normal pulmonary artery systolic pressure. The estimated right ventricular systolic pressure is 13.2 mmHg.  3. The mitral valve is abnormal. No evidence of mitral valve regurgitation.  4. Aortic dilatation noted. There is mild dilatation of the ascending aorta, measuring 43 mm, though measured off axis.  5. The aortic valve is tricuspid. There is mild calcification of the aortic valve. There is mild thickening of the aortic valve. Aortic valve regurgitation is mild. Aortic valve sclerosis is present, with no evidence of aortic valve stenosis.  6. Left atrial size was moderately dilated.  7. Right atrial size was moderately dilated.  8. The inferior vena cava is normal in size with greater than 50% respiratory variability, suggesting right atrial pressure of 3 mmHg. FINDINGS  Left Ventricle: Left ventricular ejection fraction, by estimation, is 50%. The left ventricle has mildly decreased function. The left ventricle demonstrates global hypokinesis. The average left ventricular global longitudinal strain is -14.8 %. The global longitudinal strain is abnormal. The left ventricular internal cavity size was normal in size. There is mild asymmetric left ventricular hypertrophy of the basal-septal segment. Left ventricular diastolic parameters are indeterminate. Right Ventricle: The right ventricular size is mildly enlarged. No increase in right ventricular wall thickness. Right ventricular systolic function is normal. There is normal pulmonary artery systolic pressure. The tricuspid regurgitant velocity is 2.61  m/s, and with an assumed right atrial  pressure of 3 mmHg, the estimated right ventricular systolic pressure is 44.0 mmHg. Left Atrium: Left atrial size was moderately dilated. Right Atrium: Right atrial size was moderately dilated. Pericardium: There is no evidence of pericardial effusion. Mitral Valve: The mitral valve is abnormal. No evidence of mitral valve regurgitation. Tricuspid Valve: The tricuspid valve is normal in structure. Tricuspid valve regurgitation is mild. Aortic Valve: The aortic valve is tricuspid. There is mild calcification of the aortic valve. There is mild thickening of the aortic valve. There is mild aortic valve annular calcification. Aortic valve regurgitation is mild. Aortic regurgitation PHT measures 706 msec. Aortic valve sclerosis is present, with no evidence of aortic valve stenosis. Pulmonic Valve: The pulmonic valve was normal in structure. Pulmonic valve regurgitation is not visualized. No evidence of pulmonic stenosis. Aorta: Aortic dilatation noted. There is mild dilatation of the ascending aorta, measuring 43 mm. Venous: The inferior vena cava is  normal in size with greater than 50% respiratory variability, suggesting right atrial pressure of 3 mmHg. IAS/Shunts: No atrial level shunt detected by color flow Doppler.  LEFT VENTRICLE PLAX 2D LVIDd:         4.80 cm   Diastology LVIDs:         3.40 cm   LV e' medial:    8.49 cm/s LV PW:         1.00 cm   LV E/e' medial:  17.5 LV IVS:        1.20 cm   LV e' lateral:   13.10 cm/s LVOT diam:     2.00 cm   LV E/e' lateral: 11.3 LV SV:         45 LV SV Index:   22        2D Longitudinal Strain LVOT Area:     3.14 cm  2D Strain GLS (A2C):   -15.2 %                          2D Strain GLS (A3C):   -12.7 %                          2D Strain GLS (A4C):   -16.4 %                          2D Strain GLS Avg:     -14.8 % RIGHT VENTRICLE RV Basal diam:  4.10 cm RV Mid diam:    3.00 cm RV S prime:     11.90 cm/s TAPSE (M-mode): 1.8 cm RVSP:           30.2 mmHg LEFT ATRIUM              Index        RIGHT ATRIUM           Index LA diam:        5.00 cm 2.41 cm/m   RA Pressure: 3.00 mmHg LA Vol (A2C):   64.8 ml 31.30 ml/m  RA Area:     17.60 cm LA Vol (A4C):   45.9 ml 22.17 ml/m  RA Volume:   44.80 ml  21.64 ml/m LA Biplane Vol: 55.3 ml 26.71 ml/m  AORTIC VALVE LVOT Vmax:   67.90 cm/s LVOT Vmean:  49.350 cm/s LVOT VTI:    0.143 m AI PHT:      706 msec  AORTA Ao Root diam: 3.80 cm Ao Asc diam:  4.40 cm MITRAL VALVE                TRICUSPID VALVE MV Area (PHT):              TR Peak grad:   27.2 mmHg MV Decel Time:              TR Vmax:        261.00 cm/s MV E velocity: 148.50 cm/s  Estimated RAP:  3.00 mmHg                             RVSP:           30.2 mmHg                              SHUNTS  Systemic VTI:  0.14 m                             Systemic Diam: 2.00 cm Rudean Haskell MD Electronically signed by Rudean Haskell MD Signature Date/Time: 11/20/2021/10:15:14 AM    Final    DG CHEST PORT 1 VIEW  Result Date: 11/19/2021 CLINICAL DATA:  Post bronchoscopy EXAM: PORTABLE CHEST 1 VIEW COMPARISON:  06/06/2021, CT 11/06/2021 FINDINGS: Slightly improved aeration of right base. Mild irregular right infrahilar opacity as before. Normal cardiac size. Aortic atherosclerosis. No pneumothorax is seen. IMPRESSION: 1. Interval slightly improved aeration at the right middle lobe and right base. Some residual right infrahilar opacity 2. Negative for pneumothorax Electronically Signed   By: Donavan Foil M.D.   On: 11/19/2021 16:30   CT Chest W Contrast  Result Date: 11/07/2021 CLINICAL DATA:  Primary Cancer Type: Lung Imaging Indication: Routine surveillance Interval therapy since last imaging? No Initial Cancer Diagnosis Date: 11/09/2018; Established by: Biopsy-proven Detailed Pathology: Stage IIIA non-small cell lung cancer, squamous cell carcinoma. Primary Tumor location:  Right middle lobe. Surgeries: No. Chemotherapy: Yes; Ongoing?  No; Most recent  administration: 12/2018 Immunotherapy?  Yes; Type: Imfinzi; Ongoing? No Radiation therapy? Yes; Date Range: 11/19/2018 - 01/05/2019; Target: Right lung, perihilar * Tracking Code: BO * EXAM: CT CHEST WITH CONTRAST TECHNIQUE: Multidetector CT imaging of the chest was performed during intravenous contrast administration. RADIATION DOSE REDUCTION: This exam was performed according to the departmental dose-optimization program which includes automated exposure control, adjustment of the mA and/or kV according to patient size and/or use of iterative reconstruction technique. CONTRAST:  17mL OMNIPAQUE IOHEXOL 300 MG/ML  SOLN COMPARISON:  Most recent CT chst 04/12/2021.  05/15/2021 PET-CT. FINDINGS: Cardiovascular: The heart is normal in size. No pericardial effusion. The aorta is normal in caliber. No dissection. Stable atherosclerotic calcifications. Stable branch vessel calcifications including the coronary arteries. The pulmonary arteries appear normal. Mediastinum/Nodes: No mediastinal or hilar mass or adenopathy. Small scattered lymph nodes are stable. Lungs/Pleura: Progressive right middle lobe atelectasis and new right lower lobe atelectasis. Near complete occlusion of the bronchus intermedius with only a small amount residual aerated right lower lobe. Persistent large right pleural effusion may be contributory. No pulmonary lesions or pulmonary nodules. The left lung remains clear. Upper Abdomen: Stable band of low attenuation in the liver, likely fatty change. No worrisome hepatic lesions. Stable small right adrenal gland adenoma. Stable advanced aortic calcifications. Musculoskeletal: No significant bony findings. IMPRESSION: 1. Progressive right middle lobe and right lower lobe atelectasis. Near complete occlusion of the bronchus intermedius and only a small amount of residual aerated right middle lobe and right lower lobe. Bronchoscopy may be helpful for further evaluation. 2. Persistent large right pleural  effusion. 3. No pulmonary lesions or pulmonary nodules. No mediastinal or hilar mass or adenopathy. 4. Stable advanced atherosclerotic calcifications involving the major arterial structures. 5. Stable right adrenal gland adenoma. Aortic Atherosclerosis (ICD10-I70.0). Electronically Signed   By: Marijo Sanes M.D.   On: 11/07/2021 11:22    ASSESSMENT AND PLAN: This is a very pleasant 71 years old white male with stage IIIA non-small cell lung cancer, squamous cell carcinoma.  The patient underwent a course of concurrent chemoradiation with weekly carboplatin and paclitaxel status post 7 cycles.  The patient tolerated his treatment well except for mild fatigue and odynophagia. The patient completed a course of consolidation treatment with immunotherapy with Imfinzi every 2 weeks status post 26 cycles.  He tolerated his treatment well with no concerning adverse effects. The patient has been on observation since that time.  He had a PET scan in January 2023 that showed no hypermetabolic activity in the central right lung/right hilum to suggest local disease recurrence.  He has no other evidence of metastatic disease.  He also underwent ultrasound-guided right thoracentesis on May 28 2021 with drainage of 1.2 L of clear yellow fluid.  The cytology showed reactive mesothelial cells. The patient had repeat CT scan of the chest performed recently.  I personally and independently reviewed the scan images and discussed the results and showed the images to the patient and his wife.  His scan showed progressive right middle lobe and right lower lobe atelectasis with near complete occlusion of the bronchus intermedius and only a small amount of residual aerated right middle lobe and right lower lobe. The patient underwent repeat bronchoscopy and the final pathology was consistent with recurrent squamous cell carcinoma of the right lung.  Ultrasound-guided right thoracentesis showed no evidence of malignant cells and  the pleural fluid. I had a lengthy discussion with the patient and his wife about his current condition and treatment options. His case was also discussed at the weekly thoracic conference earlier today and the option was consideration of repeat radiotherapy to this area with maximum of 30 Gray versus proceeding with systemic chemotherapy and immunotherapy and holding the radiation for salvage treatment in the future.  I discussed the options with the patient and he would like to proceed with the systemic chemotherapy and immunotherapy. He would be treated with carboplatin for AUC of 5, paclitaxel 175 Mg/M2 and Libtayo (Cempilimab) 350 Mg IV every 3 weeks with Neulasta support. I discussed with the patient the adverse effect of this treatment including but not limited to alopecia, myelosuppression, nausea and vomiting, peripheral neuropathy, liver or renal dysfunction as well as immunotherapy adverse effects. He is expected to start the first cycle of this treatment on 12/11/2021. I will see him back for follow-up visit in 4 weeks with the start of cycle #2. For the Neulasta pain, he will use Tylenol and Claritin on as-needed basis if he develop any significant pain from the Neulasta injection. The patient was advised to call immediately if he has any other concerning symptoms in the interval. For the history of atrial fibrillation, he will continue his current treatment with Eliquis.  The patient voices understanding of current disease status and treatment options and is in agreement with the current care plan. All questions were answered. The patient knows to call the clinic with any problems, questions or concerns. We can certainly see the patient much sooner if necessary. The total time spent in the appointment was 30 minutes.  Disclaimer: This note was dictated with voice recognition software. Similar sounding words can inadvertently be transcribed and may not be corrected upon review.

## 2021-11-29 NOTE — Progress Notes (Signed)
DISCONTINUE ON PATHWAY REGIMEN - Non-Small Cell Lung     A cycle is every 14 days:     Durvalumab   **Always confirm dose/schedule in your pharmacy ordering system**  REASON: Disease Progression PRIOR TREATMENT: [id number]: Durvalumab 10 mg/kg q14 Days x up to 12 Months TREATMENT RESPONSE: Stable Disease (SD)  START OFF PATHWAY REGIMEN - Non-Small Cell Lung   [id number]:Cemiplimab 350 mg IV D1 + Carboplatin AUC=6 IV D1 + Paclitaxel 200 mg/m2 IV D1 q21 Days x 4 Cycles:   A cycle is every 21 days:     Paclitaxel      Carboplatin      Cemiplimab-rwlc   **Always confirm dose/schedule in your pharmacy ordering system**  Patient Characteristics: Local Recurrence Therapeutic Status: Local Recurrence Intent of Therapy: Non-Curative / Palliative Intent, Discussed with Patient

## 2021-11-29 NOTE — Progress Notes (Signed)
The proposed treatment discussed in conference is for discussion purpose only and is not a binding recommendation.  The patients have not been physically examined, or presented with their treatment options.  Therefore, final treatment plans cannot be decided.  

## 2021-11-30 ENCOUNTER — Telehealth: Payer: Self-pay | Admitting: Internal Medicine

## 2021-11-30 ENCOUNTER — Other Ambulatory Visit: Payer: Self-pay | Admitting: Pulmonary Disease

## 2021-11-30 ENCOUNTER — Other Ambulatory Visit: Payer: Self-pay

## 2021-11-30 NOTE — Telephone Encounter (Signed)
Scheduled per 08/03 los, called and spoke with patient's spouse. Patient will be notified of upcoming appointments.

## 2021-12-03 NOTE — Progress Notes (Signed)
The pharmacy team has substituted IV diphenhydramine for IV cetirizine as a premedication. Patient will be monitored for hypersensitivity reaction and adverse reactions to IV cetirizine. Thanks.   Kennith Center, Pharm.D., CPP 12/03/2021@4 :23 PM

## 2021-12-04 NOTE — Progress Notes (Signed)
Pharmacist Chemotherapy Monitoring - Initial Assessment    Anticipated start date: 12/11/21   The following has been reviewed per standard work regarding the patient's treatment regimen: The patient's diagnosis, treatment plan and drug doses, and organ/hematologic function Lab orders and baseline tests specific to treatment regimen  The treatment plan start date, drug sequencing, and pre-medications Prior authorization status  Patient's documented medication list, including drug-drug interaction screen and prescriptions for anti-emetics and supportive care specific to the treatment regimen The drug concentrations, fluid compatibility, administration routes, and timing of the medications to be used The patient's access for treatment and lifetime cumulative dose history, if applicable  The patient's medication allergies and previous infusion related reactions, if applicable   Changes made to treatment plan:  N/A  Follow up needed:  N/A   Larene Beach, RPH, 12/04/2021  4:24 PM

## 2021-12-10 MED FILL — Dexamethasone Sodium Phosphate Inj 100 MG/10ML: INTRAMUSCULAR | Qty: 1 | Status: AC

## 2021-12-10 MED FILL — Fosaprepitant Dimeglumine For IV Infusion 150 MG (Base Eq): INTRAVENOUS | Qty: 5 | Status: AC

## 2021-12-11 ENCOUNTER — Other Ambulatory Visit: Payer: Self-pay

## 2021-12-11 ENCOUNTER — Inpatient Hospital Stay: Payer: Medicare HMO

## 2021-12-11 ENCOUNTER — Other Ambulatory Visit: Payer: Self-pay | Admitting: Medical Oncology

## 2021-12-11 VITALS — BP 138/89 | HR 60 | Temp 98.3°F | Resp 16 | Wt 183.5 lb

## 2021-12-11 DIAGNOSIS — Z5111 Encounter for antineoplastic chemotherapy: Secondary | ICD-10-CM | POA: Diagnosis not present

## 2021-12-11 DIAGNOSIS — C3431 Malignant neoplasm of lower lobe, right bronchus or lung: Secondary | ICD-10-CM | POA: Diagnosis not present

## 2021-12-11 DIAGNOSIS — Z79899 Other long term (current) drug therapy: Secondary | ICD-10-CM | POA: Diagnosis not present

## 2021-12-11 DIAGNOSIS — C342 Malignant neoplasm of middle lobe, bronchus or lung: Secondary | ICD-10-CM | POA: Diagnosis not present

## 2021-12-11 DIAGNOSIS — I4891 Unspecified atrial fibrillation: Secondary | ICD-10-CM | POA: Diagnosis not present

## 2021-12-11 DIAGNOSIS — Z7901 Long term (current) use of anticoagulants: Secondary | ICD-10-CM | POA: Diagnosis not present

## 2021-12-11 DIAGNOSIS — C3491 Malignant neoplasm of unspecified part of right bronchus or lung: Secondary | ICD-10-CM

## 2021-12-11 DIAGNOSIS — C3490 Malignant neoplasm of unspecified part of unspecified bronchus or lung: Secondary | ICD-10-CM

## 2021-12-11 DIAGNOSIS — Z5189 Encounter for other specified aftercare: Secondary | ICD-10-CM | POA: Diagnosis not present

## 2021-12-11 LAB — CBC WITH DIFFERENTIAL (CANCER CENTER ONLY)
Abs Immature Granulocytes: 0.04 10*3/uL (ref 0.00–0.07)
Basophils Absolute: 0.1 10*3/uL (ref 0.0–0.1)
Basophils Relative: 1 %
Eosinophils Absolute: 0.1 10*3/uL (ref 0.0–0.5)
Eosinophils Relative: 1 %
Hemoglobin: 13 g/dL (ref 13.0–17.0)
Immature Granulocytes: 1 %
Lymphocytes Relative: 10 %
Lymphs Abs: 0.6 10*3/uL — ABNORMAL LOW (ref 0.7–4.0)
Monocytes Absolute: 0.6 10*3/uL (ref 0.1–1.0)
Monocytes Relative: 9 %
Neutro Abs: 4.7 10*3/uL (ref 1.7–7.7)
Neutrophils Relative %: 78 %
Platelet Count: 151 10*3/uL (ref 150–400)
WBC Count: 6 10*3/uL (ref 4.0–10.5)

## 2021-12-11 LAB — CMP (CANCER CENTER ONLY)
ALT: 30 U/L (ref 0–44)
AST: 39 U/L (ref 15–41)
Albumin: 3.8 g/dL (ref 3.5–5.0)
Alkaline Phosphatase: 76 U/L (ref 38–126)
Anion gap: 11 (ref 5–15)
BUN: 11 mg/dL (ref 8–23)
CO2: 22 mmol/L (ref 22–32)
Calcium: 9.2 mg/dL (ref 8.9–10.3)
Chloride: 100 mmol/L (ref 98–111)
Creatinine: 0.93 mg/dL (ref 0.61–1.24)
GFR, Estimated: >60 mL/min (ref >60)
Glucose, Bld: 92 mg/dL (ref 70–99)
Potassium: 4.3 mmol/L (ref 3.5–5.1)
Sodium: 133 mmol/L — ABNORMAL LOW (ref 135–145)
Total Bilirubin: 1.3 mg/dL — ABNORMAL HIGH (ref 0.3–1.2)
Total Protein: 6.9 g/dL (ref 6.5–8.1)

## 2021-12-11 MED ORDER — SODIUM CHLORIDE 0.9 % IV SOLN
10.0000 mg | Freq: Once | INTRAVENOUS | Status: AC
  Administered 2021-12-11: 10 mg via INTRAVENOUS
  Filled 2021-12-11: qty 10

## 2021-12-11 MED ORDER — SODIUM CHLORIDE 0.9 % IV SOLN
175.0000 mg/m2 | Freq: Once | INTRAVENOUS | Status: AC
  Administered 2021-12-11: 360 mg via INTRAVENOUS
  Filled 2021-12-11: qty 60

## 2021-12-11 MED ORDER — SODIUM CHLORIDE 0.9 % IV SOLN
515.5000 mg | Freq: Once | INTRAVENOUS | Status: AC
  Administered 2021-12-11: 520 mg via INTRAVENOUS
  Filled 2021-12-11: qty 52

## 2021-12-11 MED ORDER — PROCHLORPERAZINE MALEATE 10 MG PO TABS: 10.0000 mg | ORAL_TABLET | Freq: Four times a day (QID) | ORAL | 0 refills | Status: DC | PRN

## 2021-12-11 MED ORDER — SODIUM CHLORIDE 0.9 % IV SOLN: Freq: Once | INTRAVENOUS | Status: AC

## 2021-12-11 MED ORDER — PALONOSETRON HCL INJECTION 0.25 MG/5ML
0.2500 mg | Freq: Once | INTRAVENOUS | Status: AC
  Administered 2021-12-11: 0.25 mg via INTRAVENOUS

## 2021-12-11 MED ORDER — SODIUM CHLORIDE 0.9 % IV SOLN
350.0000 mg | Freq: Once | INTRAVENOUS | Status: AC
  Administered 2021-12-11: 350 mg via INTRAVENOUS
  Filled 2021-12-11: qty 7

## 2021-12-11 MED ORDER — SODIUM CHLORIDE 0.9 % IV SOLN
150.0000 mg | Freq: Once | INTRAVENOUS | Status: AC
  Administered 2021-12-11: 150 mg via INTRAVENOUS
  Filled 2021-12-11: qty 150

## 2021-12-11 MED ORDER — CETIRIZINE HCL 10 MG/ML IV SOLN
10.0000 mg | Freq: Once | INTRAVENOUS | Status: AC
  Administered 2021-12-11: 10 mg via INTRAVENOUS
  Filled 2021-12-11: qty 1

## 2021-12-11 MED ORDER — FAMOTIDINE IN NACL 20-0.9 MG/50ML-% IV SOLN
20.0000 mg | Freq: Once | INTRAVENOUS | Status: AC
  Administered 2021-12-11: 20 mg via INTRAVENOUS

## 2021-12-11 NOTE — Patient Instructions (Addendum)
[facility]  Discharge Instructions: Thank you for choosing [facility] to provide your oncology and hematology care.   If you have a lab appointment with the [facility], please go directly to the [facility] and check in at the registration area.   Wear comfortable clothing and clothing appropriate for easy access to any Portacath or PICC line.   We strive to give you quality time with your provider. You may need to reschedule your appointment if you arrive late (15 or more minutes).  Arriving late affects you and other patients whose appointments are after yours.  Also, if you miss three or more appointments without notifying the office, you may be dismissed from the clinic at the provider's discretion.      For prescription refill requests, have your pharmacy contact our office and allow 72 hours for refills to be completed.    Today you received the following chemotherapy and/or immunotherapy agents: Libtayo/Paclitaxel/Carboplatin      To help prevent nausea and vomiting after your treatment, we encourage you to take your nausea medication as directed.  BELOW ARE SYMPTOMS THAT SHOULD BE REPORTED IMMEDIATELY: *FEVER GREATER THAN 100.4 F (38 C) OR HIGHER *CHILLS OR SWEATING *NAUSEA AND VOMITING THAT IS NOT CONTROLLED WITH YOUR NAUSEA MEDICATION *UNUSUAL SHORTNESS OF BREATH *UNUSUAL BRUISING OR BLEEDING *URINARY PROBLEMS (pain or burning when urinating, or frequent urination) *BOWEL PROBLEMS (unusual diarrhea, constipation, pain near the anus) TENDERNESS IN MOUTH AND THROAT WITH OR WITHOUT PRESENCE OF ULCERS (sore throat, sores in mouth, or a toothache) UNUSUAL RASH, SWELLING OR PAIN  UNUSUAL VAGINAL DISCHARGE OR ITCHING   Items with * indicate a potential emergency and should be followed up as soon as possible or go to the Emergency Department if any problems should occur.  Please show the CHEMOTHERAPY ALERT CARD or IMMUNOTHERAPY ALERT  CARD at check-in to the Emergency Department and triage nurse.  Should you have questions after your visit or need to cancel or reschedule your appointment, please contact [facility]  Dept: [phone]  and follow the prompts.  Office hours are 8:00 a.m. to 4:30 p.m. Monday - Friday. Please note that voicemails left after 4:00 p.m. may not be returned until the following business day.  We are closed weekends and major holidays. You have access to a nurse at all times for urgent questions. Please call the main number to the clinic Dept: [phone] and follow the prompts.   For any non-urgent questions, you may also contact your provider using MyChart. We now offer e-Visits for anyone 32 and older to request care online for non-urgent symptoms. For details visit [email or web address].   Also download the MyChart app! Go to the app store, search "MyChart", open the app, select [facility], and log in with your MyChart username and password.  Masks are optional in the cancer centers. If you would like for your care team to wear a mask while they are taking care of you, please let them know. You may have one support person who is at least 70 years old accompany you for your appointments.

## 2021-12-13 ENCOUNTER — Other Ambulatory Visit: Payer: Self-pay

## 2021-12-13 ENCOUNTER — Inpatient Hospital Stay: Payer: Medicare HMO

## 2021-12-13 ENCOUNTER — Telehealth: Payer: Self-pay

## 2021-12-13 VITALS — BP 139/83 | HR 79 | Temp 98.0°F | Resp 18

## 2021-12-13 DIAGNOSIS — C3431 Malignant neoplasm of lower lobe, right bronchus or lung: Secondary | ICD-10-CM | POA: Diagnosis not present

## 2021-12-13 DIAGNOSIS — C342 Malignant neoplasm of middle lobe, bronchus or lung: Secondary | ICD-10-CM | POA: Diagnosis not present

## 2021-12-13 DIAGNOSIS — Z5111 Encounter for antineoplastic chemotherapy: Secondary | ICD-10-CM | POA: Diagnosis not present

## 2021-12-13 DIAGNOSIS — Z5189 Encounter for other specified aftercare: Secondary | ICD-10-CM | POA: Diagnosis not present

## 2021-12-13 DIAGNOSIS — C3491 Malignant neoplasm of unspecified part of right bronchus or lung: Secondary | ICD-10-CM

## 2021-12-13 DIAGNOSIS — Z79899 Other long term (current) drug therapy: Secondary | ICD-10-CM | POA: Diagnosis not present

## 2021-12-13 DIAGNOSIS — I4891 Unspecified atrial fibrillation: Secondary | ICD-10-CM | POA: Diagnosis not present

## 2021-12-13 DIAGNOSIS — Z7901 Long term (current) use of anticoagulants: Secondary | ICD-10-CM | POA: Diagnosis not present

## 2021-12-13 LAB — SPECIMEN STATUS REPORT

## 2021-12-13 LAB — PRO B NATRIURETIC PEPTIDE

## 2021-12-13 MED ORDER — PEGFILGRASTIM-CBQV 6 MG/0.6ML ~~LOC~~ SOSY
6.0000 mg | PREFILLED_SYRINGE | Freq: Once | SUBCUTANEOUS | Status: AC
  Administered 2021-12-13: 6 mg via SUBCUTANEOUS
  Filled 2021-12-13: qty 0.6

## 2021-12-13 NOTE — Telephone Encounter (Signed)
-----   Message from Willis Modena, RN sent at 12/11/2021  5:45 PM EDT ----- Regarding: Dr. Julien Nordmann 1st time Libtayo/Taxol/Carbo f/u tol well This Pt is a Dr. Julien Nordmann Pt, it was his 1st time w. Libtayo/Taxol/Carbo. Pt tolerated tx well without incident. Pt is a little worried about his injection on 12/13/21. Please call Pt for f/u tomorrow, 12/12/21.

## 2021-12-13 NOTE — Telephone Encounter (Signed)
Ms Olazabal states that Joe. Cole was nauseous this am and used the compazine with good effect. He is eating, drinking, and urinating well. She knows to call the office at  [phone] if they have any questions or concerns.

## 2021-12-13 NOTE — Patient Instructions (Signed)

## 2021-12-14 ENCOUNTER — Telehealth: Payer: Self-pay

## 2021-12-14 NOTE — Telephone Encounter (Signed)
Pts wife, [name], LVM requesting a return call to discuss the injection pt received.   Pt received Udenyca on yesterday.  I have called her back. She states the pt is c/o body aches. I have advised pt can take Claritin and Tylenol. I have also advised that the pain can last about 2-3 days. Pts wife expressed understanding of this information.

## 2021-12-17 ENCOUNTER — Other Ambulatory Visit: Payer: Self-pay

## 2021-12-17 ENCOUNTER — Encounter: Payer: Self-pay | Admitting: Internal Medicine

## 2021-12-17 DIAGNOSIS — C3491 Malignant neoplasm of unspecified part of right bronchus or lung: Secondary | ICD-10-CM

## 2021-12-18 ENCOUNTER — Telehealth: Payer: Self-pay | Admitting: Medical Oncology

## 2021-12-18 ENCOUNTER — Inpatient Hospital Stay: Payer: Medicare HMO

## 2021-12-18 NOTE — Telephone Encounter (Signed)
Weak , no appetite , almost passed out today in bathroom . Symptoms started last week after his Pegfilgrastim injection.  He is sitting in a chair now and using a cane or walker to go to bathroom.   Claritin and tylenol did not touch it. Lab appt  next Tuesday , provider appt 09/05.  Please advise.

## 2021-12-19 ENCOUNTER — Encounter: Payer: Self-pay | Admitting: Internal Medicine

## 2021-12-19 ENCOUNTER — Telehealth: Payer: Self-pay | Admitting: Medical Oncology

## 2021-12-19 ENCOUNTER — Other Ambulatory Visit: Payer: Self-pay | Admitting: Internal Medicine

## 2021-12-19 MED ORDER — OXYCODONE-ACETAMINOPHEN 5-325 MG PO TABS: 1.0000 | ORAL_TABLET | Freq: Three times a day (TID) | ORAL | 0 refills | Status: DC | PRN

## 2021-12-19 NOTE — Telephone Encounter (Signed)
Pt could not come for labs this week . He will come next Tuesday. I told wife that Dr Julien Nordmann can send in a stronger pain medication for the bone pain . She said to send it to [facility].

## 2021-12-20 ENCOUNTER — Ambulatory Visit (HOSPITAL_COMMUNITY): Payer: Medicare HMO

## 2021-12-20 ENCOUNTER — Encounter (HOSPITAL_COMMUNITY): Payer: Medicare HMO

## 2021-12-25 ENCOUNTER — Other Ambulatory Visit: Payer: Medicare HMO

## 2021-12-25 ENCOUNTER — Ambulatory Visit: Payer: Medicare HMO

## 2021-12-26 ENCOUNTER — Ambulatory Visit: Payer: Medicare HMO | Admitting: Podiatry

## 2021-12-26 ENCOUNTER — Encounter: Payer: Self-pay | Admitting: Podiatry

## 2021-12-26 DIAGNOSIS — M79674 Pain in right toe(s): Secondary | ICD-10-CM | POA: Diagnosis not present

## 2021-12-26 DIAGNOSIS — B351 Tinea unguium: Secondary | ICD-10-CM | POA: Diagnosis not present

## 2021-12-26 DIAGNOSIS — M79675 Pain in left toe(s): Secondary | ICD-10-CM | POA: Diagnosis not present

## 2021-12-26 NOTE — Progress Notes (Signed)
Subjective:   Patient ID: Joe Cole, male   DOB: 71 y.o.   MRN: [id number]   HPI Patient presents very poor health with elongated thickened nailbeds 1-5 both feet that are dystrophic painful and very hard to cut.  He is currently being treated for lung cancer second time and does no smoke at this time and is not active   ROS      Objective:  Physical Exam Vitals and nursing note reviewed.  Constitutional:      Appearance: He is well-developed.  Pulmonary:     Effort: Pulmonary effort is normal.  Musculoskeletal:        General: Normal range of motion.  Skin:    General: Skin is warm.  Neurological:     Mental Status: He is alert.     Vascular status reveals moderately diminished pulses on the right foot over the left with diminished muscle strength range of motion.  Patient has neurological sensation intact but he is in very poor health overall and weak and has severe nail disease 1-5 both feet with thick yellow brittle nailbeds that are painful when pressed and make shoe gear difficult and impossible for him or caregiver to cut.  Good digital perfusion well-oriented     Assessment:  Chronic mycotic nail infection 1-5 both feet with thick yellow brittle nailbeds     Plan:  H&P reviewed condition debrided nailbeds 1-5 both feet no angiogenic bleeding reappoint routine care

## 2021-12-27 ENCOUNTER — Other Ambulatory Visit: Payer: Self-pay

## 2021-12-28 ENCOUNTER — Other Ambulatory Visit: Payer: Self-pay

## 2021-12-28 ENCOUNTER — Ambulatory Visit: Payer: Medicare HMO | Admitting: Vascular Surgery

## 2021-12-28 ENCOUNTER — Inpatient Hospital Stay: Payer: Medicare HMO | Attending: Internal Medicine

## 2021-12-28 DIAGNOSIS — I4891 Unspecified atrial fibrillation: Secondary | ICD-10-CM | POA: Diagnosis not present

## 2021-12-28 DIAGNOSIS — Z7901 Long term (current) use of anticoagulants: Secondary | ICD-10-CM | POA: Diagnosis not present

## 2021-12-28 DIAGNOSIS — C3431 Malignant neoplasm of lower lobe, right bronchus or lung: Secondary | ICD-10-CM | POA: Insufficient documentation

## 2021-12-28 DIAGNOSIS — C3491 Malignant neoplasm of unspecified part of right bronchus or lung: Secondary | ICD-10-CM

## 2021-12-28 DIAGNOSIS — R634 Abnormal weight loss: Secondary | ICD-10-CM | POA: Diagnosis not present

## 2021-12-28 DIAGNOSIS — E860 Dehydration: Secondary | ICD-10-CM | POA: Diagnosis not present

## 2021-12-28 LAB — CBC WITH DIFFERENTIAL (CANCER CENTER ONLY)
Abs Immature Granulocytes: 0.46 10*3/uL — ABNORMAL HIGH (ref 0.00–0.07)
Basophils Absolute: 0.1 10*3/uL (ref 0.0–0.1)
Basophils Relative: 1 %
Eosinophils Absolute: 0 10*3/uL (ref 0.0–0.5)
Eosinophils Relative: 0 %
HCT: 32.8 % — ABNORMAL LOW (ref 39.0–52.0)
Hemoglobin: 12.1 g/dL — ABNORMAL LOW (ref 13.0–17.0)
Immature Granulocytes: 4 %
Lymphocytes Relative: 15 %
Lymphs Abs: 1.6 10*3/uL (ref 0.7–4.0)
MCH: 44.2 pg — ABNORMAL HIGH (ref 26.0–34.0)
MCHC: 36.9 g/dL — ABNORMAL HIGH (ref 30.0–36.0)
MCV: 119.7 fL — ABNORMAL HIGH (ref 80.0–100.0)
Monocytes Absolute: 0.9 10*3/uL (ref 0.1–1.0)
Monocytes Relative: 8 %
Neutro Abs: 7.8 10*3/uL — ABNORMAL HIGH (ref 1.7–7.7)
Neutrophils Relative %: 72 %
Platelet Count: 155 10*3/uL (ref 150–400)
RBC: 2.74 MIL/uL — ABNORMAL LOW (ref 4.22–5.81)
RDW: 14.4 % (ref 11.5–15.5)
WBC Count: 10.9 10*3/uL — ABNORMAL HIGH (ref 4.0–10.5)
nRBC: 0.2 % (ref 0.0–0.2)

## 2021-12-28 LAB — CMP (CANCER CENTER ONLY)
ALT: 12 U/L (ref 0–44)
AST: 16 U/L (ref 15–41)
Albumin: 3.5 g/dL (ref 3.5–5.0)
Alkaline Phosphatase: 88 U/L (ref 38–126)
Anion gap: 9 (ref 5–15)
BUN: 12 mg/dL (ref 8–23)
CO2: 25 mmol/L (ref 22–32)
Calcium: 9 mg/dL (ref 8.9–10.3)
Chloride: 96 mmol/L — ABNORMAL LOW (ref 98–111)
Creatinine: 0.96 mg/dL (ref 0.61–1.24)
GFR, Estimated: >60 mL/min (ref >60)
Glucose, Bld: 89 mg/dL (ref 70–99)
Potassium: 4.9 mmol/L (ref 3.5–5.1)
Sodium: 130 mmol/L — ABNORMAL LOW (ref 135–145)
Total Bilirubin: 0.8 mg/dL (ref 0.3–1.2)
Total Protein: 6.8 g/dL (ref 6.5–8.1)

## 2021-12-31 NOTE — Progress Notes (Deleted)
[place] [facility] Follow up:    [name], MD [address] [place] [address]   DIAGNOSIS: Cancer Staging  Recurrent squamous cell carcinoma of right lung [facility]) Staging form: Lung, AJCC 8th Edition - Clinical: Stage IVA (cT3, cN1, cM1a) - Signed by Curt Bears, MD on 11/29/2021  Stage III squamous cell carcinoma of right lung ([place]) Staging form: Lung, AJCC 8th Edition - Clinical: Stage IIIA (cT3, cN1, cM0) - Signed by Curt Bears, MD on 06/14/2020   SUMMARY OF ONCOLOGIC HISTORY: (Per Dr. Worthy Flank most recent documentation from 11/29/2021)  1) Weekly concurrent chemoradiation with Carboplatin for an AUC of 2 and paclitaxel 45 mg/m2. First dose 11/23/2018. Status post 7 cycles.  2) Consolidation immunotherapy with Imfinzi 10 mg/KG every 2 weeks.  First dose February 08, 2019.  Status post 26 cycles.  CURRENT THERAPY:  INTERVAL HISTORY: Joe Cole 71 y.o. male returns for    Patient Active Problem List   Diagnosis Date Noted   Recurrent squamous cell carcinoma of right lung ([place]) 11/29/2021   Endobronchial cancer, right ([place]) 11/14/2021   Drug-induced hypothyroidism 11/02/2019   Secondary hypercoagulable state ([place]) 10/13/2019   Encounter for antineoplastic immunotherapy 02/01/2019   Medication management 12/17/2018   Leg swelling 11/23/2018   Insomnia 11/23/2018   Encounter for antineoplastic chemotherapy 11/19/2018   Goals of care, counseling/discussion 11/19/2018   Stage III squamous cell carcinoma of right lung ([place]) 11/19/2018   Right lower lobe lung mass    Longstanding persistent atrial fibrillation ([place]) 11/05/2018   Abnormal findings on diagnostic imaging of lung 10/26/2018   Fever and chills 10/26/2018   Shortness of breath 10/19/2018   COPD mixed type ([place]) 10/06/2018   Osteoarthritis of left knee 08/19/2017   Primary osteoarthritis of left knee 07/28/2017   Essential hypertension 07/28/2017   Hyperlipidemia  07/28/2017   Former smoker 07/28/2017    has No Known Allergies.  MEDICAL HISTORY: Past Medical History:  Diagnosis Date   Arthritis    Atrial fibrillation ([place]) 10/2018   COPD with emphysema ([place])    Dyspnea    Hypertension    lung ca dx'd 10/2018    SURGICAL HISTORY: Past Surgical History:  Procedure Laterality Date   BRONCHIAL BIOPSY  11/19/2021   Procedure: BRONCHIAL BIOPSIES;  Surgeon: Garner Nash, DO;  Location: [facility];  Service: Pulmonary;;   BRONCHIAL WASHINGS  11/19/2021   Procedure: BRONCHIAL WASHINGS;  Surgeon: Garner Nash, DO;  Location: [facility];  Service: Pulmonary;;   CARDIOVERSION N/A 01/11/2019   Procedure: CARDIOVERSION;  Surgeon: Josue Hector, MD;  Location: [facility];  Service: Cardiovascular;  Laterality: N/A;   CATARACT EXTRACTION W/ INTRAOCULAR LENS IMPLANT Bilateral    KNEE ARTHROSCOPY     BIL    THORACENTESIS  11/19/2021   Procedure: THORACENTESIS;  Surgeon: Garner Nash, DO;  Location: [facility];  Service: Pulmonary;;   TOE SURGERY     PINNED LEFT BIG TOE   TONSILLECTOMY     T+A  AS CHILD   TOTAL KNEE ARTHROPLASTY Left 08/19/2017   Procedure: TOTAL KNEE ARTHROPLASTY;  Surgeon: Renette Butters, MD;  Location: [facility];  Service: Orthopedics;  Laterality: Left;   VIDEO BRONCHOSCOPY Bilateral 11/05/2018   Procedure: VIDEO BRONCHOSCOPY WITH FLUORO;  Surgeon: [name], MD;  Location: [facility];  Service: Cardiopulmonary;  Laterality: Bilateral;   VIDEO BRONCHOSCOPY Bilateral 11/09/2018   Procedure: VIDEO BRONCHOSCOPY WITH FLUORO;  Surgeon: Rigoberto Noel, MD;  Location:  [facility];  Service: Cardiopulmonary;  Laterality: Bilateral;   VIDEO BRONCHOSCOPY Right 11/19/2021   Procedure: VIDEO BRONCHOSCOPY WITHOUT FLUORO;  Surgeon: Garner Nash, DO;  Location: [facility];  Service: Pulmonary;  Laterality: Right;    SOCIAL HISTORY: Social History   Socioeconomic History   Marital status: Married    Spouse name:  Not on file   Number of children: Not on file   Years of education: Not on file   Highest education level: Not on file  Occupational History   Not on file  Tobacco Use   Smoking status: Former    Packs/day: 1.50    Years: 46.00    Total pack years: 69.00    Types: Cigarettes    Quit date: 09/08/2018    Years since quitting: 3.3   Smokeless tobacco: Never  Vaping Use   Vaping Use: Never used  Substance and Sexual Activity   Alcohol use: Yes    Alcohol/week: 2.0 - 3.0 standard drinks of alcohol    Types: 2 - 3 Shots of liquor per week    Comment: FEW DRINKS AFTER WORK   Drug use: Never   Sexual activity: Not on file  Other Topics Concern   Not on file  Social History Narrative   Not on file   Social Determinants of Health   Financial Resource Strain: Not on file  Food Insecurity: Not on file  Transportation Needs: Not on file  Physical Activity: Not on file  Stress: Not on file  Social Connections: Not on file  Intimate Partner Violence: Not on file    FAMILY HISTORY: Family History  Problem Relation Age of Onset   Atrial fibrillation Mother    Emphysema Father    Pneumonia Father     Review of Systems - Oncology    PHYSICAL EXAMINATION  ECOG PERFORMANCE STATUS: {CHL ONC ECOG JK:[id number]}  There were no vitals filed for this visit.  Physical Exam  LABORATORY DATA:  CBC    Component Value Date/Time   WBC 10.9 (H) 12/28/2021 1307   WBC 5.3 11/09/2018 0556   RBC 2.74 (L) 12/28/2021 1307   HGB 12.1 (L) 12/28/2021 1307   HGB 13.6 11/06/2021 0840   HCT 32.8 (L) 12/28/2021 1307   HCT 37.6 11/06/2021 0840   PLT 155 12/28/2021 1307   PLT 133 (L) 11/06/2021 0840   MCV 119.7 (H) 12/28/2021 1307   MCV 118 (H) 11/06/2021 0840   MCH 44.2 (H) 12/28/2021 1307   MCHC 36.9 (H) 12/28/2021 1307   RDW 14.4 12/28/2021 1307   RDW 14.9 11/06/2021 0840   LYMPHSABS 1.6 12/28/2021 1307   MONOABS 0.9 12/28/2021 1307   EOSABS 0.0 12/28/2021 1307   BASOSABS 0.1  12/28/2021 1307    CMP     Component Value Date/Time   NA 130 (L) 12/28/2021 1307   NA 131 (L) 11/20/2021 0733   K 4.9 12/28/2021 1307   CL 96 (L) 12/28/2021 1307   CO2 25 12/28/2021 1307   GLUCOSE 89 12/28/2021 1307   BUN 12 12/28/2021 1307   BUN 14 11/20/2021 0733   CREATININE 0.96 12/28/2021 1307   CALCIUM 9.0 12/28/2021 1307   PROT 6.8 12/28/2021 1307   PROT 6.6 11/06/2021 0840   ALBUMIN 3.5 12/28/2021 1307   ALBUMIN 3.9 11/06/2021 0840   AST 16 12/28/2021 1307   ALT 12 12/28/2021 1307   ALKPHOS 88 12/28/2021 1307   BILITOT 0.8 12/28/2021 1307   GFRNONAA >60 12/28/2021 1307   GFRAA >  60 01/24/2020 0759       PENDING LABS:   RADIOGRAPHIC STUDIES:  No results found.   PATHOLOGY:     ASSESSMENT and THERAPY PLAN:   No problem-specific Assessment & Plan notes found for this encounter.   No orders of the defined types were placed in this encounter.   All questions were answered. The patient knows to call the clinic with any problems, questions or concerns. We can certainly see the patient much sooner if necessary. This note was electronically signed. Scot Dock, NP 12/31/2021

## 2022-01-01 ENCOUNTER — Inpatient Hospital Stay: Payer: Medicare HMO

## 2022-01-01 ENCOUNTER — Inpatient Hospital Stay: Payer: Medicare HMO | Admitting: Adult Health

## 2022-01-01 ENCOUNTER — Telehealth: Payer: Self-pay

## 2022-01-01 DIAGNOSIS — I10 Essential (primary) hypertension: Secondary | ICD-10-CM | POA: Diagnosis not present

## 2022-01-01 DIAGNOSIS — E039 Hypothyroidism, unspecified: Secondary | ICD-10-CM | POA: Diagnosis not present

## 2022-01-01 DIAGNOSIS — E7800 Pure hypercholesterolemia, unspecified: Secondary | ICD-10-CM | POA: Diagnosis not present

## 2022-01-01 DIAGNOSIS — J449 Chronic obstructive pulmonary disease, unspecified: Secondary | ICD-10-CM | POA: Diagnosis not present

## 2022-01-01 NOTE — Telephone Encounter (Signed)
Pt needing to go to the ER has been confirmed with Dr. Julien Nordmann, since pt cannot present for his appts to be assessed.

## 2022-01-01 NOTE — Telephone Encounter (Signed)
Pts wife, [name], called back restating the aforementioned concerns and advised pt refuses to go to the ER. She denies that pt has any fever, CP, and no increase or change in SOB. She states he has not eaten in 2-3 weeks and drinks only about 16oz of water a day. She is very upset and stated "I am only his wife, I can't make him go". I, again, advised it's best to call EMS so he can be assessed and maybe they can convince him to go to the ER. She again disconnected the call.

## 2022-01-01 NOTE — Telephone Encounter (Signed)
Pts wife, [name], called extremely upset stating the pt "is weak, pale, and cannot move". When attempting to as assessments questions, [name] became increasingly upset and began to scream in the phone stating we (the [facility]) "are killing him" and "he can't do this", "he can't get off the couch". [name] further stated she is aware the pt has appts this morning but he "physically can't do it". I was able to ask if pt has a fever/CP/change in SOB, she denied this.  Given the pt is not able to move at this time and is such a weak state that he is unable to present to the [facility] for an assessment, [name] was advised to call EMS and have pt brought to the ER for an evaluation. [name] expressed frustration and stated "great now we just go lay there for 12 hours". I did attempt to explain that given the pts inability to move or come into the [facility] for an evaluation, EMS can assist him, but [name] disconnected the call.

## 2022-01-03 ENCOUNTER — Inpatient Hospital Stay: Payer: Medicare HMO

## 2022-01-03 ENCOUNTER — Telehealth: Payer: Self-pay

## 2022-01-03 NOTE — Telephone Encounter (Signed)
Called back Mrs.Balcom due to a voicemail that she left me regarding her husband's care.  She is very worried about him because he did not want to come in for treatment on 9/5.  He had a really difficult time with his first treatment and especially the pain from the growth factor following treatment.  She said when they initially met with Dr. Julien Nordmann, she did not know what questions to ask when he asked her if she had any questions. It was overwhelming to her.  She would like to schedule a visit with Dr. Julien Nordmann sooner than 9/26 when his next treatment.  She is also interested in possible palliative care for him as I explained the program to her so she could see Alda Lea as well if possible.  She said her husband is very depressed and he can get up and walk around but doees not want to go anywhere. The pain from his growth factor shot did not last for more than 4 days and he did not even end up taking the pain medicine that Dr. Julien Nordmann called in. She does not want to take him to the ED because he is not that seriously ill but she would like to talk to his oncologist to come up with a better plan.  I informed her that Rubin Payor is out of office and Dr. Worthy Flank schedule is full.  I said we would do our best to fit him in but it may not be with Dr. Julien Nordmann and it may be with another PA or NP. She understands.  She just "wants to be heard and have compassionate care for her husband." Sending message to pod and Dr. Julien Nordmann and I am happy to help coordinate the visit. Gardiner Rhyme, RN

## 2022-01-08 ENCOUNTER — Inpatient Hospital Stay: Payer: Medicare HMO

## 2022-01-08 ENCOUNTER — Other Ambulatory Visit: Payer: Self-pay

## 2022-01-08 ENCOUNTER — Ambulatory Visit: Payer: Medicare HMO

## 2022-01-08 ENCOUNTER — Inpatient Hospital Stay: Payer: Medicare HMO | Admitting: Internal Medicine

## 2022-01-08 VITALS — BP 85/60 | HR 76 | Temp 97.6°F | Resp 20 | Ht 72.0 in | Wt 172.3 lb

## 2022-01-08 DIAGNOSIS — E860 Dehydration: Secondary | ICD-10-CM

## 2022-01-08 DIAGNOSIS — C3491 Malignant neoplasm of unspecified part of right bronchus or lung: Secondary | ICD-10-CM

## 2022-01-08 DIAGNOSIS — I4891 Unspecified atrial fibrillation: Secondary | ICD-10-CM | POA: Diagnosis not present

## 2022-01-08 DIAGNOSIS — C3431 Malignant neoplasm of lower lobe, right bronchus or lung: Secondary | ICD-10-CM | POA: Diagnosis not present

## 2022-01-08 DIAGNOSIS — R634 Abnormal weight loss: Secondary | ICD-10-CM | POA: Diagnosis not present

## 2022-01-08 DIAGNOSIS — Z7901 Long term (current) use of anticoagulants: Secondary | ICD-10-CM | POA: Diagnosis not present

## 2022-01-08 LAB — CMP (CANCER CENTER ONLY)
ALT: 12 U/L (ref 0–44)
AST: 16 U/L (ref 15–41)
Albumin: 3.3 g/dL — ABNORMAL LOW (ref 3.5–5.0)
Alkaline Phosphatase: 73 U/L (ref 38–126)
Anion gap: 9 (ref 5–15)
BUN: 16 mg/dL (ref 8–23)
CO2: 25 mmol/L (ref 22–32)
Calcium: 9.3 mg/dL (ref 8.9–10.3)
Chloride: 98 mmol/L (ref 98–111)
Creatinine: 1 mg/dL (ref 0.61–1.24)
GFR, Estimated: >60 mL/min (ref >60)
Glucose, Bld: 123 mg/dL — ABNORMAL HIGH (ref 70–99)
Potassium: 4.7 mmol/L (ref 3.5–5.1)
Sodium: 132 mmol/L — ABNORMAL LOW (ref 135–145)
Total Bilirubin: 0.5 mg/dL (ref 0.3–1.2)
Total Protein: 6.5 g/dL (ref 6.5–8.1)

## 2022-01-08 LAB — CBC WITH DIFFERENTIAL (CANCER CENTER ONLY)
Abs Immature Granulocytes: 0.14 10*3/uL — ABNORMAL HIGH (ref 0.00–0.07)
Basophils Absolute: 0.1 10*3/uL (ref 0.0–0.1)
Basophils Relative: 2 %
Eosinophils Absolute: 0.1 10*3/uL (ref 0.0–0.5)
Eosinophils Relative: 1 %
HCT: 33.6 % — ABNORMAL LOW (ref 39.0–52.0)
Hemoglobin: 12 g/dL — ABNORMAL LOW (ref 13.0–17.0)
Immature Granulocytes: 2 %
Lymphocytes Relative: 15 %
Lymphs Abs: 1 10*3/uL (ref 0.7–4.0)
MCH: 43.6 pg — ABNORMAL HIGH (ref 26.0–34.0)
MCHC: 35.7 g/dL (ref 30.0–36.0)
MCV: 122.2 fL — ABNORMAL HIGH (ref 80.0–100.0)
Monocytes Absolute: 0.7 10*3/uL (ref 0.1–1.0)
Monocytes Relative: 11 %
Neutro Abs: 4.4 10*3/uL (ref 1.7–7.7)
Neutrophils Relative %: 69 %
Platelet Count: 291 10*3/uL (ref 150–400)
RBC: 2.75 MIL/uL — ABNORMAL LOW (ref 4.22–5.81)
RDW: 13.8 % (ref 11.5–15.5)
WBC Count: 6.5 10*3/uL (ref 4.0–10.5)
nRBC: 0 % (ref 0.0–0.2)

## 2022-01-08 MED ORDER — SODIUM CHLORIDE 0.9 % IV SOLN: INTRAVENOUS | Status: AC

## 2022-01-08 NOTE — Progress Notes (Signed)
[facility] Telephone:([phone]) [phone]   Fax:([phone]) [phone]  OFFICE PROGRESS NOTE  [name], MD [place] [address]  DIAGNOSIS: Recurrent non-small cell lung cancer, squamous cell carcinoma of the right lung initially diagnosed as stage IIIA (T3, N1, M0) non-small cell lung cancer, squamous cell carcinoma diagnosed in July 2020 and presented with right middle lobe mass and right hilar adenopathy with postobstructive pneumonia and suspicious right pleural effusion.  PD-L1 TPS 0%   PRIOR THERAPY:   1) Weekly concurrent chemoradiation with Carboplatin for an AUC of 2 and paclitaxel 45 mg/m2. First dose 11/23/2018. Status post 7 cycles.  2) Consolidation immunotherapy with Imfinzi 10 mg/KG every 2 weeks.  First dose February 08, 2019.  Status post 26 cycles. 3) Systemic chemotherapy with carboplatin for AUC of 5, paclitaxel 175 Mg/M2 and Libtayo (Cempilimab) 350 Mg IV every 3 weeks with Neulasta support.  First dose 12/11/2021.  Status post 1 cycle.  This was discontinued secondary to intolerance.  CURRENT THERAPY: Systemic chemotherapy with carboplatin for AUC of 4 on day 1, gemcitabine 800 Mg/M2 on days 1 and 8 as well as Libtayo (Cempilimab) 350 Mg IV on day 1 every 3 weeks.  First dose 01/22/2022.  INTERVAL HISTORY: Joe Cole 71 y.o. male returns to the clinic today for follow-up visit accompanied by his wife.  The patient has a rough time after the first cycle of his treatment with significant fatigue and weakness as well as lack of appetite and weight loss.  He lost around 12 pounds in the last few weeks.  He was unable to come last week for his treatment.  He continues to have aching pain all over his body in addition to shortness of breath with exertion and mild cough with no hemoptysis.  He has no nausea, vomiting, diarrhea or constipation.  He has no headache or visual changes.  He is here today for evaluation and discussion of his  treatment options.   MEDICAL HISTORY: Past Medical History:  Diagnosis Date   Arthritis    Atrial fibrillation ([place]) 10/2018   COPD with emphysema ([place])    Dyspnea    Hypertension    lung ca dx'd 10/2018    ALLERGIES:  has No Known Allergies.  MEDICATIONS:  Current Outpatient Medications  Medication Sig Dispense Refill   atorvastatin (LIPITOR) 20 MG tablet Take 20 mg by mouth daily.     diltiazem (CARDIZEM CD) 360 MG 24 hr capsule Take 1 capsule (360 mg total) by mouth daily. 90 capsule 3   ELIQUIS 5 MG TABS tablet TAKE ONE TABLET BY MOUTH TWICE DAILY 180 tablet 1   furosemide (LASIX) 20 MG tablet Take 1 tablet (20 mg total) by mouth daily as needed. (Patient taking differently: Take 20 mg by mouth daily.) 90 tablet 3   levothyroxine (SYNTHROID) 88 MCG tablet Take 88 mcg by mouth daily.     metoprolol succinate (TOPROL-XL) 100 MG 24 hr tablet TAKE ONE TABLET BY MOUTH ONCE DAILY WITH OR immediately following A meal (Patient taking differently: Take 100 mg by mouth every evening.) 90 tablet 0   oxyCODONE-acetaminophen (PERCOCET/ROXICET) 5-325 MG tablet Take 1 tablet by mouth every 8 (eight) hours as needed for severe pain. 30 tablet 0   potassium chloride (KLOR-CON M) 10 MEQ tablet Take 1 tablet (10 mEq total) by mouth daily as needed. (Patient taking differently: Take 10 mEq by mouth daily.) 90 tablet 3   prochlorperazine (COMPAZINE)  10 MG tablet Take 1 tablet (10 mg total) by mouth every 6 (six) hours as needed for nausea or vomiting. 30 tablet 0   traZODone (DESYREL) 50 MG tablet TAKE 1 TABLET BY MOUTH EVERYDAY AT BEDTIME (Patient taking differently: Take 50 mg by mouth at bedtime as needed for sleep.) 90 tablet 1   TRELEGY ELLIPTA 100-62.5-25 MCG/ACT AEPB INHALE 1 PUFF BY MOUTH INTO LUNGS DAILY in THE afternoon 60 each 5   No current facility-administered medications for this visit.    SURGICAL HISTORY:  Past Surgical History:  Procedure Laterality Date   BRONCHIAL BIOPSY   11/19/2021   Procedure: BRONCHIAL BIOPSIES;  Surgeon: Garner Nash, DO;  Location: [facility];  Service: Pulmonary;;   BRONCHIAL WASHINGS  11/19/2021   Procedure: BRONCHIAL WASHINGS;  Surgeon: Garner Nash, DO;  Location: [facility];  Service: Pulmonary;;   CARDIOVERSION N/A 01/11/2019   Procedure: CARDIOVERSION;  Surgeon: Josue Hector, MD;  Location: [facility];  Service: Cardiovascular;  Laterality: N/A;   CATARACT EXTRACTION W/ INTRAOCULAR LENS IMPLANT Bilateral    KNEE ARTHROSCOPY     BIL    THORACENTESIS  11/19/2021   Procedure: THORACENTESIS;  Surgeon: Garner Nash, DO;  Location: [facility];  Service: Pulmonary;;   TOE SURGERY     PINNED LEFT BIG TOE   TONSILLECTOMY     T+A  AS CHILD   TOTAL KNEE ARTHROPLASTY Left 08/19/2017   Procedure: TOTAL KNEE ARTHROPLASTY;  Surgeon: Renette Butters, MD;  Location: [facility];  Service: Orthopedics;  Laterality: Left;   VIDEO BRONCHOSCOPY Bilateral 11/05/2018   Procedure: VIDEO BRONCHOSCOPY WITH FLUORO;  Surgeon: [name], MD;  Location: [facility];  Service: Cardiopulmonary;  Laterality: Bilateral;   VIDEO BRONCHOSCOPY Bilateral 11/09/2018   Procedure: VIDEO BRONCHOSCOPY WITH FLUORO;  Surgeon: Rigoberto Noel, MD;  Location: [facility];  Service: Cardiopulmonary;  Laterality: Bilateral;   VIDEO BRONCHOSCOPY Right 11/19/2021   Procedure: VIDEO BRONCHOSCOPY WITHOUT FLUORO;  Surgeon: Garner Nash, DO;  Location: [facility];  Service: Pulmonary;  Laterality: Right;    REVIEW OF SYSTEMS:  Constitutional: positive for anorexia, fatigue, and weight loss Eyes: negative Ears, nose, mouth, throat, and face: negative Respiratory: positive for cough and dyspnea on exertion Cardiovascular: negative Gastrointestinal: negative Genitourinary:negative Integument/breast: negative Hematologic/lymphatic: negative Musculoskeletal:positive for arthralgias and muscle weakness Neurological: negative Behavioral/Psych:  negative Endocrine: negative Allergic/Immunologic: negative   PHYSICAL EXAMINATION: General appearance: alert, cooperative, fatigued, and no distress Head: Normocephalic, without obvious abnormality, atraumatic Neck: no adenopathy, no JVD, supple, symmetrical, trachea midline, and thyroid not enlarged, symmetric, no tenderness/mass/nodules Lymph nodes: Cervical, supraclavicular, and axillary nodes normal. Resp: diminished breath sounds RLL and dullness to percussion RLL Back: symmetric, no curvature. ROM normal. No CVA tenderness. Cardio: regular rate and rhythm, S1, S2 normal, no murmur, click, rub or gallop GI: soft, non-tender; bowel sounds normal; no masses,  no organomegaly Extremities: edema 1+ edema right lower extremity Neurologic: Alert and oriented X 3, normal strength and tone. Normal symmetric reflexes. Normal coordination and gait  ECOG PERFORMANCE STATUS: 1 - Symptomatic but completely ambulatory  Blood pressure (!) 85/60, pulse 76, temperature 97.6 F (36.4 C), temperature source Oral, resp. rate 20, height 6' (1.829 m), weight 172 lb 4.8 oz (78.2 kg).  LABORATORY DATA: Lab Results  Component Value Date   WBC 10.9 (H) 12/28/2021   HGB 12.1 (L) 12/28/2021   HCT 32.8 (L) 12/28/2021   MCV 119.7 (H) 12/28/2021   PLT 155 12/28/2021  Chemistry      Component Value Date/Time   NA 130 (L) 12/28/2021 1307   NA 131 (L) 11/20/2021 0733   K 4.9 12/28/2021 1307   CL 96 (L) 12/28/2021 1307   CO2 25 12/28/2021 1307   BUN 12 12/28/2021 1307   BUN 14 11/20/2021 0733   CREATININE 0.96 12/28/2021 1307      Component Value Date/Time   CALCIUM 9.0 12/28/2021 1307   ALKPHOS 88 12/28/2021 1307   AST 16 12/28/2021 1307   ALT 12 12/28/2021 1307   BILITOT 0.8 12/28/2021 1307       RADIOGRAPHIC STUDIES: No results found.  ASSESSMENT AND PLAN: This is a very pleasant 71 years old white male with stage IIIA non-small cell lung cancer, squamous cell carcinoma.  The patient  underwent a course of concurrent chemoradiation with weekly carboplatin and paclitaxel status post 7 cycles.  The patient tolerated his treatment well except for mild fatigue and odynophagia. The patient completed a course of consolidation treatment with immunotherapy with Imfinzi every 2 weeks status post 26 cycles.  He tolerated his treatment well with no concerning adverse effects. The patient has been on observation since that time.  He had a PET scan in January 2023 that showed no hypermetabolic activity in the central right lung/right hilum to suggest local disease recurrence.  He has no other evidence of metastatic disease.  He also underwent ultrasound-guided right thoracentesis on May 28 2021 with drainage of 1.2 L of clear yellow fluid.  The cytology showed reactive mesothelial cells. The patient had repeat CT scan of the chest performed recently.  I personally and independently reviewed the scan images and discussed the results and showed the images to the patient and his wife.  His scan showed progressive right middle lobe and right lower lobe atelectasis with near complete occlusion of the bronchus intermedius and only a small amount of residual aerated right middle lobe and right lower lobe. The patient underwent repeat bronchoscopy and the final pathology was consistent with recurrent squamous cell carcinoma of the right lung.  Ultrasound-guided right thoracentesis showed no evidence of malignant cells and the pleural fluid. The patient started systemic chemotherapy with carboplatin for AUC of 5, paclitaxel 175 Mg/M2 and Libtayo (Cempilimab) 350 Mg IV every 3 weeks with Neulasta support.  Status post 1 cycle.  He had rough time with this treatment and has been staying home with no ability to do much in addition to lack of appetite and weight loss.  The patient and his wife indicated that he would not be able to continue with this treatment in the future because of the significant decline in  his condition. I had a lengthy discussion with the patient and his wife about his current condition and treatment options. I given the option of palliative care and hospice referral or consideration of adjusting his systemic treatment with chemotherapy and replacing paclitaxel with gemcitabine to avoid the Neulasta injection and also to have an easier regimen for him.  The patient and his wife will think about this option but if they decide to to proceed with the treatment, he will start systemic chemotherapy with carboplatin for AUC of 4 on day 1, gemcitabine 800 Mg/M2 on days 1 and 8 as well as Libtayo (Cempilimab) 350 Mg IV on day 1 every 3 weeks.  He is expected to start the first dose of this treatment on 01/22/2022. For the dehydration and hypotension, I will arrange for the patient to receive 1 L  of normal saline in the clinic today. For the weight loss, I will start him on Megace 625 mg p.o. daily. For the history of atrial fibrillation, he will continue his current treatment with Eliquis. The patient will come back for follow-up visit for evaluation with the first dose of his treatment. He was advised to call immediately if he has any other concerning symptoms in the interval. The patient voices understanding of current disease status and treatment options and is in agreement with the current care plan. All questions were answered. The patient knows to call the clinic with any problems, questions or concerns. We can certainly see the patient much sooner if necessary. The total time spent in the appointment was 55 minutes.  Disclaimer: This note was dictated with voice recognition software. Similar sounding words can inadvertently be transcribed and may not be corrected upon review.

## 2022-01-08 NOTE — Patient Instructions (Signed)
Rehydration, Elderly Rehydration is the replacement of body fluids, salts, and minerals (electrolytes) that are lost during dehydration. Dehydration is when there is not enough water or other fluids in the body. This happens when you lose more fluids than you take in. People who are age 70 or older have a higher risk of dehydration than younger adults. Common causes of dehydration include: Conditions that cause loss of water or other fluids, such as diarrhea, vomiting, sweating, or urinating a lot. Not drinking enough fluids. This can occur when you are ill or doing activities that require a lot of energy, especially in hot weather. Other illnesses and conditions, such as fever or infection. Certain medicines, such as those that remove excess fluid from the body (diuretics). Not being able to get enough water and food. Symptoms of mild or moderate dehydration may include thirst, dry lips and mouth, and dizziness. Symptoms of severe dehydration may include increased heart rate, confusion, fainting, and not urinating. For severe dehydration, you may need to get fluids through an IV at the hospital. For mild or moderate dehydration, you can usually rehydrate at home by drinking certain fluids as told by your health care provider. What are the risks? Generally, rehydration is safe. However, taking in too much fluid (overhydration) can be a problem. This is rare. Overhydration can cause an electrolyte imbalance, kidney failure, fluid in the lungs, or a decrease in salt (sodium) levels in the body. Supplies needed: You will need an oral rehydration solution (ORS) if your health care provider tells you to use one. This is a drink designed to treat dehydration. It can be found in pharmacies and retail stores. How to rehydrate Fluids Follow instructions from your health care provider for rehydration. The kind of fluid and the amount you should drink depend on your condition. In general, for mild dehydration,  you should choose drinks that you prefer. If told by your health care provider, drink an ORS. Make an ORS by following instructions on the package. Start by drinking small amounts, about  cup (120 mL) every 5-10 minutes. Slowly increase how much you drink until you have taken the amount recommended by your health care provider. Drink enough fluids to keep your urine pale yellow. If you were told to drink an ORS, finish the ORS first, then start slowly drinking other clear fluids. Drink fluids such as: Water. This includes sparkling water and flavored water. Drinking only waterwhile rehydrating can lead to having too little sodium in your body (hyponatremia). Follow instructions from your health care provider. Water from ice chips you suck on. Fruit juice with water you add to it(diluted). Sports drinks. Hot or cold herbal teas. Broth-based soups. Coffee. Milk or milk products. Food Follow instructions from your health care provider about what to eat while you rehydrate. Your health care provider may recommend that you slowly begin eating regular foods in small amounts. Eat foods that contain a healthy balance of electrolytes, such as bananas, oranges, potatoes, tomatoes, and spinach. Avoid foods that are greasy or contain a lot of sugar. In some cases, you may get nutrition through a feeding tube that is passed through your nose and into your stomach (nasogastric tube, or NG tube). This may be done if you have uncontrolled vomiting or diarrhea. Beverages to avoid  Certain beverages may make dehydration worse. While you rehydrate, avoid drinking alcohol. How to tell if you are recovering from dehydration You may be recovering from dehydration if: You are urinating more often than before  you started rehydrating. Your urine is pale yellow. Your energy level improves. You vomit less frequently. You have diarrhea less frequently. Your appetite improves or returns to normal. You feel less  dizzy or less light-headed. Your skin tone and color start to look more normal. Follow these instructions at home: Take over-the-counter and prescription medicines only as told by your health care provider. Do not take sodium tablets. Doing this can lead to having too much sodium in your body (hypernatremia). Contact a health care provider if: You continue to have symptoms of mild or moderate dehydration, such as: Thirst. Dry lips. Slightly dry mouth. Dizziness. Dark urine or less urine than usual. Muscle cramps. You continue to vomit or have diarrhea. Get help right away if you: Have symptoms of dehydration that get worse. Have a fever. Have a severe headache. Have been vomiting and the following happens: Your vomiting gets worse. Your vomit includes blood or green matter (bile). You cannot eat or drink without vomiting. Have problems with urination or bowel movements, such as: Diarrhea that gets worse. Blood in your stool (feces). This may cause stool to look black and tarry. Not urinating, or urinating only a small amount of very dark urine, within 6-8 hours. Have trouble breathing. Have symptoms that get worse with treatment. These symptoms may represent a serious problem that is an emergency. Do not wait to see if the symptoms will go away. Get medical help right away. Call your local emergency services (911 in the [place] Do not drive yourself to the hospital. Summary Rehydration is the replacement of body fluids, salts, and minerals (electrolytes) that are lost during dehydration. Follow instructions from your health care provider for rehydration. The kind of fluid and the amount you should drink depend on your condition. Slowly increase how much you drink until you have taken the amount recommended by your health care provider. Contact your health care provider if you continue to show signs of mild or moderate dehydration. This information is not intended to replace advice  given to you by your health care provider. Make sure you discuss any questions you have with your health care provider. Document Revised: 06/16/2019 Document Reviewed: 06/03/2019 Elsevier Patient Education  [place].

## 2022-01-09 ENCOUNTER — Telehealth: Payer: Self-pay

## 2022-01-09 NOTE — Telephone Encounter (Signed)
Message received from Tora Kindred, RN from pts wife, Joe Cole. She wanted to know the following:  Can chemo dose be decreased Does pt still require weekly labs? Does he need to keep his appt on 01/22/22?  Per Dr. Julien Nordmann:  Can chemo dose be decreased? He has already decreased and adjusted the treatment.  Does pt still require weekly labs? Yes, pt still needs weekly labs  Does he need to keep his appt on 01/22/22? Yes, pt still needs to keep this appt.  I have called pts wife back and advised as indicated. She expressed understanding of this information.

## 2022-01-15 ENCOUNTER — Ambulatory Visit: Payer: Medicare HMO

## 2022-01-15 ENCOUNTER — Inpatient Hospital Stay: Payer: Medicare HMO

## 2022-01-18 ENCOUNTER — Other Ambulatory Visit: Payer: Self-pay

## 2022-01-18 ENCOUNTER — Telehealth: Payer: Self-pay | Admitting: *Deleted

## 2022-01-18 DIAGNOSIS — C3491 Malignant neoplasm of unspecified part of right bronchus or lung: Secondary | ICD-10-CM

## 2022-01-18 NOTE — Telephone Encounter (Signed)
Contacted patient's wife following phone message requesting referral to hospice for her husband.She stated he did not want to have further treatment and that Dr. Julien Nordmann had given him the option of hospice care during last appt.   Informed her that a referral could be made to hospice today is that is their choice. She stated yes. Informed her that following referral, hospice will contact them within 24 hours to arrange time for an intake evaluation. Confirmed the preferred contact phone # with Ms. Sheerin ( her mobile) that will be given to hospice.   Informed her that since hospice will be providing her husband's care, his future appts here at [facility] will be cancelled so that they will not have the added burden of keeping up with those.   Ms. Sharpley acknowledged all information given.

## 2022-01-18 NOTE — Progress Notes (Signed)
I spoke with Joe Cole at [facility] to notify of this referral.

## 2022-01-21 ENCOUNTER — Ambulatory Visit: Payer: Medicare HMO | Admitting: Pulmonary Disease

## 2022-01-22 ENCOUNTER — Inpatient Hospital Stay: Payer: Medicare HMO

## 2022-01-22 ENCOUNTER — Inpatient Hospital Stay: Payer: Medicare HMO | Admitting: Internal Medicine

## 2022-01-22 ENCOUNTER — Telehealth: Payer: Self-pay | Admitting: Pulmonary Disease

## 2022-01-24 ENCOUNTER — Ambulatory Visit: Payer: Medicare HMO

## 2022-01-25 NOTE — Telephone Encounter (Signed)
Spoke with [name] who wanted to let Dr. Halford Chessman pt is now under care of hospice. Nothing further needed at this time.   Routing to Dr. Halford Chessman as Juluis Rainier.

## 2022-01-29 ENCOUNTER — Other Ambulatory Visit: Payer: Medicare HMO

## 2022-01-29 ENCOUNTER — Ambulatory Visit: Payer: Medicare HMO | Admitting: Internal Medicine

## 2022-01-29 ENCOUNTER — Ambulatory Visit: Payer: Medicare HMO

## 2022-01-30 ENCOUNTER — Other Ambulatory Visit: Payer: Self-pay | Admitting: Cardiology

## 2022-02-05 ENCOUNTER — Ambulatory Visit: Payer: Medicare HMO

## 2022-02-05 ENCOUNTER — Other Ambulatory Visit: Payer: Medicare HMO

## 2022-02-07 ENCOUNTER — Other Ambulatory Visit: Payer: Medicare HMO

## 2022-02-11 ENCOUNTER — Ambulatory Visit: Payer: Medicare HMO | Admitting: Internal Medicine

## 2022-02-12 ENCOUNTER — Ambulatory Visit: Payer: Medicare HMO | Admitting: Physician Assistant

## 2022-02-12 ENCOUNTER — Ambulatory Visit: Payer: Medicare HMO

## 2022-02-12 ENCOUNTER — Other Ambulatory Visit: Payer: Medicare HMO

## 2022-02-14 ENCOUNTER — Ambulatory Visit: Payer: Medicare HMO

## 2022-02-19 ENCOUNTER — Other Ambulatory Visit: Payer: Medicare HMO

## 2022-02-19 ENCOUNTER — Ambulatory Visit: Payer: Medicare HMO

## 2022-02-21 ENCOUNTER — Ambulatory Visit: Payer: Medicare HMO

## 2022-02-25 ENCOUNTER — Other Ambulatory Visit: Payer: Self-pay | Admitting: Vascular Surgery

## 2022-02-25 DIAGNOSIS — I890 Lymphedema, not elsewhere classified: Secondary | ICD-10-CM

## 2022-02-25 DIAGNOSIS — I739 Peripheral vascular disease, unspecified: Secondary | ICD-10-CM

## 2022-03-04 DIAGNOSIS — I10 Essential (primary) hypertension: Secondary | ICD-10-CM | POA: Diagnosis not present

## 2022-03-04 DIAGNOSIS — E785 Hyperlipidemia, unspecified: Secondary | ICD-10-CM | POA: Diagnosis not present

## 2022-03-04 DIAGNOSIS — E039 Hypothyroidism, unspecified: Secondary | ICD-10-CM | POA: Diagnosis not present

## 2022-03-04 DIAGNOSIS — J449 Chronic obstructive pulmonary disease, unspecified: Secondary | ICD-10-CM | POA: Diagnosis not present

## 2022-03-05 ENCOUNTER — Other Ambulatory Visit: Payer: Medicare HMO

## 2022-03-05 ENCOUNTER — Ambulatory Visit: Payer: Medicare HMO | Admitting: Physician Assistant

## 2022-03-05 ENCOUNTER — Ambulatory Visit: Payer: Medicare HMO

## 2022-03-11 MED FILL — Dexamethasone Sodium Phosphate Inj 100 MG/10ML: INTRAMUSCULAR | Qty: 1 | Status: AC

## 2022-03-11 MED FILL — Fosaprepitant Dimeglumine For IV Infusion 150 MG (Base Eq): INTRAVENOUS | Qty: 5 | Status: AC

## 2022-03-12 ENCOUNTER — Inpatient Hospital Stay: Payer: Medicare HMO

## 2022-03-25 ENCOUNTER — Ambulatory Visit: Payer: Medicare HMO | Admitting: Cardiology

## 2022-03-25 NOTE — Progress Notes (Incomplete)
Cardiology Office Note:    Date:  03/25/2022   ID:  Joe Cole, DOB Oct 16, 1950, MRN [id number]  PCP:  [name], MD  [facility] Cardiologist:  Candee Furbish, MD  [facility] Electrophysiologist:  None   Referring MD: [name], MD     History of Present Illness:    Joe Cole is a 71 y.o. male here for the follow-up of permanent atrial fibrillation. Has COPD emphysema, tobacco use.  Prior cardioversion 01/12/2019 then had return of atrial fibrillation in 2 days.  Only felt minimal improvement with cardioversion.  He has been under good control with longstanding persistent at this point.  Overall, he does feel shortness of breath with activity mostly from his COPD.  His lung cancer therapy has been stable.  Notes reviewed from Dr. Earlie Server.  No bleeding.  See below for details.  No fevers chills nausea vomiting syncope bleeding.  Past Medical History:  Diagnosis Date   Arthritis    Atrial fibrillation ([place]) 10/2018   COPD with emphysema ([place])    Dyspnea    Hypertension    lung ca dx'd 10/2018    Past Surgical History:  Procedure Laterality Date   BRONCHIAL BIOPSY  11/19/2021   Procedure: BRONCHIAL BIOPSIES;  Surgeon: Garner Nash, DO;  Location: [place] [facility];  Service: Pulmonary;;   BRONCHIAL WASHINGS  11/19/2021   Procedure: BRONCHIAL WASHINGS;  Surgeon: Garner Nash, DO;  Location: [place];  Service: Pulmonary;;   CARDIOVERSION N/A 01/11/2019   Procedure: CARDIOVERSION;  Surgeon: Josue Hector, MD;  Location: [place] [facility];  Service: Cardiovascular;  Laterality: N/A;   CATARACT EXTRACTION W/ INTRAOCULAR LENS IMPLANT Bilateral    KNEE ARTHROSCOPY     BIL    THORACENTESIS  11/19/2021   Procedure: THORACENTESIS;  Surgeon: Garner Nash, DO;  Location: [place] [facility];  Service: Pulmonary;;   TOE SURGERY     PINNED LEFT BIG TOE   TONSILLECTOMY     T+A  AS CHILD   TOTAL KNEE ARTHROPLASTY Left 08/19/2017   Procedure: TOTAL KNEE  ARTHROPLASTY;  Surgeon: Renette Butters, MD;  Location: [place];  Service: Orthopedics;  Laterality: Left;   VIDEO BRONCHOSCOPY Bilateral 11/05/2018   Procedure: VIDEO BRONCHOSCOPY WITH FLUORO;  Surgeon: [name], MD;  Location: [place];  Service: Cardiopulmonary;  Laterality: Bilateral;   VIDEO BRONCHOSCOPY Bilateral 11/09/2018   Procedure: VIDEO BRONCHOSCOPY WITH FLUORO;  Surgeon: Rigoberto Noel, MD;  Location: [place];  Service: Cardiopulmonary;  Laterality: Bilateral;   VIDEO BRONCHOSCOPY Right 11/19/2021   Procedure: VIDEO BRONCHOSCOPY WITHOUT FLUORO;  Surgeon: Garner Nash, DO;  Location: [place];  Service: Pulmonary;  Laterality: Right;    Current Medications: No outpatient medications have been marked as taking for the 03/25/22 encounter (Appointment) with Jerline Pain, MD.     Allergies:   Patient has no known allergies.   Social History   Socioeconomic History   Marital status: Married    Spouse name: Not on file   Number of children: Not on file   Years of education: Not on file   Highest education level: Not on file  Occupational History   Not on file  Tobacco Use   Smoking status: Former    Packs/day: 1.50    Years: 46.00    Total pack years: 69.00    Types: Cigarettes    Quit date: 09/08/2018    Years since quitting: 3.5   Smokeless tobacco: Never  Vaping Use   Vaping  Use: Never used  Substance and Sexual Activity   Alcohol use: Yes    Alcohol/week: 2.0 - 3.0 standard drinks of alcohol    Types: 2 - 3 Shots of liquor per week    Comment: FEW DRINKS AFTER WORK   Drug use: Never   Sexual activity: Not on file  Other Topics Concern   Not on file  Social History Narrative   Not on file   Social Determinants of Health   Financial Resource Strain: Not on file  Food Insecurity: Not on file  Transportation Needs: Not on file  Physical Activity: Not on file  Stress: Not on file  Social Connections: Not on file     Family  History: The patient's family history includes Atrial fibrillation in his mother; Emphysema in his father; Pneumonia in his father.  ROS:   Please see the history of present illness.     All other systems reviewed and are negative.  EKGs/Labs/Other Studies Reviewed:    The following studies were reviewed today:  Echo 11/06/18 demonstrated  1. The left ventricle has low normal systolic function, with an ejection fraction of 50-55%. The cavity size was mildly dilated. Left ventricular diastolic Doppler parameters are indeterminate.  2. Extremely poor acoustic windows limit study.  3. The right ventricle was not well visualized. The cavity was moderately enlarged. There is not assessed.  4. RV is not well seen RVEF is at least mildly depressed.  5. Left atrial size was severely dilated.  6. Right atrial size was severely dilated.  7. The mitral valve is abnormal. Mild thickening of the mitral valve leaflet. There is mild mitral annular calcification present.  8. The tricuspid valve is grossly normal.  9. The aortic valve is abnormal. Mild thickening of the aortic valve. Mild calcification of the aortic valve. Aortic valve regurgitation is trivial by color flow Doppler. 10. The inferior vena cava was dilated in size with <50% respiratory variability.    EKG: Previous EKG showed atrial fibrillation rate controlled.  Recent Labs: 11/06/2021: TSH 6.050 11/20/2021: NT-Pro BNP CANCELED 01/08/2022: ALT 12; BUN 16; Creatinine 1.00; Hemoglobin 12.0; Platelet Count 291; Potassium 4.7; Sodium 132  Recent Lipid Panel    Component Value Date/Time   CHOL 142 11/06/2021 0840   TRIG 70 11/06/2021 0840   HDL 73 11/06/2021 0840   CHOLHDL 1.9 11/06/2021 0840   LDLCALC 55 11/06/2021 0840     Physical Exam:    VS:  There were no vitals taken for this visit.    Wt Readings from Last 3 Encounters:  01/08/22 172 lb 4.8 oz (78.2 kg)  12/11/21 183 lb 8 oz (83.2 kg)  11/29/21 184 lb 1.6 oz (83.5 kg)      GEN:  Well nourished, well developed in no acute distress HEENT: Normal NECK: No JVD; No carotid bruits LYMPHATICS: No lymphadenopathy CARDIAC: irreg, no murmurs, rubs, gallops RESPIRATORY: Subtle wheezes heard today on exam. ABDOMEN: Soft, non-tender, non-distended MUSCULOSKELETAL:  No edema; No deformity  SKIN: Warm and dry NEUROLOGIC:  Alert and oriented x 3 PSYCHIATRIC:  Normal affect   ASSESSMENT:    No diagnosis found.  PLAN:    In order of problems listed above:  Longstanding persistent atrial fibrillation -Tried cardioversion but failed.  2020.  Has severe biatrial enlargement.  Minimal improvement clinically after cardioversion. -Continue with rate control. -Continue with Eliquis diltiazem and Toprol.  Chronic anticoagulation -CHA2DS2-VASc 2.  Continue with Eliquis.  Remember, wife's main concern was possibility of stroke.  His father had stroke on aspirin with atrial fibrillation.  Continue to monitor hemoglobin and creatinine.  COPD/lung cancer, stage III squamous cell carcinoma -Dr. Lew Dawes notes have been reviewed.  Currently stable. Finished immunotherapy. Stable.  --SOB mostly from COPD.  Subtle wheezes heard today on exam.  Essential hypertension -Well controlled.  No changes made.  LDL 87 hemoglobin 13.7 creatinine 0.86 ALT 23  Medication Adjustments/Labs and Tests Ordered: Current medicines are reviewed at length with the patient today.  Concerns regarding medicines are outlined above.  No orders of the defined types were placed in this encounter.  No orders of the defined types were placed in this encounter.   There are no Patient Instructions on file for this visit.   Gregor Hams  03/25/2022 8:07 AM    [place] as a scribe for [employer], MD.,have documented all relevant documentation on the behalf of Candee Furbish, MD,as directed by  Candee Furbish, MD while in the presence of  Candee Furbish, MD.  ***

## 2022-04-01 DIAGNOSIS — I10 Essential (primary) hypertension: Secondary | ICD-10-CM | POA: Diagnosis not present

## 2022-04-01 DIAGNOSIS — E039 Hypothyroidism, unspecified: Secondary | ICD-10-CM | POA: Diagnosis not present

## 2022-04-01 DIAGNOSIS — E785 Hyperlipidemia, unspecified: Secondary | ICD-10-CM | POA: Diagnosis not present

## 2022-04-01 DIAGNOSIS — J449 Chronic obstructive pulmonary disease, unspecified: Secondary | ICD-10-CM | POA: Diagnosis not present

## 2022-04-23 ENCOUNTER — Encounter: Payer: Self-pay | Admitting: Podiatry

## 2022-04-24 ENCOUNTER — Ambulatory Visit: Payer: Medicare HMO | Admitting: Podiatry

## 2022-04-30 ENCOUNTER — Other Ambulatory Visit: Payer: Self-pay | Admitting: Cardiology

## 2022-04-30 DIAGNOSIS — I4891 Unspecified atrial fibrillation: Secondary | ICD-10-CM

## 2022-04-30 NOTE — Telephone Encounter (Signed)
Prescription refill request for Eliquis received. Indication:afib Last office visit:7/23 Scr:1.0 Age: 72 Weight:78.2  kg  Prescription refilled

## 2022-05-01 DIAGNOSIS — J449 Chronic obstructive pulmonary disease, unspecified: Secondary | ICD-10-CM | POA: Diagnosis not present

## 2022-05-01 DIAGNOSIS — I10 Essential (primary) hypertension: Secondary | ICD-10-CM | POA: Diagnosis not present

## 2022-05-01 DIAGNOSIS — E785 Hyperlipidemia, unspecified: Secondary | ICD-10-CM | POA: Diagnosis not present

## 2022-05-01 DIAGNOSIS — E039 Hypothyroidism, unspecified: Secondary | ICD-10-CM | POA: Diagnosis not present

## 2022-06-30 ENCOUNTER — Other Ambulatory Visit: Payer: Self-pay | Admitting: Cardiology

## 2022-07-30 ENCOUNTER — Other Ambulatory Visit: Payer: Self-pay | Admitting: Cardiology

## 2022-09-25 ENCOUNTER — Other Ambulatory Visit: Payer: Self-pay | Admitting: Physician Assistant

## 2022-10-02 ENCOUNTER — Other Ambulatory Visit: Payer: Self-pay | Admitting: Physician Assistant

## 2022-10-27 ENCOUNTER — Other Ambulatory Visit: Payer: Self-pay | Admitting: Cardiology

## 2022-11-26 ENCOUNTER — Other Ambulatory Visit: Payer: Self-pay | Admitting: Cardiology

## 2022-12-04 ENCOUNTER — Other Ambulatory Visit: Payer: Self-pay | Admitting: Cardiology

## 2022-12-29 DEATH — deceased

## 2023-04-15 NOTE — Telephone Encounter (Signed)
This encounter was created in error - please disregard.
